# Patient Record
Sex: Female | Born: 1940 | Race: White | Hispanic: No | Smoking: Former smoker
Health system: Southern US, Community
[De-identification: ages and names within clinical notes are randomized; demographics above are authoritative.]

## PROBLEM LIST (undated history)

## (undated) DIAGNOSIS — K52831 Collagenous colitis: Secondary | ICD-10-CM

## (undated) DIAGNOSIS — I10 Essential (primary) hypertension: Secondary | ICD-10-CM

## (undated) DIAGNOSIS — B029 Zoster without complications: Secondary | ICD-10-CM

## (undated) DIAGNOSIS — K5790 Diverticulosis of intestine, part unspecified, without perforation or abscess without bleeding: Secondary | ICD-10-CM

## (undated) DIAGNOSIS — E785 Hyperlipidemia, unspecified: Secondary | ICD-10-CM

## (undated) DIAGNOSIS — I6529 Occlusion and stenosis of unspecified carotid artery: Secondary | ICD-10-CM

## (undated) DIAGNOSIS — M549 Dorsalgia, unspecified: Secondary | ICD-10-CM

## (undated) DIAGNOSIS — S8992XA Unspecified injury of left lower leg, initial encounter: Secondary | ICD-10-CM

## (undated) DIAGNOSIS — R0989 Other specified symptoms and signs involving the circulatory and respiratory systems: Secondary | ICD-10-CM

## (undated) DIAGNOSIS — E079 Disorder of thyroid, unspecified: Secondary | ICD-10-CM

## (undated) DIAGNOSIS — I739 Peripheral vascular disease, unspecified: Secondary | ICD-10-CM

## (undated) DIAGNOSIS — M1990 Unspecified osteoarthritis, unspecified site: Secondary | ICD-10-CM

## (undated) DIAGNOSIS — K219 Gastro-esophageal reflux disease without esophagitis: Secondary | ICD-10-CM

## (undated) HISTORY — DX: Unspecified osteoarthritis, unspecified site: M19.90

## (undated) HISTORY — DX: Collagenous colitis: K52.831

## (undated) HISTORY — DX: Diverticulosis of intestine, part unspecified, without perforation or abscess without bleeding: K57.90

## (undated) HISTORY — PX: CATARACT EXTRACTION, BILATERAL: SHX1313

## (undated) HISTORY — DX: Disorder of thyroid, unspecified: E07.9

## (undated) HISTORY — DX: Gastro-esophageal reflux disease without esophagitis: K21.9

## (undated) HISTORY — DX: Dorsalgia, unspecified: M54.9

## (undated) HISTORY — DX: Essential (primary) hypertension: I10

## (undated) HISTORY — DX: Zoster without complications: B02.9

## (undated) HISTORY — DX: Occlusion and stenosis of unspecified carotid artery: I65.29

## (undated) HISTORY — DX: Peripheral vascular disease, unspecified: I73.9

## (undated) HISTORY — DX: Unspecified injury of left lower leg, initial encounter: S89.92XA

## (undated) HISTORY — DX: Other specified symptoms and signs involving the circulatory and respiratory systems: R09.89

## (undated) HISTORY — PX: COLONOSCOPY: SHX174

## (undated) HISTORY — DX: Hyperlipidemia, unspecified: E78.5

---

## 1980-01-22 HISTORY — PX: ABDOMINAL HYSTERECTOMY: SHX81

## 1996-09-23 HISTORY — PX: CAROTID ENDARTERECTOMY: SUR193

## 1998-02-06 ENCOUNTER — Other Ambulatory Visit: Admission: RE | Admit: 1998-02-06 | Discharge: 1998-02-06 | Payer: Self-pay | Admitting: Dermatology

## 1998-09-05 ENCOUNTER — Other Ambulatory Visit: Admission: RE | Admit: 1998-09-05 | Discharge: 1998-09-05 | Payer: Self-pay | Admitting: *Deleted

## 1999-08-27 ENCOUNTER — Encounter: Admission: RE | Admit: 1999-08-27 | Discharge: 1999-08-27 | Payer: Self-pay | Admitting: *Deleted

## 1999-08-27 ENCOUNTER — Encounter: Payer: Self-pay | Admitting: *Deleted

## 1999-09-03 ENCOUNTER — Other Ambulatory Visit: Admission: RE | Admit: 1999-09-03 | Discharge: 1999-09-03 | Payer: Self-pay | Admitting: *Deleted

## 2000-08-27 ENCOUNTER — Encounter: Admission: RE | Admit: 2000-08-27 | Discharge: 2000-08-27 | Payer: Self-pay | Admitting: *Deleted

## 2000-08-27 ENCOUNTER — Encounter: Payer: Self-pay | Admitting: *Deleted

## 2000-09-01 ENCOUNTER — Other Ambulatory Visit: Admission: RE | Admit: 2000-09-01 | Discharge: 2000-09-01 | Payer: Self-pay | Admitting: *Deleted

## 2001-05-12 ENCOUNTER — Ambulatory Visit (HOSPITAL_COMMUNITY): Admission: RE | Admit: 2001-05-12 | Discharge: 2001-05-12 | Payer: Self-pay | Admitting: Gastroenterology

## 2001-06-02 ENCOUNTER — Encounter: Payer: Self-pay | Admitting: *Deleted

## 2001-06-02 ENCOUNTER — Encounter: Admission: RE | Admit: 2001-06-02 | Discharge: 2001-06-02 | Payer: Self-pay | Admitting: *Deleted

## 2001-10-09 ENCOUNTER — Encounter: Admission: RE | Admit: 2001-10-09 | Discharge: 2001-10-09 | Payer: Self-pay | Admitting: *Deleted

## 2001-10-09 ENCOUNTER — Encounter: Payer: Self-pay | Admitting: *Deleted

## 2002-06-09 ENCOUNTER — Encounter: Admission: RE | Admit: 2002-06-09 | Discharge: 2002-06-09 | Payer: Self-pay | Admitting: *Deleted

## 2002-06-09 ENCOUNTER — Encounter: Payer: Self-pay | Admitting: *Deleted

## 2002-10-21 ENCOUNTER — Ambulatory Visit (HOSPITAL_COMMUNITY): Admission: RE | Admit: 2002-10-21 | Discharge: 2002-10-21 | Payer: Self-pay | Admitting: Cardiology

## 2002-10-21 ENCOUNTER — Encounter: Payer: Self-pay | Admitting: Cardiology

## 2003-03-08 ENCOUNTER — Encounter: Payer: Self-pay | Admitting: Obstetrics and Gynecology

## 2003-03-08 ENCOUNTER — Encounter: Admission: RE | Admit: 2003-03-08 | Discharge: 2003-03-08 | Payer: Self-pay | Admitting: Obstetrics and Gynecology

## 2003-06-27 ENCOUNTER — Encounter: Payer: Self-pay | Admitting: *Deleted

## 2003-06-27 ENCOUNTER — Encounter: Admission: RE | Admit: 2003-06-27 | Discharge: 2003-06-27 | Payer: Self-pay | Admitting: *Deleted

## 2003-11-07 ENCOUNTER — Encounter: Admission: RE | Admit: 2003-11-07 | Discharge: 2003-11-07 | Payer: Self-pay | Admitting: Obstetrics and Gynecology

## 2004-11-21 ENCOUNTER — Encounter: Admission: RE | Admit: 2004-11-21 | Discharge: 2004-11-21 | Payer: Self-pay | Admitting: Obstetrics and Gynecology

## 2005-06-14 ENCOUNTER — Encounter: Admission: RE | Admit: 2005-06-14 | Discharge: 2005-06-14 | Payer: Self-pay | Admitting: Obstetrics and Gynecology

## 2005-12-03 ENCOUNTER — Encounter: Admission: RE | Admit: 2005-12-03 | Discharge: 2005-12-03 | Payer: Self-pay | Admitting: Cardiology

## 2006-12-09 ENCOUNTER — Encounter: Admission: RE | Admit: 2006-12-09 | Discharge: 2006-12-09 | Payer: Self-pay | Admitting: Cardiology

## 2007-02-19 ENCOUNTER — Ambulatory Visit: Payer: Self-pay | Admitting: *Deleted

## 2007-07-10 ENCOUNTER — Other Ambulatory Visit: Admission: RE | Admit: 2007-07-10 | Discharge: 2007-07-10 | Payer: Self-pay | Admitting: Obstetrics & Gynecology

## 2007-08-13 ENCOUNTER — Ambulatory Visit: Payer: Self-pay | Admitting: *Deleted

## 2007-08-17 ENCOUNTER — Encounter: Admission: RE | Admit: 2007-08-17 | Discharge: 2007-08-17 | Payer: Self-pay | Admitting: Otolaryngology

## 2007-08-27 ENCOUNTER — Other Ambulatory Visit: Admission: RE | Admit: 2007-08-27 | Discharge: 2007-08-27 | Payer: Self-pay | Admitting: Interventional Radiology

## 2007-08-27 ENCOUNTER — Encounter (INDEPENDENT_AMBULATORY_CARE_PROVIDER_SITE_OTHER): Payer: Self-pay | Admitting: Interventional Radiology

## 2007-08-27 ENCOUNTER — Encounter: Admission: RE | Admit: 2007-08-27 | Discharge: 2007-08-27 | Payer: Self-pay | Admitting: Otolaryngology

## 2007-09-04 ENCOUNTER — Encounter: Admission: RE | Admit: 2007-09-04 | Discharge: 2007-09-04 | Payer: Self-pay | Admitting: Gastroenterology

## 2007-12-14 ENCOUNTER — Encounter: Admission: RE | Admit: 2007-12-14 | Discharge: 2007-12-14 | Payer: Self-pay | Admitting: Obstetrics & Gynecology

## 2008-02-11 ENCOUNTER — Ambulatory Visit: Payer: Self-pay | Admitting: *Deleted

## 2008-07-28 ENCOUNTER — Ambulatory Visit: Payer: Self-pay | Admitting: *Deleted

## 2009-01-02 ENCOUNTER — Encounter: Admission: RE | Admit: 2009-01-02 | Discharge: 2009-01-02 | Payer: Self-pay | Admitting: Obstetrics & Gynecology

## 2009-01-26 ENCOUNTER — Ambulatory Visit: Payer: Self-pay | Admitting: *Deleted

## 2009-02-21 ENCOUNTER — Encounter: Admission: RE | Admit: 2009-02-21 | Discharge: 2009-02-21 | Payer: Self-pay | Admitting: Cardiology

## 2009-09-28 ENCOUNTER — Ambulatory Visit: Payer: Self-pay | Admitting: Vascular Surgery

## 2010-01-05 ENCOUNTER — Encounter: Admission: RE | Admit: 2010-01-05 | Discharge: 2010-01-05 | Payer: Self-pay | Admitting: Obstetrics & Gynecology

## 2010-06-13 ENCOUNTER — Ambulatory Visit: Payer: Self-pay | Admitting: Vascular Surgery

## 2010-06-25 ENCOUNTER — Ambulatory Visit: Payer: Self-pay | Admitting: Cardiology

## 2010-07-02 ENCOUNTER — Ambulatory Visit: Payer: Self-pay | Admitting: Cardiology

## 2010-10-25 ENCOUNTER — Other Ambulatory Visit (INDEPENDENT_AMBULATORY_CARE_PROVIDER_SITE_OTHER): Payer: MEDICARE

## 2010-10-25 DIAGNOSIS — E7800 Pure hypercholesterolemia, unspecified: Secondary | ICD-10-CM

## 2010-10-25 DIAGNOSIS — Z79899 Other long term (current) drug therapy: Secondary | ICD-10-CM

## 2010-10-30 ENCOUNTER — Ambulatory Visit (INDEPENDENT_AMBULATORY_CARE_PROVIDER_SITE_OTHER): Payer: MEDICARE | Admitting: Cardiology

## 2010-10-30 DIAGNOSIS — E7800 Pure hypercholesterolemia, unspecified: Secondary | ICD-10-CM

## 2010-10-30 DIAGNOSIS — Z79899 Other long term (current) drug therapy: Secondary | ICD-10-CM

## 2010-10-30 DIAGNOSIS — I10 Essential (primary) hypertension: Secondary | ICD-10-CM

## 2010-12-14 ENCOUNTER — Other Ambulatory Visit: Payer: Self-pay | Admitting: Obstetrics & Gynecology

## 2010-12-14 DIAGNOSIS — Z1231 Encounter for screening mammogram for malignant neoplasm of breast: Secondary | ICD-10-CM

## 2011-01-08 ENCOUNTER — Ambulatory Visit
Admission: RE | Admit: 2011-01-08 | Discharge: 2011-01-08 | Disposition: A | Discharge status: Home / Self Care | Payer: MEDICARE | Source: Ambulatory Visit | Attending: Obstetrics & Gynecology | Admitting: Obstetrics & Gynecology

## 2011-01-08 DIAGNOSIS — Z1231 Encounter for screening mammogram for malignant neoplasm of breast: Secondary | ICD-10-CM

## 2011-02-05 NOTE — Assessment & Plan Note (Signed)
OFFICE VISIT   [name], [name]  DOB:  1941/09/03                                       08/13/2007  ZHYQM#:[id number]   [name] was seen today at her request regarding changes in her carotid  Doppler evaluation.  She has remained asymptomatic.  No sensory, motor  or visual deficit.  She has a history of hyperlipidemia and  hypertension.  Had a right carotid endarterectomy carried out in 1998.  Doppler carried out in 01/2006 revealed velocities in left internal  carotid artery 178/45 consistent with a 60-79% stenosis.  There was a  jump up in these velocities noted in May of this year to 223/44, still  in the 60-79% range but higher in the range.  A repeat Doppler at this  time does reveal consistent findings of 214/48 in the left internal  carotid artery again in the mid range of the 60-79% stenosis.  Once  again she has been asymptomatic.   Currently on Lipitor for cholesterol control.  Taking 81 mg of aspirin  daily.   Blood pressure is 176/82, pulse 81 per minute, regular, respirations 18  per minute.   I have reassured [name] that there has been no significant change in her  carotid.  We will continue to follow this on a 6 monthly basis.   Also noted is a nodule measuring 1 x 1.1 cm in the left thyroid lobe.  She is going to follow-up with Dr. Patty Sermons regarding this.   Balinda Quails, M.D.  Electronically Signed   PGH/MEDQ  D:  08/13/2007  T:  08/13/2007  Job:  [id number]   cc:   [name], M.D.

## 2011-02-05 NOTE — Procedures (Signed)
CAROTID DUPLEX EXAM   INDICATION:  Followup carotid artery disease.   HISTORY:  Diabetes:  No.  Cardiac:  No.  Hypertension:  Yes.  Smoking:  Quit.  Previous Surgery:  Right CEA 09/10/1997 by Dr. Madilyn Fireman.  CV History:  No.  Amaurosis Fugax No, Paresthesias No, Hemiparesis No                                       RIGHT             LEFT  Brachial systolic pressure:         170               160  Brachial Doppler waveforms:         Biphasic          Biphasic  Vertebral direction of flow:        Antegrade         Antegrade  DUPLEX VELOCITIES (cm/sec)  CCA peak systolic                   M=70, D=155       M=93, D=182  ECA peak systolic                   181               157  ICA peak systolic                   185               235  ICA end diastolic                   45                49  PLAQUE MORPHOLOGY:                  Heterogenous      Heterogenous  PLAQUE AMOUNT:                      Moderate          Moderate  PLAQUE LOCATION:                    ICA/ECA/CCA       ICA/ECA/CCA   IMPRESSION:  1. Right ICA shows evidence of 40-59% stenosis status post CEA.  2. Left ICA shows evidence of 60-79% stenosis.  3. Elevated velocities in bilateral distal CCAs.  4. No significant changes from previous study.  5. Previously documented left thyroid nodule measuring today 1.13 cm x      1.20 cm which the patient has had biopsied and was found to be      benign.   ___________________________________________  P. Liliane Bade, M.D.   AS/MEDQ  D:  02/11/2008  T:  02/11/2008  Job:  [id number]

## 2011-02-05 NOTE — Procedures (Signed)
CAROTID DUPLEX EXAM   INDICATION:  Follow-up evaluation of known carotid artery disease.   HISTORY:  Diabetes:  No.  Cardiac:  No.  Hypertension:  Yes.  Smoking:  Former smoker.  Previous Surgery:  Right carotid endarterectomy on 09/10/1997 by Dr.  Madilyn Fireman.  CV History:  Previous duplex revealed a 40-59% right ICA stenosis and a  60-79% left ICA stenosis.  Patient complains of heartbeat in right ear.  Amaurosis Fugax No, Paresthesias No, Hemiparesis No                                       RIGHT             LEFT  Brachial systolic pressure:         196               190  Brachial Doppler waveforms:         Triphasic         Triphasic  Vertebral direction of flow:        Antegrade         Antegrade  DUPLEX VELOCITIES (cm/sec)  CCA peak systolic                   73                110  ECA peak systolic                   125               123  ICA peak systolic                   237               214  ICA end diastolic                   41                30  PLAQUE MORPHOLOGY:                  Soft              Mixed, irregular  PLAQUE AMOUNT:                      Moderate          Moderate  PLAQUE LOCATION:                    Proximal ICA      Proximal ICA   IMPRESSION:  1. A 40-59% right ICA stenosis status post endarterectomy (high end of      range).  2. A 40-59% left ICA stenosis (high end of range).   ___________________________________________  P. Liliane Bade, M.D.   MC/MEDQ  D:  07/28/2008  T:  07/28/2008  Job:  [id number]

## 2011-02-05 NOTE — Procedures (Signed)
CAROTID DUPLEX EXAM   INDICATION:  Followup evaluation of known carotid artery disease.   HISTORY:  Diabetes:  No.  Cardiac:  No.  Hypertension:  Yes.  Smoking:  Former smoker.  Previous Surgery:  Right carotid endarterectomy on 09/10/1997 by Dr.  Madilyn Fireman.  CV History:  Previous duplex on 07/28/2008 revealed 40-59% right ICA  stenosis status post endarterectomy and 40-59% left ICA stenosis.  Amaurosis Fugax No, Paresthesias No, Hemiparesis No                                       RIGHT             LEFT  Brachial systolic pressure:         156               154  Brachial Doppler waveforms:         Triphasic         Triphasic  Vertebral direction of flow:        Antegrade         Antegrade  DUPLEX VELOCITIES (cm/sec)  CCA peak systolic                   72                99  ECA peak systolic                   175               169  ICA peak systolic                   212               219  ICA end diastolic                   41                31  PLAQUE MORPHOLOGY:                  Soft irregular    Heterogeneous  PLAQUE AMOUNT:                      Moderate          Moderate  PLAQUE LOCATION:                    Diffuse, proximal ICA               Proximal ICA, ECA   IMPRESSION:  1. 40-59% right ICA stenosis status post endarterectomy.  2. 40-59% left ICA stenosis.  3. No significant change from previous study performed 07/28/2008.   ___________________________________________  P. Liliane Bade, M.D.   MC/MEDQ  D:  01/26/2009  T:  01/26/2009  Job:  [id number]

## 2011-02-05 NOTE — Procedures (Signed)
CAROTID DUPLEX EXAM   INDICATION:  Followup carotid disease   HISTORY:  Diabetes:  No  Cardiac:  No  Hypertension:  Yes  Smoking:  Previous  Previous Surgery:  Right carotid endarterectomy in 1998 by Dr. Madilyn Fireman  CV History:  Currently asymptomatic  Amaurosis Fugax No, Paresthesias No, Hemiparesis No                                       RIGHT             LEFT  Brachial systolic pressure:         154               142  Brachial Doppler waveforms:         Normal            Normal  Vertebral direction of flow:        Antegrade         Antegrade  DUPLEX VELOCITIES (cm/sec)  CCA peak systolic                   P = 59/D = 291    P = 84/D =187  ECA peak systolic                   304               178  ICA peak systolic                   201               178  ICA end diastolic                   43                25  PLAQUE MORPHOLOGY:                  Heterogeneous     Mixed  PLAQUE AMOUNT:                      Moderate          Moderate  PLAQUE LOCATION:                    ICA/ECA/distal CCA                  ICA/ECA/distal CCA   IMPRESSION:  1. Doppler velocities suggest 40% to 59% stenoses of the bilateral      proximal internal carotid arteries; however, these increased      velocities appear to be mostly due to the bilateral distal common      artery stenoses.  2. No significant change in Doppler velocities noted when compared to      the previous exam on 09/28/2009.  3. Incidental finding:  There is a structure measuring 1.69 x 1.6 x      1.4 cm, with a heterogeneous echotexture, noted in the right lobe      of the thyroid.   ___________________________________________  Di Kindle. Edilia Bo, M.D.   CH/MEDQ  D:  06/14/2010  T:  06/14/2010  Job:  [id number]

## 2011-02-05 NOTE — Assessment & Plan Note (Signed)
OFFICE VISIT   SILA, SARSFIELD  DOB:  April 03, 1941                                       09/28/2009  ZOXWR#:[id number]   I saw the patient in the office today for continued followup of her  carotid disease.  This is a patient that Dr. Madilyn Fireman had been following.  She is 70 years old and had undergone a right carotid endarterectomy  back in December of 1998.  He had been following a moderate recurrent  right carotid stenosis.  Since she was seen here last in May she has had  no history of stroke, TIAs, expressive or receptive aphasia or amaurosis  fugax.   PAST MEDICAL HISTORY:  Significant for hypertension which is stable on  her current medications.  In addition, she has elevated cholesterol  which is also stable on her current medications.  These are both  followed by Dr. Patty Sermons.  She denies any history of diabetes, history  of previous myocardial infarction or history of congestive heart  failure.   SOCIAL HISTORY:  She is married.  She has two children.  She is a  retired Engineer, civil (consulting).  She quit tobacco in 1977.   REVIEW OF SYSTEMS:  CARDIOVASCULAR:  She has had no chest pain, chest  pressure, palpitations or arrhythmias.  She has had no claudication,  rest pain or nonhealing ulcers.  She has had no history of stroke, TIAs  or amaurosis fugax.  She has had no history of DVT or phlebitis.  NEUROLOGIC:  She has had no dizziness, blackouts, headaches or seizures.  MUSCULOSKELETAL:  She does have a history of arthritis.   PHYSICAL EXAMINATION:  General:  This is a pleasant 70 year old woman  who appears her stated age.  Vital signs:  Her blood pressure is 144/58,  heart rate is 67, temperature is 97.8.  Lungs:  Clear bilaterally to  auscultation without rales, rhonchi or wheezing.  Cardiovascular:  She  has a right carotid bruit.  She has a regular rate and rhythm without  murmur appreciated.  She has no significant peripheral edema.  She has  palpable radial  pulses and warm and well-perfused feet.  Abdomen:  Soft  and nontender with normal pitched bowel sounds.  No masses appreciated.  Musculoskeletal:  There are no major deformities or cyanosis.  Neurologic:  She has no focal weakness or paresthesias.   I have independently interpreted her carotid duplex scan which shows a  moderate recurrent carotid stenosis on the right in the 60%-79% range.  These velocities have not changed significantly compared to the study 6  months ago.  On the left side she has a 40%-59% stenosis.  She  understands we would not consider redo right carotid endarterectomy  unless the stenosis progressed to greater than 80% or she developed new  neurologic symptoms.  I have ordered a followup duplex scan in 6 months  and I will see her back at that time to review these results.  She knows  to call sooner if she has problems.  In the meantime she knows to remain  on her aspirin.     Di Kindle. Edilia Bo, M.D.  Electronically Signed   CSD/MEDQ  D:  09/28/2009  T:  09/29/2009  Job:  [id number]   cc:   Cassell Clement, M.D.

## 2011-02-05 NOTE — Procedures (Signed)
CAROTID DUPLEX EXAM   INDICATION:  Carotid disease.   HISTORY:  Diabetes:  No.  Cardiac:  No.  Hypertension:  Yes.  Smoking:  Previous.  Previous Surgery:  Right carotid endarterectomy in 1998 by Dr. Madilyn Fireman.  CV History:  Currently asymptomatic.  Amaurosis Fugax No, Paresthesias No, Hemiparesis No                                       RIGHT             LEFT  Brachial systolic pressure:         170               154  Brachial Doppler waveforms:         Normal            Normal  Vertebral direction of flow:        Antegrade         Antegrade  DUPLEX VELOCITIES (cm/sec)  CCA peak systolic                   P=83/D=246        P=105/D=225  ECA peak systolic                   231               170  ICA peak systolic                   214               170  ICA end diastolic                   37                25  PLAQUE MORPHOLOGY:                  Heterogeneous     Mixed  PLAQUE AMOUNT:                      Moderate          Moderate  PLAQUE LOCATION:                    ICA / ECA / distal CCA              ICA / ECA / CCA   IMPRESSION:  1. Doppler velocities suggest a low end 60%-79% stenosis of the right      internal carotid artery and a 40%-59% stenosis of the left internal      carotid artery.  This increase in velocities are most likely due to      the stenosis of the bilateral distal common carotid artery /      bifurcation regions.  2. Stable bilateral carotid velocities noted when compared to the      previous exam on 01/26/2009.  3. Incidental note:  There is a 1.6 x 1.3 x 1.6 cm area of      heterogeneous echo texture noted in the left lobe of the thyroid.   ___________________________________________  Di Kindle. Edilia Bo, M.D.   CH/MEDQ  D:  09/29/2009  T:  09/30/2009  Job:  [id number]

## 2011-02-08 NOTE — Procedures (Signed)
[place]  Patient:    Maria, Pham Visit Number: [id number] MRN: [id number]          Service Type: END Location: ENDO Attending Physician:  Rich Brave Proc. Date: 05/12/01 Adm. Date:  05/12/2001   CC:         Maisie Fus A. Patty Sermons, M.D.   Procedure Report  PROCEDURE:  Colonoscopy.  SURGEON:  Florencia Reasons, M.D.  INDICATIONS:  Screening for colon cancer in a 70 year old female.  FINDINGS:  Moderate sigmoid diverticulosis, otherwise normal exam to the cecum.  DESCRIPTION OF PROCEDURE:  The nature, purpose, and risks of the procedure had been reviewed briefly with the patient who provided written consent.  Sedation was fentanyl 55 mcg and Versed 5.5 mg IV, resulting in just mild sedation without arrhythmias or desaturation.  There was some transient hypertension during the course of the exam, but this came down spontaneously as the exam progressed.  The procedure was initiated with the Olympus adult video colonoscope, but this had trouble negotiating the sigmoid region, so we switched to the adjustable tension pediatric video colonoscope, and with the patient in the supine position, right lateral decubitus position, and back in the left lateral decubitus position with external abdominal compression, we were ultimately able to reach the cecum as identified by visualization of the appendiceal orifice, and the absence of further lumen.  Pull back was then performed.  The quality of the prep was adequate and it is felt that all areas are well seen.  This was basically a normal examination except for rather substantial sigmoid diverticulosis and associated muscular thickening and spasm.  No polyps, cancer, colitis, or vascular malformations were seen.  No biopsies were obtained.  Retroflexion in the rectum was normal.  The patient tolerated the procedure well and there were no apparent complications.  IMPRESSION:  Sigmoid  diverticulosis, otherwise normal screening examination.  PLAN:  Followup colonoscopy in about five years for screening purposes. Attending Physician:  Rich Brave DD:  05/12/01 TD:  05/13/01 Job: [id number]

## 2011-02-19 ENCOUNTER — Other Ambulatory Visit: Payer: Self-pay | Admitting: Cardiology

## 2011-02-19 MED ORDER — TELMISARTAN-HCTZ 80-25 MG PO TABS: 1.0000 | ORAL_TABLET | Freq: Every day | ORAL | Status: DC

## 2011-02-19 NOTE — Telephone Encounter (Signed)
Med refill

## 2011-03-25 ENCOUNTER — Encounter: Payer: Self-pay | Admitting: Cardiology

## 2011-03-28 ENCOUNTER — Other Ambulatory Visit: Payer: Self-pay | Admitting: *Deleted

## 2011-03-28 DIAGNOSIS — E785 Hyperlipidemia, unspecified: Secondary | ICD-10-CM

## 2011-03-29 ENCOUNTER — Other Ambulatory Visit (INDEPENDENT_AMBULATORY_CARE_PROVIDER_SITE_OTHER): Payer: Medicare Other | Admitting: *Deleted

## 2011-03-29 ENCOUNTER — Other Ambulatory Visit: Payer: Self-pay | Admitting: Cardiology

## 2011-03-29 DIAGNOSIS — E785 Hyperlipidemia, unspecified: Secondary | ICD-10-CM

## 2011-03-29 LAB — HEPATIC FUNCTION PANEL
ALT: 20 U/L (ref 0–35)
AST: 21 U/L (ref 0–37)
Alkaline Phosphatase: 60 U/L (ref 39–117)
Bilirubin, Direct: 0 mg/dL (ref 0.0–0.3)
Total Bilirubin: 0.5 mg/dL (ref 0.3–1.2)
Total Protein: 7.2 g/dL (ref 6.0–8.3)

## 2011-03-29 LAB — BASIC METABOLIC PANEL
BUN: 19 mg/dL (ref 6–23)
CO2: 27 mEq/L (ref 19–32)
Calcium: 9 mg/dL (ref 8.4–10.5)
Sodium: 138 mEq/L (ref 135–145)

## 2011-03-29 LAB — LIPID PANEL
HDL: 46.8 mg/dL (ref >39.00)
Total CHOL/HDL Ratio: 4
VLDL: 50.8 mg/dL — ABNORMAL HIGH (ref 0.0–40.0)

## 2011-03-29 LAB — LDL CHOLESTEROL, DIRECT: Direct LDL: 97.4 mg/dL

## 2011-04-02 ENCOUNTER — Encounter: Payer: Self-pay | Admitting: Cardiology

## 2011-04-04 ENCOUNTER — Encounter: Payer: Self-pay | Admitting: Cardiology

## 2011-04-04 ENCOUNTER — Ambulatory Visit (INDEPENDENT_AMBULATORY_CARE_PROVIDER_SITE_OTHER): Payer: Medicare Other | Admitting: Cardiology

## 2011-04-04 DIAGNOSIS — I119 Hypertensive heart disease without heart failure: Secondary | ICD-10-CM | POA: Insufficient documentation

## 2011-04-04 DIAGNOSIS — Z8679 Personal history of other diseases of the circulatory system: Secondary | ICD-10-CM

## 2011-04-04 DIAGNOSIS — E785 Hyperlipidemia, unspecified: Secondary | ICD-10-CM

## 2011-04-04 NOTE — Progress Notes (Signed)
[name] Date of Birth:  April 27, 1941 [facility] [address]   [address] [place], [place]  [address] [phone]           Fax   [phone]  History of Present Illness: This pleasant 70 year old woman is seen for a scheduled followup office visit.  She has a history of essential hypertension and a history of hypercholesterolemia.  She also has a known right carotid bruit.  She had surgery on her right carotid in 1998.  She gets yearly Dopplers from [facility] and they have been stable.  She has not been expressing any chest pain.  She had a normal nuclear stress test on 11/14/03.  She does not have any history of congestive heart failure.  Her energy level is good and she does not get any regular exercise other than housework and going up and down stairs and in helping to care for her husband who has health problems  Current Outpatient Prescriptions  Medication Sig Dispense Refill  . amLODipine (NORVASC) 2.5 MG tablet Take 2.5 mg by mouth daily.        Marland Kitchen aspirin 81 MG EC tablet Take 81 mg by mouth daily. Taking 2 daily      . B Complex Vitamins (VITAMIN-B COMPLEX PO) Take by mouth.        Marland Kitchen CALCIUM PO Take by mouth daily.        Marland Kitchen estrogens, conjugated, (PREMARIN) 0.3 MG tablet Take 0.3 mg by mouth daily. Take daily for 21 days then do not take for 7 days.       . fish oil-omega-3 fatty acids 1000 MG capsule Take 1 g by mouth daily.        . furosemide (LASIX) 40 MG tablet Take 40 mg by mouth as needed.        Marland Kitchen levothyroxine (SYNTHROID, LEVOTHROID) 50 MCG tablet Take 75 mcg by mouth daily.       . metoprolol (TOPROL-XL) 100 MG 24 hr tablet Take 100 mg by mouth daily.        . Multiple Vitamin (MULTIVITAMIN) tablet Take 1 tablet by mouth daily.        . rosuvastatin (CRESTOR) 10 MG tablet Take 10 mg by mouth daily.        Marland Kitchen telmisartan-hydrochlorothiazide (MICARDIS HCT) 80-25 MG per tablet Take 1 tablet by mouth daily.  30 tablet  11  . verapamil (VERELAN PM)  240 MG 24 hr capsule Take 240 mg by mouth at bedtime.          Allergies  Allergen Reactions  . Ace Inhibitors   . Zebeta     Patient Active Problem List  Diagnoses  . Dyslipidemia  . Benign hypertensive heart disease without heart failure  . Personal history of carotid stenosis    History  Smoking status  . Former Smoker  . Quit date: 09/24/1975  Smokeless tobacco  . Not on file    History  Alcohol Use No    Family History  Problem Relation Age of Onset  . Heart disease Mother   . Hypertension Mother   . Arthritis Mother     Review of Systems: Constitutional: no fever chills diaphoresis or fatigue or change in weight.  Head and neck: no hearing loss, no epistaxis, no photophobia or visual disturbance. Respiratory: No cough, shortness of breath or wheezing. Cardiovascular: No chest pain peripheral edema, palpitations. Gastrointestinal: No abdominal distention, no abdominal pain, no change in bowel habits hematochezia  or melena. Genitourinary: No dysuria, no frequency, no urgency, no nocturia. Musculoskeletal:No arthralgias, no back pain, no gait disturbance or myalgias. Neurological: No dizziness, no headaches, no numbness, no seizures, no syncope, no weakness, no tremors. Hematologic: No lymphadenopathy, no easy bruising. Psychiatric: No confusion, no hallucinations, no sleep disturbance.    Physical Exam: Filed Vitals:   04/04/11 1016  BP: 136/60  Pulse: 80   The general appearance reveals a well-developed well-nourished woman in no distress.  There is a soft right carotid bruit.Pupils equal and reactive.   Extraocular Movements are full.  There is no scleral icterus.  The mouth and pharynx are normal.  The neck is supple.    The jugular venous pressure is normal.  The thyroid is not enlarged.  There is no lymphadenopathy.The chest is clear to percussion and auscultation. There are no rales or rhonchi. Expansion of the chest is symmetrical.The precordium is  quiet.  The first heart sound is normal.  The second heart sound is physiologically split.  There is no murmur gallop rub or click.  There is no abnormal lift or heave.The abdomen is soft and nontender. Bowel sounds are normal. The liver and spleen are not enlarged. There Are no abdominal masses. There are no bruits.The pedal pulses are good.  There is no phlebitis or edema.  There is no cyanosis or clubbing.Strength is normal and symmetrical in all extremities.  There is no lateralizing weakness.  There are no sensory deficits.The skin is warm and dry.  There is no rash.  Assessment / Plan: We reviewed her labs.  Continue same medication and recheck in 4 months for followup office visit and fasting lab work

## 2011-04-04 NOTE — Assessment & Plan Note (Signed)
The patient has a history of dyslipidemia.She has been on Crestor 10 mg daily .  She's not having any myalgias or side effects with the Crestor

## 2011-04-04 NOTE — Assessment & Plan Note (Signed)
Patient has a past history of essential hypertension.  She is on a multidrug regimen.  She's not having any headaches or dizzy spells.  She's not having any symptoms of congestive heart failure or angina pectoris.

## 2011-04-04 NOTE — Assessment & Plan Note (Signed)
The patient had a past history of a right carotid endarterectomy in 1998.  She is followed annually with carotid duplex scans by Dr. Cari Caraway.  Her scans have been stable.  She does have an audible bruit.  She has not had any recurrent symptoms of TIA or stroke.

## 2011-05-30 ENCOUNTER — Ambulatory Visit: Payer: Self-pay

## 2011-05-30 ENCOUNTER — Encounter: Payer: Self-pay | Admitting: Vascular Surgery

## 2011-05-30 ENCOUNTER — Other Ambulatory Visit (INDEPENDENT_AMBULATORY_CARE_PROVIDER_SITE_OTHER): Payer: Medicare Other | Admitting: *Deleted

## 2011-05-30 DIAGNOSIS — I6529 Occlusion and stenosis of unspecified carotid artery: Secondary | ICD-10-CM

## 2011-06-05 ENCOUNTER — Encounter: Payer: Self-pay | Admitting: Vascular Surgery

## 2011-06-05 NOTE — Procedures (Unsigned)
CAROTID DUPLEX EXAM  INDICATION:  Follow up carotid disease.  History of known thyroid disease.  HISTORY: Diabetes:  No. Cardiac:  No. Hypertension:  Yes. Smoking:  Previous. Previous Surgery:  Right CEA in 1998. CV History: Amaurosis Fugax No, Paresthesias No, Hemiparesis No.                                      RIGHT             LEFT Brachial systolic pressure:         112               118 Brachial Doppler waveforms:         Normal            Normal Vertebral direction of flow:        Antegrade         Antegrade DUPLEX VELOCITIES (cm/sec) CCA peak systolic                   112               99 ECA peak systolic                   274               88 ICA peak systolic                   403               209 ICA end diastolic                   71                45 PLAQUE MORPHOLOGY:                  Heterogenous      Heterogenous PLAQUE AMOUNT:                      Moderate-to-severe                  Moderate PLAQUE LOCATION:                    CCA/ECA/ICA       CCA/ECA/ICA  IMPRESSION: 1. Elevated velocities suggesting significant stenosis of the     bilateral carotid bifurcation with minimal extension of disease     into the internal carotid artery. 2. Velocities suggest a 60% to 79% stenosis.  In the right internal     carotid artery, however, this is difficult to evaluate due to     common carotid artery disease. 3. Bilateral vertebral arteries are antegrade.  ___________________________________________ Di Kindle. Edilia Bo, M.D.  LT/MEDQ  D:  05/30/2011  T:  05/30/2011  Job:  [id number]

## 2011-06-13 ENCOUNTER — Other Ambulatory Visit: Payer: Self-pay | Admitting: Vascular Surgery

## 2011-06-13 DIAGNOSIS — I6529 Occlusion and stenosis of unspecified carotid artery: Secondary | ICD-10-CM

## 2011-06-13 DIAGNOSIS — Z48812 Encounter for surgical aftercare following surgery on the circulatory system: Secondary | ICD-10-CM

## 2011-07-30 ENCOUNTER — Ambulatory Visit (INDEPENDENT_AMBULATORY_CARE_PROVIDER_SITE_OTHER): Payer: Medicare Other | Admitting: *Deleted

## 2011-07-30 DIAGNOSIS — I119 Hypertensive heart disease without heart failure: Secondary | ICD-10-CM

## 2011-07-30 DIAGNOSIS — E785 Hyperlipidemia, unspecified: Secondary | ICD-10-CM

## 2011-07-30 LAB — HEPATIC FUNCTION PANEL
ALT: 24 U/L (ref 0–35)
AST: 24 U/L (ref 0–37)
Albumin: 3.9 g/dL (ref 3.5–5.2)
Total Protein: 7.3 g/dL (ref 6.0–8.3)

## 2011-07-30 LAB — BASIC METABOLIC PANEL
BUN: 21 mg/dL (ref 6–23)
CO2: 27 mEq/L (ref 19–32)
Chloride: 104 mEq/L (ref 96–112)
Creatinine, Ser: 0.9 mg/dL (ref 0.4–1.2)
Glucose, Bld: 87 mg/dL (ref 70–99)
Potassium: 4.1 mEq/L (ref 3.5–5.1)

## 2011-07-30 LAB — LIPID PANEL
HDL: 43.4 mg/dL (ref >39.00)
LDL Cholesterol: 70 mg/dL (ref 0–99)
Total CHOL/HDL Ratio: 4
Triglycerides: 199 mg/dL — ABNORMAL HIGH (ref 0.0–149.0)
VLDL: 39.8 mg/dL (ref 0.0–40.0)

## 2011-08-05 ENCOUNTER — Ambulatory Visit: Payer: Medicare Other | Admitting: Cardiology

## 2011-08-12 ENCOUNTER — Ambulatory Visit: Payer: Medicare Other | Admitting: Cardiology

## 2011-08-20 ENCOUNTER — Encounter: Payer: Self-pay | Admitting: Cardiology

## 2011-08-20 ENCOUNTER — Ambulatory Visit (INDEPENDENT_AMBULATORY_CARE_PROVIDER_SITE_OTHER): Payer: Medicare Other | Admitting: Cardiology

## 2011-08-20 VITALS — BP 152/76 | HR 72 | Ht 63.0 in | Wt 188.8 lb

## 2011-08-20 DIAGNOSIS — E7800 Pure hypercholesterolemia, unspecified: Secondary | ICD-10-CM

## 2011-08-20 DIAGNOSIS — E785 Hyperlipidemia, unspecified: Secondary | ICD-10-CM

## 2011-08-20 DIAGNOSIS — Z8679 Personal history of other diseases of the circulatory system: Secondary | ICD-10-CM

## 2011-08-20 DIAGNOSIS — E039 Hypothyroidism, unspecified: Secondary | ICD-10-CM

## 2011-08-20 DIAGNOSIS — I119 Hypertensive heart disease without heart failure: Secondary | ICD-10-CM

## 2011-08-20 NOTE — Assessment & Plan Note (Signed)
No TIA symptoms. 

## 2011-08-20 NOTE — Progress Notes (Signed)
[name] Date of Birth:  04-13-1941 [facility] / [facility] [address]   [address] [place], [place]  [address] [phone]           Fax   [phone]  HPI: This pleasant 70 year old woman is seen for a scheduled formal followup office visit.  She has a history of essential hypertension and history of hypercholesterolemia.  She has had a previous rod endarterectomy on the right carotid.  She still has a bruit and is followed by vascular surgery.  Is not expressing any chest pain or angina.  He had a normal nuclear stress test in 2005.  Current Outpatient Prescriptions  Medication Sig Dispense Refill  . amLODipine (NORVASC) 2.5 MG tablet Take 2.5 mg by mouth daily.        Marland Kitchen aspirin 81 MG EC tablet Take 81 mg by mouth daily. Taking 2 daily      . B Complex Vitamins (VITAMIN-B COMPLEX PO) Take by mouth.        Marland Kitchen CALCIUM PO Take by mouth daily.        Marland Kitchen estrogens, conjugated, (PREMARIN) 0.3 MG tablet Take 0.3 mg by mouth daily. Take daily for 21 days then do not take for 7 days.       . fish oil-omega-3 fatty acids 1000 MG capsule Take 1 g by mouth daily.        . furosemide (LASIX) 40 MG tablet Take 40 mg by mouth as needed.        Marland Kitchen levothyroxine (SYNTHROID, LEVOTHROID) 50 MCG tablet Take 75 mcg by mouth daily.       . metoprolol (TOPROL-XL) 100 MG 24 hr tablet Take 100 mg by mouth daily.        . Multiple Vitamin (MULTIVITAMIN) tablet Take 1 tablet by mouth daily.        . rosuvastatin (CRESTOR) 10 MG tablet Take 10 mg by mouth daily.        Marland Kitchen telmisartan-hydrochlorothiazide (MICARDIS HCT) 80-25 MG per tablet Take 1 tablet by mouth daily.  30 tablet  11  . verapamil (VERELAN PM) 240 MG 24 hr capsule Take 240 mg by mouth at bedtime.          Allergies  Allergen Reactions  . Ace Inhibitors   . Zebeta     Patient Active Problem List  Diagnoses  . Dyslipidemia  . Benign hypertensive heart disease without heart failure  . Personal history of carotid stenosis      History  Smoking status  . Former Smoker  . Quit date: 09/24/1975  Smokeless tobacco  . Not on file    History  Alcohol Use No    Family History  Problem Relation Age of Onset  . Heart disease Mother   . Hypertension Mother   . Arthritis Mother     Review of Systems: The patient denies any heat or cold intolerance.  No weight gain or weight loss.  The patient denies headaches or blurry vision.  There is no cough or sputum production.  The patient denies dizziness.  There is no hematuria or hematochezia.  The patient denies any muscle aches or arthritis.  The patient denies any rash.  The patient denies frequent falling or instability.  There is no history of depression or anxiety.  All other systems were reviewed and are negative.   Physical Exam: Filed Vitals:   08/20/11 1637  BP: 152/76  Pulse: 72   general appearance reveals a well-developed well-nourished  woman in no distress.The head and neck exam reveals pupils equal and reactive.  Extraocular movements are full.  There is no scleral icterus.  The mouth and pharynx are normal.  The neck is supple.  The carotids reveal an old right carotid endarterectomy scar and she has a moderate systolic bruit over the incision.  The jugular venous pressure is normal.  The  thyroid is not enlarged.  There is no lymphadenopathy.  The chest is clear to percussion and auscultation.  There are no rales or rhonchi.  Expansion of the chest is symmetrical.  The precordium is quiet.  The first heart sound is normal.  The second heart sound is physiologically split.  There is no murmur gallop rub or click.  There is no abnormal lift or heave.  The abdomen is soft and nontender.  The bowel sounds are normal.  The liver and spleen are not enlarged.  There are no abdominal masses.  There are no abdominal bruits.  Extremities reveal good pedal pulses.  There is no phlebitis or edema.  There is no cyanosis or clubbing.  Strength is normal and symmetrical  in all extremities.  There is no lateralizing weakness.  There are no sensory deficits.  The skin is warm and dry.  There is no rash.      Assessment / Plan: Continue present medication.  Recheck in 4 months for followup office visit and fasting lab work.  Work harder on weight loss and diet.

## 2011-08-20 NOTE — Assessment & Plan Note (Signed)
The patient continues to take Mega Red daily.  I reviewed her lipids which are improving.  Triglycerides remain high but are improved

## 2011-08-20 NOTE — Assessment & Plan Note (Signed)
The patient is not having any symptoms from her blood pressure.  Denies exertional chest pain shortness of breath dizziness or syncope .

## 2011-08-20 NOTE — Patient Instructions (Signed)
Your physician recommends that you continue on your current medications as directed. Please refer to the Current Medication list given to you today.  Your physician recommends that you schedule a follow-up appointment in: 4 months with fasting labs (lp/bmet/hfp)  

## 2011-09-05 ENCOUNTER — Other Ambulatory Visit: Payer: Self-pay | Admitting: *Deleted

## 2011-09-05 MED ORDER — VERAPAMIL HCL ER 240 MG PO CP24: 240.0000 mg | ORAL_CAPSULE | Freq: Every day | ORAL | Status: DC

## 2011-09-05 NOTE — Telephone Encounter (Signed)
Refilled verapamil.

## 2011-11-11 ENCOUNTER — Other Ambulatory Visit: Payer: Self-pay | Admitting: *Deleted

## 2011-11-11 MED ORDER — ROSUVASTATIN CALCIUM 10 MG PO TABS: 10.0000 mg | ORAL_TABLET | Freq: Every day | ORAL | Status: DC

## 2011-11-11 NOTE — Telephone Encounter (Signed)
Refilled crestor 

## 2011-12-02 ENCOUNTER — Other Ambulatory Visit: Payer: Self-pay

## 2011-12-02 MED ORDER — AMLODIPINE BESYLATE 2.5 MG PO TABS: 2.5000 mg | ORAL_TABLET | Freq: Every day | ORAL | Status: DC

## 2011-12-03 ENCOUNTER — Other Ambulatory Visit: Payer: Self-pay | Admitting: Obstetrics & Gynecology

## 2011-12-03 DIAGNOSIS — Z1231 Encounter for screening mammogram for malignant neoplasm of breast: Secondary | ICD-10-CM

## 2011-12-09 ENCOUNTER — Other Ambulatory Visit: Payer: Medicare Other

## 2011-12-11 ENCOUNTER — Other Ambulatory Visit (INDEPENDENT_AMBULATORY_CARE_PROVIDER_SITE_OTHER): Payer: Medicare Other | Admitting: *Deleted

## 2011-12-11 DIAGNOSIS — I6529 Occlusion and stenosis of unspecified carotid artery: Secondary | ICD-10-CM

## 2011-12-12 ENCOUNTER — Other Ambulatory Visit (INDEPENDENT_AMBULATORY_CARE_PROVIDER_SITE_OTHER): Payer: Medicare Other

## 2011-12-12 DIAGNOSIS — E039 Hypothyroidism, unspecified: Secondary | ICD-10-CM

## 2011-12-12 DIAGNOSIS — E7800 Pure hypercholesterolemia, unspecified: Secondary | ICD-10-CM

## 2011-12-12 DIAGNOSIS — I119 Hypertensive heart disease without heart failure: Secondary | ICD-10-CM

## 2011-12-12 LAB — HEPATIC FUNCTION PANEL
ALT: 23 U/L (ref 0–35)
AST: 22 U/L (ref 0–37)
Albumin: 3.9 g/dL (ref 3.5–5.2)
Alkaline Phosphatase: 57 U/L (ref 39–117)
Total Protein: 7.2 g/dL (ref 6.0–8.3)

## 2011-12-12 LAB — BASIC METABOLIC PANEL
BUN: 17 mg/dL (ref 6–23)
Calcium: 9.2 mg/dL (ref 8.4–10.5)
Creatinine, Ser: 1 mg/dL (ref 0.4–1.2)
GFR: 57.18 mL/min — ABNORMAL LOW (ref >60.00)
Potassium: 4.4 mEq/L (ref 3.5–5.1)

## 2011-12-12 LAB — LIPID PANEL
Cholesterol: 151 mg/dL (ref 0–200)
Total CHOL/HDL Ratio: 3

## 2011-12-12 LAB — LDL CHOLESTEROL, DIRECT: Direct LDL: 86.3 mg/dL

## 2011-12-12 NOTE — Progress Notes (Signed)
Quick Note:  Please make copy of labs for patient visit. ______ 

## 2011-12-16 ENCOUNTER — Ambulatory Visit: Payer: Medicare Other | Admitting: Cardiology

## 2011-12-18 ENCOUNTER — Other Ambulatory Visit: Payer: Self-pay | Admitting: *Deleted

## 2011-12-18 DIAGNOSIS — I6529 Occlusion and stenosis of unspecified carotid artery: Secondary | ICD-10-CM

## 2011-12-18 DIAGNOSIS — Z48812 Encounter for surgical aftercare following surgery on the circulatory system: Secondary | ICD-10-CM

## 2011-12-19 ENCOUNTER — Encounter: Payer: Self-pay | Admitting: Vascular Surgery

## 2011-12-19 NOTE — Procedures (Unsigned)
CAROTID DUPLEX EXAM  INDICATION:  Followup carotid disease  HISTORY: Diabetes:  No Cardiac:  No Hypertension:  Yes Smoking:  Previous Previous Surgery:  Right CEA 1999 CV History: Amaurosis Fugax No, Paresthesias No, Hemiparesis No                                      RIGHT             LEFT Brachial systolic pressure:         122               122 Brachial Doppler waveforms:         WNL               WNL Vertebral direction of flow:        Antegrade         Antegrade DUPLEX VELOCITIES (cm/sec) CCA peak systolic                   303               133 ECA peak systolic                   580               276 ICA peak systolic                   388               370 ICA end diastolic                   71                64 PLAQUE MORPHOLOGY:                  Heterogeneous     Heterogeneous PLAQUE AMOUNT:                      Moderate to severe                  Moderate to severe PLAQUE LOCATION:                    CCA/ECA/ICA       CCA/ECA/ICA  IMPRESSION: 1. Elevated velocities of the right distal common carotid artery     suggestive of significant stenosis, however, disease appears to     have minimal extension into the internal carotid artery.  True     stenosis is difficult to evaluate due to distal common carotid     artery disease, however, velocities suggest 60%-79% stenosis. 2. 60%-79% left internal carotid artery stenosis. 3. Bilateral external carotid artery is abnormal. 4. Bilateral vertebral arteries are within normal limits.  ___________________________________________ Di Kindle. Edilia Bo, M.D.  LT/MEDQ  D:  12/13/2011  T:  12/13/2011  Job:  [id number]

## 2011-12-26 ENCOUNTER — Ambulatory Visit (INDEPENDENT_AMBULATORY_CARE_PROVIDER_SITE_OTHER): Payer: Medicare Other | Admitting: Cardiology

## 2011-12-26 ENCOUNTER — Encounter: Payer: Self-pay | Admitting: Cardiology

## 2011-12-26 VITALS — BP 140/88 | HR 78 | Ht 63.0 in | Wt 189.0 lb

## 2011-12-26 DIAGNOSIS — I119 Hypertensive heart disease without heart failure: Secondary | ICD-10-CM

## 2011-12-26 DIAGNOSIS — E7800 Pure hypercholesterolemia, unspecified: Secondary | ICD-10-CM

## 2011-12-26 DIAGNOSIS — E785 Hyperlipidemia, unspecified: Secondary | ICD-10-CM

## 2011-12-26 NOTE — Patient Instructions (Signed)
Your physician wants you to follow-up in: 4 months with Dr. Patty Sermons.  You will receive a reminder letter in the mail two months in advance. If you don't receive a letter, please call our office to schedule the follow-up appointment.  Your physician recommends that you return for lab work in: 4 months.  Liver/lipid and BMET.

## 2011-12-26 NOTE — Progress Notes (Signed)
[name] Date of Birth:  1940/12/07 [facility] [address] [place], [place]  [address] [phone]  Fax   [phone]  HPI: This pleasant 71 year old woman is seen for a four-month followup office visit.  As a history of essential hypertension and history of dyslipidemia.  She has also had exogenous obesity.  She also has a known right carotid bruit and has had previous carotid endarterectomy.  She had a recent carotid Doppler which did not show any recurrence.  She does not have any history of ischemic heart disease and she had a normal nuclear stress test in 2005.  Current Outpatient Prescriptions  Medication Sig Dispense Refill  . amLODipine (NORVASC) 2.5 MG tablet Take 1 tablet (2.5 mg total) by mouth daily.  30 tablet  6  . aspirin 81 MG EC tablet Take 81 mg by mouth daily. Taking 2 daily      . B Complex Vitamins (VITAMIN-B COMPLEX PO) Take by mouth.        Marland Kitchen CALCIUM PO Take by mouth daily.        Marland Kitchen estrogens, conjugated, (PREMARIN) 0.3 MG tablet Take 0.3 mg by mouth daily. Take daily for 21 days then do not take for 7 days.       . fish oil-omega-3 fatty acids 1000 MG capsule Take 1 g by mouth daily.        . furosemide (LASIX) 40 MG tablet Take 40 mg by mouth as needed.        Marland Kitchen levothyroxine (SYNTHROID, LEVOTHROID) 50 MCG tablet Take 75 mcg by mouth daily.       . metoprolol (TOPROL-XL) 100 MG 24 hr tablet Take 100 mg by mouth daily.        . Multiple Vitamin (MULTIVITAMIN) tablet Take 1 tablet by mouth daily.        . rosuvastatin (CRESTOR) 10 MG tablet Take 1 tablet (10 mg total) by mouth daily.  90 tablet  3  . telmisartan-hydrochlorothiazide (MICARDIS HCT) 80-25 MG per tablet Take 1 tablet by mouth daily.  30 tablet  11  . verapamil (VERELAN PM) 240 MG 24 hr capsule Take 1 capsule (240 mg total) by mouth at bedtime.  90 capsule  3    Allergies  Allergen Reactions  . Ace Inhibitors   . Zebeta     Patient Active Problem List  Diagnoses    . Dyslipidemia  . Benign hypertensive heart disease without heart failure  . Personal history of carotid stenosis    History  Smoking status  . Former Smoker  . Quit date: 09/24/1975  Smokeless tobacco  . Not on file    History  Alcohol Use No    Family History  Problem Relation Age of Onset  . Heart disease Mother   . Hypertension Mother   . Arthritis Mother     Review of Systems: The patient denies any heat or cold intolerance.  No weight gain or weight loss.  The patient denies headaches or blurry vision.  There is no cough or sputum production.  The patient denies dizziness.  There is no hematuria or hematochezia.  The patient denies any muscle aches or arthritis.  The patient denies any rash.  The patient denies frequent falling or instability.  There is no history of depression or anxiety.  All other systems were reviewed and are negative.   Physical Exam: Filed Vitals:   12/26/11 1602  BP: 140/88  Pulse: 78   the general appearance  reveals a well-developed well-nourished woman in no distress.  She has a very soft systolic bruit over the right carotid endarterectomy site.Pupils equal and reactive.   Extraocular Movements are full.  There is no scleral icterus.  The mouth and pharynx are normal.  The neck is supple.  The carotids reveal no bruits.  The jugular venous pressure is normal.  The thyroid is not enlarged.  There is no lymphadenopathy.  The chest is clear to percussion and auscultation. There are no rales or rhonchi. Expansion of the chest is symmetrical.  The precordium is quiet.  The first heart sound is normal.  The second heart sound is physiologically split.  There is no murmur gallop rub or click.  There is no abnormal lift or heave.  The abdomen is soft and nontender. Bowel sounds are normal. The liver and spleen are not enlarged. There Are no abdominal masses. There are no bruits.  Extremities no phlebitis or edema.Strength is normal and symmetrical  in all extremities.  There is no lateralizing weakness.  There are no sensory deficits.      Assessment / Plan:  Continue same medication.  Continue to work harder on exercise.  Weight loss very important.  Recheck in 4 months for followup office visit and fasting lab work

## 2011-12-26 NOTE — Assessment & Plan Note (Signed)
She has had a difficult time losing weight.  There will be a new fitness center being built at [facility] where she lives and she anticipates getting into a regular exercise program so close to home.  This will help her lose weight and will help her with her lipids as well

## 2011-12-26 NOTE — Assessment & Plan Note (Signed)
The patient has been feeling well.  She has not been experiencing any chest pain or shortness of breath.  She has not been experiencing any palpitations.  No dizziness or syncope.

## 2011-12-31 ENCOUNTER — Other Ambulatory Visit: Payer: Self-pay | Admitting: Obstetrics & Gynecology

## 2011-12-31 DIAGNOSIS — E039 Hypothyroidism, unspecified: Secondary | ICD-10-CM

## 2012-01-03 ENCOUNTER — Ambulatory Visit
Admission: RE | Admit: 2012-01-03 | Discharge: 2012-01-03 | Disposition: A | Discharge status: Home / Self Care | Payer: Medicare Other | Source: Ambulatory Visit | Attending: Obstetrics & Gynecology | Admitting: Obstetrics & Gynecology

## 2012-01-03 DIAGNOSIS — E039 Hypothyroidism, unspecified: Secondary | ICD-10-CM

## 2012-01-07 ENCOUNTER — Other Ambulatory Visit: Payer: Self-pay | Admitting: Obstetrics & Gynecology

## 2012-01-07 DIAGNOSIS — E041 Nontoxic single thyroid nodule: Secondary | ICD-10-CM

## 2012-01-14 ENCOUNTER — Ambulatory Visit
Admission: RE | Admit: 2012-01-14 | Discharge: 2012-01-14 | Disposition: A | Discharge status: Home / Self Care | Payer: Medicare Other | Source: Ambulatory Visit | Attending: Obstetrics & Gynecology | Admitting: Obstetrics & Gynecology

## 2012-01-14 DIAGNOSIS — Z1231 Encounter for screening mammogram for malignant neoplasm of breast: Secondary | ICD-10-CM

## 2012-01-15 ENCOUNTER — Ambulatory Visit
Admission: RE | Admit: 2012-01-15 | Discharge: 2012-01-15 | Disposition: A | Discharge status: Home / Self Care | Payer: Medicare Other | Source: Ambulatory Visit | Attending: Obstetrics & Gynecology | Admitting: Obstetrics & Gynecology

## 2012-01-15 ENCOUNTER — Other Ambulatory Visit (HOSPITAL_COMMUNITY)
Admission: RE | Admit: 2012-01-15 | Discharge: 2012-01-15 | Disposition: A | Discharge status: Home / Self Care | Payer: Medicare Other | Source: Ambulatory Visit | Attending: Interventional Radiology | Admitting: Interventional Radiology

## 2012-01-15 DIAGNOSIS — E041 Nontoxic single thyroid nodule: Secondary | ICD-10-CM

## 2012-01-15 DIAGNOSIS — E049 Nontoxic goiter, unspecified: Secondary | ICD-10-CM | POA: Insufficient documentation

## 2012-02-13 ENCOUNTER — Other Ambulatory Visit: Payer: Self-pay | Admitting: *Deleted

## 2012-02-13 MED ORDER — METOPROLOL SUCCINATE ER 100 MG PO TB24: 100.0000 mg | ORAL_TABLET | Freq: Every day | ORAL | Status: DC

## 2012-02-13 MED ORDER — TELMISARTAN-HCTZ 80-25 MG PO TABS: 1.0000 | ORAL_TABLET | Freq: Every day | ORAL | Status: DC

## 2012-02-20 ENCOUNTER — Ambulatory Visit (INDEPENDENT_AMBULATORY_CARE_PROVIDER_SITE_OTHER): Payer: Medicare Other | Admitting: Surgery

## 2012-02-20 ENCOUNTER — Encounter (INDEPENDENT_AMBULATORY_CARE_PROVIDER_SITE_OTHER): Payer: Self-pay | Admitting: Surgery

## 2012-02-20 VITALS — BP 124/64 | HR 64 | Temp 97.8°F | Resp 14 | Ht 63.5 in | Wt 191.4 lb

## 2012-02-20 DIAGNOSIS — E041 Nontoxic single thyroid nodule: Secondary | ICD-10-CM

## 2012-02-20 NOTE — Progress Notes (Signed)
Chief Complaint  Patient presents with  . 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 Leda Quail    HISTORY: Patient is a 71 year old white female with a known history of left thyroid nodule. This was originally diagnosed in 2008 at the time of a carotid duplex examination. Patient subsequently underwent fine needle aspiration biopsy which was benign by report. She was started on thyroid hormone medication. She currently takes Synthroid 75 mcg daily.  Followup ultrasound performed April 2013 demonstrates interval enlargement of the dominant left lobe nodule. Nodule increased from 1.2 cm to 2.1 cm in size. Repeat biopsy was performed on January 16, 2012. 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 Neoplasm cannot be excluded.  Patient has no history of thyroid disease prior to 2008. She has undergone a previous right carotid endarterectomy in 1998. There is no family history of thyroid disease and specifically no history of thyroid cancer. There is no family history of other endocrinopathy. There is no history of head or neck irradiation.  Past Medical History  Diagnosis Date  . Hyperlipidemia   . Hypertension   . Carotid bruit   . Diverticulosis   . Arthritis   . GERD (gastroesophageal reflux disease)   . Thyroid disease      Current Outpatient Prescriptions  Medication Sig Dispense Refill  . amLODipine (NORVASC) 2.5 MG tablet Take 1 tablet (2.5 mg total) by mouth daily.  30 tablet  6  . aspirin 81 MG EC tablet Take 81 mg by mouth daily. Taking 2 daily      . B Complex Vitamins (VITAMIN-B COMPLEX PO) Take by mouth.        Marland Kitchen CALCIUM PO Take by mouth daily.        Marland Kitchen estrogens, conjugated, (PREMARIN) 0.3 MG tablet Take 0.3 mg by mouth daily. Take daily for 21 days then do not take for 7 days.       . fish oil-omega-3 fatty acids 1000 MG capsule Take 1 g by mouth daily.        . furosemide (LASIX) 40 MG tablet Take 40 mg by  mouth as needed.        Marland Kitchen levothyroxine (SYNTHROID, LEVOTHROID) 50 MCG tablet Take 75 mcg by mouth daily.       . metoprolol succinate (TOPROL-XL) 100 MG 24 hr tablet Take 1 tablet (100 mg total) by mouth daily.  30 tablet  5  . Multiple Vitamin (MULTIVITAMIN) tablet Take 1 tablet by mouth daily.        . rosuvastatin (CRESTOR) 10 MG tablet Take 1 tablet (10 mg total) by mouth daily.  90 tablet  3  . telmisartan-hydrochlorothiazide (MICARDIS HCT) 80-25 MG per tablet Take 1 tablet by mouth daily.  30 tablet  5  . verapamil (VERELAN PM) 240 MG 24 hr capsule Take 1 capsule (240 mg total) by mouth at bedtime.  90 capsule  3     Allergies  Allergen Reactions  . Ace Inhibitors   . Zebeta      Family History  Problem Relation Age of Onset  . Heart disease Mother   . Hypertension Mother   . Arthritis Mother      History   Social History  . Marital Status: Married    Spouse Name: N/A    Number of Children: N/A  . Years of Education: N/A   Social History Main Topics  . Smoking status: Former Smoker  Quit date: 09/24/1975  . Smokeless tobacco: None  . Alcohol Use: 0.0 oz/week    1-2 Glasses of wine per week     1-2 drinks a week  . Drug Use: No  . Sexually Active:    Other Topics Concern  . None   Social History Narrative  . None     REVIEW OF SYSTEMS - PERTINENT POSITIVES ONLY: No dysphagia. No pain. No focal changes. No tremor. No palpitations.  EXAM: Filed Vitals:   02/20/12 1417  BP: 124/64  Pulse: 64  Temp: 97.8 F (36.6 C)  Resp: 14    HEENT: normocephalic; pupils equal and reactive; sclerae clear; dentition good; mucous membranes moist NECK:  Palpation reveals a dominant nodule in the left thyroid lobe which is somewhat firm, nontender, and mobile with swallowing; right lobe is without palpable abnormality; symmetric on extension; no palpable anterior or posterior cervical lymphadenopathy; no supraclavicular masses; no tenderness CHEST: clear to  auscultation bilaterally without rales, rhonchi, or wheezes CARDIAC: regular rate and rhythm without significant murmur; peripheral pulses are full EXT:  non-tender without edema; no deformity NEURO: no gross focal deficits; no sign of tremor   LABORATORY RESULTS: See [facility] ([facility]pic) for most recent results   RADIOLOGY RESULTS: See [facility] ([facility]pic) for most recent results   IMPRESSION: Dominant left thyroid nodule, 2.1 cm, interval increase in size and 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 Given the interval increase in size and the cytologic finding of Hurthle cells, I have recommended left thyroid lobectomy for definitive diagnosis. Patient and I discussed possible outcomes. Certainly in the event of malignancy she would require completion thyroidectomy with limited lymph node dissection. She already takes thyroid hormone replacement and her dosage may need to be increased slightly.  We discussed the procedure at length including potential complications including recurrent nerve injury. We discussed the hospital stay, the surgical incision, and the postoperative course to be anticipated. She understands and wishes to proceed.  The risks and benefits of the procedure have been discussed at length with the patient.  The patient understands the proposed procedure, potential alternative treatments, and the course of recovery to be expected.  All of the patient's questions have been answered at this time.  The patient wishes to proceed with surgery.  Velora Heckler, MD, FACS General & Endocrine Surgery [facility]   Visit Diagnoses: 1. Thyroid nodule, uninodular, left lobe     Primary Care Physician: Cassell Clement, MD, MD  GYN:  Dr. Leda Quail

## 2012-02-20 NOTE — Patient Instructions (Signed)
[facility] [phone]  THYROID & PARATHYROID SURGERY -- POST OP INSTRUCTIONS  Always review your discharge instruction sheet from the facility where your surgery was performed.  1. A prescription for pain medication may be given to you upon discharge.  Take your pain medication as prescribed, if needed.  If narcotic pain medicine is not needed, then you may take acetaminophen (Tylenol) or ibuprofen (Advil) as needed. 2. Take your usually prescribed medications unless otherwise directed. 3. If you need a refill on your pain medication, please contact your pharmacy. They will contact our office to request authorization.  Prescriptions will not be processed after 5 pm or on weekends. 4. Start with a light diet upon arrival home, such as soup and crackers, etc.  Be sure to drink pleny of fluids daily.  Resume your normal diet the day after surgery. 5. Most patients will experience some swelling and bruising on the chest and neck area.  Ice packs will help.  Swelling and bruising can take several days to resolve.  6. It is common to experience some constipation if taking pain medication after surgery.  Increasing fluid intake and taking a stool softener will usually help or prevent this problem.  A mild laxative (Milk of Magnesia or Miralax) should be taken according to package directions if there are no bowel movements after 48 hours. 7. You may remove your bandages 24-48 hours after surgery, and you may shower at that time.  You have steri-strips (small skin tapes) in place directly over the incision.  These strips should be left on the skin for 7-10 days and then removed. 8. You may resume regular (light) daily activities beginning the next day-such as daily self-care, walking, climbing stairs-gradually increasing activities as tolerated.  You may have sexual intercourse when it is comfortable.  Refrain from any heavy lifting or straining until approved by your doctor.  You may drive  when you no longer are taking prescription pain medication, you can comfortably wear a seatbelt, and you can safely maneuver your car and apply brakes. 9. You should see your doctor in the office for a follow-up appointment approximately two weeks after your surgery.  Make sure that you call for this appointment within a day or two after you arrive home to insure a convenient appointment time.  WHEN TO CALL YOUR DOCTOR: 1. Fever over 101.5 2. Inability to urinate 3. Nausea and/or vomiting - persistent 4. Extreme swelling or bruising 5. Continued bleeding from incision 6. Increased pain, redness, or drainage from the incision 7. Difficulty swallowing or breathing 8. Muscle cramping or spasms 9. Numbness or tingling in hands or feet or around lips  The clinic staff is available to answer your questions during regular business hours.  Please don't hesitate to call and ask to speak to one of the nurses if you have concerns.  [email or web address]   

## 2012-02-27 ENCOUNTER — Ambulatory Visit (HOSPITAL_COMMUNITY)
Admission: RE | Admit: 2012-02-27 | Discharge: 2012-02-27 | Disposition: A | Discharge status: Home / Self Care | Payer: Medicare Other | Source: Ambulatory Visit | Attending: Surgery | Admitting: Surgery

## 2012-02-27 ENCOUNTER — Encounter (HOSPITAL_COMMUNITY): Payer: Self-pay | Admitting: Pharmacy Technician

## 2012-02-27 ENCOUNTER — Encounter (HOSPITAL_COMMUNITY): Payer: Self-pay

## 2012-02-27 ENCOUNTER — Encounter (HOSPITAL_COMMUNITY)
Admission: RE | Admit: 2012-02-27 | Discharge: 2012-02-27 | Disposition: A | Discharge status: Home / Self Care | Payer: Medicare Other | Source: Ambulatory Visit | Attending: Surgery | Admitting: Surgery

## 2012-02-27 DIAGNOSIS — E079 Disorder of thyroid, unspecified: Secondary | ICD-10-CM

## 2012-02-27 DIAGNOSIS — E041 Nontoxic single thyroid nodule: Secondary | ICD-10-CM | POA: Insufficient documentation

## 2012-02-27 DIAGNOSIS — Z01812 Encounter for preprocedural laboratory examination: Secondary | ICD-10-CM | POA: Insufficient documentation

## 2012-02-27 DIAGNOSIS — Z01810 Encounter for preprocedural cardiovascular examination: Secondary | ICD-10-CM | POA: Insufficient documentation

## 2012-02-27 DIAGNOSIS — E039 Hypothyroidism, unspecified: Secondary | ICD-10-CM | POA: Insufficient documentation

## 2012-02-27 DIAGNOSIS — I10 Essential (primary) hypertension: Secondary | ICD-10-CM | POA: Insufficient documentation

## 2012-02-27 HISTORY — DX: Disorder of thyroid, unspecified: E07.9

## 2012-02-27 HISTORY — PX: EYE SURGERY: SHX253

## 2012-02-27 LAB — CBC
HCT: 38.8 % (ref 36.0–46.0)
MCV: 90.7 fL (ref 78.0–100.0)
RBC: 4.28 MIL/uL (ref 3.87–5.11)
WBC: 7.8 10*3/uL (ref 4.0–10.5)

## 2012-02-27 LAB — BASIC METABOLIC PANEL
BUN: 19 mg/dL (ref 6–23)
CO2: 24 mEq/L (ref 19–32)
Chloride: 100 mEq/L (ref 96–112)
Creatinine, Ser: 1.01 mg/dL (ref 0.50–1.10)
Glucose, Bld: 91 mg/dL (ref 70–99)

## 2012-02-27 NOTE — Patient Instructions (Signed)
20 [name]  02/27/2012   Your procedure is scheduled on: 6-10  -2013  Report to [facility] at   0530     AM   Call this number if you have problems the morning of surgery: [phone]   Remember:   Do not eat food:After Midnight.    Take these medicines the morning of surgery with A SIP OF WATER: Amlodipine, Estrogen, Metoprolol, Verapamil, Check with MD on Synthroid med.   Do not wear jewelry, make-up or nail polish.  Do not wear lotions, powders, or perfumes. You may wear deodorant.  Do not shave 48 hours prior to surgery.(face and neck okay, no shaving of legs)  Do not bring valuables to the hospital.  Contacts, dentures or bridgework may not be worn into surgery.  Leave suitcase in the car. After surgery it may be brought to your room.  For patients admitted to the hospital, checkout time is 11:00 AM the day of discharge.   Patients discharged the day of surgery will not be allowed to drive home.  Name and phone number of your driver: daughter  Special Instructions: CHG Shower Use Special Wash: 1/2 bottle night before surgery and 1/2 bottle morning of surgery.(avoid face and genitals)   Please read over the following fact sheets that you were given: MRSA Information, Incentive Spirometry Instruction.

## 2012-02-27 NOTE — Pre-Procedure Instructions (Signed)
02-27-12 EKG/CXR done today

## 2012-03-02 ENCOUNTER — Encounter (HOSPITAL_COMMUNITY)
Admission: RE | Disposition: A | Discharge status: Home / Self Care | Payer: Self-pay | Source: Ambulatory Visit | Attending: Surgery

## 2012-03-02 ENCOUNTER — Ambulatory Visit (HOSPITAL_COMMUNITY)
Admission: RE | Admit: 2012-03-02 | Discharge: 2012-03-03 | Disposition: A | Discharge status: Home / Self Care | Payer: Medicare Other | Source: Ambulatory Visit | Attending: Surgery | Admitting: Surgery

## 2012-03-02 ENCOUNTER — Ambulatory Visit (HOSPITAL_COMMUNITY): Payer: Medicare Other | Admitting: Registered Nurse

## 2012-03-02 ENCOUNTER — Encounter (HOSPITAL_COMMUNITY): Payer: Self-pay | Admitting: Registered Nurse

## 2012-03-02 ENCOUNTER — Encounter (HOSPITAL_COMMUNITY): Payer: Self-pay | Admitting: *Deleted

## 2012-03-02 DIAGNOSIS — D34 Benign neoplasm of thyroid gland: Secondary | ICD-10-CM | POA: Insufficient documentation

## 2012-03-02 DIAGNOSIS — E065 Other chronic thyroiditis: Secondary | ICD-10-CM | POA: Insufficient documentation

## 2012-03-02 DIAGNOSIS — Z79899 Other long term (current) drug therapy: Secondary | ICD-10-CM | POA: Insufficient documentation

## 2012-03-02 DIAGNOSIS — K219 Gastro-esophageal reflux disease without esophagitis: Secondary | ICD-10-CM | POA: Insufficient documentation

## 2012-03-02 DIAGNOSIS — E785 Hyperlipidemia, unspecified: Secondary | ICD-10-CM | POA: Insufficient documentation

## 2012-03-02 DIAGNOSIS — I10 Essential (primary) hypertension: Secondary | ICD-10-CM | POA: Insufficient documentation

## 2012-03-02 DIAGNOSIS — E041 Nontoxic single thyroid nodule: Secondary | ICD-10-CM

## 2012-03-02 DIAGNOSIS — Z7982 Long term (current) use of aspirin: Secondary | ICD-10-CM | POA: Insufficient documentation

## 2012-03-02 HISTORY — PX: THYROID LOBECTOMY: SHX420

## 2012-03-02 SURGERY — LOBECTOMY, THYROID
Anesthesia: General | Site: Neck | Laterality: Left | Wound class: Clean

## 2012-03-02 MED ORDER — ROCURONIUM BROMIDE 100 MG/10ML IV SOLN
INTRAVENOUS | Status: DC | PRN
  Administered 2012-03-02: 40 mg via INTRAVENOUS

## 2012-03-02 MED ORDER — NEOSTIGMINE METHYLSULFATE 1 MG/ML IJ SOLN
INTRAMUSCULAR | Status: DC | PRN
  Administered 2012-03-02: 4 mg via INTRAVENOUS

## 2012-03-02 MED ORDER — EPHEDRINE SULFATE 50 MG/ML IJ SOLN
INTRAMUSCULAR | Status: DC | PRN
  Administered 2012-03-02: 10 mg via INTRAVENOUS

## 2012-03-02 MED ORDER — DEXAMETHASONE SODIUM PHOSPHATE 10 MG/ML IJ SOLN
INTRAMUSCULAR | Status: DC | PRN
  Administered 2012-03-02: 10 mg via INTRAVENOUS

## 2012-03-02 MED ORDER — TELMISARTAN-HCTZ 80-25 MG PO TABS: 1.0000 | ORAL_TABLET | Freq: Every day | ORAL | Status: DC

## 2012-03-02 MED ORDER — GLYCOPYRROLATE 0.2 MG/ML IJ SOLN
INTRAMUSCULAR | Status: DC | PRN
  Administered 2012-03-02: .6 mg via INTRAVENOUS

## 2012-03-02 MED ORDER — HYDROMORPHONE HCL PF 1 MG/ML IJ SOLN
1.0000 mg | INTRAMUSCULAR | Status: DC | PRN
  Administered 2012-03-02: 1 mg via INTRAVENOUS

## 2012-03-02 MED ORDER — HYDROCODONE-ACETAMINOPHEN 5-325 MG PO TABS
1.0000 | ORAL_TABLET | ORAL | Status: DC | PRN
  Administered 2012-03-02: 1 via ORAL
  Filled 2012-03-02: qty 2

## 2012-03-02 MED ORDER — METOPROLOL SUCCINATE ER 100 MG PO TB24
100.0000 mg | ORAL_TABLET | Freq: Every day | ORAL | Status: DC
  Administered 2012-03-03: 100 mg via ORAL
  Filled 2012-03-02: qty 1

## 2012-03-02 MED ORDER — IRBESARTAN 300 MG PO TABS
300.0000 mg | ORAL_TABLET | Freq: Every day | ORAL | Status: DC
  Administered 2012-03-02 – 2012-03-03 (×2): 300 mg via ORAL
  Filled 2012-03-02 (×2): qty 1

## 2012-03-02 MED ORDER — KCL IN DEXTROSE-NACL 20-5-0.45 MEQ/L-%-% IV SOLN
INTRAVENOUS | Status: DC
  Administered 2012-03-02: 1000 mL via INTRAVENOUS
  Administered 2012-03-03: 07:00:00 via INTRAVENOUS
  Filled 2012-03-02 (×2): qty 1000

## 2012-03-02 MED ORDER — AMLODIPINE BESYLATE 2.5 MG PO TABS
2.5000 mg | ORAL_TABLET | Freq: Every day | ORAL | Status: DC
  Administered 2012-03-03: 2.5 mg via ORAL
  Filled 2012-03-02: qty 1

## 2012-03-02 MED ORDER — CEFAZOLIN SODIUM-DEXTROSE 2-3 GM-% IV SOLR
INTRAVENOUS | Status: AC
  Filled 2012-03-02: qty 50

## 2012-03-02 MED ORDER — ONDANSETRON HCL 4 MG PO TABS: 4.0000 mg | ORAL_TABLET | Freq: Four times a day (QID) | ORAL | Status: DC | PRN

## 2012-03-02 MED ORDER — LACTATED RINGERS IV SOLN
INTRAVENOUS | Status: DC | PRN
  Administered 2012-03-02: 07:00:00 via INTRAVENOUS

## 2012-03-02 MED ORDER — CEFAZOLIN SODIUM-DEXTROSE 2-3 GM-% IV SOLR
2.0000 g | INTRAVENOUS | Status: AC
  Administered 2012-03-02: 2 g via INTRAVENOUS

## 2012-03-02 MED ORDER — PROPOFOL 10 MG/ML IV EMUL
INTRAVENOUS | Status: DC | PRN
  Administered 2012-03-02: 150 mg via INTRAVENOUS

## 2012-03-02 MED ORDER — HYDROCHLOROTHIAZIDE 25 MG PO TABS
25.0000 mg | ORAL_TABLET | Freq: Every day | ORAL | Status: DC
  Administered 2012-03-02 – 2012-03-03 (×2): 25 mg via ORAL
  Filled 2012-03-02 (×2): qty 1

## 2012-03-02 MED ORDER — VERAPAMIL HCL ER 240 MG PO CP24: 240.0000 mg | ORAL_CAPSULE | Freq: Every day | ORAL | Status: DC

## 2012-03-02 MED ORDER — MIDAZOLAM HCL 5 MG/5ML IJ SOLN
INTRAMUSCULAR | Status: DC | PRN
  Administered 2012-03-02: 1 mg via INTRAVENOUS

## 2012-03-02 MED ORDER — 0.9 % SODIUM CHLORIDE (POUR BTL) OPTIME
TOPICAL | Status: DC | PRN
  Administered 2012-03-02: 1000 mL

## 2012-03-02 MED ORDER — ONDANSETRON HCL 4 MG/2ML IJ SOLN
INTRAMUSCULAR | Status: DC | PRN
  Administered 2012-03-02: 4 mg via INTRAVENOUS

## 2012-03-02 MED ORDER — FENTANYL CITRATE 0.05 MG/ML IJ SOLN
INTRAMUSCULAR | Status: DC | PRN
  Administered 2012-03-02 (×3): 50 ug via INTRAVENOUS

## 2012-03-02 MED ORDER — LEVOTHYROXINE SODIUM 75 MCG PO TABS
75.0000 ug | ORAL_TABLET | Freq: Every day | ORAL | Status: DC
  Administered 2012-03-03: 75 ug via ORAL
  Filled 2012-03-02 (×3): qty 1

## 2012-03-02 MED ORDER — PROMETHAZINE HCL 25 MG/ML IJ SOLN: 6.2500 mg | INTRAMUSCULAR | Status: DC | PRN

## 2012-03-02 MED ORDER — ONDANSETRON HCL 4 MG/2ML IJ SOLN: 4.0000 mg | Freq: Four times a day (QID) | INTRAMUSCULAR | Status: DC | PRN

## 2012-03-02 MED ORDER — LIDOCAINE HCL (CARDIAC) 20 MG/ML IV SOLN
INTRAVENOUS | Status: DC | PRN
  Administered 2012-03-02: 80 mg via INTRAVENOUS

## 2012-03-02 MED ORDER — VERAPAMIL HCL ER 240 MG PO TBCR
240.0000 mg | EXTENDED_RELEASE_TABLET | Freq: Every day | ORAL | Status: DC
  Administered 2012-03-03: 240 mg via ORAL
  Filled 2012-03-02: qty 1

## 2012-03-02 MED ORDER — HYDROMORPHONE HCL PF 1 MG/ML IJ SOLN
INTRAMUSCULAR | Status: AC
  Administered 2012-03-02: 1 mg via INTRAVENOUS
  Filled 2012-03-02: qty 1

## 2012-03-02 MED ORDER — ACETAMINOPHEN 325 MG PO TABS: 650.0000 mg | ORAL_TABLET | ORAL | Status: DC | PRN

## 2012-03-02 MED ORDER — HYDROMORPHONE HCL PF 1 MG/ML IJ SOLN: 0.2500 mg | INTRAMUSCULAR | Status: DC | PRN

## 2012-03-02 MED ORDER — KCL IN DEXTROSE-NACL 20-5-0.45 MEQ/L-%-% IV SOLN
INTRAVENOUS | Status: AC
  Administered 2012-03-02: 1000 mL via INTRAVENOUS
  Filled 2012-03-02: qty 1000

## 2012-03-02 MED ORDER — ESTROGENS CONJUGATED 0.3 MG PO TABS
0.3000 mg | ORAL_TABLET | Freq: Every day | ORAL | Status: DC
  Administered 2012-03-03: 0.3 mg via ORAL
  Filled 2012-03-02: qty 1

## 2012-03-02 SURGICAL SUPPLY — 40 items
APL SKNCLS STERI-STRIP NONHPOA (GAUZE/BANDAGES/DRESSINGS) ×1
ATTRACTOMAT 16X20 MAGNETIC DRP (DRAPES) ×2 IMPLANT
BENZOIN TINCTURE PRP APPL 2/3 (GAUZE/BANDAGES/DRESSINGS) ×2 IMPLANT
BLADE HEX COATED 2.75 (ELECTRODE) ×2 IMPLANT
BLADE SURG 15 STRL LF DISP TIS (BLADE) ×1 IMPLANT
BLADE SURG 15 STRL SS (BLADE) ×2
CANISTER SUCTION 2500CC (MISCELLANEOUS) ×2 IMPLANT
CHLORAPREP W/TINT 10.5 ML (MISCELLANEOUS) ×2 IMPLANT
CLIP TI MEDIUM 6 (CLIP) ×4 IMPLANT
CLIP TI WIDE RED SMALL 6 (CLIP) ×5 IMPLANT
CLOTH BEACON ORANGE TIMEOUT ST (SAFETY) ×2 IMPLANT
CLSR STERI-STRIP ANTIMIC 1/2X4 (GAUZE/BANDAGES/DRESSINGS) ×1 IMPLANT
DISSECTOR ROUND CHERRY 3/8 STR (MISCELLANEOUS) IMPLANT
DRAPE PED LAPAROTOMY (DRAPES) ×2 IMPLANT
DRESSING SURGICEL FIBRLLR 1X2 (HEMOSTASIS) ×1 IMPLANT
DRSG SURGICEL FIBRILLAR 1X2 (HEMOSTASIS) ×2
ELECT REM PT RETURN 9FT ADLT (ELECTROSURGICAL) ×2
ELECTRODE REM PT RTRN 9FT ADLT (ELECTROSURGICAL) ×1 IMPLANT
GAUZE SPONGE 4X4 16PLY XRAY LF (GAUZE/BANDAGES/DRESSINGS) ×2 IMPLANT
GLOVE SURG ORTHO 8.0 STRL STRW (GLOVE) ×2 IMPLANT
GOWN STRL NON-REIN LRG LVL3 (GOWN DISPOSABLE) ×2 IMPLANT
GOWN STRL REIN XL XLG (GOWN DISPOSABLE) ×4 IMPLANT
KIT BASIN OR (CUSTOM PROCEDURE TRAY) ×2 IMPLANT
NS IRRIG 1000ML POUR BTL (IV SOLUTION) ×2 IMPLANT
PACK BASIC VI WITH GOWN DISP (CUSTOM PROCEDURE TRAY) ×2 IMPLANT
PENCIL BUTTON HOLSTER BLD 10FT (ELECTRODE) ×2 IMPLANT
SHEARS HARMONIC 9CM CVD (BLADE) ×2 IMPLANT
SPONGE GAUZE 4X4 12PLY (GAUZE/BANDAGES/DRESSINGS) ×1 IMPLANT
STAPLER VISISTAT 35W (STAPLE) ×2 IMPLANT
STRIP CLOSURE SKIN 1/2X4 (GAUZE/BANDAGES/DRESSINGS) ×2 IMPLANT
SUT MNCRL AB 4-0 PS2 18 (SUTURE) ×2 IMPLANT
SUT SILK 2 0 (SUTURE) ×2
SUT SILK 2-0 18XBRD TIE 12 (SUTURE) ×1 IMPLANT
SUT SILK 3 0 (SUTURE)
SUT SILK 3-0 18XBRD TIE 12 (SUTURE) IMPLANT
SUT VIC AB 3-0 SH 18 (SUTURE) ×3 IMPLANT
SYR BULB IRRIGATION 50ML (SYRINGE) ×2 IMPLANT
TAPE CLOTH SURG 4X10 WHT LF (GAUZE/BANDAGES/DRESSINGS) ×1 IMPLANT
TOWEL OR 17X26 10 PK STRL BLUE (TOWEL DISPOSABLE) ×2 IMPLANT
YANKAUER SUCT BULB TIP 10FT TU (MISCELLANEOUS) ×2 IMPLANT

## 2012-03-02 NOTE — Anesthesia Preprocedure Evaluation (Addendum)
Anesthesia Evaluation  Patient identified by MRN, date of birth, ID band Patient awake    Reviewed: Allergy & Precautions, H&P , NPO status , Patient's Chart, lab work & pertinent test results  Airway Mallampati: II TM Distance: >3 FB Neck ROM: Full    Dental No notable dental hx.    Pulmonary neg pulmonary ROS,  CXR: NAD breath sounds clear to auscultation  Pulmonary exam normal       Cardiovascular hypertension, Pt. on medications and Pt. on home beta blockers Rhythm:Regular Rate:Normal  Dr. Yevonne Pax note of 12/26/11 reviewed. ECG: NSR, low voltage.   Neuro/Psych negative neurological ROS  negative psych ROS   GI/Hepatic Neg liver ROS,   Endo/Other  Hypothyroidism   Renal/GU negative Renal ROS  negative genitourinary   Musculoskeletal negative musculoskeletal ROS (+)   Abdominal   Peds negative pediatric ROS (+)  Hematology negative hematology ROS (+)   Anesthesia Other Findings   Reproductive/Obstetrics negative OB ROS                           Anesthesia Physical Anesthesia Plan  ASA: II  Anesthesia Plan: General   Post-op Pain Management:    Induction: Intravenous  Airway Management Planned: Oral ETT  Additional Equipment:   Intra-op Plan:   Post-operative Plan: Extubation in OR  Informed Consent: I have reviewed the patients History and Physical, chart, labs and discussed the procedure including the risks, benefits and alternatives for the proposed anesthesia with the patient or authorized representative who has indicated his/her understanding and acceptance.   Dental advisory given  Plan Discussed with: CRNA  Anesthesia Plan Comments:         Anesthesia Quick Evaluation

## 2012-03-02 NOTE — Preoperative (Signed)
Beta Blockers   Reason not to administer Beta Blockers:Pt took Metoprolol this am 03-02-12

## 2012-03-02 NOTE — Anesthesia Postprocedure Evaluation (Signed)
  Anesthesia Post-op Note  Patient: [name]  Procedure(s) Performed: Procedure(s) (LRB): THYROID LOBECTOMY (Left)  Patient Location: PACU  Anesthesia Type: General  Level of Consciousness: awake and alert   Airway and Oxygen Therapy: Patient Spontanous Breathing  Post-op Pain: mild  Post-op Assessment: Post-op Vital signs reviewed, Patient's Cardiovascular Status Stable, Respiratory Function Stable, Patent Airway and No signs of Nausea or vomiting  Post-op Vital Signs: stable  Complications: No apparent anesthesia complications

## 2012-03-02 NOTE — Progress Notes (Signed)
Pt states she had a diarrhea stool this am

## 2012-03-02 NOTE — H&P (View-Only) (Signed)
Chief Complaint  Patient presents with  . 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 Suzanne Miller    HISTORY: Patient is a 71-year-old white female with a known history of left thyroid nodule. This was originally diagnosed in 2008 at the time of a carotid duplex examination. Patient subsequently underwent fine needle aspiration biopsy which was benign by report. She was started on thyroid hormone medication. She currently takes Synthroid 75 mcg daily.  Followup ultrasound performed April 2013 demonstrates interval enlargement of the dominant left lobe nodule. Nodule increased from 1.2 cm to 2.1 cm in size. Repeat biopsy was performed on January 16, 2012. 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 Neoplasm cannot be excluded.  Patient has no history of thyroid disease prior to 2008. She has undergone a previous right carotid endarterectomy in 1998. There is no family history of thyroid disease and specifically no history of thyroid cancer. There is no family history of other endocrinopathy. There is no history of head or neck irradiation.  Past Medical History  Diagnosis Date  . Hyperlipidemia   . Hypertension   . Carotid bruit   . Diverticulosis   . Arthritis   . GERD (gastroesophageal reflux disease)   . Thyroid disease      Current Outpatient Prescriptions  Medication Sig Dispense Refill  . amLODipine (NORVASC) 2.5 MG tablet Take 1 tablet (2.5 mg total) by mouth daily.  30 tablet  6  . aspirin 81 MG EC tablet Take 81 mg by mouth daily. Taking 2 daily      . B Complex Vitamins (VITAMIN-B COMPLEX PO) Take by mouth.        . CALCIUM PO Take by mouth daily.        . estrogens, conjugated, (PREMARIN) 0.3 MG tablet Take 0.3 mg by mouth daily. Take daily for 21 days then do not take for 7 days.       . fish oil-omega-3 fatty acids 1000 MG capsule Take 1 g by mouth daily.        . furosemide (LASIX) 40 MG tablet Take 40 mg by  mouth as needed.        . levothyroxine (SYNTHROID, LEVOTHROID) 50 MCG tablet Take 75 mcg by mouth daily.       . metoprolol succinate (TOPROL-XL) 100 MG 24 hr tablet Take 1 tablet (100 mg total) by mouth daily.  30 tablet  5  . Multiple Vitamin (MULTIVITAMIN) tablet Take 1 tablet by mouth daily.        . rosuvastatin (CRESTOR) 10 MG tablet Take 1 tablet (10 mg total) by mouth daily.  90 tablet  3  . telmisartan-hydrochlorothiazide (MICARDIS HCT) 80-25 MG per tablet Take 1 tablet by mouth daily.  30 tablet  5  . verapamil (VERELAN PM) 240 MG 24 hr capsule Take 1 capsule (240 mg total) by mouth at bedtime.  90 capsule  3     Allergies  Allergen Reactions  . Ace Inhibitors   . Zebeta      Family History  Problem Relation Age of Onset  . Heart disease Mother   . Hypertension Mother   . Arthritis Mother      History   Social History  . Marital Status: Married    Spouse Name: N/A    Number of Children: N/A  . Years of Education: N/A   Social History Main Topics  . Smoking status: Former Smoker      Quit date: 09/24/1975  . Smokeless tobacco: None  . Alcohol Use: 0.0 oz/week    1-2 Glasses of wine per week     1-2 drinks a week  . Drug Use: No  . Sexually Active:    Other Topics Concern  . None   Social History Narrative  . None     REVIEW OF SYSTEMS - PERTINENT POSITIVES ONLY: No dysphagia. No pain. No focal changes. No tremor. No palpitations.  EXAM: Filed Vitals:   02/20/12 1417  BP: 124/64  Pulse: 64  Temp: 97.8 F (36.6 C)  Resp: 14    HEENT: normocephalic; pupils equal and reactive; sclerae clear; dentition good; mucous membranes moist NECK:  Palpation reveals a dominant nodule in the left thyroid lobe which is somewhat firm, nontender, and mobile with swallowing; right lobe is without palpable abnormality; symmetric on extension; no palpable anterior or posterior cervical lymphadenopathy; no supraclavicular masses; no tenderness CHEST: clear to  auscultation bilaterally without rales, rhonchi, or wheezes CARDIAC: regular rate and rhythm without significant murmur; peripheral pulses are full EXT:  non-tender without edema; no deformity NEURO: no gross focal deficits; no sign of tremor   LABORATORY RESULTS: See [facility] (CHL-Epic) for most recent results   RADIOLOGY RESULTS: See [facility] (CHL-Epic) for most recent results   IMPRESSION: Dominant left thyroid nodule, 2.1 cm, interval increase in size and 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 Given the interval increase in size and the cytologic finding of Hurthle cells, I have recommended left thyroid lobectomy for definitive diagnosis. Patient and I discussed possible outcomes. Certainly in the event of malignancy she would require completion thyroidectomy with limited lymph node dissection. She already takes thyroid hormone replacement and her dosage may need to be increased slightly.  We discussed the procedure at length including potential complications including recurrent nerve injury. We discussed the hospital stay, the surgical incision, and the postoperative course to be anticipated. She understands and wishes to proceed.  The risks and benefits of the procedure have been discussed at length with the patient.  The patient understands the proposed procedure, potential alternative treatments, and the course of recovery to be expected.  All of the patient's questions have been answered at this time.  The patient wishes to proceed with surgery.  Cheryal Salas M. Alyla Pietila, MD, FACS General & Endocrine Surgery [facility]   Visit Diagnoses: 1. Thyroid nodule, uninodular, left lobe     Primary Care Physician: Thomas Brackbill, MD, MD  GYN:  Dr. Suzanne Miller  

## 2012-03-02 NOTE — Brief Op Note (Signed)
03/02/2012  8:42 AM  PATIENT:  [name]  71 y.o. 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   Delay start of Pharmacological VTE agent (>24hrs) due to surgical blood loss or risk of bleeding: yes  Velora Heckler, MD, FACS General & Endocrine Surgery [facility] Office: [phone]

## 2012-03-02 NOTE — Op Note (Signed)
[name], [name] NO.:  [id number]  MEDICAL RECORD NO.:  [id number]  LOCATION:  [facility]                         FACILITY:  [facility]  PHYSICIAN:  Velora Heckler, MD      DATE OF BIRTH:  01-31-1941  DATE OF PROCEDURE:  03/02/2012                               OPERATIVE REPORT   PREOPERATIVE DIAGNOSIS:  Left thyroid nodule with Hurthle cell change.  POSTOPERATIVE DIAGNOSIS:  Left thyroid nodule with Hurthle cell change.  PROCEDURE:  Left thyroid lobectomy.  SURGEON:  Velora Heckler, M.D., FACS  ANESTHESIA:  General per Quentin Cornwall. Council Mechanic, M.D.  ESTIMATED BLOOD LOSS:  Minimal.  PREPARATION:  ChloraPrep.  COMPLICATIONS:  None.  INDICATIONS:  The patient is a 71 year old white female, originally diagnosed with thyroid nodule in 2008 at the time of carotid duplex examination.  The patient was started on thyroid hormone medication and currently takes 75 mcg of Synthroid daily.  Follow up ultrasound in April 2013 showed interval enlargement of the dominant left lobe nodule. It did increase from 1.2 cm to 2.1 cm in size.  Fine-needle aspiration biopsy on January 16, 2012 showed a follicular lesion with Hurthle cell changes.  The patient now comes to surgery for resection for definitive diagnosis.  BODY OF REPORT:  The procedure was done in OR #6 at the [facility].  The patient was brought to the operating room, placed in a supine position on the operating room table.  Following administration of general anesthesia, the patient was positioned and then prepped and draped in usual strict aseptic fashion.  After ascertaining that an adequate level of anesthesia been achieved, a Kocher incision was made with a #15 blade.  Dissection was carried through subcutaneous tissues and platysma.  Hemostasis was obtained with electrocautery.  Skin flaps were elevated cephalad and caudad from the thyroid notch to the sternal notch.  A Mahorner self-retaining  retractor was placed for exposure.  Strap muscles were incised in the midline. Dissection was begun on the left side.  Strap muscles were reflected laterally.  They were somewhat adherent to the underlying thyroid lobe. Lobe was normal in size, but there was a dominant nodule in the mid and upper portion of the left thyroid lobe.  Lobe was gently mobilized. Venous tributaries were divided between small and medium Ligaclips. Superior pole vessels were dissected out individually and divided between Ligaclips with a Harmonic Scalpel.  Inferior venous tributaries were divided between medium Ligaclips with the Harmonic Scalpel.  Gland was rolled anteriorly.  Recurrent laryngeal nerve and parathyroid tissue was identified and preserved.  Ligament of Allyson Sabal was released with the electrocautery and the gland was mobilized onto the trachea.  Isthmus was mobilized across the midline.  There was no significant pyramidal lobe.  Isthmus was transected at its junction with the right thyroid lobe with the Harmonic Scalpel, used for hemostasis.  Sutures used to mark the left superior pole.  The entire left thyroid lobe was submitted to pathology for review.  Neck was irrigated with warm saline and hemostasis was achieved with the electrocautery.  Surgicel was placed throughout the operative field. Palpation of the right thyroid lobe shows it  to be small, soft, normal in size without palpable nodules.  There was no lymphadenopathy.  Strap muscles were reapproximated in the midline with interrupted 3-0 Vicryl sutures.  Platysma was closed with interrupted 3-0 Vicryl sutures.  Skin was closed with a running 4-0 Monocryl subcuticular suture.  Wound was washed and dried and benzoin and Steri-Strips were applied.  Sterile dressings were applied.  The patient was awakened from anesthesia and brought to the recovery room.  The patient tolerated the procedure well.  Velora Heckler, MD, FACS General & Endocrine  Surgery [facility] Office: [phone]   TMG/MEDQ  D:  03/02/2012  T:  03/02/2012  Job:  [id number]  cc:   Cassell Clement, M.D. Fax: [phone]  Dr. Leda Quail

## 2012-03-02 NOTE — Interval H&P Note (Signed)
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  The various methods of treatment have been discussed with the patient and family. After consideration of risks, benefits and other options for treatment, the patient has consented to    Procedure(s) (LRB): THYROID LOBECTOMY (Left) as a surgical intervention .    The patients' history has been reviewed, patient examined, no change in status, stable for surgery.  I have reviewed the patients' chart and labs.  Questions were answered to the patient's satisfaction.    Velora Heckler, MD, FACS General & Endocrine Surgery [facility] Office: [phone]  [name]

## 2012-03-02 NOTE — Transfer of Care (Signed)
Immediate Anesthesia Transfer of Care Note  Patient: [name]  Procedure(s) Performed: Procedure(s) (LRB): THYROID LOBECTOMY (Left)  Patient Location: PACU  Anesthesia Type: General  Level of Consciousness: awake, alert , oriented and patient cooperative  Airway & Oxygen Therapy: Patient Spontanous Breathing and Patient connected to face mask oxygen  Post-op Assessment: Report given to PACU RN, Post -op Vital signs reviewed and stable and Patient moving all extremities  Post vital signs: Reviewed and stable  Complications: No apparent anesthesia complications

## 2012-03-03 MED ORDER — HYDROCODONE-ACETAMINOPHEN 5-325 MG PO TABS: 1.0000 | ORAL_TABLET | ORAL | Status: AC | PRN

## 2012-03-03 NOTE — Discharge Summary (Signed)
Physician Discharge Summary [facility]  Patient ID: [name] MRN: [id number] DOB/AGE: 1941/04/10 71 y.o.  Admit date: 03/02/2012 Discharge date: 03/03/2012  Admission Diagnoses:  Thyroid nodule with atypia (Hurthle cell changes)  Discharge Diagnoses:  Active Problems:  * No active hospital problems. *    Discharged Condition: good  Hospital Course: patient with left thyroid nodule with Hurthle cell change on FNA.  Interval enlargement in size.  To operating room for left thyroid lobectomy.  Path pending.  Patient did well overnight with good pain control.  Prepared for discharge home POD#1.  Consults: None  Significant Diagnostic Studies: none  Treatments: surgery: left thyroid lobectomy  Discharge Exam: Blood pressure 123/62, pulse 83, temperature 97.5 F (36.4 C), temperature source Oral, resp. rate 16, height 5\' 3"  (1.6 m), weight 187 lb (84.823 kg), SpO2 92.00%. HEENT - clear Neck - mild edema; wound clear and dry; voice normal  Disposition: Home with family  Discharge Orders    Future Appointments: Provider: Department: Dept Phone: Center:   03/16/2012 2:45 PM Velora Heckler, MD [facility] [phone] None   04/28/2012 8:30 AM [facility] [phone] [facility]   04/30/2012 9:00 AM Cassell Clement, MD [facility] [phone] None   06/10/2012 3:00 PM [facility] [phone] [facility]   06/10/2012 4:00 PM Chuck Hint, MD [facility] [phone] [facility]     Future Orders Please Complete By Expires   Diet - low sodium heart healthy      Increase activity slowly      Discharge instructions      Comments:   [facility] [place] [phone]  THYROID & PARATHYROID SURGERY -- POST OP INSTRUCTIONS  Always review your discharge instruction sheet from the facility where your surgery was performed.  A prescription for pain medication may be given to you upon discharge.  Take your  pain medication as prescribed, if needed.  If narcotic pain medicine is not needed, then you may take acetaminophen (Tylenol) or ibuprofen (Advil) as needed. Take your usually prescribed medications unless otherwise directed. If you need a refill on your pain medication, please contact your pharmacy. They will contact our office to request authorization.  Prescriptions will not be processed after 5 pm or on weekends. Start with a light diet upon arrival home, such as soup and crackers, etc.  Be sure to drink pleny of fluids daily.  Resume your normal diet the day after surgery. Most patients will experience some swelling and bruising on the chest and neck area.  Ice packs will help.  Swelling and bruising can take several days to resolve.  It is common to experience some constipation if taking pain medication after surgery.  Increasing fluid intake and taking a stool softener will usually help or prevent this problem.  A mild laxative (Milk of Magnesia or Miralax) should be taken according to package directions if there are no bowel movements after 48 hours. You may remove your bandages 24-48 hours after surgery, and you may shower at that time.  You have steri-strips (small skin tapes) in place directly over the incision.  These strips should be left on the skin for 7-10 days and then removed. You may resume regular (light) daily activities beginning the next day--such as daily self-care, walking, climbing stairs--gradually increasing activities as tolerated.  You may have sexual intercourse when it is comfortable.  Refrain from any heavy lifting or straining until approved by your doctor.  You may drive when  you no longer are taking prescription pain medication, you can comfortably wear a seatbelt, and you can safely maneuver your car and apply brakes. You should see your doctor in the office for a follow-up appointment approximately two weeks after your surgery.  Make sure that you call for this  appointment within a day or two after you arrive home to insure a convenient appointment time.  WHEN TO CALL YOUR DOCTOR: Fever over 101.5 Inability to urinate Nausea and/or vomiting - persistent Extreme swelling or bruising Continued bleeding from incision Increased pain, redness, or drainage from the incision Difficulty swallowing or breathing Muscle cramping or spasms Numbness or tingling in hands or feet or around lips  The clinic staff is available to answer your questions during regular business hours.  Please don't hesitate to call and ask to speak to one of the nurses if you have concerns.  [email or web address]    Remove dressing in 24 hours      Comments:   May shower on Wednesday.     Medication List  As of 03/03/2012 11:11 AM   TAKE these medications         amLODipine 2.5 MG tablet   Commonly known as: NORVASC   Take 2.5 mg by mouth daily with breakfast.      aspirin 81 MG EC tablet   Take 81 mg by mouth daily with breakfast. Taking 2 daily      CALCIUM PO   Take by mouth daily with breakfast. 600mg       furosemide 40 MG tablet   Commonly known as: LASIX   Take 40 mg by mouth as needed. For edema      HYDROcodone-acetaminophen 5-325 MG per tablet   Commonly known as: NORCO   Take 1-2 tablets by mouth every 4 (four) hours as needed.      levothyroxine 75 MCG tablet   Commonly known as: SYNTHROID, LEVOTHROID   Take 75 mcg by mouth daily with breakfast.      metoprolol succinate 100 MG 24 hr tablet   Commonly known as: TOPROL-XL   Take 100 mg by mouth daily with breakfast.      multivitamin tablet   Take 1 tablet by mouth daily.      OVER THE COUNTER MEDICATION   Take 1 tablet by mouth daily with breakfast. Mega Red.      PREMARIN 0.3 MG tablet   Generic drug: estrogens (conjugated)   Take 0.3 mg by mouth daily with breakfast. Take daily for 21 days then do not take for 7 days.      rosuvastatin 10 MG tablet   Commonly known as: CRESTOR    Take 10 mg by mouth at bedtime.      telmisartan-hydrochlorothiazide 80-25 MG per tablet   Commonly known as: MICARDIS HCT   Take 1 tablet by mouth daily with breakfast.      verapamil 240 MG 24 hr capsule   Commonly known as: VERELAN PM   Take 240 mg by mouth daily with breakfast.      VITAMIN-B COMPLEX PO   Take by mouth daily with breakfast.           Velora Heckler, MD, FACS General & Endocrine Surgery [facility] Office: [phone]   Signed: Velora Heckler 03/03/2012, 11:11 AM

## 2012-03-04 ENCOUNTER — Encounter (HOSPITAL_COMMUNITY): Payer: Self-pay | Admitting: Surgery

## 2012-03-04 NOTE — Progress Notes (Signed)
Quick Note:  Please contact patient and notify of benign pathology results.  [name] M. Maria Bonet, MD, FACS [facility] Office: [phone]   ______ 

## 2012-03-05 ENCOUNTER — Telehealth (INDEPENDENT_AMBULATORY_CARE_PROVIDER_SITE_OTHER): Payer: Self-pay

## 2012-03-05 NOTE — Telephone Encounter (Signed)
Pt notified of path result. 

## 2012-03-11 ENCOUNTER — Other Ambulatory Visit (HOSPITAL_COMMUNITY): Payer: Self-pay | Admitting: *Deleted

## 2012-03-11 MED ORDER — METOPROLOL SUCCINATE ER 100 MG PO TB24: 100.0000 mg | ORAL_TABLET | Freq: Every day | ORAL | Status: DC

## 2012-03-11 NOTE — Telephone Encounter (Signed)
Refilled metoprolol 

## 2012-03-16 ENCOUNTER — Encounter (INDEPENDENT_AMBULATORY_CARE_PROVIDER_SITE_OTHER): Payer: Self-pay | Admitting: Surgery

## 2012-03-16 ENCOUNTER — Ambulatory Visit (INDEPENDENT_AMBULATORY_CARE_PROVIDER_SITE_OTHER): Payer: Medicare Other | Admitting: Surgery

## 2012-03-16 VITALS — BP 128/66 | HR 68 | Temp 97.4°F | Resp 16 | Ht 63.5 in | Wt 187.4 lb

## 2012-03-16 DIAGNOSIS — E041 Nontoxic single thyroid nodule: Secondary | ICD-10-CM

## 2012-03-16 NOTE — Patient Instructions (Signed)
  COCOA BUTTER & VITAMIN E CREAM  ([employer]mer's or other brand)  Apply cocoa butter/vitamin E cream to your incision 2 - 3 times daily.  Massage cream into incision for one minute with each application.  Use sunscreen (50 SPF or higher) for first 6 months after surgery if area is exposed to sun.  You may substitute Mederma or other scar reducing creams as desired.   

## 2012-03-16 NOTE — Progress Notes (Signed)
General Surgery [facility]  Visit Diagnoses: 1. Thyroid nodule, uninodular, left lobe     HISTORY: The patient is a 71 year old white female who underwent thyroid lobectomy for thyroid nodule with Hrthle cell change. Final pathology shows a 2.2 cm adenoma. There was no evidence of malignancy.  EXAM: Wound is well-healed. Mild soft tissue swelling. Voice quality is normal. No sign of infection.  IMPRESSION: Hurthle cell adenoma, 2.2 cm  PLAN: Patient will begin applying topical creams to her incision.  We will check a TSH level in 3 weeks.  She will return in 6 weeks for final wound check.  Velora Heckler, MD, FACS General & Endocrine Surgery [facility]

## 2012-04-16 ENCOUNTER — Other Ambulatory Visit (INDEPENDENT_AMBULATORY_CARE_PROVIDER_SITE_OTHER): Payer: Self-pay | Admitting: Surgery

## 2012-04-16 LAB — TSH: TSH: 9.526 u[IU]/mL — ABNORMAL HIGH (ref 0.350–4.500)

## 2012-04-17 ENCOUNTER — Telehealth (INDEPENDENT_AMBULATORY_CARE_PROVIDER_SITE_OTHER): Payer: Self-pay

## 2012-04-17 NOTE — Telephone Encounter (Signed)
Labs here and tsh abn. Sent to Dr Gerrit Friends to review. He will be back on 04-27-12.

## 2012-04-24 ENCOUNTER — Ambulatory Visit (INDEPENDENT_AMBULATORY_CARE_PROVIDER_SITE_OTHER): Payer: Medicare Other | Admitting: *Deleted

## 2012-04-24 DIAGNOSIS — I119 Hypertensive heart disease without heart failure: Secondary | ICD-10-CM

## 2012-04-24 DIAGNOSIS — E785 Hyperlipidemia, unspecified: Secondary | ICD-10-CM

## 2012-04-24 DIAGNOSIS — Z8679 Personal history of other diseases of the circulatory system: Secondary | ICD-10-CM

## 2012-04-24 LAB — BASIC METABOLIC PANEL
BUN: 15 mg/dL (ref 6–23)
CO2: 27 mEq/L (ref 19–32)
Chloride: 102 mEq/L (ref 96–112)
Creatinine, Ser: 1 mg/dL (ref 0.4–1.2)

## 2012-04-24 LAB — HEPATIC FUNCTION PANEL
ALT: 22 U/L (ref 0–35)
AST: 22 U/L (ref 0–37)
Albumin: 4 g/dL (ref 3.5–5.2)

## 2012-04-24 LAB — LIPID PANEL
Cholesterol: 168 mg/dL (ref 0–200)
Triglycerides: 219 mg/dL — ABNORMAL HIGH (ref 0.0–149.0)
VLDL: 43.8 mg/dL — ABNORMAL HIGH (ref 0.0–40.0)

## 2012-04-28 ENCOUNTER — Other Ambulatory Visit: Payer: Medicare Other

## 2012-04-30 ENCOUNTER — Encounter: Payer: Self-pay | Admitting: Cardiology

## 2012-04-30 ENCOUNTER — Ambulatory Visit (INDEPENDENT_AMBULATORY_CARE_PROVIDER_SITE_OTHER): Payer: Medicare Other | Admitting: Cardiology

## 2012-04-30 VITALS — BP 110/68 | HR 80 | Ht 63.0 in | Wt 185.0 lb

## 2012-04-30 DIAGNOSIS — Z8679 Personal history of other diseases of the circulatory system: Secondary | ICD-10-CM

## 2012-04-30 DIAGNOSIS — E7800 Pure hypercholesterolemia, unspecified: Secondary | ICD-10-CM

## 2012-04-30 DIAGNOSIS — I119 Hypertensive heart disease without heart failure: Secondary | ICD-10-CM

## 2012-04-30 DIAGNOSIS — E785 Hyperlipidemia, unspecified: Secondary | ICD-10-CM

## 2012-04-30 DIAGNOSIS — E041 Nontoxic single thyroid nodule: Secondary | ICD-10-CM

## 2012-04-30 MED ORDER — ROSUVASTATIN CALCIUM 20 MG PO TABS: 20.0000 mg | ORAL_TABLET | Freq: Every day | ORAL | Status: DC

## 2012-04-30 NOTE — Assessment & Plan Note (Signed)
The patient has not been having any chest pain or shortness of breath 

## 2012-04-30 NOTE — Progress Notes (Signed)
[name] Date of Birth:  August 20, 1941 [facility] [address] [place], [place]  [address] [phone]  Fax   [phone]  HPI: This pleasant 71 year old woman is seen for a scheduled four-month followup office visit.  She has a past history of dyslipidemia.  She has a history of known residual right carotid bruit after remote right carotid endarterectomy.  She's had a recent carotid Doppler which showed no recurrent stenosis.  She does not have any history of ischemic heart disease.  She had a normal nuclear stress test in 2005.  She has had essential hypertension and exogenous obesity.  She is hypothyroid on levothyroxine.  She had a recent partial thyroidectomy by Dr. Gerrit Friends.  He will be following her TSH is for the short-term and anticipates that her requirements for Synthroid will increase.  Current Outpatient Prescriptions  Medication Sig Dispense Refill  . amLODipine (NORVASC) 2.5 MG tablet Take 2.5 mg by mouth daily with breakfast.      . aspirin 81 MG EC tablet Take 81 mg by mouth daily with breakfast. Taking 2 daily      . B Complex Vitamins (VITAMIN-B COMPLEX PO) Take by mouth daily with breakfast.       . CALCIUM PO Take by mouth daily with breakfast. 600mg       . estrogens, conjugated, (PREMARIN) 0.3 MG tablet Take 0.3 mg by mouth daily with breakfast. Take daily for 21 days then do not take for 7 days.      . furosemide (LASIX) 40 MG tablet Take 40 mg by mouth as needed. For edema      . levothyroxine (SYNTHROID, LEVOTHROID) 75 MCG tablet Take 75 mcg by mouth daily with breakfast.      . metoprolol succinate (TOPROL-XL) 100 MG 24 hr tablet Take 1 tablet (100 mg total) by mouth daily with breakfast.  30 tablet  11  . Multiple Vitamin (MULTIVITAMIN) tablet Take 1 tablet by mouth daily.        Marland Kitchen OVER THE COUNTER MEDICATION Take 1 tablet by mouth daily with breakfast. Mega Red.      . rosuvastatin (CRESTOR) 20 MG tablet Take 1 tablet (20 mg total) by  mouth at bedtime.  90 tablet  3  . telmisartan-hydrochlorothiazide (MICARDIS HCT) 80-25 MG per tablet Take 1 tablet by mouth daily with breakfast.      . verapamil (VERELAN PM) 240 MG 24 hr capsule Take 240 mg by mouth daily with breakfast.      . DISCONTD: rosuvastatin (CRESTOR) 10 MG tablet Take 10 mg by mouth at bedtime.         Allergies  Allergen Reactions  . Ace Inhibitors     Reaction=cough  . Zebeta     Reaction does not work per patient    Patient Active Problem List  Diagnosis  . Dyslipidemia  . Benign hypertensive heart disease without heart failure  . Personal history of carotid stenosis  . Thyroid nodule, uninodular, left lobe    History  Smoking status  . Former Smoker  . Quit date: 09/24/1975  Smokeless tobacco  . Not on file    History  Alcohol Use  . 0.0 oz/week  . 1-2 Glasses of wine per week    1-2 drinks a week    Family History  Problem Relation Age of Onset  . Heart disease Mother   . Hypertension Mother   . Arthritis Mother     Review of Systems: The patient denies any  heat or cold intolerance.  No weight gain or weight loss.  The patient denies headaches or blurry vision.  There is no cough or sputum production.  The patient denies dizziness.  There is no hematuria or hematochezia.  The patient denies any muscle aches or arthritis.  The patient denies any rash.  The patient denies frequent falling or instability.  There is no history of depression or anxiety.  All other systems were reviewed and are negative.   Physical Exam: Filed Vitals:   04/30/12 0900  BP: 110/68  Pulse: 80   the general appearance reveals a well-developed well-nourished Caucasian female in no distress.The head and neck exam reveals pupils equal and reactive.  Extraocular movements are full.  There is no scleral icterus.  The mouth and pharynx are normal.  The neck is supple.  The carotids reveal a soft right carotid bruits.  The jugular venous pressure is normal.  The   thyroid is not enlarged.  The thyroidectomy scar is healing nicely There is no lymphadenopathy.  The chest is clear to percussion and auscultation.  There are no rales or rhonchi.  Expansion of the chest is symmetrical.  The precordium is quiet.  The first heart sound is normal.  The second heart sound is physiologically split.  There is no murmur gallop rub or click.  There is no abnormal lift or heave.  The abdomen is soft and nontender.  The bowel sounds are normal.  The liver and spleen are not enlarged.  There are no abdominal masses.  There are no abdominal bruits.  Extremities reveal good pedal pulses.  There is no phlebitis or edema.  There is no cyanosis or clubbing.  Strength is normal and symmetrical in all extremities.  There is no lateralizing weakness.  There are no sensory deficits.  The skin is warm and dry.  There is no rash.      Assessment / Plan: Continue on same medication except increase Crestor to 20 mg daily.  Recheck 4 months for followup office visit and fasting lab work.

## 2012-04-30 NOTE — Assessment & Plan Note (Signed)
The patient has had no TIA symptoms. 

## 2012-04-30 NOTE — Assessment & Plan Note (Signed)
We reviewed her recent fasting lab work.  Her LDL is still above goal for someone with vascular disease and we will increase her Crestor up to 20 mg daily and observe response.  She is not having any myalgias.  She has occasional nocturnal leg cramps.

## 2012-04-30 NOTE — Patient Instructions (Addendum)
Increase your Crestor to 20 mg daily  Your physician recommends that you schedule a follow-up appointment in: 4 months with fasting labs (lp/bmet/hfp)

## 2012-04-30 NOTE — Assessment & Plan Note (Signed)
The patient had recent surgery for removal of thyroid nodule.  The incision appears to be healing well.  She does complain of fatigue but is otherwise clinically euthyroid.

## 2012-05-05 ENCOUNTER — Encounter (INDEPENDENT_AMBULATORY_CARE_PROVIDER_SITE_OTHER): Payer: Self-pay | Admitting: Surgery

## 2012-05-05 ENCOUNTER — Ambulatory Visit (INDEPENDENT_AMBULATORY_CARE_PROVIDER_SITE_OTHER): Payer: Medicare Other | Admitting: Surgery

## 2012-05-05 VITALS — BP 118/58 | HR 72 | Temp 97.8°F | Ht 63.5 in | Wt 188.8 lb

## 2012-05-05 DIAGNOSIS — E890 Postprocedural hypothyroidism: Secondary | ICD-10-CM

## 2012-05-05 NOTE — Patient Instructions (Signed)
  COCOA BUTTER & VITAMIN E CREAM  ([employer]mer's or other brand)  Apply cocoa butter/vitamin E cream to your incision 2 - 3 times daily.  Massage cream into incision for one minute with each application.  Use sunscreen (50 SPF or higher) for first 6 months after surgery if area is exposed to sun.  You may substitute Mederma or other scar reducing creams as desired.   

## 2012-05-05 NOTE — Progress Notes (Signed)
General Surgery [facility]  Visit Diagnoses: 1. Hypothyroidism, postsurgical     HISTORY: Patient returns for her second postoperative visit. She is currently taking Synthroid 75 mcg daily. Postoperative TSH level is elevated at 9.526. She does note that she is having mild fatigue.  EXAM: Surgical incision is well healed with a good cosmetic result. No palpable abnormality in the thyroid bed. No sign of seroma. No sign of infection. Voice quality is normal.  IMPRESSION: Status post thyroid lobectomy for Hurthle cell adenoma, post surgical hypothyroidism  PLAN: Plan going to increase her dose of Synthroid to 100 mcg daily. I have given her a prescription for 25 mcg tablets to use with her current supply of 75 mcg daily. Once those are exhausted I have given her a second prescription for 100 mcg tablets to use daily.  We will check a TSH level in 6 weeks and contact her with the results. My goal is to keep her TSH level between 1.0 and 2.0.  Patient will return to this office for followup as needed.  Velora Heckler, MD, FACS General & Endocrine Surgery [facility]

## 2012-06-05 DIAGNOSIS — S8992XA Unspecified injury of left lower leg, initial encounter: Secondary | ICD-10-CM

## 2012-06-05 HISTORY — DX: Unspecified injury of left lower leg, initial encounter: S89.92XA

## 2012-06-09 ENCOUNTER — Encounter: Payer: Self-pay | Admitting: Neurosurgery

## 2012-06-10 ENCOUNTER — Ambulatory Visit (INDEPENDENT_AMBULATORY_CARE_PROVIDER_SITE_OTHER): Payer: Medicare Other | Admitting: Neurosurgery

## 2012-06-10 ENCOUNTER — Other Ambulatory Visit (INDEPENDENT_AMBULATORY_CARE_PROVIDER_SITE_OTHER): Payer: Medicare Other | Admitting: *Deleted

## 2012-06-10 ENCOUNTER — Encounter: Payer: Self-pay | Admitting: Neurosurgery

## 2012-06-10 VITALS — BP 136/76 | HR 71 | Resp 16 | Ht 63.0 in | Wt 188.5 lb

## 2012-06-10 DIAGNOSIS — I6529 Occlusion and stenosis of unspecified carotid artery: Secondary | ICD-10-CM

## 2012-06-10 DIAGNOSIS — Z48812 Encounter for surgical aftercare following surgery on the circulatory system: Secondary | ICD-10-CM

## 2012-06-10 NOTE — Progress Notes (Signed)
[facility] [place] Carotid Office Note  CC: Six-month carotid surveillance Referring Physician: Edilia Bo  History of Present Illness: 71 year old female patient of Dr. Edilia Bo who is status post right CEA in 1999. The patient denies any signs or symptoms of CVA, TIA, amaurosis fugax or any neural deficit. The patient denies any new medical diagnoses recent surgery.  Past Medical History  Diagnosis Date  . Hyperlipidemia   . Hypertension   . Carotid bruit   . Diverticulosis   . Arthritis   . GERD (gastroesophageal reflux disease)   . Thyroid disease 02-27-12    lt. thyroid nodule, bx. done 5 yrs ago-now some enlargement is seen.    ROS: [x]  Positive   [ ]  Denies    General: [ ]  Weight loss, [ ]  Fever, [ ]  chills Neurologic: [ ]  Dizziness, [ ]  Blackouts, [ ]  Seizure [ ]  Stroke, [ ]  "Mini stroke", [ ]  Slurred speech, [ ]  Temporary blindness; [ ]  weakness in arms or legs, [ ]  Hoarseness Cardiac: [ ]  Chest pain/pressure, [ ]  Shortness of breath at rest [ ]  Shortness of breath with exertion, [ ]  Atrial fibrillation or irregular heartbeat Vascular: [ ]  Pain in legs with walking, [ ]  Pain in legs at rest, [ ]  Pain in legs at night,  [ ]  Non-healing ulcer, [ ]  Blood clot in vein/DVT,   Pulmonary: [ ]  Home oxygen, [ ]  Productive cough, [ ]  Coughing up blood, [ ]  Asthma,  [ ]  Wheezing Musculoskeletal:  [ ]  Arthritis, [ ]  Low back pain, [ ]  Joint pain, history of swelling in left leg from an injury in September of 2013 Hematologic: [ ]  Easy Bruising, [ ]  Anemia; [ ]  Hepatitis Gastrointestinal: [ ]  Blood in stool, [ ]  Gastroesophageal Reflux/heartburn, [ ]  Trouble swallowing Urinary: [ ]  chronic Kidney disease, [ ]  on HD - [ ]  MWF or [ ]  TTHS, [ ]  Burning with urination, [ ]  Difficulty urinating Skin: [ ]  Rashes, [ ]  Wounds Psychological: [ ]  Anxiety, [ ]  Depression   Social History History  Substance Use Topics  . Smoking status: Former Smoker    Quit date:  09/24/1975  . Smokeless tobacco: Not on file  . Alcohol Use: 0.0 oz/week    1-2 Glasses of wine per week     1-2 drinks a week    Family History Family History  Problem Relation Age of Onset  . Heart disease Mother   . Hypertension Mother   . Arthritis Mother     Allergies  Allergen Reactions  . Ace Inhibitors     Reaction=cough  . Zebeta     Reaction does not work per patient    Current Outpatient Prescriptions  Medication Sig Dispense Refill  . amLODipine (NORVASC) 2.5 MG tablet Take 2.5 mg by mouth daily with breakfast.      . aspirin 81 MG EC tablet Take 81 mg by mouth daily with breakfast. Taking 2 daily      . B Complex Vitamins (VITAMIN-B COMPLEX PO) Take by mouth daily with breakfast.       . CALCIUM PO Take by mouth daily with breakfast. 600mg       . estrogens, conjugated, (PREMARIN) 0.3 MG tablet Take 0.3 mg by mouth daily with breakfast. Take daily for 21 days then do not take for 7 days.      . furosemide (LASIX) 40 MG tablet Take 40 mg by mouth as needed. For edema      .  levothyroxine (SYNTHROID, LEVOTHROID) 75 MCG tablet Take 100 mcg by mouth daily with breakfast.       . metoprolol succinate (TOPROL-XL) 100 MG 24 hr tablet Take 1 tablet (100 mg total) by mouth daily with breakfast.  30 tablet  11  . Multiple Vitamin (MULTIVITAMIN) tablet Take 1 tablet by mouth daily.        Marland Kitchen OVER THE COUNTER MEDICATION Take 1 tablet by mouth daily with breakfast. Mega Red.      . rosuvastatin (CRESTOR) 20 MG tablet Take 1 tablet (20 mg total) by mouth at bedtime.  90 tablet  3  . telmisartan-hydrochlorothiazide (MICARDIS HCT) 80-25 MG per tablet Take 1 tablet by mouth daily with breakfast.      . verapamil (VERELAN PM) 240 MG 24 hr capsule Take 240 mg by mouth daily with breakfast.        Physical Examination  Filed Vitals:   06/10/12 1604  BP: 136/76  Pulse: 71  Resp:     Body mass index is 33.39 kg/(m^2).  General:  WDWN in NAD Gait: Normal HEENT: WNL Eyes:  Pupils equal Pulmonary: normal non-labored breathing , without Rales, rhonchi,  wheezing Cardiac: RRR, without  Murmurs, rubs or gallops; Abdomen: soft, NT, no masses Skin: no rashes, ulcers noted  Vascular Exam Pulses: 3+ radial pulses bilaterally Carotid bruits: Carotid pulses heard on the left with a mild bruit on the right Extremities without ischemic changes, no Gangrene , no cellulitis; no open wounds;  Musculoskeletal: no muscle wasting or atrophy   Neurologic: A&O X 3; Appropriate Affect ; SENSATION: normal; MOTOR FUNCTION:  moving all extremities equally. Speech is fluent/normal  Non-Invasive Vascular Imaging CAROTID DUPLEX 06/10/2012  Right ICA 20 - 39 % stenosis Left ICA 40 - 59 % stenosis Bilateral ICA velocities appear less than previously recorded in March 2013 therefore we will keep the patient on six-month surveillance  ASSESSMENT/PLAN: Asymptomatic patient that will followup in 6 months for repeat carotid duplex. The patient's in agreement with this plan, her questions were encouraged and answered.  Lauree Chandler ANP   Clinic MD: Hart [facility] on call

## 2012-06-11 NOTE — Addendum Note (Signed)
Addended by: Sharee Pimple on: 06/11/2012 11:02 AM   Modules accepted: Orders

## 2012-06-15 ENCOUNTER — Other Ambulatory Visit (INDEPENDENT_AMBULATORY_CARE_PROVIDER_SITE_OTHER): Payer: Self-pay | Admitting: Surgery

## 2012-06-15 LAB — TSH: TSH: 5.699 u[IU]/mL — ABNORMAL HIGH (ref 0.350–4.500)

## 2012-06-16 ENCOUNTER — Telehealth (INDEPENDENT_AMBULATORY_CARE_PROVIDER_SITE_OTHER): Payer: Self-pay

## 2012-06-16 NOTE — Telephone Encounter (Signed)
Per Dr Ardine Eng request pt advised of need to change synthroid dose to per day. Change of med called to [facility] [phone]. Pt advised she should have tsh repeated in 6 wks by Dr Patty Sermons or Dr Rondel Baton and f/u. Pt states she has appt in Nov with Dr Patty Sermons and will have tsh repeated at that time.

## 2012-07-07 ENCOUNTER — Other Ambulatory Visit: Payer: Self-pay | Admitting: *Deleted

## 2012-07-07 MED ORDER — AMLODIPINE BESYLATE 2.5 MG PO TABS: 2.5000 mg | ORAL_TABLET | Freq: Every day | ORAL | Status: DC

## 2012-07-31 ENCOUNTER — Telehealth (INDEPENDENT_AMBULATORY_CARE_PROVIDER_SITE_OTHER): Payer: Self-pay

## 2012-07-31 NOTE — Telephone Encounter (Signed)
Labs here from Dr Rondel Baton to [facility] for review.

## 2012-08-27 ENCOUNTER — Telehealth: Payer: Self-pay | Admitting: *Deleted

## 2012-08-27 ENCOUNTER — Ambulatory Visit (INDEPENDENT_AMBULATORY_CARE_PROVIDER_SITE_OTHER): Payer: Medicare Other | Admitting: *Deleted

## 2012-08-27 DIAGNOSIS — E785 Hyperlipidemia, unspecified: Secondary | ICD-10-CM

## 2012-08-27 DIAGNOSIS — I6529 Occlusion and stenosis of unspecified carotid artery: Secondary | ICD-10-CM

## 2012-08-27 DIAGNOSIS — I119 Hypertensive heart disease without heart failure: Secondary | ICD-10-CM

## 2012-08-27 LAB — BASIC METABOLIC PANEL
Chloride: 105 mEq/L (ref 96–112)
GFR: 62.77 mL/min (ref >60.00)
Glucose, Bld: 103 mg/dL — ABNORMAL HIGH (ref 70–99)
Potassium: 4.9 mEq/L (ref 3.5–5.1)
Sodium: 140 mEq/L (ref 135–145)

## 2012-08-27 LAB — HEPATIC FUNCTION PANEL
AST: 23 U/L (ref 0–37)
Albumin: 4.1 g/dL (ref 3.5–5.2)
Alkaline Phosphatase: 61 U/L (ref 39–117)
Total Bilirubin: 0.6 mg/dL (ref 0.3–1.2)

## 2012-08-27 LAB — LIPID PANEL
Cholesterol: 146 mg/dL (ref 0–200)
HDL: 40.7 mg/dL (ref >39.00)
Total CHOL/HDL Ratio: 4
VLDL: 41.4 mg/dL — ABNORMAL HIGH (ref 0.0–40.0)

## 2012-08-27 MED ORDER — VERAPAMIL HCL ER 240 MG PO CP24: 240.0000 mg | ORAL_CAPSULE | Freq: Every day | ORAL | Status: DC

## 2012-08-27 NOTE — Progress Notes (Signed)
Quick Note:  Please make copy of labs for patient visit. ______ 

## 2012-08-27 NOTE — Telephone Encounter (Signed)
Saw patient when she was here for labs. Patient was c/o increased joint pain in several places since increasing her statin.  Advised to hold statin and discuss with  Dr. Patty Sermons at [facility] next week

## 2012-08-27 NOTE — Telephone Encounter (Signed)
Agree with advice given

## 2012-08-28 ENCOUNTER — Other Ambulatory Visit: Payer: Medicare Other

## 2012-09-04 ENCOUNTER — Encounter: Payer: Self-pay | Admitting: Cardiology

## 2012-09-04 ENCOUNTER — Ambulatory Visit (INDEPENDENT_AMBULATORY_CARE_PROVIDER_SITE_OTHER): Payer: Medicare Other | Admitting: Cardiology

## 2012-09-04 VITALS — BP 136/68 | HR 68 | Ht 60.0 in | Wt 185.1 lb

## 2012-09-04 DIAGNOSIS — E785 Hyperlipidemia, unspecified: Secondary | ICD-10-CM

## 2012-09-04 DIAGNOSIS — I119 Hypertensive heart disease without heart failure: Secondary | ICD-10-CM

## 2012-09-04 DIAGNOSIS — I6529 Occlusion and stenosis of unspecified carotid artery: Secondary | ICD-10-CM

## 2012-09-04 NOTE — Assessment & Plan Note (Signed)
The patient has not been experiencing any chest pain or dizzy spells or shortness of breath.  No palpitations.

## 2012-09-04 NOTE — Progress Notes (Signed)
[name] Date of Birth:  1941-01-18 [facility] [address] [place], [place]  [address] [phone]  Fax   [phone]  HPI: This pleasant 71 year old woman is seen for a scheduled four-month followup office visit. She has a past history of dyslipidemia. She has a history of known residual right carotid bruit after remote right carotid endarterectomy. She's had a recent carotid Doppler which showed no recurrent stenosis.  Her most recent carotid Doppler actually showed an apparent improvement for the first time. She does not have any history of ischemic heart disease. She had a normal nuclear stress test in 2005. She has had essential hypertension and exogenous obesity. She is hypothyroid on levothyroxine. She had a recent partial thyroidectomy by Dr. Gerrit Friends. He will be following her TSH and her dose was recently increased to 113 mcg daily   Current Outpatient Prescriptions  Medication Sig Dispense Refill  . amLODipine (NORVASC) 2.5 MG tablet Take 1 tablet (2.5 mg total) by mouth daily with breakfast.  90 tablet  3  . aspirin 81 MG EC tablet Take 162 mg by mouth daily with breakfast. Taking 2 daily      . B Complex Vitamins (VITAMIN-B COMPLEX PO) Take by mouth daily with breakfast.       . CALCIUM PO Take by mouth daily with breakfast. 600mg       . estrogens, conjugated, (PREMARIN) 0.3 MG tablet Take 0.3 mg by mouth daily.       . furosemide (LASIX) 40 MG tablet Take 40 mg by mouth as needed. For edema      . levothyroxine (SYNTHROID, LEVOTHROID) 75 MCG tablet Take 113 mcg by mouth daily with breakfast.       . metoprolol succinate (TOPROL-XL) 100 MG 24 hr tablet Take 1 tablet (100 mg total) by mouth daily with breakfast.  30 tablet  11  . Multiple Vitamin (MULTIVITAMIN) tablet Take 1 tablet by mouth daily.        Marland Kitchen OVER THE COUNTER MEDICATION Take 1 tablet by mouth daily with breakfast. Mega Red.      . rosuvastatin (CRESTOR) 20 MG tablet Take 1 tablet (20 mg  total) by mouth at bedtime.  90 tablet  3  . telmisartan-hydrochlorothiazide (MICARDIS HCT) 80-25 MG per tablet Take 1 tablet by mouth daily with breakfast.      . verapamil (VERELAN PM) 240 MG 24 hr capsule Take 1 capsule (240 mg total) by mouth daily with breakfast.  30 capsule  6    Allergies  Allergen Reactions  . Ace Inhibitors     Reaction=cough  . Zebeta     Reaction does not work per patient    Patient Active Problem List  Diagnosis  . Dyslipidemia  . Benign hypertensive heart disease without heart failure  . Personal history of carotid stenosis  . Hypothyroidism, postsurgical  . Occlusion and stenosis of carotid artery without mention of cerebral infarction    History  Smoking status  . Former Smoker  . Quit date: 09/24/1975  Smokeless tobacco  . Not on file    History  Alcohol Use  . 0.0 oz/week  . 1-2 Glasses of wine per week    Comment: 1-2 drinks a week    Family History  Problem Relation Age of Onset  . Heart disease Mother   . Hypertension Mother   . Arthritis Mother     Review of Systems: The patient denies any heat or cold intolerance.  No weight gain or  weight loss.  The patient denies headaches or blurry vision.  There is no cough or sputum production.  The patient denies dizziness.  There is no hematuria or hematochezia.  The patient denies any muscle aches or arthritis.  The patient denies any rash.  The patient denies frequent falling or instability.  There is no history of depression or anxiety.  All other systems were reviewed and are negative.   Physical Exam: Filed Vitals:   09/04/12 0940  BP: 136/68  Pulse: 68   the general appearance reveals a well-developed well-nourished woman in no distress.The head and neck exam reveals pupils equal and reactive.  Extraocular movements are full.  There is no scleral icterus.  The mouth and pharynx are normal.  The neck is supple.  The carotids reveal mild right carotid endarterectomy scar.  The  jugular venous pressure is normal.  The  thyroid is not enlarged.  There is no lymphadenopathy.  The chest is clear to percussion and auscultation.  There are no rales or rhonchi.  Expansion of the chest is symmetrical.  The precordium is quiet.  The first heart sound is normal.  The second heart sound is physiologically split.  There is no murmur gallop rub or click.  There is no abnormal lift or heave.  The abdomen is soft and nontender.  The bowel sounds are normal.  The liver and spleen are not enlarged.  There are no abdominal masses.  There are no abdominal bruits.  Extremities reveal good pedal pulses.  There is no phlebitis or edema.  There is no cyanosis or clubbing.  Strength is normal and symmetrical in all extremities.  There is no lateralizing weakness.  There are no sensory deficits.  The skin is warm and dry.  There is no rash.     Assessment / Plan: Continue on same medication.  Recheck in 4 months for office visit lipid panel hepatic function panel and basal metabolic panel

## 2012-09-04 NOTE — Patient Instructions (Addendum)
Your physician recommends that you continue on your current medications as directed. Please refer to the Current Medication list given to you today.  Your physician wants you to follow-up in: 4 months with fasting labs (lp/bmet/hfp) You will receive a reminder letter in the mail two months in advance. If you don't receive a letter, please call our office to schedule the follow-up appointment.  

## 2012-09-04 NOTE — Assessment & Plan Note (Signed)
The patient has been experiencing some discomfort in the base of her thumb and in her hip.  She has left off her Crestor for the past several days.  In retrospect she does not think her symptoms are from the Crestor.  She has been physically active on packing Christmas boxes.  She will leave off the Crestor for another week to be sure and then restart it in her same dosage.

## 2012-09-04 NOTE — Assessment & Plan Note (Signed)
She has not had any TIA symptoms.  Her most recent carotid duplex showed apparent lessening of her stenosis and this study will be rechecked again in another 6 months.

## 2012-09-10 ENCOUNTER — Other Ambulatory Visit: Payer: Self-pay | Admitting: Cardiology

## 2012-09-10 MED ORDER — TELMISARTAN-HCTZ 80-25 MG PO TABS: 1.0000 | ORAL_TABLET | Freq: Every day | ORAL | Status: DC

## 2012-09-14 ENCOUNTER — Encounter (INDEPENDENT_AMBULATORY_CARE_PROVIDER_SITE_OTHER): Payer: Self-pay

## 2012-12-09 ENCOUNTER — Ambulatory Visit: Payer: Medicare Other | Admitting: Neurosurgery

## 2012-12-09 ENCOUNTER — Other Ambulatory Visit: Payer: Medicare Other

## 2012-12-24 ENCOUNTER — Other Ambulatory Visit: Payer: Self-pay

## 2012-12-24 DIAGNOSIS — Z1231 Encounter for screening mammogram for malignant neoplasm of breast: Secondary | ICD-10-CM

## 2012-12-31 ENCOUNTER — Telehealth (INDEPENDENT_AMBULATORY_CARE_PROVIDER_SITE_OTHER): Payer: Self-pay

## 2012-12-31 NOTE — Telephone Encounter (Signed)
Received refill request for levothyoxine from [facility]. Request sent to Dr Gerrit Friends to review.

## 2013-01-04 ENCOUNTER — Telehealth: Payer: Self-pay | Admitting: Obstetrics & Gynecology

## 2013-01-04 MED ORDER — LEVOTHYROXINE SODIUM 75 MCG PO TABS: 113.0000 ug | ORAL_TABLET | Freq: Every day | ORAL | Status: DC

## 2013-01-04 NOTE — Telephone Encounter (Signed)
Refill sent to pharm and due call to pharm to confirm.

## 2013-01-04 NOTE — Telephone Encounter (Signed)
Patient requests Dr. Hyacinth Meeker write rx for Synthroid 112 mcgs daily/3 mth increments/[id number] [facility]/patient states she has been released from Dr. Chestine Spore Zatarain's office (FYI)/[place]

## 2013-01-04 NOTE — Telephone Encounter (Signed)
Patient scheduled for [facility]EX 01-21-13.  Last TSH 07-28-12 4.341.  She requests refill on Synthroid 112 mcg.  States Dr Gerrit Friends released her back to our care after left partial thyroidectomy.  Ok to refill one month month supply until can repeat TSH at [facility]?

## 2013-01-04 NOTE — Telephone Encounter (Signed)
Fine to RF until AEX.

## 2013-01-06 ENCOUNTER — Telehealth (INDEPENDENT_AMBULATORY_CARE_PROVIDER_SITE_OTHER): Payer: Self-pay

## 2013-01-06 NOTE — Telephone Encounter (Signed)
Per Dr Colette Ribas request faxed back to [facility] with instruction to contact Dr Patty Sermons for thyroid med refill. Dr Gerrit Friends no longer follows pt for thyroid labs or meds.

## 2013-01-07 ENCOUNTER — Other Ambulatory Visit: Payer: Self-pay | Admitting: *Deleted

## 2013-01-07 ENCOUNTER — Other Ambulatory Visit (INDEPENDENT_AMBULATORY_CARE_PROVIDER_SITE_OTHER): Payer: Medicare Other

## 2013-01-07 DIAGNOSIS — I119 Hypertensive heart disease without heart failure: Secondary | ICD-10-CM

## 2013-01-07 DIAGNOSIS — E039 Hypothyroidism, unspecified: Secondary | ICD-10-CM

## 2013-01-07 LAB — HEPATIC FUNCTION PANEL
ALT: 26 U/L (ref 0–35)
AST: 21 U/L (ref 0–37)
Alkaline Phosphatase: 60 U/L (ref 39–117)
Bilirubin, Direct: 0 mg/dL (ref 0.0–0.3)
Total Bilirubin: 0.6 mg/dL (ref 0.3–1.2)

## 2013-01-07 LAB — BASIC METABOLIC PANEL
BUN: 21 mg/dL (ref 6–23)
Calcium: 9.2 mg/dL (ref 8.4–10.5)
GFR: 61.18 mL/min (ref >60.00)
Glucose, Bld: 91 mg/dL (ref 70–99)

## 2013-01-07 LAB — LIPID PANEL: Triglycerides: 206 mg/dL — ABNORMAL HIGH (ref 0.0–149.0)

## 2013-01-07 MED ORDER — LEVOTHYROXINE SODIUM 112 MCG PO TABS: 112.0000 ug | ORAL_TABLET | Freq: Every day | ORAL | Status: DC

## 2013-01-07 NOTE — Progress Notes (Signed)
Quick Note:  Please make copy of labs for patient visit. ______ 

## 2013-01-11 ENCOUNTER — Ambulatory Visit (INDEPENDENT_AMBULATORY_CARE_PROVIDER_SITE_OTHER): Payer: Medicare Other | Admitting: Cardiology

## 2013-01-11 ENCOUNTER — Encounter: Payer: Self-pay | Admitting: Cardiology

## 2013-01-11 VITALS — HR 66 | Ht 63.0 in | Wt 185.8 lb

## 2013-01-11 DIAGNOSIS — I119 Hypertensive heart disease without heart failure: Secondary | ICD-10-CM

## 2013-01-11 DIAGNOSIS — I6529 Occlusion and stenosis of unspecified carotid artery: Secondary | ICD-10-CM

## 2013-01-11 DIAGNOSIS — E785 Hyperlipidemia, unspecified: Secondary | ICD-10-CM

## 2013-01-11 NOTE — Assessment & Plan Note (Signed)
No headaches or dizzy spells.  Blood pressure is remaining stable on current therapy.

## 2013-01-11 NOTE — Assessment & Plan Note (Signed)
Overall her lipids are better although her HDL has fallen somewhat.  Her weight is unchanged.  She is attempting to go over to the exercise room at her club and exercise regularly.

## 2013-01-11 NOTE — Assessment & Plan Note (Signed)
The patient has not been having any TIA symptoms. 

## 2013-01-11 NOTE — Progress Notes (Signed)
[name] Date of Birth:  26-Apr-1941 [facility] [address] [place], [place]  [address] [phone]  Fax   [phone]  HPI: This pleasant 72 year old woman is seen for a scheduled four-month followup office visit. She has a past history of dyslipidemia. She has a history of known residual right carotid bruit after remote right carotid endarterectomy. She's had a recent carotid Doppler which showed no recurrent stenosis. Her most recent carotid Doppler actually showed an apparent improvement for the first time. She does not have any history of ischemic heart disease. She had a normal nuclear stress test in 2005. She has had essential hypertension and exogenous obesity. She is hypothyroid on levothyroxine. She had a recent partial thyroidectomy by Dr. Gerrit Friends. He will be following her TSH and her dose was recently increased to 113 mcg daily.  Visit she has noted some aching in her hips consistent with arthritis.  The patient has also been experiencing some hoarseness in the evening consistent with seasonal allergies she will try some over-the-counter plain Claritin.   Current Outpatient Prescriptions  Medication Sig Dispense Refill  . amLODipine (NORVASC) 2.5 MG tablet Take 1 tablet (2.5 mg total) by mouth daily with breakfast.  90 tablet  3  . aspirin 81 MG EC tablet Take 162 mg by mouth daily with breakfast. Taking 2 daily      . B Complex Vitamins (VITAMIN-B COMPLEX PO) Take by mouth daily with breakfast.       . CALCIUM PO Take by mouth daily with breakfast. 600mg       . estrogens, conjugated, (PREMARIN) 0.3 MG tablet Take 0.3 mg by mouth daily.       . furosemide (LASIX) 40 MG tablet Take 40 mg by mouth as needed. For edema      . levothyroxine (SYNTHROID, LEVOTHROID) 112 MCG tablet Take 1 tablet (112 mcg total) by mouth daily.  30 tablet  11  . metoprolol succinate (TOPROL-XL) 100 MG 24 hr tablet Take 1 tablet (100 mg total) by mouth daily with breakfast.  30  tablet  11  . Multiple Vitamin (MULTIVITAMIN) tablet Take 1 tablet by mouth daily.        Marland Kitchen OVER THE COUNTER MEDICATION Take 1 tablet by mouth daily with breakfast. Mega Red.      . rosuvastatin (CRESTOR) 20 MG tablet Take 1 tablet (20 mg total) by mouth at bedtime.  90 tablet  3  . telmisartan-hydrochlorothiazide (MICARDIS HCT) 80-25 MG per tablet Take 1 tablet by mouth daily with breakfast.  30 tablet  11  . verapamil (VERELAN PM) 240 MG 24 hr capsule Take 1 capsule (240 mg total) by mouth daily with breakfast.  30 capsule  6   No current facility-administered medications for this visit.    Allergies  Allergen Reactions  . Ace Inhibitors     Reaction=cough  . Zebeta     Reaction does not work per patient    Patient Active Problem List  Diagnosis  . Dyslipidemia  . Benign hypertensive heart disease without heart failure  . Personal history of carotid stenosis  . Hypothyroidism, postsurgical  . Occlusion and stenosis of carotid artery without mention of cerebral infarction    History  Smoking status  . Former Smoker  . Quit date: 09/24/1975  Smokeless tobacco  . Not on file    History  Alcohol Use  . 0.0 oz/week  . 1-2 Glasses of wine per week    Comment: 1-2 drinks a week  Family History  Problem Relation Age of Onset  . Heart disease Mother   . Hypertension Mother   . Arthritis Mother     Review of Systems: The patient denies any heat or cold intolerance.  No weight gain or weight loss.  The patient denies headaches or blurry vision.  There is no cough or sputum production.  The patient denies dizziness.  There is no hematuria or hematochezia.  The patient denies any muscle aches or arthritis.  The patient denies any rash.  The patient denies frequent falling or instability.  There is no history of depression or anxiety.  All other systems were reviewed and are negative.   Physical Exam: Filed Vitals:   01/11/13 0947  Pulse: 66   the general appearance  reveals a well-developed well-nourished woman in no distress.The head and neck exam reveals pupils equal and reactive.  Extraocular movements are full.  There is no scleral icterus.  The mouth and pharynx are normal.  The neck is supple.  The carotids reveals old right carotid endarterectomy scar.  The jugular venous pressure is normal.  The  thyroid is not enlarged.  There is no lymphadenopathy.  The chest is clear to percussion and auscultation.  There are no rales or rhonchi.  Expansion of the chest is symmetrical.  The precordium is quiet.  The first heart sound is normal.  The second heart sound is physiologically split.  There is no murmur gallop rub or click.  There is no abnormal lift or heave.  The abdomen is soft and nontender.  The bowel sounds are normal.  The liver and spleen are not enlarged.  There are no abdominal masses.  There are no abdominal bruits.  Extremities reveal good pedal pulses.  There is no phlebitis or edema.  There is no cyanosis or clubbing.  Strength is normal and symmetrical in all extremities.  There is no lateralizing weakness.  There are no sensory deficits.  The skin is warm and dry.  There is no rash.     Assessment / Plan: Continue same medication.  Trial of Claritin for allergies. Recheck in 4 months for followup office visit lipid panel hepatic function panel and basal metabolic panel

## 2013-01-11 NOTE — Patient Instructions (Addendum)
Your physician recommends that you continue on your current medications as directed. Please refer to the Current Medication list given to you today.  Your physician recommends that you schedule a follow-up appointment in: 4 months with fasting labs (lp/bmet/hfp)  

## 2013-01-19 ENCOUNTER — Encounter: Payer: Self-pay | Admitting: Vascular Surgery

## 2013-01-20 ENCOUNTER — Other Ambulatory Visit (INDEPENDENT_AMBULATORY_CARE_PROVIDER_SITE_OTHER): Payer: Medicare Other | Admitting: *Deleted

## 2013-01-20 ENCOUNTER — Encounter: Payer: Self-pay | Admitting: Vascular Surgery

## 2013-01-20 ENCOUNTER — Ambulatory Visit (INDEPENDENT_AMBULATORY_CARE_PROVIDER_SITE_OTHER): Payer: Medicare Other | Admitting: Vascular Surgery

## 2013-01-20 DIAGNOSIS — I6529 Occlusion and stenosis of unspecified carotid artery: Secondary | ICD-10-CM

## 2013-01-20 DIAGNOSIS — Z48812 Encounter for surgical aftercare following surgery on the circulatory system: Secondary | ICD-10-CM

## 2013-01-20 NOTE — Assessment & Plan Note (Signed)
The patient is status post previous right carotid endarterectomy in 1999. She has no evidence of recurrent stenosis on the right. She has a 40-59% left carotid stenosis. He understands we would not consider left carotid endarterectomy unless the stenosis progressed to greater than 80% or he developed left hemispheric symptoms. I've ordered a follow carotid duplex scan in 1 year and I'll see her back at that time. In the meantime she knows to continue taking her aspirin.

## 2013-01-20 NOTE — Progress Notes (Signed)
[facility]  Patient name: [name] MRN: [id number] DOB: 1941-09-06 Sex: female  REASON FOR VISIT: follow up of carotid disease.  HPI: [name] is a 72 y.o. female who underwent a right carotid endarterectomy in 1999. She comes in for a routine follow up visit. Since I saw her last, she denies any history of stroke, TIAs, expressive or receptive aphasia, or amaurosis fugax. She is on Crestor for cholesterol and also takes aspirin daily. Blood pressure and cholesterol have been well controlled and her followed closely by Dr. Ronny Flurry.  Past Medical History  Diagnosis Date  . Hyperlipidemia   . Hypertension   . Carotid bruit   . Diverticulosis   . Arthritis   . GERD (gastroesophageal reflux disease)   . Thyroid disease 02-27-12    lt. thyroid nodule, bx. done 5 yrs ago-now some enlargement is seen.  . Injury of left leg 06/05/12    Pt. fell  . Peripheral vascular disease    Family History  Problem Relation Age of Onset  . Heart disease Mother   . Hypertension Mother   . Arthritis Mother    SOCIAL HISTORY: History  Substance Use Topics  . Smoking status: Former Smoker    Quit date: 09/24/1975  . Smokeless tobacco: Not on file  . Alcohol Use: 0.0 oz/week    1-2 Glasses of wine per week     Comment: 1-2 drinks a week   Allergies  Allergen Reactions  . Ace Inhibitors     Reaction=cough  . Zebeta     Reaction does not work per patient   REVIEW OF SYSTEMS: Arly.Keller ] denotes positive finding; [  ] denotes negative finding  CARDIOVASCULAR:  [ ]  chest pain   [ ]  chest pressure   [ ]  palpitations   [ ]  orthopnea   [ ]  dyspnea on exertion   [ ]  claudication   [ ]  rest pain   [ ]  DVT   [ ]  phlebitis PULMONARY:   [ ]  productive cough   [ ]  asthma   [ ]  wheezing NEUROLOGIC:   [ ]  weakness  [ ]  paresthesias  [ ]  aphasia  [ ]  amaurosis  [ ]  dizziness HEMATOLOGIC:   [ ]  bleeding problems   [ ]  clotting disorders MUSCULOSKELETAL:  [ ]  joint pain    [ ]  joint swelling Arly.Keller ] leg swelling intermittently GASTROINTESTINAL: [ ]   blood in stool  [ ]   hematemesis GENITOURINARY:  [ ]   dysuria  [ ]   hematuria PSYCHIATRIC:  [ ]  history of major depression INTEGUMENTARY:  [ ]  rashes  [ ]  ulcers CONSTITUTIONAL:  [ ]  fever   [ ]  chills  PHYSICAL EXAM: Filed Vitals:   01/20/13 1510 01/20/13 1516  BP: 113/4 107/51  Pulse: 65   Height: 5\' 3"  (1.6 m)   Weight: 189 lb (85.73 kg)   SpO2: 96%    Body mass index is 33.49 kg/(m^2). GENERAL: The patient is a well-nourished female, in no acute distress. The vital signs are documented above. CARDIOVASCULAR: There is a regular rate and rhythm. She has a right carotid bruit. PULMONARY: There is good air exchange bilaterally without wheezing or rales. ABDOMEN: Soft and non-tender with normal pitched bowel sounds.  MUSCULOSKELETAL: There are no major deformities or cyanosis. NEUROLOGIC: No focal weakness or paresthesias are detected. SKIN: There are no ulcers or rashes noted. PSYCHIATRIC: The patient has a normal affect.  DATA:  I have independently  interpreted her carotid duplex scan which shows a less than 40% right carotid stenosis and a 40-59% left carotid stenosis. I compared this to her study in September of 2013 in there been no significant changes.  MEDICAL ISSUES:  Occlusion and stenosis of carotid artery without mention of cerebral infarction The patient is status post previous right carotid endarterectomy in 1999. She has no evidence of recurrent stenosis on the right. She has a 40-59% left carotid stenosis. He understands we would not consider left carotid endarterectomy unless the stenosis progressed to greater than 80% or he developed left hemispheric symptoms. I've ordered a follow carotid duplex scan in 1 year and I'll see her back at that time. In the meantime she knows to continue taking her aspirin.   Monette Omara S [facility] [place] Beeper:  [phone]

## 2013-01-21 ENCOUNTER — Ambulatory Visit (INDEPENDENT_AMBULATORY_CARE_PROVIDER_SITE_OTHER): Payer: Medicare Other | Admitting: Obstetrics & Gynecology

## 2013-01-21 ENCOUNTER — Encounter: Payer: Self-pay | Admitting: Obstetrics & Gynecology

## 2013-01-21 VITALS — BP 126/62 | Ht 63.0 in | Wt 188.0 lb

## 2013-01-21 DIAGNOSIS — Z01419 Encounter for gynecological examination (general) (routine) without abnormal findings: Secondary | ICD-10-CM

## 2013-01-21 DIAGNOSIS — Z0000 Encounter for general adult medical examination without abnormal findings: Secondary | ICD-10-CM

## 2013-01-21 MED ORDER — ESTROGENS CONJUGATED 0.3 MG PO TABS: 0.3000 mg | ORAL_TABLET | Freq: Every day | ORAL | Status: DC

## 2013-01-21 NOTE — Progress Notes (Signed)
72 y.o. G2P2 MarriedCaucasianF here for annual exam.  No vaginal bleeding.  Doing well.    No LMP recorded. Patient has had a hysterectomy.          Sexually active: no  The current method of family planning is status post hysterectomy.    Exercising: yes  weights and treadmill Smoker:  no  Health Maintenance: Pap:  12/13/10 WNL MMG:  01/14/12 normal, pt is scheduled Colonoscopy:  09/29/07 repeat 5 years with Dr. Matthias Hughs BMD:   1/09   TDaP:  10/12 Labs: Dr Patty Sermons   reports that she quit smoking about 37 years ago. She has never used smokeless tobacco. She reports that  drinks alcohol. She reports that she does not use illicit drugs.  Past Medical History  Diagnosis Date  . Hyperlipidemia   . Hypertension   . Carotid bruit   . Diverticulosis   . Arthritis   . GERD (gastroesophageal reflux disease)   . Thyroid disease 02-27-12    lt. thyroid nodule, bx. done 5 yrs ago-now some enlargement is seen.  . Injury of left leg 06/05/12    Pt. fell  . Peripheral vascular disease     Past Surgical History  Procedure Laterality Date  . Colonoscopy    . Carotid endarterectomy  1998    Stable since surgery -slight build up returning-follows with yrly Dopplers  . Abdominal hysterectomy  01/1980  . Eye surgery  02-27-12    Bil. Cataract surgery  . Thyroid lobectomy  03/02/2012    Procedure: THYROID LOBECTOMY;  Surgeon: Velora Heckler, MD;  Location: [facility] ORS;  Service: General;  Laterality: Left;  . Cataract extraction, bilateral  2004 or 2005 per pt    Current Outpatient Prescriptions  Medication Sig Dispense Refill  . acetaminophen (TYLENOL) 500 MG tablet Take 500 mg by mouth 2 (two) times daily as needed for pain.      Marland Kitchen amLODipine (NORVASC) 2.5 MG tablet Take 1 tablet (2.5 mg total) by mouth daily with breakfast.  90 tablet  3  . aspirin 81 MG EC tablet Take 162 mg by mouth daily with breakfast. Taking 2 daily      . B Complex Vitamins (VITAMIN-B COMPLEX PO) Take by mouth daily with  breakfast.       . CALCIUM PO Take by mouth daily with breakfast. 600mg       . ESTRADIOL PO Take 2.5 mg by mouth daily.      . furosemide (LASIX) 40 MG tablet Take 40 mg by mouth as needed. For edema      . levothyroxine (SYNTHROID, LEVOTHROID) 112 MCG tablet Take 1 tablet (112 mcg total) by mouth daily.  30 tablet  11  . Loratadine (CLARITIN) 10 MG CAPS Take by mouth. As needed      . metoprolol succinate (TOPROL-XL) 100 MG 24 hr tablet Take 1 tablet (100 mg total) by mouth daily with breakfast.  30 tablet  11  . Multiple Vitamin (MULTIVITAMIN) tablet Take 1 tablet by mouth daily.        . ranitidine (ZANTAC) 150 MG tablet Take 150 mg by mouth as needed for heartburn.      . rosuvastatin (CRESTOR) 20 MG tablet Take 1 tablet (20 mg total) by mouth at bedtime.  90 tablet  3  . telmisartan-hydrochlorothiazide (MICARDIS HCT) 80-25 MG per tablet Take 1 tablet by mouth daily with breakfast.  30 tablet  11  . verapamil (VERELAN PM) 240 MG 24 hr capsule Take 1 capsule (  240 mg total) by mouth daily with breakfast.  30 capsule  6   No current facility-administered medications for this visit.    Family History  Problem Relation Age of Onset  . Heart disease Mother   . Hypertension Mother   . Arthritis Mother     ROS:  Pertinent items are noted in HPI.  Otherwise, a comprehensive ROS was negative.  Exam:   BP 126/62  Ht 5\' 3"  (1.6 m)  Wt 188 lb (85.276 kg)  BMI 33.31 kg/m2 Height: 5\' 3"  (160 cm)  Ht Readings from Last 3 Encounters:  01/21/13 5\' 3"  (1.6 m)  01/20/13 5\' 3"  (1.6 m)  01/11/13 5\' 3"  (1.6 m)    General appearance: alert, cooperative and appears stated age Head: Normocephalic, without obvious abnormality, atraumatic Neck: no adenopathy, supple, symmetrical, trachea midline and thyroid normal to inspection and palpation Lungs: clear to auscultation bilaterally Breasts: normal appearance, no masses or tenderness Heart: regular rate and rhythm Abdomen: soft, non-tender; bowel  sounds normal; no masses,  no organomegaly Extremities: extremities normal, atraumatic, no cyanosis or edema Skin: Skin color, texture, turgor normal. No rashes or lesions Lymph nodes: Cervical, supraclavicular, and axillary nodes normal. No abnormal inguinal nodes palpated Neurologic: Grossly normal   Pelvic: External genitalia:  no lesions              Urethra:  normal appearing urethra with no masses, tenderness or lesions              Bartholins and Skenes: normal                 Vagina: normal appearing vagina with normal color and discharge, no lesions              Cervix: no lesions              Pap taken: no Bimanual Exam:  Uterus:  uterus absent              Adnexa: no mass, fullness, tenderness               Rectovaginal: Confirms               Anus:  normal sphincter tone, no lesions  A:  Well Woman with normal exam PMP, on low dose HRT H/O TVH, ovaries remain Diverticulosis, chronic LLQ pain.  (H/O soft tissue mass noted on U/S 2008.  Dr. Matthias Hughs feels this is diverticulosis related.  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  Last pap smear 3/12.  No Pap today. TSH today.  If normal, will need to continue qday Rx for Premarin 0.3mg  qd given for year. BMD this year. return annually or prn  An After Visit Summary was printed and given to the patient.

## 2013-01-22 ENCOUNTER — Telehealth: Payer: Self-pay | Admitting: *Deleted

## 2013-01-22 NOTE — Telephone Encounter (Signed)
Prior authorization started for medication Premarin tablet 0.3mg  pending approval. sue

## 2013-01-25 ENCOUNTER — Telehealth: Payer: Self-pay | Admitting: Obstetrics & Gynecology

## 2013-01-25 ENCOUNTER — Other Ambulatory Visit: Payer: Self-pay | Admitting: Obstetrics & Gynecology

## 2013-01-25 DIAGNOSIS — Z780 Asymptomatic menopausal state: Secondary | ICD-10-CM

## 2013-01-25 LAB — HEMOGLOBIN, FINGERSTICK: Hemoglobin, fingerstick: 12.9 g/dL (ref 12.0–16.0)

## 2013-01-25 MED ORDER — ESTRADIOL 0.5 MG PO TABS: 0.5000 mg | ORAL_TABLET | Freq: Every day | ORAL | Status: DC

## 2013-01-25 NOTE — Telephone Encounter (Signed)
Spoke with pt about restarting estradiol 0.5 mg tablets, take half a tab a day. Instructed we sent a refill to her pharmacy. Pt agreeable.   aa

## 2013-01-25 NOTE — Telephone Encounter (Signed)
[facility] needs 90 day rx for Premarin if approved. Patient calling re: Premarin denial from her insurance company. Patient needs to know what to do now. Should she stay on Estradial? Patient has rx for Estradial.

## 2013-01-25 NOTE — Telephone Encounter (Signed)
Prior authorization denied for Premarin tablets. Chart and form on your desk to appeal or resubmit. sue

## 2013-01-25 NOTE — Telephone Encounter (Signed)
We should just go back on the estradiol.  She is on 0.5mg  and taking 1/2 tab daily.  Ok to call in or do in EPIC.  0.5mg  Estradiol 1/2 tab q day.  #45/4 RF.

## 2013-01-26 ENCOUNTER — Ambulatory Visit
Admission: RE | Admit: 2013-01-26 | Discharge: 2013-01-26 | Disposition: A | Discharge status: Home / Self Care | Payer: Medicare Other | Source: Ambulatory Visit

## 2013-01-26 ENCOUNTER — Other Ambulatory Visit: Payer: Medicare Other

## 2013-01-26 ENCOUNTER — Other Ambulatory Visit: Payer: Self-pay | Admitting: Obstetrics & Gynecology

## 2013-01-26 ENCOUNTER — Telehealth: Payer: Self-pay | Admitting: Obstetrics & Gynecology

## 2013-01-26 DIAGNOSIS — M8580 Other specified disorders of bone density and structure, unspecified site: Secondary | ICD-10-CM

## 2013-01-26 DIAGNOSIS — E039 Hypothyroidism, unspecified: Secondary | ICD-10-CM

## 2013-01-26 DIAGNOSIS — Z1231 Encounter for screening mammogram for malignant neoplasm of breast: Secondary | ICD-10-CM

## 2013-01-26 NOTE — Telephone Encounter (Signed)
Patient returning Kelly's call. °

## 2013-01-26 NOTE — Telephone Encounter (Signed)
Please apologize to pt.  Order present.  She can call and schedule at her convenience.

## 2013-01-26 NOTE — Telephone Encounter (Signed)
Pt is calling Dr. Randa Spike. She is currently at the breast center and they are saying she cannot have her bone density scan scheduled until they receive a faxed form from Dr. Hyacinth Meeker. The pt said she had a voicemail on her cell from [place] saying she had an appointment for her bone density scan at 9 am today, the pt did not see the message until after 11am. Please call the breast center to schedule new appt.

## 2013-01-26 NOTE — Telephone Encounter (Signed)
i have left patient a message re: her BMD. When she calls back I will let her know I spoke with Dr Buccini's office. Her colonoscopy was due in 1/14.

## 2013-01-27 ENCOUNTER — Other Ambulatory Visit: Payer: Self-pay | Admitting: *Deleted

## 2013-02-04 NOTE — Telephone Encounter (Signed)
Patient notified. Will call to schedule colonoscopy. Is having her BMD 02/05/13. Also is asking about her lab work she had-TSH, states is taking synthroid 112 mcg daily. Just making sure that is still correct.

## 2013-02-05 ENCOUNTER — Ambulatory Visit
Admission: RE | Admit: 2013-02-05 | Discharge: 2013-02-05 | Disposition: A | Discharge status: Home / Self Care | Payer: Medicare Other | Source: Ambulatory Visit | Attending: Obstetrics & Gynecology | Admitting: Obstetrics & Gynecology

## 2013-02-05 DIAGNOSIS — M8580 Other specified disorders of bone density and structure, unspecified site: Secondary | ICD-10-CM

## 2013-02-05 NOTE — Telephone Encounter (Signed)
I did not put in the order for this.  My fault.  Needs TSH.  Order now entered.  Make sure no charge for lab visit.

## 2013-02-08 ENCOUNTER — Telehealth: Payer: Self-pay

## 2013-02-08 NOTE — Telephone Encounter (Signed)
Message copied by Elisha Headland on Mon Feb 08, 2013  5:22 PM ------      Message from: Jerene Bears      Created: Fri Feb 05, 2013  3:34 PM       Please inform she has mild osteopenia in her hip.  Spine measurements good.  Looks good.  Repeat five years.  Try to get 1200mg  calcium in diet/supplement. ------

## 2013-02-08 NOTE — Telephone Encounter (Signed)
5/19 lmtcb//kn

## 2013-02-11 NOTE — Telephone Encounter (Signed)
5/22 lmtcb//kn 

## 2013-02-22 NOTE — Telephone Encounter (Signed)
Patient notified of all results. Also scheduled for lab work to check her TSH on 02/25/13.

## 2013-02-22 NOTE — Telephone Encounter (Signed)
Patient scheduled for TSH on 02/25/13.

## 2013-02-25 ENCOUNTER — Ambulatory Visit (INDEPENDENT_AMBULATORY_CARE_PROVIDER_SITE_OTHER): Payer: Medicare Other | Admitting: Obstetrics & Gynecology

## 2013-02-25 DIAGNOSIS — E039 Hypothyroidism, unspecified: Secondary | ICD-10-CM

## 2013-02-26 LAB — TSH: TSH: 3.525 u[IU]/mL (ref 0.350–4.500)

## 2013-03-01 ENCOUNTER — Encounter: Payer: Self-pay | Admitting: Obstetrics & Gynecology

## 2013-03-30 ENCOUNTER — Other Ambulatory Visit: Payer: Self-pay | Admitting: *Deleted

## 2013-03-30 MED ORDER — VERAPAMIL HCL ER 240 MG PO CP24: 240.0000 mg | ORAL_CAPSULE | Freq: Every day | ORAL | Status: DC

## 2013-04-06 ENCOUNTER — Other Ambulatory Visit: Payer: Self-pay | Admitting: *Deleted

## 2013-04-06 MED ORDER — METOPROLOL SUCCINATE ER 100 MG PO TB24: 100.0000 mg | ORAL_TABLET | Freq: Every day | ORAL | Status: DC

## 2013-05-13 ENCOUNTER — Other Ambulatory Visit: Payer: Self-pay

## 2013-05-13 DIAGNOSIS — E7800 Pure hypercholesterolemia, unspecified: Secondary | ICD-10-CM

## 2013-05-13 MED ORDER — ROSUVASTATIN CALCIUM 20 MG PO TABS: 20.0000 mg | ORAL_TABLET | Freq: Every day | ORAL | Status: DC

## 2013-05-21 ENCOUNTER — Other Ambulatory Visit (INDEPENDENT_AMBULATORY_CARE_PROVIDER_SITE_OTHER): Payer: Medicare Other

## 2013-05-21 DIAGNOSIS — I119 Hypertensive heart disease without heart failure: Secondary | ICD-10-CM

## 2013-05-21 LAB — BASIC METABOLIC PANEL
BUN: 17 mg/dL (ref 6–23)
Calcium: 9.4 mg/dL (ref 8.4–10.5)
GFR: 58.97 mL/min — ABNORMAL LOW (ref >60.00)
Glucose, Bld: 96 mg/dL (ref 70–99)
Potassium: 4.5 mEq/L (ref 3.5–5.1)
Sodium: 139 mEq/L (ref 135–145)

## 2013-05-21 LAB — HEPATIC FUNCTION PANEL
ALT: 24 U/L (ref 0–35)
AST: 24 U/L (ref 0–37)
Bilirubin, Direct: 0 mg/dL (ref 0.0–0.3)
Total Protein: 7.2 g/dL (ref 6.0–8.3)

## 2013-05-21 LAB — LIPID PANEL
Cholesterol: 132 mg/dL (ref 0–200)
HDL: 37.2 mg/dL — ABNORMAL LOW (ref >39.00)
VLDL: 31.8 mg/dL (ref 0.0–40.0)

## 2013-05-24 NOTE — Progress Notes (Signed)
Quick Note:  Please make copy of labs for patient visit. ______ 

## 2013-05-27 ENCOUNTER — Other Ambulatory Visit: Payer: Medicare Other

## 2013-05-28 ENCOUNTER — Ambulatory Visit (INDEPENDENT_AMBULATORY_CARE_PROVIDER_SITE_OTHER): Payer: Medicare Other | Admitting: Cardiology

## 2013-05-28 ENCOUNTER — Encounter: Payer: Self-pay | Admitting: Cardiology

## 2013-05-28 VITALS — BP 122/60 | HR 67 | Ht 63.0 in | Wt 187.0 lb

## 2013-05-28 DIAGNOSIS — E785 Hyperlipidemia, unspecified: Secondary | ICD-10-CM

## 2013-05-28 DIAGNOSIS — I6529 Occlusion and stenosis of unspecified carotid artery: Secondary | ICD-10-CM

## 2013-05-28 DIAGNOSIS — I119 Hypertensive heart disease without heart failure: Secondary | ICD-10-CM

## 2013-05-28 NOTE — Assessment & Plan Note (Signed)
She remains on low-dose Crestor.  Mild myalgias.  She is very pleased with the clinical improvement in her carotid plaque and we will not change the Crestor dose.  Her triglycerides are significantly improved this time.

## 2013-05-28 NOTE — Progress Notes (Signed)
[name] Date of Birth:  1940/10/24 [facility] [address] [place], [place]  [address] [phone]  Fax   [phone]  HPI: This pleasant 72 year old woman is seen for a scheduled four-month followup office visit. She has a past history of dyslipidemia. She has a history of known residual right carotid bruit after remote right carotid endarterectomy. She's had a recent carotid Doppler which showed evidence of plaque reduction and excellent flow. . She does not have any history of ischemic heart disease. She had a normal nuclear stress test in 2005. She has had essential hypertension and exogenous obesity. She is euthyroid on levothyroxine. She had a recent partial thyroidectomy by Dr. Gerrit Friends. He will be following her TSH and her dose was recently increased to 113 mcg daily.  She has some very mild myalgias but does not want to stop Crestor because of its beneficial effect on her carotid plaque.  Current Outpatient Prescriptions  Medication Sig Dispense Refill  . acetaminophen (TYLENOL) 500 MG tablet Take 500 mg by mouth 2 (two) times daily as needed for pain.      Marland Kitchen amLODipine (NORVASC) 2.5 MG tablet Take 1 tablet (2.5 mg total) by mouth daily with breakfast.  90 tablet  3  . aspirin 81 MG EC tablet Take 162 mg by mouth daily with breakfast. Taking 2 daily      . B Complex Vitamins (VITAMIN-B COMPLEX PO) Take by mouth daily with breakfast.       . CALCIUM PO Take by mouth daily with breakfast. 600mg       . estradiol (ESTRACE) 0.5 MG tablet Take 1 tablet (0.5 mg total) by mouth daily. Take half a tablet PO daily  45 tablet  4  . estrogens, conjugated, (PREMARIN) 0.3 MG tablet Take 1 tablet (0.3 mg total) by mouth daily. Take daily for 21 days then do not take for 7 days.  30 tablet  13  . furosemide (LASIX) 40 MG tablet Take 40 mg by mouth as needed. For edema      . levothyroxine (SYNTHROID, LEVOTHROID) 112 MCG tablet Take 1 tablet (112 mcg total) by mouth daily.   30 tablet  11  . Loratadine (CLARITIN) 10 MG CAPS Take by mouth. As needed      . metoprolol succinate (TOPROL-XL) 100 MG 24 hr tablet Take 1 tablet (100 mg total) by mouth daily with breakfast.  30 tablet  3  . Multiple Vitamin (MULTIVITAMIN) tablet Take 1 tablet by mouth daily.        . ranitidine (ZANTAC) 150 MG tablet Take 150 mg by mouth as needed for heartburn.      . rosuvastatin (CRESTOR) 20 MG tablet Take 1 tablet (20 mg total) by mouth at bedtime.  90 tablet  0  . telmisartan-hydrochlorothiazide (MICARDIS HCT) 80-25 MG per tablet Take 1 tablet by mouth daily with breakfast.  30 tablet  11  . verapamil (VERELAN PM) 240 MG 24 hr capsule Take 1 capsule (240 mg total) by mouth daily with breakfast.  30 capsule  2   No current facility-administered medications for this visit.    Allergies  Allergen Reactions  . Ace Inhibitors     Reaction=cough  . Zebeta     Reaction does not work per patient    Patient Active Problem List   Diagnosis Date Noted  . Occlusion and stenosis of carotid artery without mention of cerebral infarction 06/10/2012  . 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 Dyslipidemia 04/04/2011  .  Benign hypertensive heart disease without heart failure 04/04/2011  . Personal history of carotid stenosis 04/04/2011    History  Smoking status  . Former Smoker  . Quit date: 09/24/1975  Smokeless tobacco  . Never Used    History  Alcohol Use  . 0.0 oz/week  . 1-2 Glasses of wine per week    Comment: 1-2 drinks a week    Family History  Problem Relation Age of Onset  . Heart disease Mother   . Hypertension Mother   . Arthritis Mother     Review of Systems: The patient denies any heat or cold intolerance.  No weight gain or weight loss.  The patient denies headaches or blurry vision.  There is no cough or sputum production.  The patient denies dizziness.  There is no hematuria or hematochezia.  The patient denies any muscle aches or arthritis.  The  patient denies any rash.  The patient denies frequent falling or instability.  There is no history of depression or anxiety.  All other systems were reviewed and are negative.   Physical Exam: Filed Vitals:   05/28/13 0906  BP: 122/60  Pulse: 67   the general appearance reveals a well-developed well-nourished woman in no distress.  Weight is up only 2 pounds since last visit.The head and neck exam reveals pupils equal and reactive.  Extraocular movements are full.  There is no scleral icterus.  The mouth and pharynx are normal.  The neck is supple.  The carotids reveal no bruits.  The jugular venous pressure is normal.  The  thyroid is not enlarged.  There is no lymphadenopathy.  The chest is clear to percussion and auscultation.  There are no rales or rhonchi.  Expansion of the chest is symmetrical.  The precordium is quiet.  The first heart sound is normal.  The second heart sound is physiologically split.  There is no murmur gallop rub or click.  There is no abnormal lift or heave.  The abdomen is soft and nontender.  The bowel sounds are normal.  The liver and spleen are not enlarged.  There are no abdominal masses.  There are no abdominal bruits.  Extremities reveal good pedal pulses.  There is no phlebitis or edema.  There is no cyanosis or clubbing.  Strength is normal and symmetrical in all extremities.  There is no lateralizing weakness.  There are no sensory deficits.  The skin is warm and dry.  There is no rash.     Assessment / Plan: Continue on same medication.  Recheck in 4 months for office visit and fasting lab work

## 2013-05-28 NOTE — Assessment & Plan Note (Signed)
No TIA symptoms.  Recent carotid Dopplers show continued improvement in blood flow

## 2013-05-28 NOTE — Assessment & Plan Note (Signed)
Blood pressure is excellent on current therapy.  No chest pain or shortness of breath.

## 2013-05-28 NOTE — Patient Instructions (Addendum)
Your physician recommends that you continue on your current medications as directed. Please refer to the Current Medication list given to you today.  Your physician wants you to follow-up in: 4 months with fasting labs (lp/bmet/hfp) You will receive a reminder letter in the mail two months in advance. If you don't receive a letter, please call our office to schedule the follow-up appointment.  

## 2013-05-31 ENCOUNTER — Ambulatory Visit: Payer: Medicare Other | Admitting: Cardiology

## 2013-06-29 ENCOUNTER — Other Ambulatory Visit: Payer: Self-pay | Admitting: *Deleted

## 2013-06-29 MED ORDER — AMLODIPINE BESYLATE 2.5 MG PO TABS: 2.5000 mg | ORAL_TABLET | Freq: Every day | ORAL | Status: DC

## 2013-06-29 MED ORDER — VERAPAMIL HCL ER 240 MG PO CP24: 240.0000 mg | ORAL_CAPSULE | Freq: Every day | ORAL | Status: DC

## 2013-08-10 ENCOUNTER — Other Ambulatory Visit: Payer: Self-pay

## 2013-08-10 DIAGNOSIS — E7800 Pure hypercholesterolemia, unspecified: Secondary | ICD-10-CM

## 2013-08-10 MED ORDER — METOPROLOL SUCCINATE ER 100 MG PO TB24: 100.0000 mg | ORAL_TABLET | Freq: Every day | ORAL | Status: DC

## 2013-08-10 MED ORDER — ROSUVASTATIN CALCIUM 20 MG PO TABS: 20.0000 mg | ORAL_TABLET | Freq: Every day | ORAL | Status: DC

## 2013-09-06 ENCOUNTER — Other Ambulatory Visit: Payer: Self-pay

## 2013-09-06 ENCOUNTER — Other Ambulatory Visit: Payer: Self-pay | Admitting: Dermatology

## 2013-09-06 MED ORDER — TELMISARTAN-HCTZ 80-25 MG PO TABS: 1.0000 | ORAL_TABLET | Freq: Every day | ORAL | Status: DC

## 2013-10-22 ENCOUNTER — Other Ambulatory Visit (INDEPENDENT_AMBULATORY_CARE_PROVIDER_SITE_OTHER): Payer: Medicare Other

## 2013-10-22 DIAGNOSIS — E785 Hyperlipidemia, unspecified: Secondary | ICD-10-CM

## 2013-10-22 LAB — BASIC METABOLIC PANEL
BUN: 17 mg/dL (ref 6–23)
CHLORIDE: 105 meq/L (ref 96–112)
CO2: 24 mEq/L (ref 19–32)
Calcium: 9.1 mg/dL (ref 8.4–10.5)
Creatinine, Ser: 0.9 mg/dL (ref 0.4–1.2)
GFR: 66.69 mL/min (ref >60.00)
GLUCOSE: 91 mg/dL (ref 70–99)
POTASSIUM: 4 meq/L (ref 3.5–5.1)
SODIUM: 137 meq/L (ref 135–145)

## 2013-10-22 LAB — HEPATIC FUNCTION PANEL
ALT: 24 U/L (ref 0–35)
AST: 19 U/L (ref 0–37)
Albumin: 4 g/dL (ref 3.5–5.2)
Alkaline Phosphatase: 66 U/L (ref 39–117)
BILIRUBIN DIRECT: 0 mg/dL (ref 0.0–0.3)
BILIRUBIN TOTAL: 0.7 mg/dL (ref 0.3–1.2)
Total Protein: 7.3 g/dL (ref 6.0–8.3)

## 2013-10-22 LAB — LIPID PANEL
CHOL/HDL RATIO: 3
Cholesterol: 138 mg/dL (ref 0–200)
HDL: 40 mg/dL (ref >39.00)
LDL CALC: 60 mg/dL (ref 0–99)
TRIGLYCERIDES: 191 mg/dL — AB (ref 0.0–149.0)
VLDL: 38.2 mg/dL (ref 0.0–40.0)

## 2013-10-22 NOTE — Progress Notes (Signed)
Quick Note:  Please make copy of labs for patient visit. ______ 

## 2013-10-26 ENCOUNTER — Ambulatory Visit (INDEPENDENT_AMBULATORY_CARE_PROVIDER_SITE_OTHER): Payer: Medicare Other | Admitting: Cardiology

## 2013-10-26 ENCOUNTER — Encounter: Payer: Self-pay | Admitting: Cardiology

## 2013-10-26 VITALS — BP 114/64 | HR 70 | Ht 63.0 in | Wt 187.0 lb

## 2013-10-26 DIAGNOSIS — Z8679 Personal history of other diseases of the circulatory system: Secondary | ICD-10-CM

## 2013-10-26 DIAGNOSIS — I119 Hypertensive heart disease without heart failure: Secondary | ICD-10-CM

## 2013-10-26 DIAGNOSIS — E785 Hyperlipidemia, unspecified: Secondary | ICD-10-CM

## 2013-10-26 NOTE — Assessment & Plan Note (Signed)
She has been taking Crestor 20 mg each evening.  She has been experiencing some stiffness in her hips and thighs.  She thinks that if she switches from the evening to morning dosing that the myalgias may improve.  She will try that.  If not, we can consider reduction of Crestor dose.  Her cholesterol and LDL are satisfactory but her triglycerides remain high.  Her weight has remained elevated and unchanged since last visit at 187 pounds.

## 2013-10-26 NOTE — Assessment & Plan Note (Signed)
Blood pressure was remaining stable on current therapy.  No headaches dizziness or syncope. 

## 2013-10-26 NOTE — Assessment & Plan Note (Signed)
The patient has had no symptoms of recurrent TIA or stroke.

## 2013-10-26 NOTE — Patient Instructions (Signed)
Your physician recommends that you continue on your current medications as directed. Please refer to the Current Medication list given to you today.  Your physician wants you to follow-up in: 4 months with fasting labs (lp/bmet/hfp) You will receive a reminder letter in the mail two months in advance. If you don't receive a letter, please call our office to schedule the follow-up appointment.  

## 2013-10-26 NOTE — Progress Notes (Signed)
[name] Date of Birth:  [birth year] [address] [place], [place]  [address] [phone]  Fax   [phone]  HPI: This pleasant 73 year old woman is seen for a scheduled four-month followup office visit. She has a past history of dyslipidemia. She has a history of known residual right carotid bruit after remote right carotid endarterectomy. She's had a recent carotid Doppler which showed evidence of plaque reduction and excellent flow. . She does not have any history of ischemic heart disease. She had a normal nuclear stress test in 2005. She has had essential hypertension and exogenous obesity. She is euthyroid on levothyroxine. She had a recent partial thyroidectomy by Dr. Harlow Asa. He will be following her TSH and her dose was recently increased to 113 mcg daily.  She has some very mild myalgias but does not want to stop Crestor because of its beneficial effect on her carotid plaque.  Current Outpatient Prescriptions  Medication Sig Dispense Refill  . acetaminophen (TYLENOL) 500 MG tablet Take 500 mg by mouth 2 (two) times daily as needed for pain.      Marland Kitchen amLODipine (NORVASC) 2.5 MG tablet Take 1 tablet (2.5 mg total) by mouth daily with breakfast.  90 tablet  3  . aspirin 81 MG EC tablet Take 162 mg by mouth daily with breakfast. Taking 2 daily      . B Complex Vitamins (VITAMIN-B COMPLEX PO) Take by mouth daily with breakfast.       . CALCIUM PO Take by mouth daily with breakfast. 600mg       . estradiol (ESTRACE) 0.5 MG tablet Take 1 tablet (0.5 mg total) by mouth daily. Take half a tablet PO daily  45 tablet  4  . estrogens, conjugated, (PREMARIN) 0.3 MG tablet Take 1 tablet (0.3 mg total) by mouth daily. Take daily for 21 days then do not take for 7 days.  30 tablet  13  . furosemide (LASIX) 40 MG tablet Take 40 mg by mouth as needed. For edema      . levothyroxine (SYNTHROID, LEVOTHROID) 112 MCG tablet Take 1 tablet (112 mcg total) by mouth daily.  30 tablet  11  .  Loratadine (CLARITIN) 10 MG CAPS Take by mouth. As needed      . metoprolol succinate (TOPROL-XL) 100 MG 24 hr tablet Take 1 tablet (100 mg total) by mouth daily with breakfast.  30 tablet  6  . Multiple Vitamin (MULTIVITAMIN) tablet Take 1 tablet by mouth daily.        . ranitidine (ZANTAC) 150 MG tablet Take 150 mg by mouth as needed for heartburn.      . rosuvastatin (CRESTOR) 20 MG tablet Take 1 tablet (20 mg total) by mouth at bedtime.  30 tablet  6  . telmisartan-hydrochlorothiazide (MICARDIS HCT) 80-25 MG per tablet Take 1 tablet by mouth daily with breakfast.  30 tablet  6  . verapamil (VERELAN PM) 240 MG 24 hr capsule Take 1 capsule (240 mg total) by mouth daily with breakfast.  30 capsule  11   No current facility-administered medications for this visit.    Allergies  Allergen Reactions  . Ace Inhibitors     Reaction=cough  . Zebeta     Reaction does not work per patient    Patient Active Problem List   Diagnosis Date Noted  . Occlusion and stenosis of carotid artery without mention of cerebral infarction 06/10/2012  . 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 Dyslipidemia 04/04/2011  . Benign hypertensive  heart disease without heart failure 04/04/2011  . Personal history of carotid stenosis 04/04/2011    History  Smoking status  . Former Smoker  . Quit date: 09/24/1975  Smokeless tobacco  . Never Used    History  Alcohol Use  . 0.0 oz/week  . 1-2 Glasses of wine per week    Comment: 1-2 drinks a week    Family History  Problem Relation Age of Onset  . Heart disease Mother   . Hypertension Mother   . Arthritis Mother     Review of Systems: The patient denies any heat or cold intolerance.  No weight gain or weight loss.  The patient denies headaches or blurry vision.  There is no cough or sputum production.  The patient denies dizziness.  There is no hematuria or hematochezia.  The patient denies any muscle aches or arthritis.  The patient denies any rash.   The patient denies frequent falling or instability.  There is no history of depression or anxiety.  All other systems were reviewed and are negative.   Physical Exam: Filed Vitals:   10/26/13 0922  BP: 114/64  Pulse: 70   the general appearance reveals a well-developed well-nourished woman in no distress.  Weight is up only 2 pounds since last visit.The head and neck exam reveals pupils equal and reactive.  Extraocular movements are full.  There is no scleral icterus.  The mouth and pharynx are normal.  The neck is supple.  The carotids reveal no bruits.  The jugular venous pressure is normal.  The  thyroid is not enlarged.  There is no lymphadenopathy.  The chest is clear to percussion and auscultation.  There are no rales or rhonchi.  Expansion of the chest is symmetrical.  The precordium is quiet.  The first heart sound is normal.  The second heart sound is physiologically split.  There is no murmur gallop rub or click.  There is no abnormal lift or heave.  The abdomen is soft and nontender.  The bowel sounds are normal.  The liver and spleen are not enlarged.  There are no abdominal masses.  There are no abdominal bruits.  Extremities reveal good pedal pulses.  There is no phlebitis or edema.  There is no cyanosis or clubbing.  Strength is normal and symmetrical in all extremities.  There is no lateralizing weakness.  There are no sensory deficits.  The skin is warm and dry.  There is no rash.     Assessment / Plan: Continue on same medication.  Start taking Crestor in the morning instead of the evening and see if it makes any difference with her myalgias.  If not she will contact us and we can consider reduction of Crestor dose.  Recheck in 4 months for office visit and fasting lab work

## 2013-10-28 ENCOUNTER — Other Ambulatory Visit: Payer: Self-pay | Admitting: Vascular Surgery

## 2013-10-28 DIAGNOSIS — I6529 Occlusion and stenosis of unspecified carotid artery: Secondary | ICD-10-CM

## 2013-12-28 ENCOUNTER — Other Ambulatory Visit: Payer: Self-pay

## 2013-12-28 DIAGNOSIS — Z1231 Encounter for screening mammogram for malignant neoplasm of breast: Secondary | ICD-10-CM

## 2014-01-25 ENCOUNTER — Encounter: Payer: Self-pay | Admitting: Vascular Surgery

## 2014-01-26 ENCOUNTER — Encounter: Payer: Self-pay | Admitting: Vascular Surgery

## 2014-01-26 ENCOUNTER — Ambulatory Visit (INDEPENDENT_AMBULATORY_CARE_PROVIDER_SITE_OTHER): Payer: Medicare Other | Admitting: Vascular Surgery

## 2014-01-26 ENCOUNTER — Ambulatory Visit (HOSPITAL_COMMUNITY)
Admission: RE | Admit: 2014-01-26 | Discharge: 2014-01-26 | Disposition: A | Discharge status: Home / Self Care | Payer: Medicare Other | Source: Ambulatory Visit | Attending: Vascular Surgery | Admitting: Vascular Surgery

## 2014-01-26 ENCOUNTER — Other Ambulatory Visit: Payer: Self-pay | Admitting: Vascular Surgery

## 2014-01-26 VITALS — BP 126/47 | HR 63 | Ht 63.0 in | Wt 191.0 lb

## 2014-01-26 DIAGNOSIS — I6529 Occlusion and stenosis of unspecified carotid artery: Secondary | ICD-10-CM

## 2014-01-26 DIAGNOSIS — Z48812 Encounter for surgical aftercare following surgery on the circulatory system: Secondary | ICD-10-CM

## 2014-01-26 NOTE — Assessment & Plan Note (Signed)
The patient has a 40-59% recurrent right carotid stenosis and a 40-59% left carotid stenosis. He understands we would not consider carotid endarterectomy was the stenosis progressed to greater than 80% were she developed new neurologic symptoms. Currently she is asymptomatic. She is on aspirin. She is on Crestor. She is not a smoker. I were to follow carotid duplex scan in 1 year now see her back at that time. She knows to call sooner she has problems.

## 2014-01-26 NOTE — Progress Notes (Signed)
[facility]  Patient name: [name] MRN: [id number] DOB: 1941/07/15 Sex: female  REASON FOR VISIT: F/U of carotid disease   HPI: [name] is a 73 y.o. female who I last saw on 01/20/2013. She underwent a right carotid endarterectomy in 1999. Her carotid duplex scan at that time showed a less than 40% right carotid stenosis, with a 40-59% left carotid stenosis. She was set up for a one year follow up visit.  Since I saw her last, she denies any history of stroke, TIAs, expressive or receptive aphasia, or amaurosis fugax. She is on aspirin and Crestor. She is not a smoker. There have been no significant changes in her medical history since I saw her last period  Past Medical History  Diagnosis Date  . Hyperlipidemia   . Hypertension   . Carotid bruit   . Diverticulosis   . Arthritis   . GERD (gastroesophageal reflux disease)   . Thyroid disease 02-27-12    lt. thyroid nodule, bx. done 5 yrs ago-now some enlargement is seen.  . Injury of left leg 06/05/12    Pt. fell  . Peripheral vascular disease    Family History  Problem Relation Age of Onset  . Heart disease Mother   . Hypertension Mother   . Arthritis Mother    SOCIAL HISTORY: History  Substance Use Topics  . Smoking status: Former Smoker    Quit date: 09/24/1975  . Smokeless tobacco: Never Used  . Alcohol Use: 0.0 oz/week    1-2 Glasses of wine per week     Comment: 1-2 drinks a week   Allergies  Allergen Reactions  . Ace Inhibitors     Reaction=cough  . Zebeta     Reaction does not work per patient   REVIEW OF SYSTEMS: Valu.Nieves ] denotes positive finding; [  ] denotes negative finding  CARDIOVASCULAR:  [ ]  chest pain   [ ]  chest pressure   [ ]  palpitations   [ ]  orthopnea   [ ]  dyspnea on exertion   [ ]  claudication   [ ]  rest pain   [ ]  DVT   [ ]  phlebitis PULMONARY:   [ ]  productive cough   [ ]  asthma   [ ]  wheezing NEUROLOGIC:   [ ]  weakness  [ ]  paresthesias  [ ]  aphasia   [ ]  amaurosis  [ ]  dizziness HEMATOLOGIC:   [ ]  bleeding problems   [ ]  clotting disorders MUSCULOSKELETAL:  [ ]  joint pain   [ ]  joint swelling [ ]  leg swelling GASTROINTESTINAL: [ ]   blood in stool  [ ]   hematemesis GENITOURINARY:  [ ]   dysuria  [ ]   hematuria PSYCHIATRIC:  [ ]  history of major depression INTEGUMENTARY:  [ ]  rashes  [ ]  ulcers CONSTITUTIONAL:  [ ]  fever   [ ]  chills  PHYSICAL EXAM: Filed Vitals:   01/26/14 1554 01/26/14 1557  BP: 124/48 126/47  Pulse: 63   Height: 5\' 3"  (1.6 m)   Weight: 191 lb (86.637 kg)   SpO2: 96%    Body mass index is 33.84 kg/(m^2). GENERAL: The patient is a well-nourished female, in no acute distress. The vital signs are documented above. CARDIOVASCULAR: There is a regular rate and rhythm. She has bilateral carotid bruits. PULMONARY: There is good air exchange bilaterally without wheezing or rales. ABDOMEN: Soft and non-tender with normal pitched bowel sounds.  MUSCULOSKELETAL: There are no major deformities or cyanosis. NEUROLOGIC:  No focal weakness or paresthesias are detected. SKIN: There are no ulcers or rashes noted. PSYCHIATRIC: The patient has a normal affect.  DATA:  CAROTID DUPLEX: I have independently interpret her carotid duplex scan today which shows that she has a 40-59% recurrent right carotid stenosis and a 40-59% left internal carotid artery stenosis.  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 He understands we would not consider carotid endarterectomy was the stenosis progressed to greater than 80% were she developed new neurologic symptoms. Currently she is asymptomatic. She is on aspirin. She is on Crestor. She is not a smoker. I were to follow carotid duplex scan in 1 year now see her back at that time. She knows to call sooner she has problems.    Return in about 1 year  (around 01/27/2015).  Angelia Mould [facility]: [phone]

## 2014-01-27 ENCOUNTER — Ambulatory Visit
Admission: RE | Admit: 2014-01-27 | Discharge: 2014-01-27 | Disposition: A | Discharge status: Home / Self Care | Payer: Medicare Other | Source: Ambulatory Visit

## 2014-01-27 DIAGNOSIS — Z1231 Encounter for screening mammogram for malignant neoplasm of breast: Secondary | ICD-10-CM

## 2014-02-07 ENCOUNTER — Telehealth: Payer: Self-pay | Admitting: Obstetrics & Gynecology

## 2014-02-07 DIAGNOSIS — E039 Hypothyroidism, unspecified: Secondary | ICD-10-CM

## 2014-02-07 NOTE — Telephone Encounter (Signed)
Patient needs refill for levothyroxine patient only has two pills. She says that dr Sabra Heck gave he the rx in august of last year and she was sure she took it to pharmacy but they said they dont have that rx. Needs refills for a few months to get her until august of this year when she come back to see dr Sabra Heck.  [facility]

## 2014-02-08 MED ORDER — LEVOTHYROXINE SODIUM 112 MCG PO TABS: 112.0000 ug | ORAL_TABLET | Freq: Every day | ORAL | Status: DC

## 2014-02-08 NOTE — Telephone Encounter (Signed)
Patient calling to check on status of request for refill. She is taking her last dose today and needs a new RX called in today if possible. The patient is going out town Thursday and doesn't want to miss any doses.

## 2014-02-08 NOTE — Telephone Encounter (Signed)
Last AEX 01/21/13 Last refill 01/07/13 #30/ 11 refills Next appt 04/28/14  Rx sent to pharmacy until appt 04/2014 - LM for pt re: Rx sent to pharmacy.

## 2014-02-17 ENCOUNTER — Other Ambulatory Visit: Payer: Self-pay

## 2014-02-17 MED ORDER — ESTRADIOL 0.5 MG PO TABS: ORAL_TABLET | ORAL | Status: DC

## 2014-02-17 MED ORDER — ESTRADIOL 0.5 MG PO TABS: 0.5000 mg | ORAL_TABLET | Freq: Every day | ORAL | Status: DC

## 2014-02-17 NOTE — Telephone Encounter (Signed)
aex is 04-28-14. rx sent for #45 with 1 refill to pharmacy

## 2014-02-21 ENCOUNTER — Other Ambulatory Visit (INDEPENDENT_AMBULATORY_CARE_PROVIDER_SITE_OTHER): Payer: Medicare Other

## 2014-02-21 DIAGNOSIS — E785 Hyperlipidemia, unspecified: Secondary | ICD-10-CM

## 2014-02-21 DIAGNOSIS — I119 Hypertensive heart disease without heart failure: Secondary | ICD-10-CM

## 2014-02-21 LAB — BASIC METABOLIC PANEL
BUN: 19 mg/dL (ref 6–23)
CHLORIDE: 103 meq/L (ref 96–112)
CO2: 27 mEq/L (ref 19–32)
Calcium: 9.3 mg/dL (ref 8.4–10.5)
Creatinine, Ser: 1 mg/dL (ref 0.4–1.2)
GFR: 56.19 mL/min — ABNORMAL LOW (ref >60.00)
GLUCOSE: 91 mg/dL (ref 70–99)
Potassium: 4 mEq/L (ref 3.5–5.1)
SODIUM: 138 meq/L (ref 135–145)

## 2014-02-21 LAB — HEPATIC FUNCTION PANEL
ALT: 24 U/L (ref 0–35)
AST: 20 U/L (ref 0–37)
Albumin: 3.9 g/dL (ref 3.5–5.2)
Alkaline Phosphatase: 60 U/L (ref 39–117)
BILIRUBIN DIRECT: 0 mg/dL (ref 0.0–0.3)
Total Bilirubin: 0.6 mg/dL (ref 0.2–1.2)
Total Protein: 7 g/dL (ref 6.0–8.3)

## 2014-02-21 LAB — LIPID PANEL
Cholesterol: 215 mg/dL — ABNORMAL HIGH (ref 0–200)
HDL: 35.7 mg/dL — AB (ref >39.00)
LDL CALC: 120 mg/dL — AB (ref 0–99)
Total CHOL/HDL Ratio: 6
Triglycerides: 296 mg/dL — ABNORMAL HIGH (ref 0.0–149.0)
VLDL: 59.2 mg/dL — ABNORMAL HIGH (ref 0.0–40.0)

## 2014-02-21 NOTE — Progress Notes (Signed)
Quick Note:  Please make copy of labs for patient visit. ______ 

## 2014-02-28 ENCOUNTER — Ambulatory Visit (INDEPENDENT_AMBULATORY_CARE_PROVIDER_SITE_OTHER): Payer: Medicare Other | Admitting: Cardiology

## 2014-02-28 ENCOUNTER — Encounter: Payer: Self-pay | Admitting: Cardiology

## 2014-02-28 VITALS — BP 109/52 | HR 72 | Ht 63.0 in | Wt 188.0 lb

## 2014-02-28 DIAGNOSIS — Z8679 Personal history of other diseases of the circulatory system: Secondary | ICD-10-CM

## 2014-02-28 DIAGNOSIS — E785 Hyperlipidemia, unspecified: Secondary | ICD-10-CM

## 2014-02-28 DIAGNOSIS — I6529 Occlusion and stenosis of unspecified carotid artery: Secondary | ICD-10-CM

## 2014-02-28 DIAGNOSIS — I119 Hypertensive heart disease without heart failure: Secondary | ICD-10-CM

## 2014-02-28 DIAGNOSIS — E7800 Pure hypercholesterolemia, unspecified: Secondary | ICD-10-CM

## 2014-02-28 MED ORDER — ROSUVASTATIN CALCIUM 10 MG PO TABS: 10.0000 mg | ORAL_TABLET | Freq: Every day | ORAL | Status: DC

## 2014-02-28 NOTE — Assessment & Plan Note (Signed)
She has not been having any TIA symptoms.  She gets routine followup carotid Dopplers through her vascular surgeon Dr. Gae Gallop.

## 2014-02-28 NOTE — Assessment & Plan Note (Signed)
The patient has not been having any exertional chest pain or palpitations or increased shortness of breath.  Her weight is up 1 pound since last visit.  He has been exercising less because of her left leg discomfort.

## 2014-02-28 NOTE — Progress Notes (Signed)
[name] Date of Birth:  Aug 30, 1941 [facility] [address] [place], [place]  [address] [phone]  Fax   [phone]  HPI: This pleasant 73 year old woman is seen for a scheduled four-month followup office visit. She has a past history of dyslipidemia. She has a history of known residual right carotid bruit after remote right carotid endarterectomy. She's had a recent carotid Doppler which showed evidence of plaque reduction and excellent flow. . She does not have any history of ischemic heart disease. She had a normal nuclear stress test in 2005. She has had essential hypertension and exogenous obesity. She is euthyroid on levothyroxine.  Since last visit she had developed some pain in her legs particularly her left leg and she thought it might be a side effect of her statins so she stopped her statins for several weeks.  The blood drawn at the end of that period of time showed a significant doubling of her cholesterol levels.  Subsequently she has seen her orthopedist Dr. Durward Fortes who has scheduled her for an MRI of her back.  She may be having sciatica pain.    Current Outpatient Prescriptions  Medication Sig Dispense Refill  . acetaminophen (TYLENOL) 500 MG tablet Take 500 mg by mouth 2 (two) times daily as needed for pain.      Marland Kitchen amLODipine (NORVASC) 2.5 MG tablet Take 1 tablet (2.5 mg total) by mouth daily with breakfast.  90 tablet  3  . aspirin 81 MG EC tablet Take 162 mg by mouth daily with breakfast. Taking 2 daily      . B Complex Vitamins (VITAMIN-B COMPLEX PO) Take by mouth daily with breakfast.       . CALCIUM PO Take by mouth daily with breakfast. 600mg       . estradiol (ESTRACE) 0.5 MG tablet Take 1 tablet (0.5 mg total) by mouth daily. Take half a tablet PO daily  45 tablet  4  . estradiol (ESTRACE) 0.5 MG tablet Take 1/2 tab daily  45 tablet  1  . estrogens, conjugated, (PREMARIN) 0.3 MG tablet Take 1 tablet (0.3 mg total) by mouth daily.  Take daily for 21 days then do not take for 7 days.  30 tablet  13  . furosemide (LASIX) 40 MG tablet Take 40 mg by mouth as needed. For edema      . levothyroxine (SYNTHROID, LEVOTHROID) 112 MCG tablet Take 1 tablet (112 mcg total) by mouth daily.  30 tablet  2  . Loratadine (CLARITIN) 10 MG CAPS Take by mouth. As needed      . metoprolol succinate (TOPROL-XL) 100 MG 24 hr tablet Take 1 tablet (100 mg total) by mouth daily with breakfast.  30 tablet  6  . Multiple Vitamin (MULTIVITAMIN) tablet Take 1 tablet by mouth daily.        . ranitidine (ZANTAC) 150 MG tablet Take 150 mg by mouth as needed for heartburn.      . rosuvastatin (CRESTOR) 10 MG tablet Take 1 tablet (10 mg total) by mouth at bedtime.  90 tablet  3  . telmisartan-hydrochlorothiazide (MICARDIS HCT) 80-25 MG per tablet Take 1 tablet by mouth daily with breakfast.  30 tablet  6  . verapamil (VERELAN PM) 240 MG 24 hr capsule Take 1 capsule (240 mg total) by mouth daily with breakfast.  30 capsule  11   No current facility-administered medications for this visit.    Allergies  Allergen Reactions  . Ace Inhibitors  Reaction=cough  . Zebeta     Reaction does not work per patient    Patient Active Problem List   Diagnosis Date Noted  . Occlusion and stenosis of carotid artery without mention of cerebral infarction 06/10/2012  . 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 Dyslipidemia 04/04/2011  . Benign hypertensive heart disease without heart failure 04/04/2011    History  Smoking status  . Former Smoker  . Quit date: 09/24/1975  Smokeless tobacco  . Never Used    History  Alcohol Use  . 0.0 oz/week  . 1-2 Glasses of wine per week    Comment: 1-2 drinks a week    Family History  Problem Relation Age of Onset  . Heart disease Mother   . Hypertension Mother   . Arthritis Mother     Review of Systems: The patient denies any heat or cold intolerance.  No weight gain or weight loss.  The patient denies  headaches or blurry vision.  There is no cough or sputum production.  The patient denies dizziness.  There is no hematuria or hematochezia.  The patient denies any muscle aches or arthritis.  The patient denies any rash.  The patient denies frequent falling or instability.  There is no history of depression or anxiety.  All other systems were reviewed and are negative.   Physical Exam: Filed Vitals:   02/28/14 1027  BP: 109/52  Pulse: 72   the general appearance reveals a well-developed well-nourished mildly overweight woman in no acute distress.The head and neck exam reveals pupils equal and reactive.  Extraocular movements are full.  There is no scleral icterus.  The mouth and pharynx are normal.  The neck is supple.  The carotids reveal soft high-pitched right carotid bruits.  The jugular venous pressure is normal.  The  thyroid is not enlarged.  There is no lymphadenopathy.  The chest is clear to percussion and auscultation.  There are no rales or rhonchi.  Expansion of the chest is symmetrical.  The precordium is quiet.  The first heart sound is normal.  The second heart sound is physiologically split.  There is no murmur gallop rub or click.  There is no abnormal lift or heave.  The abdomen is soft and nontender.  The bowel sounds are normal.  The liver and spleen are not enlarged.  There are no abdominal masses.  There are no abdominal bruits.  Extremities reveal good pedal pulses.  There is no phlebitis or edema.  There is no cyanosis or clubbing.  Strength is normal and symmetrical in all extremities.  There is no lateralizing weakness.  There are no sensory deficits.  The skin is warm and dry.  There is no rash.      Assessment / Plan: 1.  Essential hypertension without heart failure 2. Hypercholesterolemia 3. asymptomatic right carotid bruit followed by vascular surgeons 4.  Status post partial thyroidectomy.  TSH levels are followed by her surgeon  Plan: Continue same medication.   Resume Crestor in the lower dose of just 10 mg daily.  Recheck in 4 months for followup office visit lipid panel hepatic function panel and basal metabolic panel.

## 2014-02-28 NOTE — Patient Instructions (Signed)
DECREASE CRESTOR TO 10 MG DAILY, RX SENT TO [facility]  Your physician wants you to follow-up in: 4 months with fasting labs (lp/bmet/hfp)  You will receive a reminder letter in the mail two months in advance. If you don't receive a letter, please call our office to schedule the follow-up appointment.

## 2014-02-28 NOTE — Assessment & Plan Note (Signed)
She was astounded to see how quickly her lipids became elevated after she stopped taking the Crestor 20 mg daily.  She had been experiencing some myalgias.  She then developed superimposed sciatica.  We are going to have her resume her Crestor but in the lower dose of just 10 mg daily.

## 2014-03-07 ENCOUNTER — Other Ambulatory Visit: Payer: Self-pay | Admitting: Cardiology

## 2014-03-07 ENCOUNTER — Other Ambulatory Visit: Payer: Self-pay | Admitting: Orthopaedic Surgery

## 2014-03-07 DIAGNOSIS — IMO0002 Reserved for concepts with insufficient information to code with codable children: Secondary | ICD-10-CM

## 2014-03-15 ENCOUNTER — Ambulatory Visit
Admission: RE | Admit: 2014-03-15 | Discharge: 2014-03-15 | Disposition: A | Discharge status: Home / Self Care | Payer: Medicare Other | Source: Ambulatory Visit | Attending: Orthopaedic Surgery | Admitting: Orthopaedic Surgery

## 2014-03-15 DIAGNOSIS — IMO0002 Reserved for concepts with insufficient information to code with codable children: Secondary | ICD-10-CM

## 2014-03-15 MED ORDER — IOHEXOL 180 MG/ML  SOLN
1.0000 mL | Freq: Once | INTRAMUSCULAR | Status: AC | PRN
  Administered 2014-03-15: 1 mL via EPIDURAL

## 2014-03-15 MED ORDER — TRIAMCINOLONE ACETONIDE 40 MG/ML IJ SUSP (RADIOLOGY)
40.0000 mg | Freq: Once | INTRAMUSCULAR | Status: AC
  Administered 2014-03-15: 40 mg via EPIDURAL

## 2014-03-15 NOTE — Discharge Instructions (Signed)

## 2014-04-05 ENCOUNTER — Other Ambulatory Visit: Payer: Self-pay

## 2014-04-05 MED ORDER — TELMISARTAN-HCTZ 80-25 MG PO TABS: 1.0000 | ORAL_TABLET | Freq: Every day | ORAL | Status: DC

## 2014-04-28 ENCOUNTER — Encounter: Payer: Self-pay | Admitting: Obstetrics & Gynecology

## 2014-04-28 ENCOUNTER — Ambulatory Visit (INDEPENDENT_AMBULATORY_CARE_PROVIDER_SITE_OTHER): Payer: Medicare Other | Admitting: Obstetrics & Gynecology

## 2014-04-28 VITALS — BP 116/60 | HR 60 | Resp 16 | Ht 62.5 in | Wt 189.8 lb

## 2014-04-28 DIAGNOSIS — E038 Other specified hypothyroidism: Secondary | ICD-10-CM

## 2014-04-28 DIAGNOSIS — Z0000 Encounter for general adult medical examination without abnormal findings: Secondary | ICD-10-CM

## 2014-04-28 DIAGNOSIS — E039 Hypothyroidism, unspecified: Secondary | ICD-10-CM

## 2014-04-28 DIAGNOSIS — Z01419 Encounter for gynecological examination (general) (routine) without abnormal findings: Secondary | ICD-10-CM

## 2014-04-28 LAB — POCT URINALYSIS DIPSTICK
Bilirubin, UA: NEGATIVE
Glucose, UA: NEGATIVE
KETONES UA: NEGATIVE
LEUKOCYTES UA: NEGATIVE
NITRITE UA: NEGATIVE
PH UA: 5
Protein, UA: NEGATIVE
RBC UA: NEGATIVE
Urobilinogen, UA: NEGATIVE

## 2014-04-28 MED ORDER — ESTRADIOL 0.5 MG PO TABS: 0.5000 mg | ORAL_TABLET | Freq: Every day | ORAL | Status: DC

## 2014-04-28 MED ORDER — LEVOTHYROXINE SODIUM 112 MCG PO TABS: 112.0000 ug | ORAL_TABLET | Freq: Every day | ORAL | Status: DC

## 2014-04-28 NOTE — Patient Instructions (Signed)

## 2014-04-28 NOTE — Progress Notes (Signed)
73 y.o. G2P2 MarriedCaucasianF here for annual exam.  Had three great grandchildren now.  Husband has advanced prostate cancer and pt really having to lift him.  Pt having some low back pain and has seen Dr. Durward Fortes.  Had a cortisone injection and this has really helped.    No vaginal bleeding.    Saw Dr. Mare Ferrari in June.  Cholesterol was elevated at 215 as she was off the back pain so she stopped the Crestor as a trial.    Patient's last menstrual period was 09/24/1979.          Sexually active: No.  The current method of family planning is status post hysterectomy.    Exercising: No.  not regularly Smoker:  Former smoker-years ago  Health Maintenance: Pap:  12/13/10 WNL History of abnormal Pap:  no MMG:  01/27/14-normal Colonoscopy:  8/14-no repeat, unless any problems BMD:   02/05/13, -1.2 spine, normal hip TDaP:  10/12 Screening Labs: PCP, Hb today: PCP, Urine today: negative   reports that she quit smoking about 38 years ago. She has never used smokeless tobacco. She reports that she drinks alcohol. She reports that she does not use illicit drugs.  Past Medical History  Diagnosis Date  . Hyperlipidemia   . Hypertension   . Carotid bruit   . Diverticulosis   . Arthritis   . GERD (gastroesophageal reflux disease)   . Thyroid disease 02-27-12    lt. thyroid nodule, bx. done 5 yrs ago-now some enlargement is seen.  . Injury of left leg 06/05/12    Pt. fell  . Peripheral vascular disease   . Back pain     had cortisone injection 6/15, Dr Durward Fortes    Past Surgical History  Procedure Laterality Date  . Colonoscopy    . Carotid endarterectomy  1998    Stable since surgery -slight build up returning-follows with yrly Dopplers  . Abdominal hysterectomy  01/1980  . Eye surgery  02-27-12    Bil. Cataract surgery  . Thyroid lobectomy  03/02/2012    Procedure: THYROID LOBECTOMY;  Surgeon: Earnstine Regal, MD;  Location: [facility] ORS;  Service: General;  Laterality: Left;  . Cataract  extraction, bilateral  2004 or 2005 per pt    Current Outpatient Prescriptions  Medication Sig Dispense Refill  . acetaminophen (TYLENOL) 500 MG tablet Take 500 mg by mouth 2 (two) times daily as needed for pain.      Marland Kitchen amLODipine (NORVASC) 2.5 MG tablet Take 1 tablet (2.5 mg total) by mouth daily with breakfast.  90 tablet  3  . aspirin 81 MG EC tablet Take 162 mg by mouth daily with breakfast. Taking 2 daily      . B Complex Vitamins (VITAMIN-B COMPLEX PO) Take by mouth daily with breakfast.       . CALCIUM PO Take by mouth daily with breakfast. 600mg       . estradiol (ESTRACE) 0.5 MG tablet Take 1 tablet (0.5 mg total) by mouth daily. Take half a tablet PO daily  45 tablet  4  . hydrochlorothiazide (HYDRODIURIL) 25 MG tablet       . levothyroxine (SYNTHROID, LEVOTHROID) 112 MCG tablet Take 1 tablet (112 mcg total) by mouth daily.  30 tablet  2  . metoprolol succinate (TOPROL-XL) 100 MG 24 hr tablet TAKE 1 TABLET BY MOUTH DAILY WITH BREAKFAST  30 tablet  3  . Multiple Vitamin (MULTIVITAMIN) tablet Take 1 tablet by mouth daily.        Marland Kitchen  ranitidine (ZANTAC) 150 MG tablet Take 150 mg by mouth as needed for heartburn.      . rosuvastatin (CRESTOR) 10 MG tablet Take 1 tablet (10 mg total) by mouth at bedtime.  90 tablet  3  . telmisartan (MICARDIS) 80 MG tablet       . verapamil (VERELAN PM) 240 MG 24 hr capsule Take 1 capsule (240 mg total) by mouth daily with breakfast.  30 capsule  11  . furosemide (LASIX) 40 MG tablet Take 40 mg by mouth as needed. For edema      . Loratadine (CLARITIN) 10 MG CAPS Take by mouth. As needed       No current facility-administered medications for this visit.    Family History  Problem Relation Age of Onset  . Heart disease Mother   . Hypertension Mother   . Arthritis Mother     ROS:  Pertinent items are noted in HPI.  Otherwise, a comprehensive ROS was negative.  Exam:   BP 116/60  Pulse 60  Resp 16  Ht 5' 2.5" (1.588 m)  Wt 189 lb 12.8 oz (86.093  kg)  BMI 34.14 kg/m2  LMP 09/24/1979  Height: 5' 2.5" (158.8 cm)  Ht Readings from Last 3 Encounters:  04/28/14 5' 2.5" (1.588 m)  02/28/14 5\' 3"  (1.6 m)  01/26/14 5\' 3"  (1.6 m)    General appearance: alert, cooperative and appears stated age Head: Normocephalic, without obvious abnormality, atraumatic Neck: no adenopathy, absent thyroid Lungs: clear to auscultation bilaterally Breasts: normal appearance, no masses or tenderness Heart: regular rate and rhythm Abdomen: soft, non-tender; bowel sounds normal; no masses,  no organomegaly Extremities: extremities normal, atraumatic, no cyanosis or edema Skin: Skin color, texture, turgor normal. No rashes or lesions Lymph nodes: Cervical, supraclavicular, and axillary nodes normal. No abnormal inguinal nodes palpated Neurologic: Grossly normal   Pelvic: External genitalia:  no lesions              Urethra:  normal appearing urethra with no masses, tenderness or lesions              Bartholins and Skenes: normal                 Vagina: normal appearing vagina with normal color and discharge, no lesions              Cervix: no lesions              Pap taken: No. Bimanual Exam:  Uterus:  normal size, contour, position, consistency, mobility, non-tender              Adnexa: normal adnexa and no mass, fullness, tenderness               Rectovaginal: Confirms               Anus:  normal sphincter tone, no lesions  A:  Well Woman with normal exam  PMP, on low dose HRT  H/O TVH, ovaries remain  Diverticulosis, chronic LLQ pain. (H/O soft tissue mass noted on U/S 2008. Dr. Cristina Gong feels this is diverticulosis related. Neg Ca-125).  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  Last pap smear 3/12. No Pap today.  Will plan one next year.  Pt and I discussed guidelines.  She and I agree to not "stop" but do a pap with much less  frequency.  TSH today. If normal, will need to continue 155mcg qday.  #90/4RF  Rx for Estradiol 0.5mg  1/2 tablet daily.  #45/4RF.  return annually or prn  An After Visit Summary was printed and given to the patient.

## 2014-04-29 LAB — TSH: TSH: 3.462 u[IU]/mL (ref 0.350–4.500)

## 2014-06-28 ENCOUNTER — Other Ambulatory Visit: Payer: Self-pay | Admitting: *Deleted

## 2014-06-28 MED ORDER — VERAPAMIL HCL ER 240 MG PO CP24: 240.0000 mg | ORAL_CAPSULE | Freq: Every day | ORAL | Status: DC

## 2014-06-28 MED ORDER — AMLODIPINE BESYLATE 2.5 MG PO TABS: 2.5000 mg | ORAL_TABLET | Freq: Every day | ORAL | Status: DC

## 2014-07-05 ENCOUNTER — Other Ambulatory Visit: Payer: Self-pay | Admitting: Cardiology

## 2014-07-07 ENCOUNTER — Other Ambulatory Visit (INDEPENDENT_AMBULATORY_CARE_PROVIDER_SITE_OTHER): Payer: Medicare Other | Admitting: *Deleted

## 2014-07-07 DIAGNOSIS — I119 Hypertensive heart disease without heart failure: Secondary | ICD-10-CM

## 2014-07-07 DIAGNOSIS — E785 Hyperlipidemia, unspecified: Secondary | ICD-10-CM

## 2014-07-07 LAB — HEPATIC FUNCTION PANEL
ALBUMIN: 3.7 g/dL (ref 3.5–5.2)
ALK PHOS: 59 U/L (ref 39–117)
ALT: 28 U/L (ref 0–35)
AST: 22 U/L (ref 0–37)
BILIRUBIN DIRECT: 0.1 mg/dL (ref 0.0–0.3)
TOTAL PROTEIN: 7.6 g/dL (ref 6.0–8.3)
Total Bilirubin: 0.9 mg/dL (ref 0.2–1.2)

## 2014-07-07 LAB — LIPID PANEL
CHOLESTEROL: 169 mg/dL (ref 0–200)
HDL: 32 mg/dL — AB (ref >39.00)
LDL Cholesterol: 104 mg/dL — ABNORMAL HIGH (ref 0–99)
NonHDL: 137
TRIGLYCERIDES: 164 mg/dL — AB (ref 0.0–149.0)
Total CHOL/HDL Ratio: 5
VLDL: 32.8 mg/dL (ref 0.0–40.0)

## 2014-07-07 LAB — BASIC METABOLIC PANEL
BUN: 19 mg/dL (ref 6–23)
CALCIUM: 9.2 mg/dL (ref 8.4–10.5)
CHLORIDE: 103 meq/L (ref 96–112)
CO2: 25 mEq/L (ref 19–32)
CREATININE: 1 mg/dL (ref 0.4–1.2)
GFR: 58.1 mL/min — AB (ref >60.00)
Glucose, Bld: 88 mg/dL (ref 70–99)
Potassium: 4 mEq/L (ref 3.5–5.1)
Sodium: 137 mEq/L (ref 135–145)

## 2014-07-10 NOTE — Progress Notes (Signed)
Quick Note:  Please make copy of labs for patient visit. ______ 

## 2014-07-11 ENCOUNTER — Encounter: Payer: Self-pay | Admitting: Cardiology

## 2014-07-11 ENCOUNTER — Ambulatory Visit (INDEPENDENT_AMBULATORY_CARE_PROVIDER_SITE_OTHER): Payer: Medicare Other | Admitting: Cardiology

## 2014-07-11 VITALS — BP 128/64 | HR 68 | Ht 63.0 in | Wt 189.0 lb

## 2014-07-11 DIAGNOSIS — I119 Hypertensive heart disease without heart failure: Secondary | ICD-10-CM

## 2014-07-11 DIAGNOSIS — Z8679 Personal history of other diseases of the circulatory system: Secondary | ICD-10-CM

## 2014-07-11 DIAGNOSIS — E785 Hyperlipidemia, unspecified: Secondary | ICD-10-CM

## 2014-07-11 MED ORDER — ROSUVASTATIN CALCIUM 20 MG PO TABS: 20.0000 mg | ORAL_TABLET | Freq: Every day | ORAL | Status: DC

## 2014-07-11 NOTE — Assessment & Plan Note (Signed)
No headaches.  No shortness of breath.

## 2014-07-11 NOTE — Assessment & Plan Note (Signed)
Her lipids are not as good as last time.  She thinks that in retrospect her leg symptoms were not coming from her statin.  They were coming from her sciatica.  We will increase her Crestor back to previous dose of 20 mg daily

## 2014-07-11 NOTE — Assessment & Plan Note (Signed)
The patient has not been having a TIA symptoms

## 2014-07-11 NOTE — Progress Notes (Signed)
[name] Date of Birth:  06/25/1941 [facility] [address] [place], [place]  [address] [phone]  Fax   [phone]  HPI: This pleasant 73 year old woman is seen for a scheduled four-month followup office visit. She has a past history of dyslipidemia. She has a history of known residual right carotid bruit after remote right carotid endarterectomy. She's had a recent carotid Doppler which showed evidence of plaque reduction and excellent flow. . She does not have any history of ischemic heart disease. She had a normal nuclear stress test in 2005. She has had essential hypertension and exogenous obesity. She is euthyroid on levothyroxine.  Since last visit she had a ruptured Baker's cyst of her right knee.  She saw Dr. Joni Fears and received an injection which has helped.  She has also used Voltaren gel with benefit.  The patient is no longer having sciatica after 1 injection earlier this summer by Dr. Jola Baptist   Current Outpatient Prescriptions  Medication Sig Dispense Refill  . acetaminophen (TYLENOL) 500 MG tablet Take 500 mg by mouth 2 (two) times daily as needed for pain.      Marland Kitchen amLODipine (NORVASC) 2.5 MG tablet Take 1 tablet (2.5 mg total) by mouth daily with breakfast.  90 tablet  0  . aspirin 81 MG EC tablet Take 162 mg by mouth daily with breakfast. Taking 2 daily      . B Complex Vitamins (VITAMIN-B COMPLEX PO) Take by mouth daily with breakfast.       . CALCIUM PO Take by mouth daily with breakfast. 600mg       . estradiol (ESTRACE) 0.5 MG tablet Take 1 tablet (0.5 mg total) by mouth daily. Take half a tablet PO daily  45 tablet  4  . furosemide (LASIX) 40 MG tablet Take 40 mg by mouth as needed. For edema      . hydrochlorothiazide (HYDRODIURIL) 25 MG tablet Take 25 mg by mouth daily.       Marland Kitchen levothyroxine (SYNTHROID, LEVOTHROID) 112 MCG tablet Take 1 tablet (112 mcg total) by mouth daily.  90 tablet  4  . Loratadine (CLARITIN) 10 MG CAPS  Take by mouth. As needed      . metoprolol succinate (TOPROL-XL) 100 MG 24 hr tablet TAKE 1 TABLET BY MOUTH DAILY WITH BREAKFAST  30 tablet  11  . Multiple Vitamin (MULTIVITAMIN) tablet Take 1 tablet by mouth daily.        . ranitidine (ZANTAC) 150 MG tablet Take 150 mg by mouth as needed for heartburn.      . rosuvastatin (CRESTOR) 20 MG tablet Take 1 tablet (20 mg total) by mouth daily with breakfast.  90 tablet  3  . telmisartan (MICARDIS) 80 MG tablet Take 80 mg by mouth daily.       . verapamil (VERELAN PM) 240 MG 24 hr capsule Take 1 capsule (240 mg total) by mouth daily with breakfast.  30 capsule  0   No current facility-administered medications for this visit.    Allergies  Allergen Reactions  . Zebeta Other (See Comments)    Does not work, per patient.  . Ace Inhibitors Other (See Comments)    cough    Patient Active Problem List   Diagnosis Date Noted  . Occlusion and stenosis of carotid artery without mention of cerebral infarction 06/10/2012  . 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 Dyslipidemia 04/04/2011  . Benign hypertensive heart disease without heart failure 04/04/2011  History  Smoking status  . Former Smoker  . Quit date: 09/24/1975  Smokeless tobacco  . Never Used    History  Alcohol Use  . 0.0 oz/week  . 1-2 Glasses of wine per week    Comment: 1-2 drinks a week    Family History  Problem Relation Age of Onset  . Heart disease Mother   . Hypertension Mother   . Arthritis Mother     Review of Systems: The patient denies any heat or cold intolerance.  No weight gain or weight loss.  The patient denies headaches or blurry vision.  There is no cough or sputum production.  The patient denies dizziness.  There is no hematuria or hematochezia.  The patient denies any muscle aches or arthritis.  The patient denies any rash.  The patient denies frequent falling or instability.  There is no history of depression or anxiety.  All other systems were  reviewed and are negative.   Physical Exam: Filed Vitals:   07/11/14 0854  BP: 128/64  Pulse: 68   the general appearance reveals a well-developed well-nourished mildly overweight woman in no acute distress.The head and neck exam reveals pupils equal and reactive.  Extraocular movements are full.  There is no scleral icterus.  The mouth and pharynx are normal.  The neck is supple.  The carotids reveal soft high-pitched right carotid bruits.  The jugular venous pressure is normal.  The  thyroid is not enlarged.  There is no lymphadenopathy.  The chest is clear to percussion and auscultation.  There are no rales or rhonchi.  Expansion of the chest is symmetrical.  The precordium is quiet.  The first heart sound is normal.  The second heart sound is physiologically split.  There is no murmur gallop rub or click.  There is no abnormal lift or heave.  The abdomen is soft and nontender.  The bowel sounds are normal.  The liver and spleen are not enlarged.  There are no abdominal masses.  There are no abdominal bruits.  Extremities reveal good pedal pulses.  There is no phlebitis or edema.  There is no cyanosis or clubbing.  Strength is normal and symmetrical in all extremities.  There is no lateralizing weakness.  There are no sensory deficits.  The skin is warm and dry.  There is no rash.      Assessment / Plan: 1.  Essential hypertension without heart failure 2. Hypercholesterolemia 3. asymptomatic right carotid bruit followed by vascular surgeons 4.  Status post partial thyroidectomy.  TSH levels are followed by her surgeon  Plan: Continue same medication.  Increase Crestor back to 20 mg daily.  Recheck in 4 months for followup office visit lipid panel hepatic function panel and basal metabolic panel.

## 2014-07-11 NOTE — Patient Instructions (Signed)
INCREASE YOUR CRESTOR TO 20 MG DAILY, RX SENT TO PHARMACY  Your physician wants you to follow-up in: 4 months with fasting labs (lp/bmet/hfp) You will receive a reminder letter in the mail two months in advance. If you don't receive a letter, please call our office to schedule the follow-up appointment.

## 2014-07-25 ENCOUNTER — Other Ambulatory Visit: Payer: Self-pay | Admitting: Cardiology

## 2014-07-25 ENCOUNTER — Encounter: Payer: Self-pay | Admitting: Cardiology

## 2014-08-24 ENCOUNTER — Telehealth: Payer: Self-pay | Admitting: *Deleted

## 2014-08-24 NOTE — Telephone Encounter (Signed)
New Message  Pt received vacc at [facility] Arelia Sneddon MD) in early October.

## 2014-09-26 ENCOUNTER — Other Ambulatory Visit: Payer: Self-pay | Admitting: Cardiology

## 2014-10-27 ENCOUNTER — Other Ambulatory Visit: Payer: Self-pay | Admitting: Cardiology

## 2014-11-11 ENCOUNTER — Encounter: Payer: Self-pay | Admitting: Cardiology

## 2014-11-11 ENCOUNTER — Ambulatory Visit (INDEPENDENT_AMBULATORY_CARE_PROVIDER_SITE_OTHER): Payer: Medicare Other | Admitting: Cardiology

## 2014-11-11 ENCOUNTER — Other Ambulatory Visit (INDEPENDENT_AMBULATORY_CARE_PROVIDER_SITE_OTHER): Payer: Medicare Other | Admitting: *Deleted

## 2014-11-11 VITALS — BP 122/58 | HR 53 | Ht 63.0 in | Wt 182.8 lb

## 2014-11-11 DIAGNOSIS — E785 Hyperlipidemia, unspecified: Secondary | ICD-10-CM

## 2014-11-11 DIAGNOSIS — I119 Hypertensive heart disease without heart failure: Secondary | ICD-10-CM

## 2014-11-11 LAB — HEPATIC FUNCTION PANEL
ALT: 24 U/L (ref 0–35)
AST: 22 U/L (ref 0–37)
Albumin: 4.1 g/dL (ref 3.5–5.2)
Alkaline Phosphatase: 61 U/L (ref 39–117)
BILIRUBIN TOTAL: 0.6 mg/dL (ref 0.2–1.2)
Bilirubin, Direct: 0.2 mg/dL (ref 0.0–0.3)
Total Protein: 7.1 g/dL (ref 6.0–8.3)

## 2014-11-11 LAB — BASIC METABOLIC PANEL
BUN: 23 mg/dL (ref 6–23)
CO2: 28 meq/L (ref 19–32)
Calcium: 9.4 mg/dL (ref 8.4–10.5)
Chloride: 103 mEq/L (ref 96–112)
Creatinine, Ser: 0.95 mg/dL (ref 0.40–1.20)
GFR: 60.88 mL/min (ref >60.00)
GLUCOSE: 99 mg/dL (ref 70–99)
Potassium: 3.9 mEq/L (ref 3.5–5.1)
Sodium: 137 mEq/L (ref 135–145)

## 2014-11-11 LAB — LIPID PANEL
Cholesterol: 136 mg/dL (ref 0–200)
HDL: 36 mg/dL — AB (ref >39.00)
LDL Cholesterol: 69 mg/dL (ref 0–99)
NONHDL: 100
TRIGLYCERIDES: 156 mg/dL — AB (ref 0.0–149.0)
Total CHOL/HDL Ratio: 4
VLDL: 31.2 mg/dL (ref 0.0–40.0)

## 2014-11-11 NOTE — Patient Instructions (Signed)
Your physician recommends that you continue on your current medications as directed. Please refer to the Current Medication list given to you today.  Your physician recommends that you schedule a follow-up appointment in: 4 months with fasting labs (lp/bmet/hfp)  

## 2014-11-11 NOTE — Progress Notes (Signed)
Cardiology Office Note   Date:  11/11/2014   ID:  CONSUELO SUTHERS, DOB [birth year], MRN [id number]  PCP:  Darlin Coco, MD  Cardiologist:   Darlin Coco, MD   No chief complaint on file.     History of Present Illness: [name] is a 74 y.o. female who presents for a four-month follow-up office visit.  This pleasant 74 year old woman is seen for a scheduled four-month followup office visit. She has a past history of dyslipidemia. She has a history of known residual right carotid bruit after remote right carotid endarterectomy. She's had a recent carotid Doppler which showed evidence of plaque reduction and excellent flow. . She does not have any history of ischemic heart disease. She had a normal nuclear stress test in 2005. She has had essential hypertension and exogenous obesity. She is euthyroid on levothyroxine. Since last visit she had a ruptured Baker's cyst of her right knee. She saw Dr. Joni Fears and received an injection which has helped. She has also used Voltaren gel with benefit. The patient is no longer having sciatica after 1 injection earlier this summer by Dr. Jola Baptist. 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  She is under more stress.  Home health has been coming in.  Past Medical History  Diagnosis Date  . Hyperlipidemia   . Hypertension   . Carotid bruit   . Diverticulosis   . Arthritis   . GERD (gastroesophageal reflux disease)   . Thyroid disease 02-27-12    lt. thyroid nodule, bx. done 5 yrs ago-now some enlargement is seen.  . Injury of left leg 06/05/12    Pt. fell  . Peripheral vascular disease   . Back pain     had cortisone injection 6/15, Dr Durward Fortes    Past Surgical History  Procedure Laterality Date  . Colonoscopy    . Carotid endarterectomy  1998    Stable since surgery -slight build up returning-follows with yrly Dopplers  . Abdominal hysterectomy  01/1980   . Eye surgery  02-27-12    Bil. Cataract surgery  . Thyroid lobectomy  03/02/2012    Procedure: THYROID LOBECTOMY;  Surgeon: Earnstine Regal, MD;  Location: [facility];  Service: General;  Laterality: Left;  . Cataract extraction, bilateral  2004 or 2005 per pt     Current Outpatient Prescriptions  Medication Sig Dispense Refill  . acetaminophen (TYLENOL) 500 MG tablet Take 500 mg by mouth 2 (two) times daily as needed for pain.    Marland Kitchen amLODipine (NORVASC) 2.5 MG tablet TAKE 1 TABLET BY MOUTH DAILY WITH BREAKFAST 90 tablet 1  . aspirin 81 MG EC tablet Take 162 mg by mouth daily with breakfast. Taking 2 daily    . B Complex Vitamins (VITAMIN-B COMPLEX PO) Take by mouth daily with breakfast.     . CALCIUM PO Take by mouth daily with breakfast. 600mg     . estradiol (ESTRACE) 0.5 MG tablet Take 1 tablet (0.5 mg total) by mouth daily. Take half a tablet PO daily 45 tablet 4  . furosemide (LASIX) 40 MG tablet Take 40 mg by mouth as needed. For edema    . hydrochlorothiazide (HYDRODIURIL) 25 MG tablet TAKE 1 TABLET BY MOUTH DAILY WITH BREAKFAST 30 tablet 2  . KRILL OIL PO Take by mouth daily.    Marland Kitchen levothyroxine (SYNTHROID, LEVOTHROID) 112 MCG tablet Take 1 tablet (112 mcg total) by mouth daily.  90 tablet 4  . Loratadine (CLARITIN) 10 MG CAPS Take by mouth. As needed    . metoprolol succinate (TOPROL-XL) 100 MG 24 hr tablet TAKE 1 TABLET BY MOUTH DAILY WITH BREAKFAST 30 tablet 11  . Multiple Vitamin (MULTIVITAMIN) tablet Take 1 tablet by mouth daily.      . ranitidine (ZANTAC) 150 MG tablet Take 150 mg by mouth as needed for heartburn.    . rosuvastatin (CRESTOR) 20 MG tablet Take 1 tablet (20 mg total) by mouth daily with breakfast. 90 tablet 3  . telmisartan (MICARDIS) 80 MG tablet TAKE 1 TABLET BY MOUTH DAILY WITH BREAKFAST 30 tablet 2  . verapamil (CALAN-SR) 240 MG CR tablet TAKE 1 TABLET BY MOUTH DAILY WITH BREAKFAST 30 tablet 11  . verapamil (VERELAN PM) 240 MG 24 hr capsule Take 1 capsule (240 mg  total) by mouth daily with breakfast. 30 capsule 0   No current facility-administered medications for this visit.    Allergies:   Zebeta and Ace inhibitors    Social History:  The patient  reports that she quit smoking about 39 years ago. She has never used smokeless tobacco. She reports that she drinks alcohol. She reports that she does not use illicit drugs.   Family History:  The patient's family history includes Arthritis in her mother; Heart disease in her mother; Hypertension in her mother.    ROS:  Please see the history of present illness.   Otherwise, review of systems are positive for none.   All other systems are reviewed and negative.    PHYSICAL EXAM: VS:  BP 122/58 mmHg  Pulse 53  Ht 5\' 3"  (1.6 m)  Wt 182 lb 12.8 oz (82.918 kg)  BMI 32.39 kg/m2  LMP 09/24/1979 , BMI Body mass index is 32.39 kg/(m^2). GEN: Well nourished, well developed, in no acute distress HEENT: normal Neck: no JVD, carotid bruits, or masses.  Right carotid endarterectomy scar. Cardiac: RRR; no murmurs, rubs, or gallops,no edema  Respiratory:  clear to auscultation bilaterally, normal work of breathing GI: soft, nontender, nondistended, + BS MS: no deformity or atrophy Skin: warm and dry, no rash Neuro:  Strength and sensation are intact Psych: euthymic mood, full affect   EKG:  EKG is ordered today. The ekg ordered today demonstrates sinus bradycardia with sinus arrhythmia and no ischemic changes.   Recent Labs: 04/28/2014: TSH 3.462 07/07/2014: ALT 28; BUN 19; Creatinine 1.0; Potassium 4.0; Sodium 137    Lipid Panel    Component Value Date/Time   CHOL 169 07/07/2014 1044   TRIG 164.0* 07/07/2014 1044   HDL 32.00* 07/07/2014 1044   CHOLHDL 5 07/07/2014 1044   VLDL 32.8 07/07/2014 1044   LDLCALC 104* 07/07/2014 1044   LDLDIRECT 73.4 01/07/2013 0939      Wt Readings from Last 3 Encounters:  11/11/14 182 lb 12.8 oz (82.918 kg)  07/11/14 189 lb (85.73 kg)  04/28/14 189 lb 12.8 oz  (86.093 kg)         ASSESSMENT AND PLAN:  1. Essential hypertension without heart failure 2. Hypercholesterolemia 3. asymptomatic right carotid bruit followed by vascular surgeons 4. Status post partial thyroidectomy. TSH levels are followed by her surgeon   Current medicines are reviewed at length with the patient today.  The patient does not have concerns regarding medicines.  The following changes have been made:  no change    Orders Placed This Encounter  Procedures  . Lipid panel  . Hepatic function panel  . Basic  metabolic panel  . EKG 12-Lead     Disposition:   FU with Dr. Mare Ferrari in 4 months for office visit lipid panel hepatic function panel and basal metabolic panel   Signed, Darlin Coco, MD  11/11/2014 1:40 PM    [facility] [place], [place], [place]  [address] Phone: [phone]; Fax: [phone]

## 2014-11-11 NOTE — Progress Notes (Signed)
Quick Note:  Please report to patient. The recent labs are stable. Continue same medication and careful diet. ______ 

## 2014-11-15 ENCOUNTER — Ambulatory Visit: Payer: Medicare Other | Admitting: Cardiology

## 2014-12-26 ENCOUNTER — Other Ambulatory Visit: Payer: Self-pay

## 2014-12-26 DIAGNOSIS — Z1231 Encounter for screening mammogram for malignant neoplasm of breast: Secondary | ICD-10-CM

## 2015-01-30 ENCOUNTER — Ambulatory Visit: Payer: Medicare Other

## 2015-01-30 ENCOUNTER — Other Ambulatory Visit: Payer: Self-pay | Admitting: Cardiology

## 2015-02-03 ENCOUNTER — Ambulatory Visit
Admission: RE | Admit: 2015-02-03 | Discharge: 2015-02-03 | Disposition: A | Discharge status: Home / Self Care | Payer: Medicare Other | Source: Ambulatory Visit

## 2015-02-03 DIAGNOSIS — Z1231 Encounter for screening mammogram for malignant neoplasm of breast: Secondary | ICD-10-CM

## 2015-02-06 ENCOUNTER — Encounter: Payer: Self-pay | Admitting: Vascular Surgery

## 2015-02-08 ENCOUNTER — Encounter: Payer: Self-pay | Admitting: Vascular Surgery

## 2015-02-08 ENCOUNTER — Ambulatory Visit (INDEPENDENT_AMBULATORY_CARE_PROVIDER_SITE_OTHER): Payer: Medicare Other | Admitting: Vascular Surgery

## 2015-02-08 ENCOUNTER — Ambulatory Visit (HOSPITAL_COMMUNITY)
Admission: RE | Admit: 2015-02-08 | Discharge: 2015-02-08 | Disposition: A | Discharge status: Home / Self Care | Payer: Medicare Other | Source: Ambulatory Visit | Attending: Vascular Surgery | Admitting: Vascular Surgery

## 2015-02-08 VITALS — BP 137/56 | HR 77 | Resp 16 | Ht 63.5 in | Wt 181.0 lb

## 2015-02-08 DIAGNOSIS — I6523 Occlusion and stenosis of bilateral carotid arteries: Secondary | ICD-10-CM | POA: Diagnosis not present

## 2015-02-08 NOTE — Progress Notes (Signed)
HISTORY AND PHYSICAL     CC:  F/u carotid duplex Referring Provider:  Darlin Coco, MD  HPI: This is a 74 y.o. female who has hx of right carotid endarterectomy.  She denies any amaurosis fugax, hemiparesis or difficulty with speech.  She does take a statin for her cholesterol.  She started taking Krill oil [facility]) and according to her lab values, these did improve.  She is on a beta blocker for her hypertension.    Past Medical History  Diagnosis Date  . Hyperlipidemia   . Hypertension   . Carotid bruit   . Diverticulosis   . Arthritis   . GERD (gastroesophageal reflux disease)   . Thyroid disease 02-27-12    lt. thyroid nodule, bx. done 5 yrs ago-now some enlargement is seen.  . Injury of left leg 06/05/12    Pt. fell  . Peripheral vascular disease   . Back pain     had cortisone injection 6/15, Dr Durward Fortes  . Carotid artery occlusion     Past Surgical History  Procedure Laterality Date  . Colonoscopy    . Carotid endarterectomy  1998    Stable since surgery -slight build up returning-follows with yrly Dopplers  . Abdominal hysterectomy  01/1980  . Eye surgery  02-27-12    Bil. Cataract surgery  . Thyroid lobectomy  03/02/2012    Procedure: THYROID LOBECTOMY;  Surgeon: Earnstine Regal, MD;  Location: [facility];  Service: General;  Laterality: Left;  . Cataract extraction, bilateral  2004 or 2005 per pt    Allergies  Allergen Reactions  . Zebeta Other (See Comments)    Does not work, per patient.  . Ace Inhibitors Other (See Comments)    cough    Current Outpatient Prescriptions  Medication Sig Dispense Refill  . acetaminophen (TYLENOL) 500 MG tablet Take 500 mg by mouth 2 (two) times daily as needed for pain.    Marland Kitchen amLODipine (NORVASC) 2.5 MG tablet TAKE 1 TABLET BY MOUTH DAILY WITH BREAKFAST 90 tablet 1  . aspirin 81 MG EC tablet Take 162 mg by mouth daily with breakfast. Taking 2 daily    . B Complex Vitamins (VITAMIN-B COMPLEX PO) Take by mouth daily with  breakfast.     . CALCIUM PO Take by mouth daily with breakfast. 600mg     . estradiol (ESTRACE) 0.5 MG tablet Take 1 tablet (0.5 mg total) by mouth daily. Take half a tablet PO daily 45 tablet 4  . hydrochlorothiazide (HYDRODIURIL) 25 MG tablet TAKE 1 TABLET BY MOUTH DAILY WITH BREAKFAST 30 tablet 1  . KRILL OIL PO Take by mouth daily.    Marland Kitchen levothyroxine (SYNTHROID, LEVOTHROID) 112 MCG tablet Take 1 tablet (112 mcg total) by mouth daily. 90 tablet 4  . Loratadine (CLARITIN) 10 MG CAPS Take by mouth. As needed    . metoprolol succinate (TOPROL-XL) 100 MG 24 hr tablet TAKE 1 TABLET BY MOUTH DAILY WITH BREAKFAST 30 tablet 11  . Multiple Vitamin (MULTIVITAMIN) tablet Take 1 tablet by mouth daily.      . ranitidine (ZANTAC) 150 MG tablet Take 150 mg by mouth as needed for heartburn.    . rosuvastatin (CRESTOR) 20 MG tablet Take 1 tablet (20 mg total) by mouth daily with breakfast. 90 tablet 3  . telmisartan (MICARDIS) 80 MG tablet TAKE 1 TABLET BY MOUTH DAILY WITH BREAKFAST 30 tablet 1  . verapamil (VERELAN PM) 240 MG 24 hr capsule Take 1 capsule (240 mg total) by mouth daily  with breakfast. 30 capsule 0  . furosemide (LASIX) 40 MG tablet Take 40 mg by mouth as needed. For edema    . verapamil (CALAN-SR) 240 MG CR tablet TAKE 1 TABLET BY MOUTH DAILY WITH BREAKFAST (Patient not taking: Reported on 02/08/2015) 30 tablet 11   No current facility-administered medications for this visit.    Family History  Problem Relation Age of Onset  . Heart disease Mother   . Hypertension Mother   . Arthritis Mother     History   Social History  . Marital Status: Married    Spouse Name: N/A  . Number of Children: N/A  . Years of Education: N/A   Occupational History  . Not on file.   Social History Main Topics  . Smoking status: Former Smoker    Quit date: 09/24/1975  . Smokeless tobacco: Never Used  . Alcohol Use: 0.0 oz/week    1-2 Glasses of wine per week     Comment: 1-2 drinks a week  . Drug  Use: No  . Sexual Activity: No     Comment: hysterectomy   Other Topics Concern  . Not on file   Social History Narrative     ROS: [x]  Positive   [ ]  Negative   [x ] All sytems reviewed and are negative  Cardiovascular: []  chest pain/pressure []  palpitations []  SOB lying flat []  DOE []  pain in legs while walking []  pain in feet when lying flat []  hx of DVT []  hx of phlebitis []  swelling in legs []  varicose veins  Pulmonary: []  productive cough []  asthma []  wheezing  Neurologic: []  weakness in []  arms []  legs []  numbness in []  arms []  legs [] difficulty speaking or slurred speech []  temporary loss of vision in one eye []  dizziness  Hematologic: []  bleeding problems []  problems with blood clotting easily  GI []  vomiting blood []  blood in stool  GU: []  burning with urination []  blood in urine  Psychiatric: []  hx of major depression  Integumentary: []  rashes []  ulcers  Constitutional: []  fever []  chills   PHYSICAL EXAMINATION:  Filed Vitals:   02/08/15 1623  BP: 137/56  Pulse: 77  Resp:    Body mass index is 31.56 kg/(m^2).  General:  WDWN in NAD Gait: Normal HENT: WNL, normocephalic Pulmonary: normal non-labored breathing , without Rales, rhonchi,  wheezing Cardiac: RRR, without  Murmurs, rubs or gallops; with right carotid bruit Abdomen: soft, NT, no masses Skin: without rashes, without ulcers  Vascular Exam/Pulses:  Right Left  Radial 2+ (normal) 2+ (normal)  DP Unable to palpate  2+ (normal)  PT Unable to palpate  Unable to palpate    Extremities: without ischemic changes, without Gangrene , without cellulitis; without open wounds;  Musculoskeletal: no muscle wasting or atrophy  Neurologic: A&O X 3; Appropriate Affect ; SENSATION: normal; MOTOR FUNCTION:  moving all extremities equally. Speech is fluent/normal   Non-Invasive Vascular Imaging:   Carotid duplex 02/08/15: 1.  Patent right carotid endarterectomy site with  velocities suggestive of 50-69% 2.  Doppler velocities suggests a 1-49% stenosis of the left proximal ICA however, this increase in velocity appears to be due to a greater than 50% stenosis of the left distal CCA/bifurcation region  **No significant change in the maximum bilateral carotid artery velocities noted when compared to the previous exam on 01/26/14  Pt meds includes: Statin:  Yes.   Beta Blocker:  Yes.   Aspirin:  Yes.   ACEI:  No. ARB:  No. Other Antiplatelet/Anticoagulant:  No.    ASSESSMENT/PLAN:: 74 y.o. female with hx of right carotid endarterectomy and bilateral carotid artery stenosis   -pt is doing well and is asymptomatic and her carotid duplex is essentially unchanged -she will f/u in one year with a carotid duplex  -she will f/u sooner if she has problems   Leontine Locket, PA-C [facility] [phone]  Clinic MD:  Pt seen and examined in conjunction with Dr. Scot Dock

## 2015-02-09 NOTE — Addendum Note (Signed)
Addended by: Mena Goes on: 02/09/2015 09:59 AM   Modules accepted: Orders

## 2015-03-10 ENCOUNTER — Other Ambulatory Visit: Payer: Medicare Other

## 2015-03-13 ENCOUNTER — Ambulatory Visit: Payer: Medicare Other | Admitting: Cardiology

## 2015-03-30 ENCOUNTER — Other Ambulatory Visit: Payer: Self-pay | Admitting: Cardiology

## 2015-03-31 ENCOUNTER — Other Ambulatory Visit (INDEPENDENT_AMBULATORY_CARE_PROVIDER_SITE_OTHER): Payer: Medicare Other | Admitting: *Deleted

## 2015-03-31 DIAGNOSIS — I119 Hypertensive heart disease without heart failure: Secondary | ICD-10-CM

## 2015-03-31 DIAGNOSIS — E785 Hyperlipidemia, unspecified: Secondary | ICD-10-CM | POA: Diagnosis not present

## 2015-03-31 LAB — LIPID PANEL
CHOLESTEROL: 146 mg/dL (ref 0–200)
HDL: 31.2 mg/dL — ABNORMAL LOW (ref >39.00)
LDL CALC: 80 mg/dL (ref 0–99)
NonHDL: 114.8
TRIGLYCERIDES: 176 mg/dL — AB (ref 0.0–149.0)
Total CHOL/HDL Ratio: 5
VLDL: 35.2 mg/dL (ref 0.0–40.0)

## 2015-03-31 LAB — BASIC METABOLIC PANEL
BUN: 21 mg/dL (ref 6–23)
CHLORIDE: 104 meq/L (ref 96–112)
CO2: 29 mEq/L (ref 19–32)
Calcium: 9.4 mg/dL (ref 8.4–10.5)
Creatinine, Ser: 1.01 mg/dL (ref 0.40–1.20)
GFR: 56.66 mL/min — AB (ref >60.00)
Glucose, Bld: 84 mg/dL (ref 70–99)
POTASSIUM: 4.4 meq/L (ref 3.5–5.1)
Sodium: 139 mEq/L (ref 135–145)

## 2015-03-31 LAB — HEPATIC FUNCTION PANEL
ALBUMIN: 4.1 g/dL (ref 3.5–5.2)
ALK PHOS: 64 U/L (ref 39–117)
ALT: 24 U/L (ref 0–35)
AST: 21 U/L (ref 0–37)
Bilirubin, Direct: 0.1 mg/dL (ref 0.0–0.3)
TOTAL PROTEIN: 7 g/dL (ref 6.0–8.3)
Total Bilirubin: 0.6 mg/dL (ref 0.2–1.2)

## 2015-03-31 NOTE — Addendum Note (Signed)
Addended by: Eulis Foster on: 03/31/2015 10:32 AM   Modules accepted: Orders

## 2015-04-02 NOTE — Progress Notes (Signed)
Quick Note:  Please make copy of labs for patient visit. ______ 

## 2015-04-03 ENCOUNTER — Encounter: Payer: Self-pay | Admitting: Cardiology

## 2015-04-03 ENCOUNTER — Ambulatory Visit (INDEPENDENT_AMBULATORY_CARE_PROVIDER_SITE_OTHER): Payer: Medicare Other | Admitting: Cardiology

## 2015-04-03 VITALS — BP 120/60 | HR 58 | Ht 63.5 in | Wt 180.0 lb

## 2015-04-03 DIAGNOSIS — I6523 Occlusion and stenosis of bilateral carotid arteries: Secondary | ICD-10-CM | POA: Diagnosis not present

## 2015-04-03 DIAGNOSIS — E7800 Pure hypercholesterolemia, unspecified: Secondary | ICD-10-CM

## 2015-04-03 DIAGNOSIS — I119 Hypertensive heart disease without heart failure: Secondary | ICD-10-CM

## 2015-04-03 NOTE — Progress Notes (Signed)
Cardiology Office Note   Date:  04/03/2015   ID:  SABRYNA LAHM, DOB 1940-11-26, MRN [id number]  PCP:  Warren Danes, MD  Cardiologist: Darlin Coco MD  Chief Complaint  Patient presents with  . Bleeding/Bruising      History of Present Illness: [name] is a 74 y.o. female who presents for a four-month follow-up office visit.  This pleasant 74 year old woman is seen for a scheduled four-month followup office visit. She has a past history of dyslipidemia. She has a history of known residual right carotid bruit after remote right carotid endarterectomy. She's had a recent carotid Doppler which showed evidence of plaque reduction and excellent flow. . She does not have any history of ischemic heart disease. She had a normal nuclear stress test in 2005. She has had essential hypertension and exogenous obesity. She is euthyroid on levothyroxine. Since last visit she had a ruptured Baker's cyst of her right knee. She saw Dr. Joni Fears and received an injection which has helped. She has also used Voltaren gel with benefit. The patient is no longer having sciatica after 1 injection earlier this summer by Dr. Jola Baptist. 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 She is under more stress. Home health has been coming in.  She also has home instead coming in. She has been encouraged that her husband has been able to make it to church for the last 3 Sundays. [name] has been feeling well with no new cardiac symptoms.  No TIA symptoms.  No chest pain or shortness of breath.  She does not get much intentional exercise but stays busy as the caregiver for her husband.  She does have a treadmill out in the basement that she intends to start using.  Past Medical History  Diagnosis Date  . Hyperlipidemia   . Hypertension   . Carotid bruit   . Diverticulosis   . Arthritis   . GERD (gastroesophageal reflux  disease)   . Thyroid disease 02-27-12    lt. thyroid nodule, bx. done 5 yrs ago-now some enlargement is seen.  . Injury of left leg 06/05/12    Pt. fell  . Peripheral vascular disease   . Back pain     had cortisone injection 6/15, Dr Durward Fortes  . Carotid artery occlusion     Past Surgical History  Procedure Laterality Date  . Colonoscopy    . Carotid endarterectomy  1998    Stable since surgery -slight build up returning-follows with yrly Dopplers  . Abdominal hysterectomy  01/1980  . Eye surgery  02-27-12    Bil. Cataract surgery  . Thyroid lobectomy  03/02/2012    Procedure: THYROID LOBECTOMY;  Surgeon: Earnstine Regal, MD;  Location: [facility];  Service: General;  Laterality: Left;  . Cataract extraction, bilateral  2004 or 2005 per pt     Current Outpatient Prescriptions  Medication Sig Dispense Refill  . acetaminophen (TYLENOL) 500 MG tablet Take 1,000 mg by mouth 2 (two) times daily. For arthritis pain    . amLODipine (NORVASC) 2.5 MG tablet TAKE 1 TABLET BY MOUTH DAILY WITH BREAKFAST 90 tablet 1  . aspirin 81 MG EC tablet Take 162 mg by mouth daily with breakfast. Taking 2 daily    . B Complex Vitamins (VITAMIN-B COMPLEX PO) Take by mouth daily with breakfast.     . CALCIUM PO Take by mouth daily with breakfast. 600mg     .  estradiol (ESTRACE) 0.5 MG tablet Take 1 tablet (0.5 mg total) by mouth daily. Take half a tablet PO daily (Patient taking differently: Take 0.25 mg by mouth daily. Take half a tablet PO daily) 45 tablet 4  . furosemide (LASIX) 40 MG tablet Take 40 mg by mouth as needed. For edema    . hydrochlorothiazide (HYDRODIURIL) 25 MG tablet TAKE 1 TABLET BY MOUTH DAILY WITH BREAKFAST 30 tablet 3  . KRILL OIL PO Take 500 mg by mouth daily. MEGA RED    . levothyroxine (SYNTHROID, LEVOTHROID) 112 MCG tablet Take 1 tablet (112 mcg total) by mouth daily. 90 tablet 4  . Loratadine (CLARITIN) 10 MG CAPS Take 10 mg by mouth as needed. As needed    . metoprolol succinate  (TOPROL-XL) 100 MG 24 hr tablet TAKE 1 TABLET BY MOUTH DAILY WITH BREAKFAST 30 tablet 11  . Multiple Vitamin (MULTIVITAMIN) tablet Take 1 tablet by mouth daily.      . ranitidine (ZANTAC) 150 MG tablet Take 150 mg by mouth as needed for heartburn.    . rosuvastatin (CRESTOR) 20 MG tablet Take 1 tablet (20 mg total) by mouth daily with breakfast. 90 tablet 3  . telmisartan (MICARDIS) 80 MG tablet TAKE 1 TABLET BY MOUTH DAILY WITH BREAKFAST 30 tablet 1  . verapamil (CALAN-SR) 240 MG CR tablet TAKE 1 TABLET BY MOUTH DAILY WITH BREAKFAST 30 tablet 11   No current facility-administered medications for this visit.    Allergies:   Zebeta and Ace inhibitors    Social History:  The patient  reports that she quit smoking about 39 years ago. She has never used smokeless tobacco. She reports that she drinks alcohol. She reports that she does not use illicit drugs.   Family History:  The patient's family history includes Arthritis in her mother; Heart disease in her mother; Hypertension in her mother; Stroke in her mother. There is no history of Heart attack.    ROS:  Please see the history of present illness.   Otherwise, review of systems are positive for none.   All other systems are reviewed and negative.    PHYSICAL EXAM: VS:  BP 120/60 mmHg  Pulse 58  Ht 5' 3.5" (1.613 m)  Wt 180 lb (81.647 kg)  BMI 31.38 kg/m2  SpO2 98%  LMP 09/24/1979 , BMI Body mass index is 31.38 kg/(m^2). GEN: Well nourished, well developed, in no acute distress HEENT: normal Neck: no JVD, there is a faint musical right carotid bruit.   Cardiac: RRR; no murmurs, rubs, or gallops,no edema  Respiratory:  clear to auscultation bilaterally, normal work of breathing GI: soft, nontender, nondistended, + BS MS: no deformity or atrophy Skin: warm and dry, no rash Neuro:  Strength and sensation are intact Psych: euthymic mood, full affect   EKG:  EKG is not ordered today.   Recent Labs: 04/28/2014: TSH  3.462 03/31/2015: ALT 24; BUN 21; Creatinine, Ser 1.01; Potassium 4.4; Sodium 139    Lipid Panel    Component Value Date/Time   CHOL 146 03/31/2015 1032   TRIG 176.0* 03/31/2015 1032   HDL 31.20* 03/31/2015 1032   CHOLHDL 5 03/31/2015 1032   VLDL 35.2 03/31/2015 1032   LDLCALC 80 03/31/2015 1032   LDLDIRECT 73.4 01/07/2013 0939      Wt Readings from Last 3 Encounters:  04/03/15 180 lb (81.647 kg)  02/08/15 181 lb (82.101 kg)  11/11/14 182 lb 12.8 oz (82.918 kg)        ASSESSMENT  AND PLAN:  1. Essential hypertension without heart failure 2. Hypercholesterolemia 3. asymptomatic right carotid bruit followed by vascular surgeons 4. Status post partial thyroidectomy. TSH levels are followed by her surgeon   Current medicines are reviewed at length with the patient today.  The patient does not have concerns regarding medicines.  The following changes have been made:  no change  Labs/ tests ordered today include:  No orders of the defined types were placed in this encounter.     Continue current medication.  Recheck in 4 months for office visit lipid panel hepatic function panel and basal metabolic panel.  Try to get more exercise.  Berna Spare MD 04/03/2015 5:05 PM    [facility] [place], [place], [place]  [address] Phone: [phone]; Fax: [phone]

## 2015-04-03 NOTE — Patient Instructions (Signed)
Medication Instructions:  Your physician recommends that you continue on your current medications as directed. Please refer to the Current Medication list given to you today.  Labwork: NONE  Testing/Procedures: NONE  Follow-Up: Your physician wants you to follow-up in: 4 months with fasting labs (lp/bmet/hfp)  You will receive a reminder letter in the mail two months in advance. If you don't receive a letter, please call our office to schedule the follow-up appointment.     

## 2015-04-05 ENCOUNTER — Other Ambulatory Visit: Payer: Self-pay | Admitting: Cardiology

## 2015-05-19 ENCOUNTER — Encounter: Payer: Self-pay | Admitting: Obstetrics & Gynecology

## 2015-05-19 ENCOUNTER — Ambulatory Visit (INDEPENDENT_AMBULATORY_CARE_PROVIDER_SITE_OTHER): Payer: Medicare Other | Admitting: Obstetrics & Gynecology

## 2015-05-19 VITALS — BP 110/60 | HR 64 | Resp 16 | Ht 62.25 in | Wt 179.0 lb

## 2015-05-19 DIAGNOSIS — Z01419 Encounter for gynecological examination (general) (routine) without abnormal findings: Secondary | ICD-10-CM | POA: Diagnosis not present

## 2015-05-19 DIAGNOSIS — Z79899 Other long term (current) drug therapy: Secondary | ICD-10-CM

## 2015-05-19 DIAGNOSIS — Z0000 Encounter for general adult medical examination without abnormal findings: Secondary | ICD-10-CM | POA: Diagnosis not present

## 2015-05-19 DIAGNOSIS — E038 Other specified hypothyroidism: Secondary | ICD-10-CM | POA: Diagnosis not present

## 2015-05-19 LAB — POCT URINALYSIS DIPSTICK
Bilirubin, UA: NEGATIVE
Blood, UA: NEGATIVE
Glucose, UA: NEGATIVE
Ketones, UA: NEGATIVE
NITRITE UA: NEGATIVE
PROTEIN UA: NEGATIVE
UROBILINOGEN UA: NEGATIVE
pH, UA: 5

## 2015-05-19 MED ORDER — LEVOTHYROXINE SODIUM 112 MCG PO TABS: 112.0000 ug | ORAL_TABLET | Freq: Every day | ORAL | Status: DC

## 2015-05-19 NOTE — Addendum Note (Signed)
Addended by: Megan Salon on: 05/19/2015 02:00 PM   Modules accepted: SmartSet

## 2015-05-19 NOTE — Progress Notes (Signed)
74 y.o. G2P2 MarriedCaucasianF here for annual exam.  Doing well.  No bowel issues.  No vaginal bleeding.  D/W pt recent information suggesting increased risked of ovarian cancer related to unopposed estrogen.  She is on such a low dosage, I think she should stop this now.   Pt has friend who recently died of ovarian cancer.  Would like to have ca-125 drawn.  Pt and I discussed PUS.     Caregiving husband due to metastatic bone cancer from prostate cancer.  Has been dealing with this for 5 1/2 years.    Patient's last menstrual period was 09/24/1979.          Sexually active: No.  The current method of family planning is status post hysterectomy.    Exercising: No.  The patient does not participate in regular exercise at present. Smoker:  no  Health Maintenance: Pap:  11/2010 Normal  History of abnormal Pap:  no MMG:  02/06/15 BIRADS1:neg Colonoscopy:  05/2013 Diverticulosis. No f/u needed.  Dr. Cristina Gong. BMD:   02/05/13.  -1.5 TDaP:  2012 Screening Labs: PCP, Hb today: PCP, Urine today:  WBC=trace   reports that she quit smoking about 39 years ago. She has never used smokeless tobacco. She reports that she drinks alcohol. She reports that she does not use illicit drugs.  Past Medical History  Diagnosis Date  . Hyperlipidemia   . Hypertension   . Carotid bruit   . Diverticulosis   . Arthritis   . GERD (gastroesophageal reflux disease)   . Thyroid disease 02-27-12    lt. thyroid nodule, bx. done 5 yrs ago-now some enlargement is seen.  . Injury of left leg 06/05/12    Pt. fell  . Peripheral vascular disease   . Back pain     had cortisone injection 6/15, Dr Durward Fortes  . Carotid artery occlusion     Past Surgical History  Procedure Laterality Date  . Colonoscopy    . Carotid endarterectomy  1998    Stable since surgery -slight build up returning-follows with yrly Dopplers  . Abdominal hysterectomy  01/1980  . Eye surgery  02-27-12    Bil. Cataract surgery  . Thyroid lobectomy   03/02/2012    Procedure: THYROID LOBECTOMY;  Surgeon: Earnstine Regal, MD;  Location: [facility];  Service: General;  Laterality: Left;  . Cataract extraction, bilateral  2004 or 2005 per pt    Current Outpatient Prescriptions  Medication Sig Dispense Refill  . acetaminophen (TYLENOL) 500 MG tablet Take 1,000 mg by mouth 2 (two) times daily. For arthritis pain    . amLODipine (NORVASC) 2.5 MG tablet TAKE 1 TABLET BY MOUTH DAILY WITH BREAKFAST 90 tablet 1  . aspirin 81 MG EC tablet Take 162 mg by mouth daily with breakfast. Taking 2 daily    . B Complex Vitamins (VITAMIN-B COMPLEX PO) Take by mouth daily with breakfast.     . CALCIUM PO Take by mouth daily with breakfast. 600mg     . estradiol (ESTRACE) 0.5 MG tablet Take 1 tablet (0.5 mg total) by mouth daily. Take half a tablet PO daily (Patient taking differently: Take 0.25 mg by mouth daily. Take half a tablet PO daily) 45 tablet 4  . hydrochlorothiazide (HYDRODIURIL) 25 MG tablet TAKE 1 TABLET BY MOUTH DAILY WITH BREAKFAST 30 tablet 3  . KRILL OIL PO Take 500 mg by mouth daily. MEGA RED    . levothyroxine (SYNTHROID, LEVOTHROID) 112 MCG tablet Take 1 tablet (112 mcg total) by  mouth daily. 90 tablet 4  . metoprolol succinate (TOPROL-XL) 100 MG 24 hr tablet TAKE 1 TABLET BY MOUTH DAILY WITH BREAKFAST 30 tablet 11  . Multiple Vitamin (MULTIVITAMIN) tablet Take 1 tablet by mouth daily.      . ranitidine (ZANTAC) 150 MG tablet Take 150 mg by mouth as needed for heartburn.    . rosuvastatin (CRESTOR) 20 MG tablet Take 1 tablet (20 mg total) by mouth daily with breakfast. 90 tablet 3  . telmisartan (MICARDIS) 80 MG tablet TAKE 1 TABLET BY MOUTH DAILY WITH BREAKFAST 30 tablet 3  . verapamil (CALAN-SR) 240 MG CR tablet TAKE 1 TABLET BY MOUTH DAILY WITH BREAKFAST 30 tablet 11  . furosemide (LASIX) 40 MG tablet Take 40 mg by mouth as needed. For edema    . Loratadine (CLARITIN) 10 MG CAPS Take 10 mg by mouth as needed. As needed     No current  facility-administered medications for this visit.    Family History  Problem Relation Age of Onset  . Heart disease Mother   . Hypertension Mother   . Arthritis Mother   . Heart attack Neg Hx   . Stroke Mother     ROS:  Pertinent items are noted in HPI.  Otherwise, a comprehensive ROS was negative.  Exam:   BP 110/60 mmHg  Pulse 64  Resp 16  Ht 5' 2.25" (1.581 m)  Wt 179 lb (81.194 kg)  BMI 32.48 kg/m2  LMP 09/24/1979  Weight change: -#10  Height: 5' 2.25" (158.1 cm)  Ht Readings from Last 3 Encounters:  05/19/15 5' 2.25" (1.581 m)  04/03/15 5' 3.5" (1.613 m)  02/08/15 5' 3.5" (1.613 m)    General appearance: alert, cooperative and appears stated age Head: Normocephalic, without obvious abnormality, atraumatic Neck: no adenopathy, supple, symmetrical, trachea midline and thyroid normal to inspection and palpation Lungs: clear to auscultation bilaterally Breasts: normal appearance, no masses or tenderness Heart: regular rate and rhythm Abdomen: soft, non-tender; bowel sounds normal; no masses,  no organomegaly Extremities: extremities normal, atraumatic, no cyanosis or edema Skin: Skin color, texture, turgor normal. No rashes or lesions Lymph nodes: Cervical, supraclavicular, and axillary nodes normal. No abnormal inguinal nodes palpated Neurologic: Grossly normal   Pelvic: External genitalia:  no lesions              Urethra:  normal appearing urethra with no masses, tenderness or lesions              Bartholins and Skenes: normal                 Vagina: normal appearing vagina with normal color and discharge, no lesions              Cervix: absent              Pap taken: No. Bimanual Exam:  Uterus:  uterus absent              Adnexa: normal adnexa and no mass, fullness, tenderness               Rectovaginal: Confirms               Anus:  normal sphincter tone, no lesions  Chaperone was present for exam.  A:  Well Woman with normal exam  PMP, on low dose HRT   H/O TVH, ovaries remain  Diverticulosis, chronic LLQ pain. (H/O soft tissue mass noted on U/S 2008. Dr. Cristina Gong feels this is diverticulosis related. Neg  Ca-125). 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  Last pap smear 3/12. D/w pt guidelines.  We decided together not to do a Pap this year. TSH today. If normal, will need to continue 140mcg qday. #90/4RF  Pt will wean off the estradiol this year.  Pt not given another rx.  She will stop. Pt will return for PUS. Ca 125 today. return annually or prn

## 2015-05-20 LAB — TSH: TSH: 1.724 u[IU]/mL (ref 0.350–4.500)

## 2015-05-20 LAB — CA 125: CA 125: 10 U/mL (ref <35)

## 2015-05-22 ENCOUNTER — Other Ambulatory Visit: Payer: Self-pay | Admitting: Obstetrics & Gynecology

## 2015-05-30 ENCOUNTER — Other Ambulatory Visit: Payer: Self-pay | Admitting: Cardiology

## 2015-05-31 ENCOUNTER — Telehealth: Payer: Self-pay | Admitting: Obstetrics & Gynecology

## 2015-05-31 NOTE — Telephone Encounter (Signed)
Patient returned call. Becky on phone with insurance so Seth Bake took call and reviewed benefits with patient. Patient understood and agreeable. Ok to close.

## 2015-05-31 NOTE — Telephone Encounter (Signed)
Called patient to review benefits for procedure. Left message with Zuleica Seith to call back and review.

## 2015-06-01 ENCOUNTER — Ambulatory Visit (INDEPENDENT_AMBULATORY_CARE_PROVIDER_SITE_OTHER): Payer: Medicare Other | Admitting: Obstetrics & Gynecology

## 2015-06-01 ENCOUNTER — Ambulatory Visit (INDEPENDENT_AMBULATORY_CARE_PROVIDER_SITE_OTHER): Payer: Medicare Other

## 2015-06-01 ENCOUNTER — Encounter: Payer: Self-pay | Admitting: Obstetrics & Gynecology

## 2015-06-01 VITALS — BP 136/60 | HR 64 | Resp 20 | Wt 179.0 lb

## 2015-06-01 DIAGNOSIS — G8929 Other chronic pain: Secondary | ICD-10-CM

## 2015-06-01 DIAGNOSIS — Z79899 Other long term (current) drug therapy: Secondary | ICD-10-CM | POA: Diagnosis not present

## 2015-06-01 DIAGNOSIS — R1031 Right lower quadrant pain: Secondary | ICD-10-CM

## 2015-06-01 DIAGNOSIS — R1032 Left lower quadrant pain: Principal | ICD-10-CM

## 2015-06-01 NOTE — Progress Notes (Signed)
74 y.o. G2P2Marriedfemale here for a pelvic ultrasound due to long term estrogen only usage.  H/o chronic LLQ pain, as well, but to diverticulosis/diverticulitis.  H/o normal colonoscopy 2014.  Sees Dr. Cristina Gong.  Patient's last menstrual period was 09/24/1979.  Sexually active:  no  Contraception: hysterectomy  FINDINGS: UTERUS: surgically absent EMS:  n/a ADNEXA:   Left ovary 1.3 x 0.8 x 0.7cm   Right ovary 2.3 x 1.0 x 1.2cm CUL DE SAC: no free fluid Examination is difficulty due to excess bowel.  No evidence of cystic lesions or masses.  Findings discussed with pt.  She is reassured.  She is stopping her HRT as recommended at [facility] and call with any problems/concerns.  Assessment:  Chronic LLQ pain Normal ultrasound today  Plan: Return for [facility] 1 year.

## 2015-07-03 ENCOUNTER — Other Ambulatory Visit: Payer: Self-pay | Admitting: Cardiology

## 2015-07-29 ENCOUNTER — Other Ambulatory Visit: Payer: Self-pay | Admitting: Cardiology

## 2015-08-04 ENCOUNTER — Other Ambulatory Visit: Payer: Medicare Other

## 2015-08-07 ENCOUNTER — Ambulatory Visit: Payer: Medicare Other | Admitting: Cardiology

## 2015-09-04 ENCOUNTER — Other Ambulatory Visit: Payer: Self-pay | Admitting: Cardiology

## 2015-09-05 ENCOUNTER — Other Ambulatory Visit: Payer: Self-pay | Admitting: Cardiology

## 2015-09-05 ENCOUNTER — Other Ambulatory Visit (INDEPENDENT_AMBULATORY_CARE_PROVIDER_SITE_OTHER): Payer: Medicare Other | Admitting: *Deleted

## 2015-09-05 DIAGNOSIS — I119 Hypertensive heart disease without heart failure: Secondary | ICD-10-CM

## 2015-09-05 DIAGNOSIS — E7800 Pure hypercholesterolemia, unspecified: Secondary | ICD-10-CM

## 2015-09-05 LAB — HEPATIC FUNCTION PANEL
ALK PHOS: 58 U/L (ref 33–130)
ALT: 24 U/L (ref 6–29)
AST: 24 U/L (ref 10–35)
Albumin: 4.3 g/dL (ref 3.6–5.1)
BILIRUBIN INDIRECT: 0.6 mg/dL (ref 0.2–1.2)
BILIRUBIN TOTAL: 0.7 mg/dL (ref 0.2–1.2)
Bilirubin, Direct: 0.1 mg/dL (ref <=0.2)
Total Protein: 7.2 g/dL (ref 6.1–8.1)

## 2015-09-05 LAB — BASIC METABOLIC PANEL
BUN: 19 mg/dL (ref 7–25)
CHLORIDE: 101 mmol/L (ref 98–110)
CO2: 25 mmol/L (ref 20–31)
CREATININE: 0.97 mg/dL — AB (ref 0.60–0.93)
Calcium: 9.3 mg/dL (ref 8.6–10.4)
Glucose, Bld: 84 mg/dL (ref 65–99)
Potassium: 4.3 mmol/L (ref 3.5–5.3)
Sodium: 136 mmol/L (ref 135–146)

## 2015-09-06 LAB — LIPID PANEL
CHOLESTEROL: 133 mg/dL (ref 125–200)
HDL: 37 mg/dL — AB (ref >=46)
LDL Cholesterol: 57 mg/dL (ref <130)
TRIGLYCERIDES: 197 mg/dL — AB (ref <150)
Total CHOL/HDL Ratio: 3.6 Ratio (ref <=5.0)
VLDL: 39 mg/dL — AB (ref <30)

## 2015-09-06 NOTE — Progress Notes (Signed)
Quick Note:  Please make copy of labs for patient visit. ______ 

## 2015-09-07 ENCOUNTER — Encounter: Payer: Self-pay | Admitting: Cardiology

## 2015-09-07 ENCOUNTER — Ambulatory Visit (INDEPENDENT_AMBULATORY_CARE_PROVIDER_SITE_OTHER): Payer: Medicare Other | Admitting: Cardiology

## 2015-09-07 VITALS — BP 130/56 | HR 68 | Ht 64.0 in | Wt 177.4 lb

## 2015-09-07 DIAGNOSIS — I119 Hypertensive heart disease without heart failure: Secondary | ICD-10-CM | POA: Diagnosis not present

## 2015-09-07 DIAGNOSIS — I6523 Occlusion and stenosis of bilateral carotid arteries: Secondary | ICD-10-CM | POA: Diagnosis not present

## 2015-09-07 DIAGNOSIS — E7800 Pure hypercholesterolemia, unspecified: Secondary | ICD-10-CM

## 2015-09-07 NOTE — Progress Notes (Signed)
Quick Note:  Please make copy of labs for patient visit. ______ 

## 2015-09-07 NOTE — Progress Notes (Signed)
Cardiology Office Note   Date:  09/07/2015   ID:  [name], DOB 04/07/1941, MRN [id number]  PCP:  Warren Danes, MD  Cardiologist: Darlin Coco MD  Chief Complaint  Patient presents with  . benign hypertensive disease without heart failure      History of Present Illness: [name] is a 74 y.o. female who presents for scheduled four-month follow-up office visit.  This pleasant 74 year old woman is seen for a scheduled four-month followup office visit. She has a past history of dyslipidemia. She has a history of known residual right carotid bruit after remote right carotid endarterectomy. She's had a recent carotid Doppler which showed evidence of plaque reduction and excellent flow. . She does not have any history of ischemic heart disease. She had a normal nuclear stress test in 2005. She has had essential hypertension and exogenous obesity. She is euthyroid on levothyroxine. 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 She is under more stress. Home health has been coming in. She also has home instead coming in.  [name] has been feeling well with no new cardiac symptoms. No TIA symptoms. No chest pain or shortness of breath. She does not get much intentional exercise but stays busy as the caregiver for her husband. She does have a treadmill out in the basement that she intends to start using.  Her weight is down 2 pounds since last visit.  Blood pressure is remaining stable on current therapy.  Past Medical History  Diagnosis Date  . Hyperlipidemia   . Hypertension   . Carotid bruit   . Diverticulosis   . Arthritis   . GERD (gastroesophageal reflux disease)   . Thyroid disease 02-27-12    lt. thyroid nodule, bx. done 5 yrs ago-now some enlargement is seen.  . Injury of left leg 06/05/12    Pt. fell  . Peripheral vascular disease ([place])   . Back pain     had cortisone injection 6/15,  Dr Durward Fortes  . Carotid artery occlusion     Past Surgical History  Procedure Laterality Date  . Colonoscopy    . Carotid endarterectomy  1998    Stable since surgery -slight build up returning-follows with yrly Dopplers  . Abdominal hysterectomy  01/1980  . Eye surgery  02-27-12    Bil. Cataract surgery  . Thyroid lobectomy  03/02/2012    Procedure: THYROID LOBECTOMY;  Surgeon: Earnstine Regal, MD;  Location: [facility];  Service: General;  Laterality: Left;  . Cataract extraction, bilateral  2004 or 2005 per pt     Current Outpatient Prescriptions  Medication Sig Dispense Refill  . acetaminophen (TYLENOL) 500 MG tablet Take 1,000 mg by mouth 2 (two) times daily. For arthritis pain    . amLODipine (NORVASC) 2.5 MG tablet TAKE 1 TABLET BY MOUTH DAILY WITH BREAKFAST 90 tablet 1  . aspirin 81 MG EC tablet Take 162 mg by mouth daily with breakfast. Taking 2 daily    . B Complex Vitamins (VITAMIN-B COMPLEX PO) Take 1 tablet by mouth daily with breakfast.     . CALCIUM PO Take 600 mg by mouth 3 (three) times a week. 600mg     . furosemide (LASIX) 40 MG tablet Take 40 mg by mouth as needed. For edema    . hydrochlorothiazide (HYDRODIURIL) 25 MG tablet TAKE 1 TABLET BY MOUTH ONCE DAILY WITH BREAKFAST 30 tablet 3  . KRILL OIL PO  Take 500 mg by mouth daily. MEGA RED    . levothyroxine (SYNTHROID, LEVOTHROID) 112 MCG tablet Take 1 tablet (112 mcg total) by mouth daily. 90 tablet 4  . Loratadine (CLARITIN) 10 MG CAPS Take 10 mg by mouth as needed (allergies). As needed    . metoprolol succinate (TOPROL-XL) 100 MG 24 hr tablet TAKE 1 TABLET BY MOUTH DAILY WITH BREAKFAST 30 tablet 2  . Multiple Vitamin (MULTIVITAMIN) tablet Take 1 tablet by mouth daily.      . ranitidine (ZANTAC) 150 MG tablet Take 150 mg by mouth as needed for heartburn.    . rosuvastatin (CRESTOR) 20 MG tablet TAKE 1 TABLET BY MOUTH DAILY WITH BREAKFAST 90 tablet 3  . telmisartan (MICARDIS) 80 MG tablet TAKE 1 TABLET BY MOUTH DAILY WITH  BREAKFAST 30 tablet 3  . verapamil (CALAN-SR) 240 MG CR tablet TAKE 1 TABLET BY MOUTH DAILY WITH BREAKFAST 30 tablet 3   No current facility-administered medications for this visit.    Allergies:   Zebeta and Ace inhibitors    Social History:  The patient  reports that she quit smoking about 39 years ago. She has never used smokeless tobacco. She reports that she drinks alcohol. She reports that she does not use illicit drugs.   Family History:  The patient's family history includes Arthritis in her mother; Heart disease in her mother; Hypertension in her mother; Stroke in her mother. There is no history of Heart attack.    ROS:  Please see the history of present illness.   Otherwise, review of systems are positive for .   All other systems are reviewed and negative.    PHYSICAL EXAM: VS:  BP 130/56 mmHg  Pulse 68  Ht 5\' 4"  (1.626 m)  Wt 177 lb 6.4 oz (80.468 kg)  BMI 30.44 kg/m2  LMP 09/24/1979 , BMI Body mass index is 30.44 kg/(m^2). GEN: Well nourished, well developed, in no acute distress HEENT: normal Neck: There is a right carotid bruit followed annually by vascular surgery Cardiac: RRR; no murmurs, rubs, or gallops,no edema  Respiratory:  clear to auscultation bilaterally, normal work of breathing GI: soft, nontender, nondistended, + BS MS: no deformity or atrophy Skin: warm and dry, no rash Neuro:  Strength and sensation are intact Psych: euthymic mood, full affect   EKG:  EKG is not ordered today.    Recent Labs: 05/19/2015: TSH 1.724 09/05/2015: ALT 24; BUN 19; Creat 0.97*; Potassium 4.3; Sodium 136    Lipid Panel    Component Value Date/Time   CHOL 133 09/05/2015 1054   TRIG 197* 09/05/2015 1054   HDL 37* 09/05/2015 1054   CHOLHDL 3.6 09/05/2015 1054   VLDL 39* 09/05/2015 1054   LDLCALC 57 09/05/2015 1054   LDLDIRECT 73.4 01/07/2013 0939      Wt Readings from Last 3 Encounters:  09/07/15 177 lb 6.4 oz (80.468 kg)  06/01/15 179 lb (81.194 kg)    05/19/15 179 lb (81.194 kg)         ASSESSMENT AND PLAN:  1. Essential hypertension without heart failure 2. Hypercholesterolemia 3. asymptomatic right carotid bruit followed by vascular surgeons 4. Status post partial thyroidectomy. TSH levels are followed by her surgeon   Current medicines are reviewed at length with the patient today.  The patient does not have concerns regarding medicines.  The following changes have been made:  no change  Labs/ tests ordered today include:  No orders of the defined types were placed in this encounter.  We reviewed her recent labs which are satisfactory.  She will continue current medication.  Following my retirement she would like to be followed by Dr. Irish Lack who sees one of her friends.  Return in 4 months for office visit and fasting lab work  Signed, Darlin Coco MD 09/07/2015 11:[address], [place], [place]  [address] Phone: [phone]; Fax: [phone]

## 2015-09-07 NOTE — Patient Instructions (Signed)
Medication Instructions:  Your physician recommends that you continue on your current medications as directed. Please refer to the Current Medication list given to you today.  Labwork: none  Testing/Procedures: none  Follow-Up: Your physician wants you to follow-up in: 4 months with fasting labs (lp/bmet/hfp) with Dr Glennon Hamilton will receive a reminder letter in the mail two months in advance. If you don't receive a letter, please call our office to schedule the follow-up appointment.   If you need a refill on your cardiac medications before your next appointment, please call your pharmacy.

## 2015-09-24 DIAGNOSIS — B029 Zoster without complications: Secondary | ICD-10-CM

## 2015-09-24 HISTORY — DX: Zoster without complications: B02.9

## 2015-10-05 ENCOUNTER — Other Ambulatory Visit: Payer: Self-pay | Admitting: Cardiology

## 2015-12-01 ENCOUNTER — Other Ambulatory Visit: Payer: Self-pay | Admitting: *Deleted

## 2015-12-01 ENCOUNTER — Other Ambulatory Visit: Payer: Self-pay | Admitting: Cardiology

## 2015-12-01 MED ORDER — VERAPAMIL HCL ER 240 MG PO TBCR: 240.0000 mg | EXTENDED_RELEASE_TABLET | Freq: Every day | ORAL | Status: DC

## 2016-01-01 ENCOUNTER — Other Ambulatory Visit: Payer: Self-pay

## 2016-01-01 DIAGNOSIS — Z1231 Encounter for screening mammogram for malignant neoplasm of breast: Secondary | ICD-10-CM

## 2016-01-05 ENCOUNTER — Other Ambulatory Visit: Payer: Self-pay | Admitting: Interventional Cardiology

## 2016-01-16 ENCOUNTER — Encounter: Payer: Medicare Other | Admitting: Interventional Cardiology

## 2016-02-07 ENCOUNTER — Encounter: Payer: Self-pay | Admitting: Vascular Surgery

## 2016-02-09 ENCOUNTER — Ambulatory Visit
Admission: RE | Admit: 2016-02-09 | Discharge: 2016-02-09 | Disposition: A | Discharge status: Home / Self Care | Payer: Medicare Other | Source: Ambulatory Visit

## 2016-02-09 DIAGNOSIS — Z1231 Encounter for screening mammogram for malignant neoplasm of breast: Secondary | ICD-10-CM

## 2016-02-12 ENCOUNTER — Encounter: Payer: Self-pay | Admitting: Vascular Surgery

## 2016-02-12 NOTE — Progress Notes (Signed)
Patient ID: [name], female   DOB: 04/06/1941, 75 y.o.   MRN: [id number]     Cardiology Office Note   Date:  02/13/2016   ID:  [name], DOB 1941-03-22, MRN [id number]  PCP:  Warren Danes, MD    No chief complaint on file. f/u PVD   Wt Readings from Last 3 Encounters:  02/13/16 173 lb 6.4 oz (78.654 kg)  09/07/15 177 lb 6.4 oz (80.468 kg)  06/01/15 179 lb (81.194 kg)       History of Present Illness: [name] is a 75 y.o. female  has a past history of dyslipidemia. She has a history of known residual right carotid bruit after remote right carotid endarterectomy. She's had a recent carotid Doppler which showed evidence of plaque reduction and excellent flow. . She does not have any history of ischemic heart disease. She had a normal nuclear stress test in 2005. She has had essential hypertension and exogenous obesity. She is euthyroid on levothyroxine. 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 She is under more stress. Home health has been coming in. She also has home instead coming in.  Mrs. [name] has been feeling well with no new cardiac symptoms. No TIA symptoms. No chest pain or shortness of breath. She does not get much intentional exercise but stays busy as the caregiver for her husband. She does have a treadmill out in the basement that she intends to start using.  She knows that she has to exercise to do something for herself. She realizes this will be good for her physically and mentally.  Her thyroid disease is followed by her GYN physician. She follows with vascular surgery regarding her carotid disease.    Past Medical History  Diagnosis Date  . Hyperlipidemia   . Hypertension   . Carotid bruit   . Diverticulosis   . Arthritis   . GERD (gastroesophageal reflux disease)   . Thyroid disease 02-27-12    lt. thyroid nodule, bx. done 5 yrs ago-now some enlargement is seen.    . Injury of left leg 06/05/12    Pt. fell  . Peripheral vascular disease ([place])   . Back pain     had cortisone injection 6/15, Dr Durward Fortes  . Carotid artery occlusion     Past Surgical History  Procedure Laterality Date  . Colonoscopy    . Carotid endarterectomy  1998    Stable since surgery -slight build up returning-follows with yrly Dopplers  . Abdominal hysterectomy  01/1980  . Eye surgery  02-27-12    Bil. Cataract surgery  . Thyroid lobectomy  03/02/2012    Procedure: THYROID LOBECTOMY;  Surgeon: Earnstine Regal, MD;  Location: [facility];  Service: General;  Laterality: Left;  . Cataract extraction, bilateral  2004 or 2005 per pt     Current Outpatient Prescriptions  Medication Sig Dispense Refill  . acetaminophen (TYLENOL) 500 MG tablet Take 1,000 mg by mouth 2 (two) times daily. For arthritis pain    . amLODipine (NORVASC) 2.5 MG tablet TAKE 1 TABLET BY MOUTH DAILY WITH BREAKFAST 90 tablet 1  . aspirin 81 MG EC tablet Take 162 mg by mouth daily with breakfast. Taking 2 daily    . B Complex Vitamins (VITAMIN-B COMPLEX PO) Take 1 tablet by mouth daily with breakfast.     . CALCIUM PO Take 600 mg by mouth 3 (three) times a week. 669m    .  furosemide (LASIX) 40 MG tablet Take 40 mg by mouth as needed. For edema    . hydrochlorothiazide (HYDRODIURIL) 25 MG tablet TAKE 1 TABLET BY MOUTH DAILY WITH BREAKFAST 30 tablet 1  . KRILL OIL PO Take 500 mg by mouth daily. MEGA RED    . levothyroxine (SYNTHROID, LEVOTHROID) 112 MCG tablet Take 1 tablet (112 mcg total) by mouth daily. 90 tablet 4  . Loratadine (CLARITIN) 10 MG CAPS Take 10 mg by mouth as needed (allergies). As needed    . metoprolol succinate (TOPROL-XL) 100 MG 24 hr tablet TAKE 1 TABLET BY MOUTH DAILY WITH BREAKFAST 30 tablet 10  . Multiple Vitamin (MULTIVITAMIN) tablet Take 1 tablet by mouth daily.      . ranitidine (ZANTAC) 150 MG tablet Take 150 mg by mouth as needed for heartburn.    . rosuvastatin (CRESTOR) 20 MG tablet  TAKE 1 TABLET BY MOUTH DAILY WITH BREAKFAST 90 tablet 3  . telmisartan (MICARDIS) 80 MG tablet TAKE 1 TABLET BY MOUTH DAILY WITH BREAKFAST 30 tablet 1  . verapamil (CALAN-SR) 240 MG CR tablet Take 1 tablet (240 mg total) by mouth daily with breakfast. 60 tablet 1   No current facility-administered medications for this visit.    Allergies:   Zebeta and Ace inhibitors    Social History:  The patient  reports that she quit smoking about 40 years ago. She has never used smokeless tobacco. She reports that she drinks alcohol. She reports that she does not use illicit drugs.   Family History:  The patient's family history includes Arthritis in her mother; Heart disease in her mother; Hypertension in her mother; Stroke in her mother. There is no history of Heart attack.    ROS:  Please see the history of present illness.   Otherwise, review of systems are positive for lack of exercise.   All other systems are reviewed and negative.    PHYSICAL EXAM: VS:  BP 120/60 mmHg  Pulse 60  Ht _0  (1.626 m)  Wt 173 lb 6.4 oz (78.654 kg)  BMI 29.75 kg/m2  LMP 09/24/1979 , BMI Body mass index is 29.75 kg/(m^2). GEN: Well nourished, well developed, in no acute distress HEENT: normal Neck: no JVD, 2/6 right carotid bruit; no left carotid bruit; no masses Cardiac: RRR; no murmurs, rubs, or gallops,no edema  Respiratory:  clear to auscultation bilaterally, normal work of breathing GI: soft, nontender, nondistended, + BS MS: no deformity or atrophy Skin: warm and dry, no rash Neuro:  Strength and sensation are intact Psych: euthymic mood, full affect   EKG:   The ekg ordered today demonstrates NSR, no ST segment changes   Recent Labs: 05/19/2015: TSH 1.724 09/05/2015: ALT 24; BUN 19; Creat 0.97*; Potassium 4.3; Sodium 136   Lipid Panel    Component Value Date/Time   CHOL 133 09/05/2015 1054   TRIG 197* 09/05/2015 1054   HDL 37* 09/05/2015 1054   CHOLHDL 3.6 09/05/2015 1054   VLDL 39*  09/05/2015 1054   LDLCALC 57 09/05/2015 1054   LDLDIRECT 73.4 01/07/2013 0939     Other studies Reviewed: Additional studies/ records that were reviewed today with results demonstrating: prior carotid DOppler.   ASSESSMENT AND PLAN:  1. Essential hypertension without heart failure: BP well controlled.  COntinue current meds.  2. Hypercholesterolemia: COntrolled in 12/16.  COntinue current lipid lowering therapy. 3. asymptomatic right carotid bruit followed by vascular surgeons: post CEA.  Moderate bilateral disease. I stressed the importance of lifestyle modifications.  She needs to try to get more exercise with a target as noted below. She has a treadmill at the house. Hypothyroid: followed by GYN.   Current medicines are reviewed at length with the patient today.  The patient concerns regarding her medicines were addressed.  The following changes have been made:  No change  Labs/ tests ordered today include:   Orders Placed This Encounter  Procedures  . Comp Met (CMET)  . Lipid Profile    Recommend 150 minutes/week of aerobic exercise Low fat, low carb, high fiber diet recommended  Disposition:   FU in 6 months   Signed, Larae Grooms, MD  02/13/2016 2:04 PM    [facility] [address], [place], [place]  [address] Phone: [phone]; Fax: [phone]

## 2016-02-13 ENCOUNTER — Encounter: Payer: Self-pay | Admitting: Interventional Cardiology

## 2016-02-13 ENCOUNTER — Ambulatory Visit (INDEPENDENT_AMBULATORY_CARE_PROVIDER_SITE_OTHER): Payer: Medicare Other | Admitting: Interventional Cardiology

## 2016-02-13 VITALS — BP 120/60 | HR 60 | Ht 64.0 in | Wt 173.4 lb

## 2016-02-13 DIAGNOSIS — I779 Disorder of arteries and arterioles, unspecified: Secondary | ICD-10-CM | POA: Diagnosis not present

## 2016-02-13 DIAGNOSIS — E785 Hyperlipidemia, unspecified: Secondary | ICD-10-CM

## 2016-02-13 DIAGNOSIS — I119 Hypertensive heart disease without heart failure: Secondary | ICD-10-CM | POA: Diagnosis not present

## 2016-02-13 DIAGNOSIS — I739 Peripheral vascular disease, unspecified: Secondary | ICD-10-CM

## 2016-02-13 LAB — LIPID PANEL
CHOL/HDL RATIO: 3.8 ratio (ref <=5.0)
Cholesterol: 145 mg/dL (ref 125–200)
HDL: 38 mg/dL — AB (ref >=46)
LDL CALC: 67 mg/dL (ref <130)
Triglycerides: 202 mg/dL — ABNORMAL HIGH (ref <150)
VLDL: 40 mg/dL — ABNORMAL HIGH (ref <30)

## 2016-02-13 LAB — COMPREHENSIVE METABOLIC PANEL
ALBUMIN: 4.4 g/dL (ref 3.6–5.1)
ALT: 22 U/L (ref 6–29)
AST: 21 U/L (ref 10–35)
Alkaline Phosphatase: 70 U/L (ref 33–130)
BILIRUBIN TOTAL: 0.6 mg/dL (ref 0.2–1.2)
BUN: 26 mg/dL — AB (ref 7–25)
CHLORIDE: 103 mmol/L (ref 98–110)
CO2: 24 mmol/L (ref 20–31)
CREATININE: 1.06 mg/dL — AB (ref 0.60–0.93)
Calcium: 9.3 mg/dL (ref 8.6–10.4)
Glucose, Bld: 89 mg/dL (ref 65–99)
Potassium: 4.5 mmol/L (ref 3.5–5.3)
SODIUM: 138 mmol/L (ref 135–146)
TOTAL PROTEIN: 7.1 g/dL (ref 6.1–8.1)

## 2016-02-13 NOTE — Patient Instructions (Signed)
**Note De-Identified [name] Obfuscation** Medication Instructions:  Same-no changes  Labwork: Lipids and a CMET today  Testing/Procedures: None  Follow-Up: Your physician wants you to follow-up in: 6 months. You will receive a reminder letter in the mail two months in advance. If you don't receive a letter, please call our office to schedule the follow-up appointment.     If you need a refill on your cardiac medications before your next appointment, please call your pharmacy.

## 2016-02-14 ENCOUNTER — Ambulatory Visit (HOSPITAL_COMMUNITY)
Admission: RE | Admit: 2016-02-14 | Discharge: 2016-02-14 | Disposition: A | Discharge status: Home / Self Care | Payer: Medicare Other | Source: Ambulatory Visit | Attending: Vascular Surgery | Admitting: Vascular Surgery

## 2016-02-14 ENCOUNTER — Ambulatory Visit (INDEPENDENT_AMBULATORY_CARE_PROVIDER_SITE_OTHER): Payer: Medicare Other | Admitting: Vascular Surgery

## 2016-02-14 ENCOUNTER — Encounter: Payer: Self-pay | Admitting: Vascular Surgery

## 2016-02-14 ENCOUNTER — Other Ambulatory Visit: Payer: Self-pay | Admitting: Vascular Surgery

## 2016-02-14 VITALS — BP 117/64 | HR 59 | Ht 64.0 in | Wt 173.0 lb

## 2016-02-14 DIAGNOSIS — K219 Gastro-esophageal reflux disease without esophagitis: Secondary | ICD-10-CM | POA: Diagnosis not present

## 2016-02-14 DIAGNOSIS — I6529 Occlusion and stenosis of unspecified carotid artery: Secondary | ICD-10-CM

## 2016-02-14 DIAGNOSIS — I10 Essential (primary) hypertension: Secondary | ICD-10-CM | POA: Insufficient documentation

## 2016-02-14 DIAGNOSIS — I6523 Occlusion and stenosis of bilateral carotid arteries: Secondary | ICD-10-CM | POA: Insufficient documentation

## 2016-02-14 DIAGNOSIS — E785 Hyperlipidemia, unspecified: Secondary | ICD-10-CM | POA: Insufficient documentation

## 2016-02-14 NOTE — Progress Notes (Signed)
[facility] [place]  Patient name: [name] MRN: [id number] DOB: May 07, 1941 Sex: female  REASON FOR VISIT: 1 year follow up  HPI: [name] is a 75 y.o. female who I last saw in May 2015. I've been following her with bilateral carotid disease. Her most recent duplex scan was a year ago on 02/08/2015 at which time she had a patent right carotid endarterectomy site with a 50-69% recurrent stenosis. On the left side it was a less than 49% stenosis. However there was stenosis in the distal common carotid artery on the right greater than 50%. She was set up for a 1 year follow up visit.  Since I saw her last, she denies any history of stroke, TIAs, expressive or receptive aphasia, or amaurosis fugax.  She is on aspirin and is on a statin.  Past Medical History  Diagnosis Date  . Hyperlipidemia   . Hypertension   . Carotid bruit   . Diverticulosis   . Arthritis   . GERD (gastroesophageal reflux disease)   . Thyroid disease 02-27-12    lt. thyroid nodule, bx. done 5 yrs ago-now some enlargement is seen.  . Injury of left leg 06/05/12    Pt. fell  . Peripheral vascular disease ([place])   . Back pain     had cortisone injection 6/15, Dr Durward Fortes  . Carotid artery occlusion     Family History  Problem Relation Age of Onset  . Heart disease Mother   . Hypertension Mother   . Arthritis Mother   . Stroke Mother   . Heart attack Neg Hx   . Heart disease Brother     before age 62    SOCIAL HISTORY: Social History  Substance Use Topics  . Smoking status: Former Smoker    Quit date: 09/24/1975  . Smokeless tobacco: Never Used  . Alcohol Use: 0.0 oz/week    1-2 Glasses of wine per week     Comment: 1-2 drinks a week    Allergies  Allergen Reactions  . Zebeta Other (See Comments)    Does not work, per patient.  . Ace Inhibitors Other (See Comments)    cough    Current Outpatient Prescriptions  Medication Sig Dispense Refill  . acetaminophen  (TYLENOL) 500 MG tablet Take 1,000 mg by mouth 2 (two) times daily. For arthritis pain    . amLODipine (NORVASC) 2.5 MG tablet TAKE 1 TABLET BY MOUTH DAILY WITH BREAKFAST 90 tablet 1  . aspirin 81 MG EC tablet Take 162 mg by mouth daily with breakfast. Taking 2 daily    . B Complex Vitamins (VITAMIN-B COMPLEX PO) Take 1 tablet by mouth daily with breakfast.     . CALCIUM PO Take 600 mg by mouth 3 (three) times a week. 600mg     . furosemide (LASIX) 40 MG tablet Take 40 mg by mouth as needed. For edema    . hydrochlorothiazide (HYDRODIURIL) 25 MG tablet TAKE 1 TABLET BY MOUTH DAILY WITH BREAKFAST 30 tablet 1  . KRILL OIL PO Take 500 mg by mouth daily. MEGA RED    . levothyroxine (SYNTHROID, LEVOTHROID) 112 MCG tablet Take 1 tablet (112 mcg total) by mouth daily. 90 tablet 4  . Loratadine (CLARITIN) 10 MG CAPS Take 10 mg by mouth as needed (allergies). As needed    . metoprolol succinate (TOPROL-XL) 100 MG 24 hr tablet TAKE 1 TABLET BY MOUTH DAILY WITH BREAKFAST 30 tablet 10  . Multiple Vitamin (MULTIVITAMIN)  tablet Take 1 tablet by mouth daily.      . ranitidine (ZANTAC) 150 MG tablet Take 150 mg by mouth as needed for heartburn.    . rosuvastatin (CRESTOR) 20 MG tablet TAKE 1 TABLET BY MOUTH DAILY WITH BREAKFAST 90 tablet 3  . telmisartan (MICARDIS) 80 MG tablet TAKE 1 TABLET BY MOUTH DAILY WITH BREAKFAST 30 tablet 1  . verapamil (CALAN-SR) 240 MG CR tablet Take 1 tablet (240 mg total) by mouth daily with breakfast. 60 tablet 1   No current facility-administered medications for this visit.    REVIEW OF SYSTEMS:  [X]  denotes positive finding, [ ]  denotes negative finding Cardiac  Comments:  Chest pain or chest pressure:    Shortness of breath upon exertion:    Short of breath when lying flat:    Irregular heart rhythm:        Vascular    Pain in calf, thigh, or hip brought on by ambulation:    Pain in feet at night that wakes you up from your sleep:     Blood clot in your veins:    Leg  swelling:         Pulmonary    Oxygen at home:    Productive cough:     Wheezing:         Neurologic    Sudden weakness in arms or legs:     Sudden numbness in arms or legs:     Sudden onset of difficulty speaking or slurred speech:    Temporary loss of vision in one eye:     Problems with dizziness:         Gastrointestinal    Blood in stool:     Vomited blood:         Genitourinary    Burning when urinating:     Blood in urine:        Psychiatric    Major depression:         Hematologic    Bleeding problems:    Problems with blood clotting too easily:        Skin    Rashes or ulcers:        Constitutional    Fever or chills:      PHYSICAL EXAM: Filed Vitals:   02/14/16 1347 02/14/16 1349  BP: 126/64 117/64  Pulse: 59   Height: 5\' 4"  (1.626 m)   Weight: 173 lb (78.472 kg)   SpO2: 96%     GENERAL: The patient is a well-nourished female, in no acute distress. The vital signs are documented above. CARDIAC: There is a regular rate and rhythm.  VASCULAR: I do not detect carotid bruits. PULMONARY: There is good air exchange bilaterally without wheezing or rales. ABDOMEN: Soft and non-tender with normal pitched bowel sounds.  MUSCULOSKELETAL: There are no major deformities or cyanosis. NEUROLOGIC: No focal weakness or paresthesias are detected. SKIN: There are no ulcers or rashes noted. PSYCHIATRIC: The patient has a normal affect.  DATA:   CAROTID DUPLEX: I have independently interpreted her carotid duplex scan today.  On the right side, the right carotid endarterectomy site is widely patent with a 40-59% recurrent stenosis. The velocities on the right have improved compared to her previous study one year ago.  On the left side, there is a 40-59% carotid stenosis. The distal common carotid artery stenosis on the left is stable. The velocities and actually improved slightly.  MEDICAL ISSUES:  BILATERAL CAROTID DISEASE:  She has a  recurrent 40-59% right  carotid stenosis and also a 40-59% left carotid stenosis. These are asymptomatic. Given that the velocities on both sides have improved slightly, I think it is safe to continue her follow up in 1 year. Aborted a follow carotid duplex scan in 1 year and I'll see her back at that time. She knows to call sooner if she has problems. In the meantime she is to continue her aspirin and Crestor.    Deitra Mayo [facility] [place] [phone]

## 2016-02-14 NOTE — Progress Notes (Signed)
duplicate

## 2016-03-05 ENCOUNTER — Other Ambulatory Visit: Payer: Self-pay | Admitting: Interventional Cardiology

## 2016-03-27 ENCOUNTER — Other Ambulatory Visit: Payer: Self-pay | Admitting: *Deleted

## 2016-03-27 ENCOUNTER — Other Ambulatory Visit: Payer: Self-pay | Admitting: Interventional Cardiology

## 2016-03-27 MED ORDER — AMLODIPINE BESYLATE 2.5 MG PO TABS: 2.5000 mg | ORAL_TABLET | Freq: Every day | ORAL | Status: DC

## 2016-05-02 ENCOUNTER — Other Ambulatory Visit: Payer: Self-pay | Admitting: *Deleted

## 2016-05-02 DIAGNOSIS — I6523 Occlusion and stenosis of bilateral carotid arteries: Secondary | ICD-10-CM

## 2016-05-10 ENCOUNTER — Encounter (HOSPITAL_COMMUNITY): Payer: Self-pay | Admitting: Emergency Medicine

## 2016-05-10 ENCOUNTER — Ambulatory Visit (HOSPITAL_COMMUNITY)
Admission: EM | Admit: 2016-05-10 | Discharge: 2016-05-10 | Disposition: A | Discharge status: Home / Self Care | Payer: Medicare Other | Attending: Internal Medicine | Admitting: Internal Medicine

## 2016-05-10 DIAGNOSIS — R0982 Postnasal drip: Secondary | ICD-10-CM

## 2016-05-10 DIAGNOSIS — J3489 Other specified disorders of nose and nasal sinuses: Secondary | ICD-10-CM | POA: Diagnosis not present

## 2016-05-10 DIAGNOSIS — T700XXA Otitic barotrauma, initial encounter: Secondary | ICD-10-CM

## 2016-05-10 DIAGNOSIS — H6982 Other specified disorders of Eustachian tube, left ear: Secondary | ICD-10-CM | POA: Diagnosis not present

## 2016-05-10 DIAGNOSIS — H6992 Unspecified Eustachian tube disorder, left ear: Secondary | ICD-10-CM

## 2016-05-10 MED ORDER — PREDNISONE 10 MG PO TABS: ORAL_TABLET | ORAL | 0 refills | Status: DC

## 2016-05-10 NOTE — Discharge Instructions (Signed)
Recommend taking Sudafed PE 10 mg every 4-6 hours as needed for congestion. Also, Claritin or other nondrowsy antihistamine to assist with sinus drainage. Copious amounts of saline nasal spray and the steroid nasal spray once or twice a day.

## 2016-05-10 NOTE — ED Triage Notes (Signed)
Pt has been suffering from left temporal, ear, and facial pain for about three days.  She states it started in her left temple 3 days ago with shooting pains.  It then radiated to below her left ear and then to her left cheekbone and neck.  She was concerned for sinus issues or allergies so she has taken tylenol, Advil, nasal spray and Claritin with little relief.  She denies any injury or fever.

## 2016-05-10 NOTE — ED Provider Notes (Signed)
CSN: [id number]     Arrival date & time 05/10/16  1120 History   First MD Initiated Contact with Patient 05/10/16 1141     Chief Complaint  Patient presents with  . Facial Pain   (Consider location/radiation/quality/duration/timing/severity/associated sxs/prior Treatment) 75 year old female states that approximately 4 days ago she developed a left temporal headache type pain described as very mild to a point that sometimes distraction causes her to forget about it. This lasted for approximately one to 2 days before improving. Then she developed some discomfort behind the left jaw just below the earlobe. She later developed pain that seemed to radiate from the jaw to the temple and sometimes to the midline of the vertex of the head. His pain tends to come and go. She states it feels like a referred pain. She denies actual toothache pain there is no pain with chewing motion. Sometimes it is elicited by flexing her neck side to side. She also has chronic intermittent PND and has to clear her throat frequently and causes some voice change. She has taken one Aleve that helped with her discomfort. Denies fever or chills. Denies problems with vision, speech, hearing, swallowing, focal paresthesias or weakness. Denies problems with unusual sleepiness, confusion or orientation or memory.      Past Medical History:  Diagnosis Date  . Arthritis   . Back pain    had cortisone injection 6/15, Dr Durward Fortes  . Carotid artery occlusion   . Carotid bruit   . Diverticulosis   . GERD (gastroesophageal reflux disease)   . Hyperlipidemia   . Hypertension   . Injury of left leg 06/05/12   Pt. fell  . Peripheral vascular disease ([place])   . Thyroid disease 02-27-12   lt. thyroid nodule, bx. done 5 yrs ago-now some enlargement is seen.   Past Surgical History:  Procedure Laterality Date  . ABDOMINAL HYSTERECTOMY  01/1980  . CAROTID ENDARTERECTOMY  1998   Stable since surgery -slight build up returning-follows  with yrly Dopplers  . CATARACT EXTRACTION, BILATERAL  2004 or 2005 per pt  . COLONOSCOPY    . EYE SURGERY  02-27-12   Bil. Cataract surgery  . THYROID LOBECTOMY  03/02/2012   Procedure: THYROID LOBECTOMY;  Surgeon: Earnstine Regal, MD;  Location: [facility];  Service: General;  Laterality: Left;   Family History  Problem Relation Age of Onset  . Heart disease Mother   . Hypertension Mother   . Arthritis Mother   . Stroke Mother   . Heart attack Neg Hx   . Heart disease Brother     before age 19   Social History  Substance Use Topics  . Smoking status: Former Smoker    Quit date: 09/24/1975  . Smokeless tobacco: Never Used  . Alcohol use 0.0 oz/week    1 - 2 Glasses of wine per week     Comment: 1-2 drinks a week   OB History    Gravida Para Term Preterm AB Living   2 2       2    SAB TAB Ectopic Multiple Live Births                 Review of Systems  Constitutional: Negative for activity change, fatigue and fever.  HENT: Positive for congestion, ear pain, postnasal drip and voice change. Negative for ear discharge, facial swelling, hearing loss, sore throat and trouble swallowing.   Eyes: Negative for pain, discharge and visual disturbance.  Respiratory: Negative.  Cardiovascular: Negative.   Gastrointestinal: Negative.   Genitourinary: Negative.   Musculoskeletal: Negative.   Skin: Negative.   Neurological: Positive for headaches. Negative for dizziness, tremors, seizures, syncope, facial asymmetry, speech difficulty and numbness.  Psychiatric/Behavioral: Negative.   All other systems reviewed and are negative.   Allergies  Zebeta and Ace inhibitors  Home Medications   Prior to Admission medications   Medication Sig Start Date End Date Taking? Authorizing Provider  acetaminophen (TYLENOL) 500 MG tablet Take 1,000 mg by mouth 2 (two) times daily. For arthritis pain   Yes Historical Provider, MD  amLODipine (NORVASC) 2.5 MG tablet Take 1 tablet (2.5 mg total) by mouth  daily with breakfast. 03/27/16  Yes Jettie Booze, MD  B Complex Vitamins (VITAMIN-B COMPLEX PO) Take 1 tablet by mouth daily with breakfast.    Yes Historical Provider, MD  hydrochlorothiazide (HYDRODIURIL) 25 MG tablet Take 1 tablet (25 mg total) by mouth daily. 03/06/16  Yes Jettie Booze, MD  levothyroxine (SYNTHROID, LEVOTHROID) 112 MCG tablet Take 1 tablet (112 mcg total) by mouth daily. 05/19/15  Yes Megan Salon, MD  Loratadine (CLARITIN) 10 MG CAPS Take 10 mg by mouth as needed (allergies). As needed   Yes Historical Provider, MD  Multiple Vitamin (MULTIVITAMIN) tablet Take 1 tablet by mouth daily.     Yes Historical Provider, MD  rosuvastatin (CRESTOR) 20 MG tablet TAKE 1 TABLET BY MOUTH DAILY WITH BREAKFAST 07/31/15  Yes Darlin Coco, MD  telmisartan (MICARDIS) 80 MG tablet Take 1 tablet (80 mg total) by mouth daily. 03/06/16  Yes Jettie Booze, MD  verapamil (CALAN-SR) 240 MG CR tablet TAKE 1 TABLET BY MOUTH DAILY WITH BREAKFAST 03/27/16  Yes Jettie Booze, MD  aspirin 81 MG EC tablet Take 162 mg by mouth daily with breakfast. Taking 2 daily    Historical Provider, MD  CALCIUM PO Take 600 mg by mouth 3 (three) times a week. 600mg     Historical Provider, MD  furosemide (LASIX) 40 MG tablet Take 40 mg by mouth as needed. For edema    Historical Provider, MD  KRILL OIL PO Take 500 mg by mouth daily. MEGA RED    Historical Provider, MD  metoprolol succinate (TOPROL-XL) 100 MG 24 hr tablet TAKE 1 TABLET BY MOUTH DAILY WITH BREAKFAST 10/05/15   Darlin Coco, MD  predniSONE (DELTASONE) 10 MG tablet Take 2 tabs daily x 3 d, 1 tab daily for 3 d, then 1/2 tab daily x 2 d. Take with food 05/10/16   Janne Napoleon, NP  ranitidine (ZANTAC) 150 MG tablet Take 150 mg by mouth as needed for heartburn.    Historical Provider, MD   Meds Ordered and Administered this Visit  Medications - No data to display  BP 124/55 (BP Location: Right Arm)   Pulse 67   Temp 97.8 F (36.6 C)  (Oral)   Resp 18   LMP 09/24/1979   SpO2 96%  No data found.   Physical Exam  Constitutional: She is oriented to person, place, and time. She appears well-developed and well-nourished. No distress.  HENT:  Head: Normocephalic and atraumatic.  Oropharynx with minor erythema, much cobblestoning and clear PND.  Right EAC with cerumen. Unable to see TM. Left TM retracted. Normal color. No erythema or effusion.  Mild tenderness to the soft tissue space inferior to the angle of the jaw and just below the year low.  Manual pressure applied over the left maxilla and left medial forehead with tenderness,  tenderness to the left paranasal sinus.  Cardiovascular: Normal rate.   Pulmonary/Chest: Effort normal.  Musculoskeletal: Normal range of motion. She exhibits no edema.  Neurological: She is alert and oriented to person, place, and time.  Skin: Skin is warm and dry.  Psychiatric: She has a normal mood and affect.  Nursing note and vitals reviewed.   Urgent Care Course   Clinical Course    Procedures (including critical care time)  Labs Review Labs Reviewed - No data to display  Imaging Review No results found.   Visual Acuity Review  Right Eye Distance:   Left Eye Distance:   Bilateral Distance:    Right Eye Near:   Left Eye Near:    Bilateral Near:         MDM   1. Sinus pain   2. ETD (eustachian tube dysfunction), left   3. Barotitis media, initial encounter   4. PND (post-nasal drip)    Meds ordered this encounter  Medications  . predniSONE (DELTASONE) 10 MG tablet    Sig: Take 2 tabs daily x 3 d, 1 tab daily for 3 d, then 1/2 tab daily x 2 d. Take with food    Dispense:  10 tablet    Refill:  0    Order Specific Question:   Supervising Provider    Answer:   Sherlene Shams [id number]   Recommend taking Sudafed PE 10 mg every 4-6 hours as needed for congestion. Also, Claritin or other nondrowsy antihistamine to assist with sinus drainage. Copious  amounts of saline nasal spray and the steroid nasal spray once or twice a day.      Janne Napoleon, NP 05/10/16 [phone]

## 2016-06-04 ENCOUNTER — Encounter: Payer: Self-pay | Admitting: Family Medicine

## 2016-06-04 ENCOUNTER — Ambulatory Visit (INDEPENDENT_AMBULATORY_CARE_PROVIDER_SITE_OTHER): Payer: Medicare Other | Admitting: Family Medicine

## 2016-06-04 VITALS — BP 95/56 | HR 62 | Resp 18 | Wt 180.0 lb

## 2016-06-04 DIAGNOSIS — R0989 Other specified symptoms and signs involving the circulatory and respiratory systems: Secondary | ICD-10-CM

## 2016-06-04 DIAGNOSIS — M1990 Unspecified osteoarthritis, unspecified site: Secondary | ICD-10-CM

## 2016-06-04 DIAGNOSIS — J3089 Other allergic rhinitis: Secondary | ICD-10-CM

## 2016-06-04 DIAGNOSIS — M8580 Other specified disorders of bone density and structure, unspecified site: Secondary | ICD-10-CM

## 2016-06-04 DIAGNOSIS — I739 Peripheral vascular disease, unspecified: Secondary | ICD-10-CM

## 2016-06-04 DIAGNOSIS — K219 Gastro-esophageal reflux disease without esophagitis: Secondary | ICD-10-CM

## 2016-06-04 DIAGNOSIS — E890 Postprocedural hypothyroidism: Secondary | ICD-10-CM

## 2016-06-04 DIAGNOSIS — I119 Hypertensive heart disease without heart failure: Secondary | ICD-10-CM

## 2016-06-04 DIAGNOSIS — E785 Hyperlipidemia, unspecified: Secondary | ICD-10-CM

## 2016-06-04 DIAGNOSIS — K5750 Diverticulosis of both small and large intestine without perforation or abscess without bleeding: Secondary | ICD-10-CM | POA: Diagnosis not present

## 2016-06-04 DIAGNOSIS — M899 Disorder of bone, unspecified: Secondary | ICD-10-CM

## 2016-06-04 DIAGNOSIS — I779 Disorder of arteries and arterioles, unspecified: Secondary | ICD-10-CM

## 2016-06-04 NOTE — Assessment & Plan Note (Addendum)
R  CEA-  1998. Performed by Dr. Mare Ferrari at the time per patient.    Patient's last carotid ultrasound in 5/17- showed history of right CEA ( done in 09/11/07) and a 40-60% reocclusion on the right most prominent proximal internal carotid.  Left internal carotid is 40-60% stenosed and increased velocities left distal common carotid\bifurcation region.  She is followed by Dr. Gae Gallop, last seen in May 2017

## 2016-06-04 NOTE — Progress Notes (Signed)
New patient office visit note:  Impression and Recommendations:    1. Environmental and seasonal allergies   2. Gastroesophageal reflux disease, esophagitis presence not specified   3. Arthritis   4. Bilateral carotid artery disease ([place])   5. Right carotid bruit   6. Diverticulosis of both small and large intestine without bleeding   7. Dyslipidemia   8. Benign hypertensive heart disease without heart failure   9. Hypothyroidism, postsurgical   10. Moderate osteopenia     Environmental and seasonal allergies Claritin daily  Encouraged sinus rinses twice a day and use Flonase if necessary in addition   GERD (gastroesophageal reflux disease) Continue Zantac. Dietary and lifestyle modifications discussed    Arthritis Encouraged regular exercise of a walking regimen. Discussed that if she doesn't use it, she will lose it   Bilateral carotid artery disease ([place]) R  CEA-  1998. Performed by Dr. Mare Ferrari at the time per patient.  Patient's last carotid ultrasound in 5/17- showed history of right CEA ( done in 09/11/07) and a 40-60% reocclusion on the right most prominent proximal internal carotid.  Left internal carotid is 40-60% stenosed and increased velocities left distal common carotid\bifurcation region.  She is followed by Dr. Gae Gallop, last seen in May 2017   Dyslipidemia No labs since May- followed/txed by Cards.  - prudent diet/ exercise d/c pt.  No time   Benign hypertensive heart disease without heart failure A little low but patient asymptomatic. Asked to monitor at home.    - Patient currently treated by Dr. Irish Lack of cardiology with HCTZ, Lasix, amlodipine, metoprolol, telmisartan and verapamil   Hypothyroidism, postsurgical Will need blood work in near future.   Right carotid bruit Patient asymptomatic today   Orders Placed This Encounter  Procedures  . CBC with Differential/Platelet  . COMPLETE METABOLIC PANEL WITH GFR  .  Vitamin B12  . VITAMIN D 25 Hydroxy (Vit-D Deficiency, Fractures)  . TSH  . T4, free  . Lipid panel  . Hemoglobin A1c    Patient's Medications  New Prescriptions   No medications on file  Previous Medications   ACETAMINOPHEN (TYLENOL) 500 MG TABLET    Take 1,000 mg by mouth 2 (two) times daily. For arthritis pain   AMLODIPINE (NORVASC) 2.5 MG TABLET    Take 1 tablet (2.5 mg total) by mouth daily with breakfast.   ASPIRIN 81 MG EC TABLET    Take 162 mg by mouth daily with breakfast. Taking 2 daily   B COMPLEX VITAMINS (VITAMIN-B COMPLEX PO)    Take 1 tablet by mouth daily with breakfast.    CALCIUM PO    Take 600 mg by mouth 3 (three) times a week. 600mg    FUROSEMIDE (LASIX) 40 MG TABLET    Take 40 mg by mouth as needed. For edema   HYDROCHLOROTHIAZIDE (HYDRODIURIL) 25 MG TABLET    Take 1 tablet (25 mg total) by mouth daily.   KRILL OIL PO    Take 500 mg by mouth daily. MEGA RED   LEVOTHYROXINE (SYNTHROID, LEVOTHROID) 112 MCG TABLET    Take 1 tablet (112 mcg total) by mouth daily.   LORATADINE (CLARITIN) 10 MG CAPS    Take 10 mg by mouth as needed (allergies). As needed   METOPROLOL SUCCINATE (TOPROL-XL) 100 MG 24 HR TABLET    TAKE 1 TABLET BY MOUTH DAILY WITH BREAKFAST   MULTIPLE VITAMIN (MULTIVITAMIN) TABLET    Take 1 tablet by mouth daily.  RANITIDINE (ZANTAC) 150 MG TABLET    Take 150 mg by mouth as needed for heartburn.   ROSUVASTATIN (CRESTOR) 20 MG TABLET    TAKE 1 TABLET BY MOUTH DAILY WITH BREAKFAST   TELMISARTAN (MICARDIS) 80 MG TABLET    Take 1 tablet (80 mg total) by mouth daily.   VERAPAMIL (CALAN-SR) 240 MG CR TABLET    TAKE 1 TABLET BY MOUTH DAILY WITH BREAKFAST  Modified Medications   No medications on file  Discontinued Medications   PREDNISONE (DELTASONE) 10 MG TABLET    Take 2 tabs daily x 3 d, 1 tab daily for 3 d, then 1/2 tab daily x 2 d. Take with food    Return for Fasting blood work near future and then follow-up office visit with me couple weeks later to  discuss.  The patient was counseled, risk factors were discussed, anticipatory guidance given.  Gross side effects, risk and benefits, and alternatives of medications discussed with patient.  Patient is aware that all medications have potential side effects and we are unable to predict every side effect or drug-drug interaction that may occur.  Expresses verbal understanding and consents to current therapy plan and treatment regimen.  Please see AVS handed out to patient at the end of our visit for further patient instructions/ counseling done pertaining to today's office visit.    Note: This document was prepared using Dragon voice recognition software and may include unintentional dictation errors.  ----------------------------------------------------------------------------------------------------------------------    Subjective:    Chief Complaint  Patient presents with  . Establish Care    HPI: [name] is a pleasant 75 y.o. obese female with a history of moderate bilateral carotid disease, dyslipidemia, hypertension, chronic back pain, chronic left lower quadrant pain, GERD and postsurgical hypothyroidism who presents to [place] at [facility] today to review their medical history with me and establish care.  Caregiving husband due to metastatic bone cancer from prostate cancer    Patient has never really seen a primary care physician regularly. She states that Dr. Warren Danes ( cardiologist ) was essentially her PCP for many, many years.    Patient was recently seen in the ER for sinusitis and was referred here by them.    Patient has been going to see Edwinna Areola, M.D. of OB/GYN- doing all her female care- as well as been treating her thyroid disease prior.   Last PAP- N 3/12, pt last seen 9/16.   Last saw pt for chronic left lower quadrant pain- pt with h/o Diverticulosis.  Pt gets pelvic ultrasounds yearly- history of HRT. Status post hysterectomy in  1981   Moderate bilateral carotid disease:  Patient's last carotid ultrasound in 5/17- showed history of right CEA ( done in 09/11/07) and a 40-60% reocclusion on the right most prominent proximal internal carotid.  Left internal carotid is 40-60% stenosed and increased velocities left distal common carotid\bifurcation region.  She is followed by Dr. Gae Gallop- of vascular and vein specialists [facility], last seen in May 2017.     Seen by Dr. Charmian Muff- Cards- for dyslipidemia, hypertension. Pt denies dizziness, Cp, SOB, vis change   She has a history of chronic back pain and has gotten cortisone injections into her back last one in  6/15 by Dr. Durward Fortes.    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 Harlow Asa in June 2013. She has postsurgical hypothyroidism that is stable and asymptomatic    H/o  normal colonoscopy 05/2013.  Sees Dr. Cristina Gong.   No need further   Patient is a former smoker and quit in January 1977.   Wt Readings from Last 3 Encounters:  06/04/16 180 lb (81.6 kg)  02/14/16 173 lb (78.5 kg)  02/13/16 173 lb 6.4 oz (78.7 kg)   BP Readings from Last 3 Encounters:  06/04/16 (!) 95/56  05/10/16 124/55  02/14/16 117/64   Pulse Readings from Last 3 Encounters:  06/04/16 62  05/10/16 67  02/14/16 (!) 59     Patient Active Problem List   Diagnosis Date Noted  . Right carotid bruit 06/07/2016  . h/o Diverticulosis 06/07/2016  . GERD (gastroesophageal reflux disease) 06/07/2016  . Environmental and seasonal allergies 06/07/2016  . Arthritis 06/07/2016  . Moderate osteopenia 06/07/2016  . Bilateral carotid artery disease ([place]) 06/10/2012  . 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 Dyslipidemia 04/04/2011  . Benign hypertensive heart disease without heart failure 04/04/2011     Past Medical History:  Diagnosis Date  . Arthritis   . Back pain    had cortisone injection 6/15, Dr Durward Fortes  . Carotid artery occlusion   .  Carotid bruit   . Diverticulosis   . GERD (gastroesophageal reflux disease)   . Hyperlipidemia   . Hypertension   . Injury of left leg 06/05/12   Pt. fell  . Peripheral vascular disease ([place])   . Thyroid disease 02-27-12   lt. thyroid nodule, bx. done 5 yrs ago-now some enlargement is seen.     Past Surgical History:  Procedure Laterality Date  . ABDOMINAL HYSTERECTOMY  01/1980  . CAROTID ENDARTERECTOMY  1998   Stable since surgery -slight build up returning-follows with yrly Dopplers  . CATARACT EXTRACTION, BILATERAL  2004 or 2005 per pt  . COLONOSCOPY    . EYE SURGERY  02-27-12   Bil. Cataract surgery  . THYROID LOBECTOMY  03/02/2012   Procedure: THYROID LOBECTOMY;  Surgeon: Earnstine Regal, MD;  Location: [facility];  Service: General;  Laterality: Left;     Family History  Problem Relation Age of Onset  . Heart disease Mother   . Hypertension Mother   . Arthritis Mother   . Stroke Mother   . Hyperlipidemia Mother   . Atrial fibrillation Mother   . Heart disease Brother     before age 45  . Heart attack Neg Hx      History  Drug Use No    History  Alcohol Use  . 0.0 oz/week  . 1 - 2 Glasses of wine per week    Comment: 1-2 drinks a week    History  Smoking Status  . Former Smoker  . Quit date: 09/24/1975  Smokeless Tobacco  . Never Used    Patient's Medications  New Prescriptions   No medications on file  Previous Medications   ACETAMINOPHEN (TYLENOL) 500 MG TABLET    Take 1,000 mg by mouth 2 (two) times daily. For arthritis pain   AMLODIPINE (NORVASC) 2.5 MG TABLET    Take 1 tablet (2.5 mg total) by mouth daily with breakfast.   ASPIRIN 81 MG EC TABLET    Take 162 mg by mouth daily with breakfast. Taking 2 daily   B COMPLEX VITAMINS (VITAMIN-B COMPLEX PO)    Take 1 tablet by mouth daily with breakfast.    CALCIUM PO    Take 600 mg by mouth 3 (three) times a week. 600mg    FUROSEMIDE (LASIX) 40 MG TABLET    Take  40 mg by mouth as needed. For edema    HYDROCHLOROTHIAZIDE (HYDRODIURIL) 25 MG TABLET    Take 1 tablet (25 mg total) by mouth daily.   KRILL OIL PO    Take 500 mg by mouth daily. MEGA RED   LEVOTHYROXINE (SYNTHROID, LEVOTHROID) 112 MCG TABLET    Take 1 tablet (112 mcg total) by mouth daily.   LORATADINE (CLARITIN) 10 MG CAPS    Take 10 mg by mouth as needed (allergies). As needed   METOPROLOL SUCCINATE (TOPROL-XL) 100 MG 24 HR TABLET    TAKE 1 TABLET BY MOUTH DAILY WITH BREAKFAST   MULTIPLE VITAMIN (MULTIVITAMIN) TABLET    Take 1 tablet by mouth daily.     RANITIDINE (ZANTAC) 150 MG TABLET    Take 150 mg by mouth as needed for heartburn.   ROSUVASTATIN (CRESTOR) 20 MG TABLET    TAKE 1 TABLET BY MOUTH DAILY WITH BREAKFAST   TELMISARTAN (MICARDIS) 80 MG TABLET    Take 1 tablet (80 mg total) by mouth daily.   VERAPAMIL (CALAN-SR) 240 MG CR TABLET    TAKE 1 TABLET BY MOUTH DAILY WITH BREAKFAST  Modified Medications   No medications on file  Discontinued Medications   PREDNISONE (DELTASONE) 10 MG TABLET    Take 2 tabs daily x 3 d, 1 tab daily for 3 d, then 1/2 tab daily x 2 d. Take with food    Allergies: Zebeta and Ace inhibitors  Review of Systems:   ( Completed via Adult Medical History Intake form today ) General:  Denies fever, chills, appetite changes, unexplained weight loss.  Optho/Auditory:   Denies visual changes, blurred vision/LOV, ringing in ears/ diff hearing Respiratory:   Denies SOB, DOE, cough, wheezing.  Cardiovascular:   Denies chest pain, palpitations, new onset peripheral edema  Gastrointestinal:   Denies nausea, vomiting, diarrhea.  Genitourinary:    Denies dysuria, increased frequency, flank pain.  Endocrine:     Denies hot or cold intolerance, polyuria, polydipsia. Musculoskeletal:  Denies unexplained myalgias, joint swelling, arthralgias, gait problems.  Skin:  Denies rash, suspicious lesions or new/ changes in moles Neurological:    Denies dizziness, syncope, unexplained weakness, lightheadedness,  numbness  Psychiatric/Behavioral:   Denies mood changes, suicidal or homicidal ideations, hallucinations    Objective:   Blood pressure (!) 95/56, pulse 62, resp. rate 18, weight 180 lb (81.6 kg), last menstrual period 09/24/1979, SpO2 96 %. Body mass index is 30.9 kg/m.   General: Well Developed, well nourished, and in no acute distress.  Neuro: Alert and oriented x3, extra-ocular muscles intact, sensation grossly intact.  HEENT: Normocephalic, atraumatic, pupils equal round reactive to light, neck supple, no gross masses, + carotid bruit R, no JVD apprec Skin: no gross suspicious lesions or rashes  Cardiac: Regular rate and rhythm, no murmurs rubs or gallops.  Respiratory: Essentially clear to auscultation bilaterally. Not using accessory muscles, speaking in full sentences.  Abdominal: Soft, not grossly distended Musculoskeletal: Ambulates w/o diff, FROM * 4 ext.  Vasc: less 2 sec cap RF, warm and pink  Psych:  No HI/SI, judgement and insight good, Euthymic mood. Full Affect.

## 2016-06-07 DIAGNOSIS — J3089 Other allergic rhinitis: Secondary | ICD-10-CM | POA: Insufficient documentation

## 2016-06-07 DIAGNOSIS — K219 Gastro-esophageal reflux disease without esophagitis: Secondary | ICD-10-CM | POA: Insufficient documentation

## 2016-06-07 DIAGNOSIS — M8580 Other specified disorders of bone density and structure, unspecified site: Secondary | ICD-10-CM | POA: Insufficient documentation

## 2016-06-07 DIAGNOSIS — K5790 Diverticulosis of intestine, part unspecified, without perforation or abscess without bleeding: Secondary | ICD-10-CM | POA: Insufficient documentation

## 2016-06-07 DIAGNOSIS — R0989 Other specified symptoms and signs involving the circulatory and respiratory systems: Secondary | ICD-10-CM | POA: Insufficient documentation

## 2016-06-07 DIAGNOSIS — M1990 Unspecified osteoarthritis, unspecified site: Secondary | ICD-10-CM | POA: Insufficient documentation

## 2016-06-07 NOTE — Assessment & Plan Note (Signed)
Claritin daily  Encouraged sinus rinses twice a day and use Flonase if necessary in addition

## 2016-06-07 NOTE — Assessment & Plan Note (Signed)
Patient asymptomatic today

## 2016-06-07 NOTE — Assessment & Plan Note (Addendum)
A little low but patient asymptomatic. Asked to monitor at home.    - Patient currently treated by Dr. Irish Lack of cardiology with HCTZ, Lasix, amlodipine, metoprolol, telmisartan and verapamil

## 2016-06-07 NOTE — Assessment & Plan Note (Signed)
Continue Zantac. Dietary and lifestyle modifications discussed

## 2016-06-07 NOTE — Assessment & Plan Note (Signed)
Encouraged regular exercise of a walking regimen. Discussed that she doesn't use it, she will lose it

## 2016-06-07 NOTE — Assessment & Plan Note (Signed)
Will need blood work in near future.

## 2016-06-07 NOTE — Assessment & Plan Note (Addendum)
No labs since May- followed/txed by Cards.  - prudent diet/ exercise d/c pt.  No time

## 2016-07-01 ENCOUNTER — Ambulatory Visit (INDEPENDENT_AMBULATORY_CARE_PROVIDER_SITE_OTHER): Payer: Medicare Other

## 2016-07-01 DIAGNOSIS — Z23 Encounter for immunization: Secondary | ICD-10-CM

## 2016-07-05 ENCOUNTER — Ambulatory Visit: Payer: Medicare Other

## 2016-08-09 ENCOUNTER — Other Ambulatory Visit: Payer: Self-pay

## 2016-08-09 ENCOUNTER — Other Ambulatory Visit: Payer: Self-pay | Admitting: *Deleted

## 2016-08-09 ENCOUNTER — Telehealth: Payer: Self-pay

## 2016-08-09 DIAGNOSIS — E038 Other specified hypothyroidism: Secondary | ICD-10-CM

## 2016-08-09 MED ORDER — LEVOTHYROXINE SODIUM 112 MCG PO TABS: 112.0000 ug | ORAL_TABLET | Freq: Every day | ORAL | 0 refills | Status: DC

## 2016-08-09 MED ORDER — ROSUVASTATIN CALCIUM 20 MG PO TABS: 20.0000 mg | ORAL_TABLET | Freq: Every day | ORAL | 1 refills | Status: DC

## 2016-08-09 NOTE — Telephone Encounter (Signed)
Medication refill request: Levothyroxine Sodium 152mcg #90, 1 tab.daily Last AEX:  05-19-15 Next AEX: 09-03-16 Last MMG (if hormonal medication request): 02-09-16 Neg/[id number] Refill authorized: 1

## 2016-08-09 NOTE — Telephone Encounter (Signed)
Opened in error

## 2016-08-11 NOTE — Progress Notes (Signed)
Patient ID: Maria FATICA, female   DOB: 1941/05/18, 75 y.o.   MRN: [id number]     Cardiology Office Note   Date:  08/13/2016   ID:  YUDI PELCZAR, DOB [birth year], MRN [id number]  PCP:  Mellody Dance, DO    No chief complaint on file. f/u PVD   Wt Readings from Last 3 Encounters:  08/13/16 78.1 kg (172 lb 3.2 oz)  06/04/16 81.6 kg (180 lb)  02/14/16 78.5 kg (173 lb)       History of Present Illness: [name] is Pham 75 y.o. female  has Pham past history of dyslipidemia.  She was previously followed by Dr. Mare Ferrari.  She has Pham history of known residual right carotid bruit after remote right carotid endarterectomy. She's had Pham recent carotid Doppler which showed evidence of plaque reduction and excellent flow. . She does not have any history of ischemic heart disease. She had Pham normal nuclear stress test in 2005. She has had essential hypertension and exogenous obesity. She is euthyroid on levothyroxine. 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 She is under more stress. Home health has been coming in. She also has home instead coming in.  [name] has been feeling well with no new cardiac symptoms. No TIA symptoms. No chest pain or shortness of breath. She does not get much intentional exercise but stays busy as the caregiver for her husband. She does have Pham treadmill out in the basement that she intends to start using.  She knows that she has to exercise to do something for herself. She realizes this will be good for her physically and mentally.  Her thyroid disease is followed by her GYN physician. She follows with vascular surgery regarding her carotid disease.  She had shingles on the left scalp.      Past Medical History:  Diagnosis Date  . Arthritis   . Back pain    had cortisone injection 6/15, Dr Durward Fortes  . Carotid artery occlusion   . Carotid bruit   . Diverticulosis   . GERD  (gastroesophageal reflux disease)   . Hyperlipidemia   . Hypertension   . Injury of left leg 06/05/12   Pt. fell  . Peripheral vascular disease ([place])   . Thyroid disease 02-27-12   lt. thyroid nodule, bx. done 5 yrs ago-now some enlargement is seen.    Past Surgical History:  Procedure Laterality Date  . ABDOMINAL HYSTERECTOMY  01/1980  . CAROTID ENDARTERECTOMY  1998   Stable since surgery -slight build up returning-follows with yrly Dopplers  . CATARACT EXTRACTION, BILATERAL  2004 or 2005 per pt  . COLONOSCOPY    . EYE SURGERY  02-27-12   Bil. Cataract surgery  . THYROID LOBECTOMY  03/02/2012   Procedure: THYROID LOBECTOMY;  Surgeon: Earnstine Regal, MD;  Location: [facility];  Service: General;  Laterality: Left;     Current Outpatient Prescriptions  Medication Sig Dispense Refill  . acetaminophen (TYLENOL) 500 MG tablet Take 1,000 mg by mouth 2 (two) times daily. For arthritis pain    . amLODipine (NORVASC) 2.5 MG tablet Take 1 tablet (2.5 mg total) by mouth daily with breakfast. 90 tablet 2  . aspirin 81 MG EC tablet Take 162 mg by mouth daily with breakfast. Taking 2 daily    . B Complex Vitamins (VITAMIN-B COMPLEX PO) Take 1 tablet by mouth daily with breakfast.     .  CALCIUM PO Take 600 mg by mouth 3 (three) times Pham week. 600mg     . furosemide (LASIX) 40 MG tablet Take 40 mg by mouth as needed. For edema    . hydrochlorothiazide (HYDRODIURIL) 25 MG tablet Take 1 tablet (25 mg total) by mouth daily. 30 tablet 10  . KRILL OIL PO Take 500 mg by mouth daily. MEGA RED    . levothyroxine (SYNTHROID, LEVOTHROID) 112 MCG tablet Take 1 tablet (112 mcg total) by mouth daily. 90 tablet 0  . Loratadine (CLARITIN) 10 MG CAPS Take 10 mg by mouth as needed (allergies). As needed    . metoprolol succinate (TOPROL-XL) 100 MG 24 hr tablet TAKE 1 TABLET BY MOUTH DAILY WITH BREAKFAST 30 tablet 10  . Multiple Vitamin (MULTIVITAMIN) tablet Take 1 tablet by mouth daily.      . ranitidine (ZANTAC) 150 MG  tablet Take 150 mg by mouth as needed for heartburn.    . rosuvastatin (CRESTOR) 20 MG tablet Take 1 tablet (20 mg total) by mouth daily with breakfast. 90 tablet 1  . telmisartan (MICARDIS) 80 MG tablet Take 1 tablet (80 mg total) by mouth daily. 30 tablet 10  . verapamil (CALAN-SR) 240 MG CR tablet TAKE 1 TABLET BY MOUTH DAILY WITH BREAKFAST 30 tablet 11   No current facility-administered medications for this visit.     Allergies:   Zebeta and Ace inhibitors    Social History:  The patient  reports that she quit smoking about 40 years ago. She has never used smokeless tobacco. She reports that she drinks alcohol. She reports that she does not use drugs.   Family History:  The patient's family history includes Arthritis in her mother; Atrial fibrillation in her mother; Heart disease in her brother and mother; Hyperlipidemia in her mother; Hypertension in her mother; Stroke in her mother.    ROS:  Please see the history of present illness.   Otherwise, review of systems are positive for lack of exercise.   All other systems are reviewed and negative.    PHYSICAL EXAM: VS:  BP 118/62   Pulse 65   Ht 5\' 3"  (1.6 m)   Wt 78.1 kg (172 lb 3.2 oz)   LMP 09/24/1979   SpO2 95%   BMI 30.50 kg/m  , BMI Body mass index is 30.5 kg/m. GEN: Well nourished, well developed, in no acute distress  HEENT: normal  Neck: no JVD, 2/6 right carotid bruit; no left carotid bruit; no masses Cardiac: RRR; no murmurs, rubs, or gallops,no edema  Respiratory:  clear to auscultation bilaterally, normal work of breathing GI: soft, nontender, nondistended, + BS MS: no deformity or atrophy  Skin: warm and dry, no rash Neuro:  Strength and sensation are intact Psych: euthymic mood, full affect   EKG:   The ekg ordered today demonstrates NSR, no ST segment changes   Recent Labs: 02/13/2016: ALT 22; BUN 26; Creat 1.06; Potassium 4.5; Sodium 138   Lipid Panel    Component Value Date/Time   CHOL 145  02/13/2016 1157   TRIG 202 (H) 02/13/2016 1157   HDL 38 (L) 02/13/2016 1157   CHOLHDL 3.8 02/13/2016 1157   VLDL 40 (H) 02/13/2016 1157   LDLCALC 67 02/13/2016 1157   LDLDIRECT 73.4 01/07/2013 0939     Other studies Reviewed: Additional studies/ records that were reviewed today with results demonstrating: prior carotid DOppler.   ASSESSMENT AND PLAN:  1. Hypertensive heart disease without heart failure: BP well controlled.  COntinue  current meds.  2. Hypercholesterolemia: COntrolled in 5/17.  COntinue current lipid lowering therapy.  Recheck in 5/18. 3. asymptomatic right carotid bruit followed by vascular surgeons: post CEA.  Moderate bilateral disease. I stressed the importance of lifestyle modifications. She needs to try to get more exercise with Pham target as noted below. She has Pham treadmill at the house.  She is losing weight slowly.  Occasionally feels Pham pulsation in the right carotid but this resolved with turning her head.  Continue f/u Dopplers. Hypothyroid: followed by GYN.    Current medicines are reviewed at length with the patient today.  The patient concerns regarding her medicines were addressed.  The following changes have been made:  No change  Labs/ tests ordered today include:   No orders of the defined types were placed in this encounter.   Recommend 150 minutes/week of aerobic exercise Low fat, low carb, high fiber diet recommended  Disposition:   FU in 6 months   Signed, Larae Grooms, MD  08/13/2016 11:13 AM    [facility] [address], [place], [place]  [address] Phone: [phone]; Fax: [phone]

## 2016-08-13 ENCOUNTER — Encounter (INDEPENDENT_AMBULATORY_CARE_PROVIDER_SITE_OTHER): Payer: Self-pay

## 2016-08-13 ENCOUNTER — Ambulatory Visit (INDEPENDENT_AMBULATORY_CARE_PROVIDER_SITE_OTHER): Payer: Medicare Other | Admitting: Interventional Cardiology

## 2016-08-13 ENCOUNTER — Ambulatory Visit: Payer: Medicare Other | Admitting: Interventional Cardiology

## 2016-08-13 ENCOUNTER — Encounter: Payer: Self-pay | Admitting: Interventional Cardiology

## 2016-08-13 VITALS — BP 118/62 | HR 65 | Ht 63.0 in | Wt 172.2 lb

## 2016-08-13 DIAGNOSIS — I739 Peripheral vascular disease, unspecified: Secondary | ICD-10-CM

## 2016-08-13 DIAGNOSIS — I119 Hypertensive heart disease without heart failure: Secondary | ICD-10-CM

## 2016-08-13 DIAGNOSIS — I779 Disorder of arteries and arterioles, unspecified: Secondary | ICD-10-CM | POA: Diagnosis not present

## 2016-08-13 DIAGNOSIS — E785 Hyperlipidemia, unspecified: Secondary | ICD-10-CM | POA: Diagnosis not present

## 2016-08-13 NOTE — Patient Instructions (Signed)
Medication Instructions:  Same-no changes  Labwork: None  Testing/Procedures: None  Follow-Up: Your physician wants you to follow-up in: 6 months. You will receive a reminder letter in the mail two months in advance. If you don't receive a letter, please call our office to schedule the follow-up appointment.      If you need a refill on your cardiac medications before your next appointment, please call your pharmacy.   

## 2016-08-26 ENCOUNTER — Other Ambulatory Visit (INDEPENDENT_AMBULATORY_CARE_PROVIDER_SITE_OTHER): Payer: Medicare Other

## 2016-08-26 ENCOUNTER — Ambulatory Visit: Payer: Medicare Other | Admitting: Family Medicine

## 2016-08-26 DIAGNOSIS — M8580 Other specified disorders of bone density and structure, unspecified site: Secondary | ICD-10-CM | POA: Diagnosis not present

## 2016-08-26 DIAGNOSIS — K5750 Diverticulosis of both small and large intestine without perforation or abscess without bleeding: Secondary | ICD-10-CM

## 2016-08-26 DIAGNOSIS — E785 Hyperlipidemia, unspecified: Secondary | ICD-10-CM

## 2016-08-26 DIAGNOSIS — Z0000 Encounter for general adult medical examination without abnormal findings: Secondary | ICD-10-CM

## 2016-08-26 DIAGNOSIS — E890 Postprocedural hypothyroidism: Secondary | ICD-10-CM

## 2016-08-26 DIAGNOSIS — I119 Hypertensive heart disease without heart failure: Secondary | ICD-10-CM

## 2016-08-26 DIAGNOSIS — M1990 Unspecified osteoarthritis, unspecified site: Secondary | ICD-10-CM | POA: Diagnosis not present

## 2016-08-26 DIAGNOSIS — K219 Gastro-esophageal reflux disease without esophagitis: Secondary | ICD-10-CM

## 2016-08-26 DIAGNOSIS — I779 Disorder of arteries and arterioles, unspecified: Secondary | ICD-10-CM

## 2016-08-26 DIAGNOSIS — R0989 Other specified symptoms and signs involving the circulatory and respiratory systems: Secondary | ICD-10-CM | POA: Diagnosis not present

## 2016-08-26 DIAGNOSIS — I739 Peripheral vascular disease, unspecified: Secondary | ICD-10-CM

## 2016-08-26 LAB — CBC WITH DIFFERENTIAL/PLATELET
BASOS ABS: 0 {cells}/uL (ref 0–200)
BASOS PCT: 0 %
EOS ABS: 415 {cells}/uL (ref 15–500)
Eosinophils Relative: 5 %
HEMATOCRIT: 35.3 % (ref 35.0–45.0)
HEMOGLOBIN: 12.1 g/dL (ref 11.7–15.5)
LYMPHS ABS: 2075 {cells}/uL (ref 850–3900)
Lymphocytes Relative: 25 %
MCH: 31.7 pg (ref 27.0–33.0)
MCHC: 34.3 g/dL (ref 32.0–36.0)
MCV: 92.4 fL (ref 80.0–100.0)
MONO ABS: 996 {cells}/uL — AB (ref 200–950)
MPV: 9.7 fL (ref 7.5–12.5)
Monocytes Relative: 12 %
NEUTROS ABS: 4814 {cells}/uL (ref 1500–7800)
Neutrophils Relative %: 58 %
PLATELETS: 307 10*3/uL (ref 140–400)
RBC: 3.82 MIL/uL (ref 3.80–5.10)
RDW: 13.6 % (ref 11.0–15.0)
WBC: 8.3 10*3/uL (ref 3.8–10.8)

## 2016-08-26 LAB — TSH: TSH: 1.9 m[IU]/L

## 2016-08-26 LAB — T4, FREE: Free T4: 1.3 ng/dL (ref 0.8–1.8)

## 2016-08-27 LAB — COMPLETE METABOLIC PANEL WITH GFR
ALBUMIN: 4.2 g/dL (ref 3.6–5.1)
ALK PHOS: 63 U/L (ref 33–130)
ALT: 19 U/L (ref 6–29)
AST: 18 U/L (ref 10–35)
BILIRUBIN TOTAL: 0.6 mg/dL (ref 0.2–1.2)
BUN: 23 mg/dL (ref 7–25)
CALCIUM: 9.5 mg/dL (ref 8.6–10.4)
CO2: 25 mmol/L (ref 20–31)
CREATININE: 1 mg/dL — AB (ref 0.60–0.93)
Chloride: 104 mmol/L (ref 98–110)
GFR, EST AFRICAN AMERICAN: 63 mL/min (ref >=60)
GFR, EST NON AFRICAN AMERICAN: 54 mL/min — AB (ref >=60)
Glucose, Bld: 90 mg/dL (ref 65–99)
Potassium: 4.7 mmol/L (ref 3.5–5.3)
Sodium: 139 mmol/L (ref 135–146)
TOTAL PROTEIN: 6.8 g/dL (ref 6.1–8.1)

## 2016-08-27 LAB — LIPID PANEL
Cholesterol: 145 mg/dL (ref <200)
HDL: 37 mg/dL — AB (ref >50)
LDL CALC: 76 mg/dL (ref <100)
TRIGLYCERIDES: 158 mg/dL — AB (ref <150)
Total CHOL/HDL Ratio: 3.9 Ratio (ref <5.0)
VLDL: 32 mg/dL — AB (ref <30)

## 2016-08-27 LAB — VITAMIN B12: Vitamin B-12: 394 pg/mL (ref 200–1100)

## 2016-08-27 LAB — VITAMIN D 25 HYDROXY (VIT D DEFICIENCY, FRACTURES): VIT D 25 HYDROXY: 19 ng/mL — AB (ref 30–100)

## 2016-09-03 ENCOUNTER — Encounter: Payer: Self-pay | Admitting: Obstetrics & Gynecology

## 2016-09-03 ENCOUNTER — Ambulatory Visit (INDEPENDENT_AMBULATORY_CARE_PROVIDER_SITE_OTHER): Payer: Medicare Other | Admitting: Obstetrics & Gynecology

## 2016-09-03 VITALS — BP 120/60 | HR 80 | Resp 16 | Ht 62.25 in | Wt 174.0 lb

## 2016-09-03 DIAGNOSIS — Z0000 Encounter for general adult medical examination without abnormal findings: Secondary | ICD-10-CM | POA: Diagnosis not present

## 2016-09-03 DIAGNOSIS — R1031 Right lower quadrant pain: Secondary | ICD-10-CM | POA: Diagnosis not present

## 2016-09-03 DIAGNOSIS — Z01419 Encounter for gynecological examination (general) (routine) without abnormal findings: Secondary | ICD-10-CM

## 2016-09-03 DIAGNOSIS — E038 Other specified hypothyroidism: Secondary | ICD-10-CM

## 2016-09-03 DIAGNOSIS — G8929 Other chronic pain: Secondary | ICD-10-CM | POA: Diagnosis not present

## 2016-09-03 LAB — POCT URINALYSIS DIPSTICK
Bilirubin, UA: NEGATIVE
Blood, UA: NEGATIVE
Glucose, UA: NEGATIVE
KETONES UA: NEGATIVE
NITRITE UA: NEGATIVE
PH UA: 5
PROTEIN UA: NEGATIVE
Urobilinogen, UA: NEGATIVE

## 2016-09-03 MED ORDER — LEVOTHYROXINE SODIUM 112 MCG PO TABS: 112.0000 ug | ORAL_TABLET | Freq: Every day | ORAL | 4 refills | Status: DC

## 2016-09-03 NOTE — Progress Notes (Signed)
75 y.o. G2P2 MarriedCaucasianF here for annual exam.  Doing well.  Giving lots of care to her husband.  He has not really changed during the past year.  Using Home Instead as help.  Denies vaginal bleeding.    She had shingles in August.  Dr. Mare Ferrari retired in March so at that time, pt didn't have a PCP at that time.  She was seen in Urgent Care.  Was treated with Valtrex and Neurontin by Dr. Ubaldo Pham at [facility].  Took about a week for it to calm down.    Denies vaginal bleeding.  PCP:  Dr. Raliegh Pham.  Patient's last menstrual period was 09/24/1979.          Sexually active: No.  The current method of family planning is post menopausal status.    Exercising: No.  The patient does not participate in regular exercise at present. Smoker:  no  Health Maintenance: Pap:  11/2010 Normal  History of abnormal Pap:  no MMG:  02/09/16 BIRADS1:Neg  Colonoscopy:  06/11/13. Normal. Not repeat necessary.  BMD:   02/05/13 Osteopenia  TDaP:  2012 Pneumonia vaccine(s):  Done Zostavax:   Done  Hep C testing:  No Screening Labs: CA125 Urine today: WBC=Trace   reports that she quit smoking about 40 years ago. She has never used smokeless tobacco. She reports that she drinks alcohol. She reports that she does not use drugs.  Past Medical History:  Diagnosis Date  . Arthritis   . Back pain    had cortisone injection 6/15, Dr Durward Fortes  . Carotid artery occlusion   . Carotid bruit   . Diverticulosis   . GERD (gastroesophageal reflux disease)   . Hyperlipidemia   . Hypertension   . Injury of left leg 06/05/12   Pt. fell  . Peripheral vascular disease ([place])   . Thyroid disease 02-27-12   lt. thyroid nodule, bx. done 5 yrs ago-now some enlargement is seen.    Past Surgical History:  Procedure Laterality Date  . ABDOMINAL HYSTERECTOMY  01/1980  . CAROTID ENDARTERECTOMY  1998   Stable since surgery -slight build up returning-follows with yrly Dopplers  . CATARACT EXTRACTION, BILATERAL  2004 or 2005 per  pt  . COLONOSCOPY    . EYE SURGERY  02-27-12   Bil. Cataract surgery  . THYROID LOBECTOMY  03/02/2012   Procedure: THYROID LOBECTOMY;  Surgeon: Maria Regal, MD;  Location: [facility];  Service: General;  Laterality: Left;    Current Outpatient Prescriptions  Medication Sig Dispense Refill  . acetaminophen (TYLENOL) 500 MG tablet Take 1,000 mg by mouth 2 (two) times daily. For arthritis pain    . amLODipine (NORVASC) 2.5 MG tablet Take 1 tablet (2.5 mg total) by mouth daily with breakfast. 90 tablet 2  . aspirin 81 MG EC tablet Take 162 mg by mouth daily with breakfast. Taking 2 daily    . B Complex Vitamins (VITAMIN-B COMPLEX PO) Take 1 tablet by mouth daily with breakfast.     . CALCIUM PO Take 600 mg by mouth 3 (three) times a week. 600mg     . Cholecalciferol (VITAMIN D3) 5000 units CAPS Take by mouth daily.    . furosemide (LASIX) 40 MG tablet Take 40 mg by mouth as needed. For edema    . hydrochlorothiazide (HYDRODIURIL) 25 MG tablet Take 1 tablet (25 mg total) by mouth daily. 30 tablet 10  . KRILL OIL PO Take 500 mg by mouth daily. MEGA RED    . levothyroxine (  SYNTHROID, LEVOTHROID) 112 MCG tablet Take 1 tablet (112 mcg total) by mouth daily. 90 tablet 0  . Loratadine (CLARITIN) 10 MG CAPS Take 10 mg by mouth as needed (allergies). As needed    . metoprolol succinate (TOPROL-XL) 100 MG 24 hr tablet TAKE 1 TABLET BY MOUTH DAILY WITH BREAKFAST 30 tablet 10  . Multiple Vitamin (MULTIVITAMIN) tablet Take 1 tablet by mouth daily.      . ranitidine (ZANTAC) 150 MG tablet Take 150 mg by mouth as needed for heartburn.    . rosuvastatin (CRESTOR) 20 MG tablet Take 1 tablet (20 mg total) by mouth daily with breakfast. 90 tablet 1  . telmisartan (MICARDIS) 80 MG tablet Take 1 tablet (80 mg total) by mouth daily. 30 tablet 10  . verapamil (CALAN-SR) 240 MG CR tablet TAKE 1 TABLET BY MOUTH DAILY WITH BREAKFAST 30 tablet 11   No current facility-administered medications for this visit.     Family  History  Problem Relation Age of Onset  . Heart disease Mother   . Hypertension Mother   . Arthritis Mother   . Stroke Mother   . Hyperlipidemia Mother   . Atrial fibrillation Mother   . Heart disease Brother     before age 66  . Heart attack Neg Hx     ROS:  Pertinent items are noted in HPI.  Otherwise, a comprehensive ROS was negative.  Exam:   BP 120/60 (BP Location: Right Arm, Patient Position: Sitting, Cuff Size: Normal)   Pulse 80   Resp 16   Ht 5' 2.25" (1.581 m)   Wt 174 lb (78.9 kg)   LMP 09/24/1979   BMI 31.57 kg/m   Weight change: -5#  Height: 5' 2.25" (158.1 cm)  Ht Readings from Last 3 Encounters:  09/03/16 5' 2.25" (1.581 m)  08/13/16 5\' 3"  (1.6 m)  02/14/16 5\' 4"  (1.626 m)    General appearance: alert, cooperative and appears stated age Head: Normocephalic, without obvious abnormality, atraumatic Neck: no adenopathy, supple, symmetrical, trachea midline and no neck masses Lungs: clear to auscultation bilaterally Breasts: normal appearance, no masses or tenderness Heart: regular rate and rhythm Abdomen: soft, non-tender; bowel sounds normal; no masses,  no organomegaly Extremities: extremities normal, atraumatic, no cyanosis or edema Skin: Skin color, texture, turgor normal. No rashes or lesions Lymph nodes: Cervical, supraclavicular, and axillary nodes normal. No abnormal inguinal nodes palpated Neurologic: Grossly normal   Pelvic: External genitalia:  no lesions              Urethra:  normal appearing urethra with no masses, tenderness or lesions              Bartholins and Skenes: normal                 Vagina: normal appearing vagina with normal color and discharge, no lesions              Cervix: absent              Pap taken: No. Bimanual Exam:  Uterus:  uterus absent              Adnexa: normal adnexa               Rectovaginal: Confirms               Anus:  normal sphincter tone, no lesions  Chaperone was present for exam.  A:  Well Woman  with normal exam  PMP off HRT  H/O TVH, ovaries remain  Diverticulosis, chronic LLQ pain. (H/O soft tissue mass noted on U/S 2008. Dr. Cristina Pham feels this is diverticulosis related. Neg Ca-125). Last colonoscopy 2014.  Does not need another colonoscopy.  Normal PUS 9/16. 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  Pap smear not indicated RS for Synthroid 131mcg qday. #90/4RF  Ca-125 pending Return annually or prn

## 2016-09-04 LAB — CA 125: CA 125: 9 U/mL (ref <35)

## 2016-09-09 ENCOUNTER — Other Ambulatory Visit: Payer: Self-pay | Admitting: *Deleted

## 2016-09-09 MED ORDER — METOPROLOL SUCCINATE ER 100 MG PO TB24: 100.0000 mg | ORAL_TABLET | Freq: Every day | ORAL | 3 refills | Status: DC

## 2016-09-30 ENCOUNTER — Ambulatory Visit (INDEPENDENT_AMBULATORY_CARE_PROVIDER_SITE_OTHER): Payer: Medicare Other | Admitting: Family Medicine

## 2016-09-30 ENCOUNTER — Encounter: Payer: Self-pay | Admitting: Family Medicine

## 2016-09-30 VITALS — BP 115/67 | HR 83 | Ht 62.25 in | Wt 176.0 lb

## 2016-09-30 DIAGNOSIS — R748 Abnormal levels of other serum enzymes: Secondary | ICD-10-CM | POA: Diagnosis not present

## 2016-09-30 DIAGNOSIS — E890 Postprocedural hypothyroidism: Secondary | ICD-10-CM

## 2016-09-30 DIAGNOSIS — E559 Vitamin D deficiency, unspecified: Secondary | ICD-10-CM

## 2016-09-30 DIAGNOSIS — I779 Disorder of arteries and arterioles, unspecified: Secondary | ICD-10-CM | POA: Diagnosis not present

## 2016-09-30 DIAGNOSIS — I119 Hypertensive heart disease without heart failure: Secondary | ICD-10-CM | POA: Diagnosis not present

## 2016-09-30 DIAGNOSIS — E785 Hyperlipidemia, unspecified: Secondary | ICD-10-CM | POA: Diagnosis not present

## 2016-09-30 DIAGNOSIS — I739 Peripheral vascular disease, unspecified: Secondary | ICD-10-CM

## 2016-09-30 DIAGNOSIS — E669 Obesity, unspecified: Secondary | ICD-10-CM | POA: Diagnosis not present

## 2016-09-30 DIAGNOSIS — E781 Pure hyperglyceridemia: Secondary | ICD-10-CM

## 2016-09-30 NOTE — Progress Notes (Signed)
Impression and Recommendations:    1. Hypertriglyceridemia   2. Vitamin D deficiency   3. Low serum HDL   4. Obesity, Class I, BMI 30-34.9   5. Bilateral carotid artery disease ([place])   6. Benign hypertensive heart disease without heart failure   7. Dyslipidemia- low HDL and inc TG   8. Hypothyroidism, postsurgical     Vitamin D deficiency Patient's vitamin D was 19 this last time when checked. We went from 1000 vitamin D daily to 5000.  After one mo---> pt just hurts less in hips.  Recheck in 4 months. If still not in the normal range, will add a weekly in addition to her daily.   Dyslipidemia- low HDL and inc VLDL & TG Diet and exercise counseling done in addition to meds by Cards  Handout given to pt  Obesity, Class I, BMI 30-34.9 Explained to patient what BMI refers to, and what it means medically.    Told patient to think about it as a "medical risk stratification measurement" and how increasing BMI is associated with increasing risk/ or worsening state of various diseases such as hypertension, hyperlipidemia, diabetes, premature OA, depression etc.  American Heart Association guidelines for healthy diet, basically Mediterranean diet, and exercise guidelines of 30 minutes 5 days per week or more discussed in detail.  Health counseling performed.  All questions answered.  Benign hypertensive heart disease without heart failure Well controlled at 115/67  meds per cards, follows up w Vasc as well  Bilateral carotid artery disease ([place]) F/up with Vasc regularly  Hypothyroidism, postsurgical tsh stable, pt Asx  The patient was counseled, risk factors were discussed, anticipatory guidance given.   Return for 4 mo--> reck with me--also will obtain vit D, A1c.  Please see AVS handed out to patient at the end of our visit for further patient instructions/ counseling done pertaining to today's office visit.    Note: This document was prepared using Dragon voice  recognition software and may include unintentional dictation errors.   --------------------------------------------------------------------------------------------------------------------------------------------------------------------------------------------------------------------------------------------    Subjective:    CC:  Chief Complaint  Patient presents with  . Follow-up    HPI: [name] is a 76 y.o. female who presents to [facility] [facility] today for review and discussion of recent bloodwork that was done on 08/26/16.  1. All recent blood work that we ordered was reviewed with patient today.  Patient was counseled on all abnormalities and we discussed dietary and lifestyle changes that could help those values (also medications when appropriate).  Extensive health counseling performed and all patient's concerns/ questions were addressed.   2. 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 She is under more stress.     Wt Readings from Last 3 Encounters:  02/19/17 177 lb 6.4 oz (80.5 kg)  02/12/17 176 lb (79.8 kg)  09/30/16 176 lb (79.8 kg)   BP Readings from Last 3 Encounters:  02/19/17 120/60  02/12/17 133/64  09/30/16 115/67   Pulse Readings from Last 3 Encounters:  02/19/17 64  02/12/17 73  09/30/16 83   BMI Readings from Last 3 Encounters:  02/19/17 31.42 kg/m  02/12/17 29.06 kg/m  09/30/16 31.93 kg/m     Patient Care Team    Relationship Specialty Notifications Start End  Mellody Dance, DO PCP - General Family Medicine  05/10/16   Jettie Booze, MD Consulting Physician Cardiology  02/14/16   Garald Balding, MD Consulting Physician Orthopedic Surgery  06/04/16   Martinique, Amy, MD Consulting Physician Dermatology  06/04/16   Megan Salon, MD Consulting Physician Gynecology  06/04/16   Angelia Mould, MD Consulting Physician Vascular Surgery   06/07/16    Comment: follows carotids;    Sees Nickel, Vinnie Level, NP    Patient Active Problem List   Diagnosis Date Noted  . Obesity, Class I, BMI 30-34.9 09/30/2016    Priority: High  . Bilateral carotid artery disease ([place]) 06/10/2012    Priority: High  . Dyslipidemia- low HDL and inc VLDL & TG 04/04/2011    Priority: High  . Benign hypertensive heart disease without heart failure 04/04/2011    Priority: High  . Right carotid bruit 06/07/2016    Priority: Medium  . GERD (gastroesophageal reflux disease) 06/07/2016    Priority: Medium  . Moderate osteopenia 06/07/2016    Priority: Medium  . Hypothyroidism, postsurgical 05/05/2012    Priority: Medium  . Vitamin D deficiency 09/30/2016    Priority: Low  . h/o Diverticulosis 06/07/2016    Priority: Low  . Environmental and seasonal allergies 06/07/2016    Priority: Low  . Arthritis 06/07/2016    Priority: Low    Past Medical history, Surgical history, Family history, Social history, Allergies and Medications have been entered into the medical record, reviewed and changed as needed.   Allergies:  Allergies  Allergen Reactions  . Zebeta Other (See Comments)    Does not work, per patient.  . Ace Inhibitors Other (See Comments)    cough    Review of Systems  Constitutional: Negative for chills and fever.  HENT: Negative for congestion and sinus pain.   Eyes: Negative for blurred vision and double vision.  Respiratory: Negative for shortness of breath and wheezing.   Cardiovascular: Negative for chest pain, palpitations and orthopnea.  Gastrointestinal: Positive for constipation. Negative for diarrhea, nausea and vomiting.       Chronic  Genitourinary: Negative for frequency and hematuria.  Musculoskeletal: Positive for joint pain. Negative for falls.       Chronic jt pains  Skin: Negative for rash.  Neurological: Negative for dizziness, sensory change and focal weakness.  Endo/Heme/Allergies: Positive for polydipsia.         Thirsty- thinks she never drinks enough water.  Psychiatric/Behavioral: Negative for depression and suicidal ideas. The patient has insomnia. The patient is not nervous/anxious.        Chronic     Objective:   Blood pressure 115/67, pulse 83, height 5' 2.25" (1.581 m), weight 176 lb (79.8 kg), last menstrual period 09/24/1979. Body mass index is 31.93 kg/m. General: Well Developed, well nourished, appropriate for stated age.  Neuro: Alert and oriented x3, extra-ocular muscles intact, sensation grossly intact.  HEENT: Normocephalic, atraumatic, neck supple, + Bilateral carotid bruits appreciated  Skin: no gross rash. Cardiac: RRR, S1 S2 Respiratory: ECTA B/L, Not using accessory muscles, speaking in full sentences-unlabored. Vascular:  Ext warm, dry, pink; cap RF less 2 sec., no peripheral edema. Psych: No HI/SI, judgement and insight good, Euthymic mood. Full Affect.     Ref Range & Units  (08/26/16) 29yrago (02/13/16) 14yrgo (09/05/15)   Cholesterol <200 mg/dL 145  145R  133R   Comment: ** Please note change in reference range(s). **      Triglycerides <150 mg/dL 158   <150 mg/dL" class="rz_c hlt1024"> 202   <150 mg/dL" class="rz_d hlt1024"> 197    Comment: **  Please note change in reference range(s). **      HDL >50 mg/dL 37   38R   37R    Comment: ** Please note change in reference range(s). **      Total CHOL/HDL Ratio <5.0 Ratio 3.9  <=5.0 Ratio" class="rz_c hlt1024"> 3.8R  <=5.0 Ratio" class="rz_d hlt1024"> 3.6R    VLDL <30 mg/dL 32   <30 mg/dL" class="rz_c hlt1024"> 40   <30 mg/dL" class="rz_d hlt1024"> 39     LDL Cholesterol <100 mg/dL 76  <130 mg/dL" class="rz_c hlt1024"> 67R, CM  <130 mg/dL" class="rz_d hlt1024"> 57R, CM   Comment: ** Please note change in reference range(s). **     Resulting Agency  [facility]  Narrative   Performed at: [address], [address]        [place], [place]  [address]    Specimen Collected: 08/26/16 10:35 Last Resulted: 08/27/16 02:22            CM=Additional commentsR=Reference range differs from displayed range        Other Results from 08/26/2016     CBC with Differential/Platelet  Order: [id number]   Status:  Final result Visible to patient:  No (Not Released) Next appt:  08/11/2018 at 02:00 PM in Cardiology ([facility]) Dx:  Benign hypertensive heart disease wit...     Notes Recorded by Fonnie Mu, CMA on 08/28/2016 at 1:15 PM EST 08/28/16 Pt informed of results. Pt expressed understanding and is agreeable. Pt transferred to front desk to schedule appointment. Charyl Bigger, CMA  ------  Notes Recorded by Mellody Dance, DO on 08/27/2016 at 8:19 AM EST Please inform patient that there are several abnormal lab values that we need discuss in person and devise a game plan together of how to improve them---->  she has not been seen since September which was the only time I had seen her/ was first visit and she was told to follow-up in the very near future and never did. She is 76 years old and needs to be followed more regularly as we discussed in our office visit.  Until next office visit have her take 5000 IUs vitamin D3 over-the-counter daily, drink more water to one half of her weight in ounces of water per day, take over-the-counter slow release niacin, exercise more- and we will discuss the specifics on her follow-up office visit.  Please have him/her make a follow-up appointment with me at their earliest convenience if they don't already have one.  Thanks, Dr Jenetta Downer    Ref Range & Units  38yrago   WBC 3.8 - 10.8 K/uL 8.3  7.8R    RBC 3.80 - 5.10 MIL/uL 3.82  4.28R    Hemoglobin 11.7 - 15.5 g/dL 12.1  13.5R    HCT 35.0 - 45.0 % 35.3  38.8R    MCV 80.0 - 100.0 fL 92.4  90.7R    MCH 27.0 - 33.0 pg 31.7  31.5R    MCHC 32.0 - 36.0 g/dL 34.3  34.8R    RDW 11.0 - 15.0 % 13.6  13.0R    Platelets 140 - 400 K/uL 307  278R     MPV 7.5 - 12.5 fL 9.7     Neutro Abs 1,500 - 7,800 cells/uL 4,814     Lymphs Abs 850 - 3,900 cells/uL 2,075     Monocytes Absolute 200 - 950 cells/uL 996      Eosinophils Absolute  15 - 500 cells/uL 415     Basophils Absolute 0 - 200 cells/uL 0     Neutrophils Relative % % 58     Lymphocytes Relative % 25     Monocytes Relative % 12     Eosinophils Relative % 5     Basophils Relative % 0     Smear Review  Criteria for review not met    Resulting Agency  [facility]  Narrative   Performed at: [address], [address]        [place], [place] [address]    Specimen Collected: 08/26/16 10:35 Last Resulted: 08/26/16 22:56            R=Reference range differs from displayed range             Vitamin B12  Order: [id number]   Ref Range & Units 3moago  Vitamin B-12 200 - 1,100 pg/mL 394   Resulting Agency  [facility]  Narrative   Performed at: [address]       [place], [place] [address]   Specimen Collected: 08/26/16 10:35 Last Resulted: 08/27/16 00:08                     VITAMIN D 25 Hydroxy (Vit-D Deficiency, Fractures)  Order: [id number]   Newer results are available. Click to view them now.   Ref Range & Units 637mogo  Vit D, 25-Hydroxy 30 - 100 ng/mL 19    Comment: Vitamin D Status      25-OH Vitamin D     Deficiency        <20 ng/mL     Insufficiency     20 - 29 ng/mL     Optimal       > or = 30 ng/mL    For 25-OH Vitamin D testing on patients on D2-supplementation and  patients for whom quantitation of D2 and D3 fractions is required, the  [facility] 25-OH VIT D, (D2,D3), LC/MS/MS is recommended: order code  [phone] > 2 yrs).   Resulting Agency  [facility]  Narrative   Performed at: [address]      [place], [place] [id number]  Specimen  Collected: 08/26/16 10:35 Last Resulted: 08/27/16 02:04                    TSH  Order: [id number]    Ref Range & Units 30m25moo 49yr749yr 72yr 24yr  TSH mIU/L 1.90  1.724R  3.462R   Comment:   Reference Range    > or = 20 Years 0.40-4.50    Pregnancy Range  First trimester 0.26-2.66  Second trimester 0.55-2.73  Third trimester 0.43-2.91     [facility]ing Agency  [facility]   Performed at: [address]   [id number] Collected: 08/26/16 10:35 Last Resulted: 08/26/16 23:46            R=Reference range differs from displayed range          T4, free  Order: [id number]:  Final result Visible to patient:  No (  Not Released) Next appt:  08/11/2018 at 02:00 PM in Cardiology ([facility]) Dx:  Benign hypertensive heart disease wit...       Ref Range & Units 51moago  Free T4 0.8 - 1.8 ng/dL 1.3   Resulting Agency  [facility]  Narrative   Performed at: [address]       [place], [place] [address]   Specimen Collected: 08/26/16 10:35 Last Resulted: 08/26/16 23:46

## 2016-09-30 NOTE — Assessment & Plan Note (Addendum)
Patient's vitamin D was 19 this last time when checked. We went from 1000 vitamin D daily to 5000.  After one mo---> pt just hurts less in hips.  Recheck in 4 months. If still not in the normal range, will add a weekly in addition to her daily.

## 2016-09-30 NOTE — Patient Instructions (Signed)
Please realize, EXERCISE IS MEDICINE!  -  American Heart Association [facility]) guidelines for exercise : If you are in good health, without any medical conditions, you should engage in 150 minutes of moderate intensity aerobic activity per week.  This means you should be huffing and puffing throughout your workout.   Engaging in regular exercise will improve brain function and memory, as well as improve mood, boost immune system and help with weight management.  As well as the other, more well-known effects of exercise such as decreasing blood sugar levels, decreasing blood pressure,  and decreasing bad cholesterol levels/ increasing good cholesterol levels.     -  The AHA strongly endorses consumption of a diet that contains a variety of foods from all the food categories with an emphasis on fruits and vegetables; fat-free and low-fat dairy products; cereal and grain products; legumes and nuts; and fish, poultry, and/or extra lean meats.    Excessive food intake, especially of foods high in saturated and trans fats, sugar, and salt, should be avoided.    Adequate water intake of roughly 1/2 of your weight in pounds, should equal the ounces of water per day you should drink.  So for instance, if you're 200 pounds, that would be 100 ounces of water per day.            Guidelines for a Low Cholesterol, Low Saturated Fat Diet   Fats - Limit total intake of fats and oils. - Avoid butter, stick margarine, shortening, lard, palm and coconut oils. - Limit mayonnaise, salad dressings, gravies and sauces, unless they are homemade with low-fat ingredients. - Limit chocolate. - Choose low-fat and nonfat products, such as low-fat mayonnaise, low-fat or non-hydrogenated peanut butter, low-fat or fat-free salad dressings and nonfat gravy. - Use vegetable oil, such as canola or olive oil. - Look for margarine that does not contain trans fatty acids. - Use nuts in moderate amounts. - Read ingredient labels  carefully to determine both amount and type of fat present in foods. Limit saturated and trans fats! - Avoid high-fat processed and convenience foods.  Meats and Meat Alternatives - Choose fish, chicken, [place] and lean meats. - Use dried beans, peas, lentils and tofu. - Limit egg yolks to three to four per week. - If you eat red meat, limit to no more than three servings per week and choose loin or round cuts. - Avoid fatty meats, such as bacon, sausage, franks, luncheon meats and ribs. - Avoid all organ meats, including liver.  Dairy - Choose nonfat or low-fat milk, yogurt and cottage cheese. - Most cheeses are high in fat. Choose cheeses made from non-fat milk, such as mozzarella and ricotta cheese. - Choose light or fat-free cream cheese and sour cream. - Avoid cream and sauces made with cream.  Fruits and Vegetables - Eat a wide variety of fruits and vegetables. - Use lemon juice, vinegar or "mist" olive oil on vegetables. - Avoid adding sauces, fat or oil to vegetables.  Breads, Cereals and Grains - Choose whole-grain breads, cereals, pastas and rice. - Avoid high-fat snack foods, such as granola, cookies, pies, pastries, doughnuts and croissants.  Cooking Tips - Avoid deep fried foods. - Trim visible fat off meats and remove skin from poultry before cooking. - Bake, broil, boil, poach or roast poultry, fish and lean meats. - Drain and discard fat that drains out of meat as you cook it. - Add little or no fat to foods. - Use vegetable oil  sprays to grease pans for cooking or baking. - Steam vegetables. - Use herbs or no-oil marinades to flavor foods.

## 2016-12-21 ENCOUNTER — Other Ambulatory Visit: Payer: Self-pay | Admitting: Interventional Cardiology

## 2017-01-06 ENCOUNTER — Other Ambulatory Visit: Payer: Self-pay | Admitting: Family Medicine

## 2017-01-06 DIAGNOSIS — Z1231 Encounter for screening mammogram for malignant neoplasm of breast: Secondary | ICD-10-CM

## 2017-01-25 ENCOUNTER — Other Ambulatory Visit: Payer: Self-pay | Admitting: Interventional Cardiology

## 2017-02-03 ENCOUNTER — Other Ambulatory Visit: Payer: Self-pay | Admitting: Interventional Cardiology

## 2017-02-03 ENCOUNTER — Other Ambulatory Visit (INDEPENDENT_AMBULATORY_CARE_PROVIDER_SITE_OTHER): Payer: Medicare Other

## 2017-02-03 DIAGNOSIS — E559 Vitamin D deficiency, unspecified: Secondary | ICD-10-CM

## 2017-02-03 DIAGNOSIS — M8580 Other specified disorders of bone density and structure, unspecified site: Secondary | ICD-10-CM | POA: Diagnosis not present

## 2017-02-03 NOTE — Progress Notes (Unsigned)
Impression and Recommendations:    1. Vitamin D deficiency   2. Moderate osteopenia     No problem-specific Assessment & Plan notes found for this encounter.    Education and routine counseling performed. Handouts provided.   No orders of the defined types were placed in this encounter.    Discontinued Medications   No medications on file      No orders of the defined types were placed in this encounter.    No Follow-up on file.  The patient was counseled, risk factors were discussed, anticipatory guidance given.  Gross side effects, risk and benefits, and alternatives of medications discussed with patient.  Patient is aware that all medications have potential side effects and we are unable to predict every side effect or drug-drug interaction that may occur.  Expresses verbal understanding and consents to current therapy plan and treatment regimen.  Please see AVS handed out to patient at the end of our visit for further patient instructions/ counseling done pertaining to today's office visit.    Note: This document was prepared using Dragon voice recognition software and may include unintentional dictation errors.     Subjective:    No chief complaint on file.   [name] is a 76 y.o. female who presents to [place] [facility] today for Diabetes Management.    DM HPI: -  She {Has/has not:[id number]} been working on diet and exercise for diabetes  Pt is currently maintained on the following medications for diabetes:   see med list today Medication compliance - ***  Home glucose readings range ***   Denies polyuria/polydipsia. Denies hypo/ hyperglycemia symptoms - She denies new onset of: chest pain, exercise intolerance, shortness of breath, dizziness, visual changes, headache, lower extremity swelling or claudication.   Complications:  polyneuropathy ***, proliferative retinopathy *** , Proteinuira ***  Last diabetic eye  exam was No results found for: HMDIABEYEEXA  Foot exam- UTD  Last A1C in the office was: No results found for: HGBA1C  Lab Results  Component Value Date   LDLCALC 76 08/26/2016   CREATININE 1.00 (H) 08/26/2016    Last 3 blood pressure readings in our office are as follows: BP Readings from Last 3 Encounters:  09/30/16 115/67  09/03/16 120/60  08/13/16 118/62    @BMILAST3 @    Patient Care Team    Relationship Specialty Notifications Start End  Mellody Dance, DO PCP - General Family Medicine  05/10/16   Jettie Booze, MD Consulting Physician Cardiology  02/14/16   Garald Balding, MD Consulting Physician Orthopedic Surgery  06/04/16   Martin[place], Amy, MD Consulting Physician Dermatology  06/04/16   Megan Salon, MD Consulting Physician Gynecology  06/04/16   Angelia Mould, MD Consulting Physician Vascular Surgery  06/07/16    Comment: follows carotids     Patient Active Problem List   Diagnosis Date Noted  . Bilateral carotid artery disease ([place]) 06/10/2012    Priority: High  . Vitamin D deficiency 09/30/2016  . Right carotid bruit 06/07/2016  . h/o Diverticulosis 06/07/2016  . GERD (gastroesophageal reflux disease) 06/07/2016  . Environmental and seasonal allergies 06/07/2016  . Arthritis 06/07/2016  . Moderate osteopenia 06/07/2016  . 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 Dyslipidemia 04/04/2011  . Benign hypertensive heart disease without heart failure 04/04/2011     Past Medical History:  Diagnosis Date  . Arthritis   . Back pain    had cortisone injection 6/15, Dr Durward Fortes  .  Carotid artery occlusion   . Carotid bruit   . Diverticulosis   . GERD (gastroesophageal reflux disease)   . Hyperlipidemia   . Hypertension   . Injury of left leg 06/05/12   Pt. fell  . Peripheral vascular disease ([place])   . Thyroid disease 02-27-12   lt. thyroid nodule, bx. done 5 yrs ago-now some enlargement is seen.     Past Surgical History:    Procedure Laterality Date  . ABDOMINAL HYSTERECTOMY  01/1980  . CAROTID ENDARTERECTOMY  1998   Stable since surgery -slight build up returning-follows with yrly Dopplers  . CATARACT EXTRACTION, BILATERAL  2004 or 2005 per pt  . COLONOSCOPY    . EYE SURGERY  02-27-12   Bil. Cataract surgery  . THYROID LOBECTOMY  03/02/2012   Procedure: THYROID LOBECTOMY;  Surgeon: Earnstine Regal, MD;  Location: [facility];  Service: General;  Laterality: Left;     Family History  Problem Relation Age of Onset  . Heart disease Mother   . Hypertension Mother   . Arthritis Mother   . Stroke Mother   . Hyperlipidemia Mother   . Atrial fibrillation Mother   . Heart disease Brother        before age 1  . Heart attack Neg Hx      History  Drug Use No  ,  History  Alcohol Use  . 0.0 oz/week  . 1 - 2 Glasses of wine per week    Comment: 1-2 drinks a week  ,  History  Smoking Status  . Former Smoker  . Quit date: 09/24/1975  Smokeless Tobacco  . Never Used  ,    Current Outpatient Prescriptions on File Prior to Visit  Medication Sig Dispense Refill  . acetaminophen (TYLENOL) 500 MG tablet Take 1,000 mg by mouth 2 (two) times daily. For arthritis pain    . amLODipine (NORVASC) 2.5 MG tablet TAKE 1 TABLET BY MOUTH DAILY WITH BREAKFAST 90 tablet 1  . aspirin 81 MG EC tablet Take 162 mg by mouth daily with breakfast. Taking 2 daily    . B Complex Vitamins (VITAMIN-B COMPLEX PO) Take 1 tablet by mouth daily with breakfast.     . CALCIUM PO Take 600 mg by mouth 3 (three) times a week. 600mg     . Cholecalciferol (VITAMIN D3) 5000 units CAPS Take by mouth daily.    . furosemide (LASIX) 40 MG tablet Take 40 mg by mouth as needed. For edema    . hydrochlorothiazide (HYDRODIURIL) 25 MG tablet TAKE 1 TABLET BY MOUTH DAILY 30 tablet 5  . KRILL OIL PO Take 500 mg by mouth daily. MEGA RED    . levothyroxine (SYNTHROID, LEVOTHROID) 112 MCG tablet Take 1 tablet (112 mcg total) by mouth daily. 90 tablet 4  .  Loratadine (CLARITIN) 10 MG CAPS Take 10 mg by mouth as needed (allergies). As needed    . metoprolol succinate (TOPROL-XL) 100 MG 24 hr tablet Take 1 tablet (100 mg total) by mouth daily with breakfast. Take with or immediately following a meal. 90 tablet 3  . Multiple Vitamin (MULTIVITAMIN) tablet Take 1 tablet by mouth daily.      . ranitidine (ZANTAC) 150 MG tablet Take 150 mg by mouth as needed for heartburn.    . rosuvastatin (CRESTOR) 20 MG tablet Take 1 tablet (20 mg total) by mouth daily with breakfast. 90 tablet 1  . telmisartan (MICARDIS) 80 MG tablet TAKE 1 TABLET BY MOUTH DAILY 30 tablet  5  . verapamil (CALAN-SR) 240 MG CR tablet TAKE 1 TABLET BY MOUTH DAILY WITH BREAKFAST 30 tablet 11   No current facility-administered medications on file prior to visit.      Allergies  Allergen Reactions  . Zebeta Other (See Comments)    Does not work, per patient.  . Ace Inhibitors Other (See Comments)    cough     Review of Systems:   General:  Denies fever, chills Optho/Auditory:   Denies visual changes, blurred vision Respiratory:   Denies SOB, cough, wheeze, DIB  Cardiovascular:   Denies chest pain, palpitations, painful respirations Gastrointestinal:   Denies nausea, vomiting, diarrhea.  Endocrine:     Denies new hot or cold intolerance Musculoskeletal:  Denies joint swelling, gait issues, or new unexplained myalgias/ arthralgias Skin:  Denies rash, suspicious lesions  Neurological:    Denies dizziness, unexplained weakness, numbness  Psychiatric/Behavioral:   Denies mood changes    Objective:     Last menstrual period 09/24/1979.  There is no height or weight on file to calculate BMI.  General: Well Developed, well nourished, and in no acute distress.  HEENT: Normocephalic, atraumatic, pupils equal round reactive to light, neck supple, No carotid bruits, no JVD Skin: Warm and dry, cap RF less 2 sec Cardiac: Regular rate and rhythm, S1, S2 WNL's, no murmurs rubs or  gallops Respiratory: ECTA B/L, Not using accessory muscles, speaking in full sentences. NeuroM-Sk: Ambulates w/o assistance, moves ext * 4 w/o difficulty, sensation grossly intact.  Ext: scant edema b/l lower ext Psych: No HI/SI, judgement and insight good, Euthymic mood. Full Affect.

## 2017-02-04 ENCOUNTER — Encounter: Payer: Self-pay | Admitting: Family

## 2017-02-04 LAB — VITAMIN D 25 HYDROXY (VIT D DEFICIENCY, FRACTURES): Vit D, 25-Hydroxy: 38.6 ng/mL (ref 30.0–100.0)

## 2017-02-06 ENCOUNTER — Telehealth: Payer: Self-pay

## 2017-02-06 NOTE — Telephone Encounter (Signed)
Informed patient of lab results and recommendations. °

## 2017-02-12 ENCOUNTER — Ambulatory Visit (INDEPENDENT_AMBULATORY_CARE_PROVIDER_SITE_OTHER): Payer: Medicare Other | Admitting: Family

## 2017-02-12 ENCOUNTER — Ambulatory Visit: Payer: Medicare Other | Admitting: Family

## 2017-02-12 ENCOUNTER — Ambulatory Visit (HOSPITAL_COMMUNITY)
Admission: RE | Admit: 2017-02-12 | Discharge: 2017-02-12 | Disposition: A | Discharge status: Home / Self Care | Payer: Medicare Other | Source: Ambulatory Visit | Attending: Vascular Surgery | Admitting: Vascular Surgery

## 2017-02-12 ENCOUNTER — Encounter: Payer: Self-pay | Admitting: Family

## 2017-02-12 ENCOUNTER — Encounter (HOSPITAL_COMMUNITY): Payer: Medicare Other

## 2017-02-12 VITALS — BP 133/64 | HR 73 | Temp 97.1°F | Resp 20 | Ht 65.25 in | Wt 176.0 lb

## 2017-02-12 DIAGNOSIS — I6523 Occlusion and stenosis of bilateral carotid arteries: Secondary | ICD-10-CM

## 2017-02-12 DIAGNOSIS — Z9889 Other specified postprocedural states: Secondary | ICD-10-CM

## 2017-02-12 LAB — VAS US CAROTID
LCCADDIAS: -25 cm/s
LEFT ECA DIAS: -30 cm/s
LEFT VERTEBRAL DIAS: -14 cm/s
LICADSYS: -106 cm/s
Left CCA dist sys: -121 cm/s
Left CCA prox dias: 20 cm/s
Left CCA prox sys: 109 cm/s
Left ICA dist dias: -26 cm/s
Left ICA prox dias: -41 cm/s
Left ICA prox sys: -262 cm/s
RCCAPDIAS: 10 cm/s
RCCAPSYS: 85 cm/s
RIGHT CCA MID DIAS: 11 cm/s
RIGHT ECA DIAS: -35 cm/s
RIGHT VERTEBRAL DIAS: 10 cm/s

## 2017-02-12 NOTE — Addendum Note (Signed)
Addended by: Lianne Cure A on: 02/12/2017 04:24 PM   Modules accepted: Orders

## 2017-02-12 NOTE — Patient Instructions (Signed)
Stroke Prevention Some medical conditions and behaviors are associated with an increased chance of having a stroke. You may prevent a stroke by making healthy choices and managing medical conditions. How can I reduce my risk of having a stroke?  Stay physically active. Get at least 30 minutes of activity on most or all days.  Do not smoke. It may also be helpful to avoid exposure to secondhand smoke.  Limit alcohol use. Moderate alcohol use is considered to be:  No more than 2 drinks per day for men.  No more than 1 drink per day for nonpregnant women.  Eat healthy foods. This involves:  Eating 5 or more servings of fruits and vegetables a day.  Making dietary changes that address high blood pressure (hypertension), high cholesterol, diabetes, or obesity.  Manage your cholesterol levels.  Making food choices that are high in fiber and low in saturated fat, trans fat, and cholesterol may control cholesterol levels.  Take any prescribed medicines to control cholesterol as directed by your health care provider.  Manage your diabetes.  Controlling your carbohydrate and sugar intake is recommended to manage diabetes.  Take any prescribed medicines to control diabetes as directed by your health care provider.  Control your hypertension.  Making food choices that are low in salt (sodium), saturated fat, trans fat, and cholesterol is recommended to manage hypertension.  Ask your health care provider if you need treatment to lower your blood pressure. Take any prescribed medicines to control hypertension as directed by your health care provider.  If you are 18-39 years of age, have your blood pressure checked every 3-5 years. If you are 40 years of age or older, have your blood pressure checked every year.  Maintain a healthy weight.  Reducing calorie intake and making food choices that are low in sodium, saturated fat, trans fat, and cholesterol are recommended to manage  weight.  Stop drug abuse.  Avoid taking birth control pills.  Talk to your health care provider about the risks of taking birth control pills if you are over 35 years old, smoke, get migraines, or have ever had a blood clot.  Get evaluated for sleep disorders (sleep apnea).  Talk to your health care provider about getting a sleep evaluation if you snore a lot or have excessive sleepiness.  Take medicines only as directed by your health care provider.  For some people, aspirin or blood thinners (anticoagulants) are helpful in reducing the risk of forming abnormal blood clots that can lead to stroke. If you have the irregular heart rhythm of atrial fibrillation, you should be on a blood thinner unless there is a good reason you cannot take them.  Understand all your medicine instructions.  Make sure that other conditions (such as anemia or atherosclerosis) are addressed. Get help right away if:  You have sudden weakness or numbness of the face, arm, or leg, especially on one side of the body.  Your face or eyelid droops to one side.  You have sudden confusion.  You have trouble speaking (aphasia) or understanding.  You have sudden trouble seeing in one or both eyes.  You have sudden trouble walking.  You have dizziness.  You have a loss of balance or coordination.  You have a sudden, severe headache with no known cause.  You have new chest pain or an irregular heartbeat. Any of these symptoms may represent a serious problem that is an emergency. Do not wait to see if the symptoms will go away.   Get medical help at once. Call your local emergency services (911 in [place] Do not drive yourself to the hospital. This information is not intended to replace advice given to you by your health care provider. Make sure you discuss any questions you have with your health care provider. Document Released: 10/17/2004 Document Revised: 02/15/2016 Document Reviewed: 03/12/2013 Elsevier  Interactive Patient Education  2017 Elsevier Inc.  

## 2017-02-12 NOTE — Progress Notes (Signed)
Chief Complaint: Follow up Extracranial Carotid Artery Stenosis   History of Present Illness  [name] is a 76 y.o. female who is s/p right carotid endarterectomy in 1998 by Dr. Amedeo Plenty; Dr. Scot Dock has been monitoring pt since Dr. Amedeo Plenty left.   On 02/08/2015 duplex exam she had a patent right carotid endarterectomy site with a 50-69% recurrent stenosis. On the left side it was a less than 49% stenosis. However there was stenosis in the distal common carotid artery on the right greater than 50%. She was set up for a 1 year follow up visit.  She denies any known history of stroke or TIA. Specifically she denies a history of amaurosis fugax or monocular blindness, unilateral facial drooping, hemiplegia, or receptive or expressive aphasia.    She denies claudication symptoms with walking. She does report intermittent right hip pain. She also reports a hx of one ESI that resolved her low back pain.   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   Pt Diabetic: no Pt smoker: former smoker, quit in 1977, started smoking at age 20 years   Pt meds include: Statin : yes ASA: yes, 172 mg daily Other anticoagulants/antiplatelets: no   Past Medical History:  Diagnosis Date  . Arthritis   . Back pain    had cortisone injection 6/15, Dr Durward Fortes  . Carotid artery occlusion   . Carotid bruit   . Diverticulosis   . GERD (gastroesophageal reflux disease)   . Hyperlipidemia   . Hypertension   . Injury of left leg 06/05/12   Pt. fell  . Peripheral vascular disease ([place])   . Thyroid disease 02-27-12   lt. thyroid nodule, bx. done 5 yrs ago-now some enlargement is seen.    Social History Social History  Substance Use Topics  . Smoking status: Former Smoker    Quit date: 09/24/1975  . Smokeless tobacco: Never Used  . Alcohol use 0.0 oz/week    1 - 2 Glasses of wine per week     Comment: 1-2 drinks a month    Family History Family History  Problem Relation Age of Onset  .  Heart disease Mother   . Hypertension Mother   . Arthritis Mother   . Stroke Mother   . Hyperlipidemia Mother   . Atrial fibrillation Mother   . Heart disease Brother        before age 70  . Heart attack Neg Hx     Surgical History Past Surgical History:  Procedure Laterality Date  . ABDOMINAL HYSTERECTOMY  01/1980  . CAROTID ENDARTERECTOMY  1998   Stable since surgery -slight build up returning-follows with yrly Dopplers  . CATARACT EXTRACTION, BILATERAL  2004 or 2005 per pt  . COLONOSCOPY    . EYE SURGERY  02-27-12   Bil. Cataract surgery  . THYROID LOBECTOMY  03/02/2012   Procedure: THYROID LOBECTOMY;  Surgeon: Earnstine Regal, MD;  Location: [facility];  Service: General;  Laterality: Left;    Allergies  Allergen Reactions  . Zebeta Other (See Comments)    Does not work, per patient.  . Ace Inhibitors Other (See Comments)    cough    Current Outpatient Prescriptions  Medication Sig Dispense Refill  . acetaminophen (TYLENOL) 500 MG tablet Take 1,000 mg by mouth 2 (two) times daily. For arthritis pain    . amLODipine (NORVASC) 2.5 MG tablet TAKE 1 TABLET BY MOUTH DAILY WITH BREAKFAST 90 tablet 1  . aspirin 81 MG  EC tablet Take 162 mg by mouth daily with breakfast. Taking 2 daily    . B Complex Vitamins (VITAMIN-B COMPLEX PO) Take 1 tablet by mouth daily with breakfast.     . CALCIUM PO Take 600 mg by mouth 3 (three) times a week. 600mg     . Cholecalciferol (VITAMIN D3) 5000 units CAPS Take by mouth daily.    Marland Kitchen docusate sodium (COLACE) 100 MG capsule Take 100 mg by mouth 2 (two) times daily.    . furosemide (LASIX) 40 MG tablet Take 40 mg by mouth as needed. For edema    . hydrochlorothiazide (HYDRODIURIL) 25 MG tablet TAKE 1 TABLET BY MOUTH DAILY 30 tablet 5  . KRILL OIL PO Take 500 mg by mouth daily. MEGA RED    . levothyroxine (SYNTHROID, LEVOTHROID) 112 MCG tablet Take 1 tablet (112 mcg total) by mouth daily. 90 tablet 4  . Loratadine (CLARITIN) 10 MG CAPS Take 10 mg by mouth  as needed (allergies). As needed    . metoprolol succinate (TOPROL-XL) 100 MG 24 hr tablet Take 1 tablet (100 mg total) by mouth daily with breakfast. Take with or immediately following a meal. 90 tablet 3  . Multiple Vitamin (MULTIVITAMIN) tablet Take 1 tablet by mouth daily.      . ranitidine (ZANTAC) 150 MG tablet Take 150 mg by mouth as needed for heartburn.    . rosuvastatin (CRESTOR) 20 MG tablet TAKE 1 TABLET BY MOUTH DAILY WITH BREAKFAST 90 tablet 1  . telmisartan (MICARDIS) 80 MG tablet TAKE 1 TABLET BY MOUTH DAILY 30 tablet 5  . verapamil (CALAN-SR) 240 MG CR tablet TAKE 1 TABLET BY MOUTH DAILY WITH BREAKFAST 30 tablet 11   No current facility-administered medications for this visit.     Review of Systems : See HPI for pertinent positives and negatives.  Physical Examination  Vitals:   02/12/17 1402  BP: 133/64  Pulse: 73  Resp: 20  Temp: 97.1 F (36.2 C)  TempSrc: Oral  SpO2: 94%  Weight: 176 lb (79.8 kg)  Height: 5' 5.25" (1.657 m)   Body mass index is 29.06 kg/m.  General: WDWN female in NAD GAIT: normal Eyes: PERRLA Pulmonary:  Respirations are non-labored, good air movement, CTAB, no rales, rhonchi, or wheezing.  Cardiac: regular rhythm, no detected murmur.  VASCULAR EXAM Carotid Bruits Right Left   Positive Negative    Aorta is not palpable. Radial pulses are 2+ palpable and equal.                                                                                                                            LE Pulses Right Left       POPLITEAL  not palpable   not palpable       POSTERIOR TIBIAL   palpable    palpable        DORSALIS PEDIS      ANTERIOR TIBIAL  palpable   palpable  Gastrointestinal: soft, nontender, BS WNL, no r/g, no palpable masses.  Musculoskeletal: No muscle atrophy/wasting. M/S 5/5 throughout, extremities without ischemic changes.  Neurologic: A&O X 3; Appropriate Affect, Speech is normal, CN 2-12 intact, pain and light  touch intact in extremities, Motor exam as listed above.     Assessment: [name] is a 76 y.o. female who is s/p right carotid endarterectomy in 1998. She has no history of stroke or TIA.   Fortunately she does not have DM and quit smoking in 1977, smoked about 18 years. She takes a daily ASA and a statin.  DATA (02/12/17):  Carotid Duplex: Right CEA site with 40-59% stenosis of the ICA/bifurcation segment.  Left ICA: 40-59% stenosis. Bilateral ECA stenosis.  Bilateral vertebral artery flow is antegrade.  Bilateral subclavian artery waveforms are normal.  No significant change compared to the last exam on 02-14-16.    Plan: Follow-up in 18 months with Carotid Duplex scan.   I discussed in depth with the patient the nature of atherosclerosis, and emphasized the importance of maximal medical management including strict control of blood pressure, blood glucose, and lipid levels, obtaining regular exercise, and continued cessation of smoking.  The patient is aware that without maximal medical management the underlying atherosclerotic disease process will progress, limiting the benefit of any interventions. The patient was given information about stroke prevention and what symptoms should prompt the patient to seek immediate medical care. Thank you for allowing [place] to participate in this patient's care.  Clemon Chambers, RN, MSN, FNP-C [facility] Office: [phone]  Clinic Physician: Scot Dock  02/12/17 2:10 PM

## 2017-02-17 NOTE — Progress Notes (Signed)
Patient ID: [name], female   DOB: 1941/07/08, 76 y.o.   MRN: [id number]     Cardiology Office Note   Date:  02/19/2017   ID:  [name], DOB [birth year], MRN [id number]  PCP:  Mellody Dance, DO    Chief Complaint  Patient presents with  . Follow-up  f/u PVD   Wt Readings from Last 3 Encounters:  02/19/17 177 lb 6.4 oz (80.5 kg)  02/12/17 176 lb (79.8 kg)  09/30/16 176 lb (79.8 kg)       History of Present Illness: [name] is a 76 y.o. female  has a past history of dyslipidemia.  She was previously followed by Dr. Mare Ferrari.  She has a history of known residual right carotid bruit after remote right carotid endarterectomy.  She does not have any history of ischemic heart disease. She had a normal nuclear stress test in 2005. She has had essential hypertension and obesity.   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 She is under more stress. Home health has been coming in. She also has home instead coming in.  Mrs. [name] does not get much intentional exercise but stays busy as the caregiver for her husband. She does have a treadmill out in the basement that she intends to start using.   Her thyroid disease is followed by her GYN physician. She follows with vascular surgery regarding her carotid disease.   Since the last visit, she Denies : Chest pain. Dizziness. Leg edema. Nitroglycerin use. Orthopnea. Palpitations. Paroxysmal nocturnal dyspnea. Shortness of breath. Syncope.  Joint pains are not bad enough that she needs an injection at this time. She still has not started  A regular walking program.    Past Medical History:  Diagnosis Date  . Arthritis   . Back pain    had cortisone injection 6/15, Dr Durward Fortes  . Carotid artery occlusion   . Carotid bruit   . Diverticulosis   . GERD (gastroesophageal reflux disease)   . Hyperlipidemia   . Hypertension   . Injury of left  leg 06/05/12   Pt. fell  . Peripheral vascular disease ([place])   . Thyroid disease 02-27-12   lt. thyroid nodule, bx. done 5 yrs ago-now some enlargement is seen.    Past Surgical History:  Procedure Laterality Date  . ABDOMINAL HYSTERECTOMY  01/1980  . CAROTID ENDARTERECTOMY  1998   Stable since surgery -slight build up returning-follows with yrly Dopplers  . CATARACT EXTRACTION, BILATERAL  2004 or 2005 per pt  . COLONOSCOPY    . EYE SURGERY  02-27-12   Bil. Cataract surgery  . THYROID LOBECTOMY  03/02/2012   Procedure: THYROID LOBECTOMY;  Surgeon: Earnstine Regal, MD;  Location: [facility];  Service: General;  Laterality: Left;     Current Outpatient Prescriptions  Medication Sig Dispense Refill  . acetaminophen (TYLENOL) 500 MG tablet Take 1,000 mg by mouth 2 (two) times daily. For arthritis pain    . amLODipine (NORVASC) 2.5 MG tablet TAKE 1 TABLET BY MOUTH DAILY WITH BREAKFAST 90 tablet 1  . aspirin 81 MG EC tablet Take 162 mg by mouth daily with breakfast. Taking 2 daily    . B Complex Vitamins (VITAMIN-B COMPLEX PO) Take 1 tablet by mouth daily with breakfast.     . CALCIUM PO Take 600 mg by mouth 3 (three) times a week. 600mg     . Cholecalciferol (VITAMIN D3)  5000 units CAPS Take 1 capsule by mouth daily.     Marland Kitchen docusate sodium (COLACE) 100 MG capsule Take 100 mg by mouth 2 (two) times daily.    . furosemide (LASIX) 40 MG tablet Take 40 mg by mouth as needed. For edema    . hydrochlorothiazide (HYDRODIURIL) 25 MG tablet TAKE 1 TABLET BY MOUTH DAILY 30 tablet 5  . KRILL OIL PO Take 500 mg by mouth daily. MEGA RED    . levothyroxine (SYNTHROID, LEVOTHROID) 112 MCG tablet Take 1 tablet (112 mcg total) by mouth daily. 90 tablet 4  . Loratadine (CLARITIN) 10 MG CAPS Take 10 mg by mouth as needed (allergies). As needed    . metoprolol succinate (TOPROL-XL) 100 MG 24 hr tablet Take 1 tablet (100 mg total) by mouth daily with breakfast. Take with or immediately following a meal. 90 tablet 3  .  Multiple Vitamin (MULTIVITAMIN) tablet Take 1 tablet by mouth daily.      . ranitidine (ZANTAC) 150 MG tablet Take 150 mg by mouth as needed for heartburn.    . rosuvastatin (CRESTOR) 20 MG tablet TAKE 1 TABLET BY MOUTH DAILY WITH BREAKFAST 90 tablet 1  . telmisartan (MICARDIS) 80 MG tablet TAKE 1 TABLET BY MOUTH DAILY 30 tablet 5  . verapamil (CALAN-SR) 240 MG CR tablet TAKE 1 TABLET BY MOUTH DAILY WITH BREAKFAST 30 tablet 11   No current facility-administered medications for this visit.     Allergies:   Zebeta and Ace inhibitors    Social History:  The patient  reports that she quit smoking about 41 years ago. She has never used smokeless tobacco. She reports that she drinks alcohol. She reports that she does not use drugs.   Family History:  The patient's family history includes Arthritis in her mother; Atrial fibrillation in her mother; Heart disease in her brother and mother; Hyperlipidemia in her mother; Hypertension in her mother; Stroke in her mother.    ROS:  Please see the history of present illness.   Otherwise, review of systems are positive for occasional back and hip pain.   All other systems are reviewed and negative.    PHYSICAL EXAM: VS:  BP 120/60   Pulse 64   Ht 5\' 3"  (1.6 m)   Wt 177 lb 6.4 oz (80.5 kg)   LMP 09/24/1979   BMI 31.42 kg/m  , BMI Body mass index is 31.42 kg/m. GEN: Well nourished, well developed, in no acute distress  HEENT: normal  Neck: no JVD,; bilateral  carotid bruits, no masses Cardiac: RRR; no murmurs, rubs, or gallops,no edema  Respiratory:  clear to auscultation bilaterally, normal work of breathing GI: soft, nontender, nondistended,  MS: no deformity or atrophy  Skin: warm and dry, no rash Neuro:  Strength and sensation are intact Psych: euthymic mood, full affect     EKG:   The ekg ordered today demonstrates NSR, no ST segment changes   Recent Labs: 08/26/2016: ALT 19; BUN 23; Creat 1.00; Hemoglobin 12.1; Platelets 307;  Potassium 4.7; Sodium 139; TSH 1.90   Lipid Panel    Component Value Date/Time   CHOL 145 08/26/2016 1035   TRIG 158 (H) 08/26/2016 1035   HDL 37 (L) 08/26/2016 1035   CHOLHDL 3.9 08/26/2016 1035   VLDL 32 (H) 08/26/2016 1035   LDLCALC 76 08/26/2016 1035   LDLDIRECT 73.4 01/07/2013 0939     Other studies Reviewed: Additional studies/ records that were reviewed today with results demonstrating: elevated TG in  2017.   ASSESSMENT AND PLAN:  1. Hypertensive heart disease without heart failure: BP controlled today. COntinue current meds.  Plan for 3 month supply.   2. Hypercholesterolemia:  Recheck today.  Triglycerides slightly elevated in December 2017.  Continue healthy diet and lipid-lowering therapy. I encouraged her to try to exercise more as noted below. 3. asymptomatic right carotid bruit followed by vascular surgeons: prior CEA.  FOllows with Dr. Scot Dock.  She had a Doppler recently which was stable.  Hypothyroid: followed by GYN.    Current medicines are reviewed at length with the patient today.  The patient concerns regarding her medicines were addressed.  The following changes have been made:  No change  Labs/ tests ordered today include:   No orders of the defined types were placed in this encounter.   Recommend 150 minutes/week of aerobic exercise Low fat, low carb, high fiber diet recommended  Disposition:   FU in 6 months   Signed, Larae Grooms, MD  02/19/2017 11:25 AM    [facility] [address], [place], [place]  [address] Phone: [phone]; Fax: [phone]

## 2017-02-18 ENCOUNTER — Ambulatory Visit
Admission: RE | Admit: 2017-02-18 | Discharge: 2017-02-18 | Disposition: A | Discharge status: Home / Self Care | Payer: Medicare Other | Source: Ambulatory Visit | Attending: Family Medicine | Admitting: Family Medicine

## 2017-02-18 DIAGNOSIS — Z1231 Encounter for screening mammogram for malignant neoplasm of breast: Secondary | ICD-10-CM | POA: Diagnosis not present

## 2017-02-19 ENCOUNTER — Other Ambulatory Visit: Payer: Self-pay | Admitting: Family Medicine

## 2017-02-19 ENCOUNTER — Other Ambulatory Visit: Payer: Self-pay

## 2017-02-19 ENCOUNTER — Encounter: Payer: Self-pay | Admitting: Interventional Cardiology

## 2017-02-19 ENCOUNTER — Ambulatory Visit (INDEPENDENT_AMBULATORY_CARE_PROVIDER_SITE_OTHER): Payer: Medicare Other | Admitting: Interventional Cardiology

## 2017-02-19 VITALS — BP 120/60 | HR 64 | Ht 63.0 in | Wt 177.4 lb

## 2017-02-19 DIAGNOSIS — R928 Other abnormal and inconclusive findings on diagnostic imaging of breast: Secondary | ICD-10-CM

## 2017-02-19 DIAGNOSIS — I119 Hypertensive heart disease without heart failure: Secondary | ICD-10-CM | POA: Diagnosis not present

## 2017-02-19 DIAGNOSIS — E785 Hyperlipidemia, unspecified: Secondary | ICD-10-CM

## 2017-02-19 DIAGNOSIS — I739 Peripheral vascular disease, unspecified: Secondary | ICD-10-CM

## 2017-02-19 DIAGNOSIS — I779 Disorder of arteries and arterioles, unspecified: Secondary | ICD-10-CM

## 2017-02-19 MED ORDER — METOPROLOL SUCCINATE ER 100 MG PO TB24: 100.0000 mg | ORAL_TABLET | Freq: Every day | ORAL | 3 refills | Status: DC

## 2017-02-19 MED ORDER — FUROSEMIDE 40 MG PO TABS: 40.0000 mg | ORAL_TABLET | ORAL | 3 refills | Status: DC | PRN

## 2017-02-19 MED ORDER — HYDROCHLOROTHIAZIDE 25 MG PO TABS
25.0000 mg | ORAL_TABLET | Freq: Every day | ORAL | 3 refills | Status: DC
Start: 2017-02-19 — End: 2018-02-11

## 2017-02-19 MED ORDER — AMLODIPINE BESYLATE 2.5 MG PO TABS: 2.5000 mg | ORAL_TABLET | Freq: Every day | ORAL | 3 refills | Status: DC

## 2017-02-19 MED ORDER — TELMISARTAN 80 MG PO TABS: 80.0000 mg | ORAL_TABLET | Freq: Every day | ORAL | 3 refills | Status: DC

## 2017-02-19 MED ORDER — VERAPAMIL HCL ER 240 MG PO TBCR: 240.0000 mg | EXTENDED_RELEASE_TABLET | Freq: Every day | ORAL | 3 refills | Status: DC

## 2017-02-19 MED ORDER — ROSUVASTATIN CALCIUM 20 MG PO TABS: 20.0000 mg | ORAL_TABLET | Freq: Every day | ORAL | 3 refills | Status: DC

## 2017-02-19 NOTE — Patient Instructions (Addendum)
Medication Instructions:  Your physician recommends that you continue on your current medications as directed. Please refer to the Current Medication list given to you today.   Labwork: LABS TODAY: CMET, LIPIDS  Testing/Procedures: None ordered  Follow-Up: Your physician wants you to follow-up in: 6 months with Dr. Irish Lack. You will receive a reminder letter in the mail two months in advance. If you don't receive a letter, please call our office to schedule the follow-up appointment.   Any Other Special Instructions Will Be Listed Below (If Applicable).     If you need a refill on your cardiac medications before your next appointment, please call your pharmacy.

## 2017-02-20 ENCOUNTER — Telehealth: Payer: Self-pay

## 2017-02-20 LAB — COMPREHENSIVE METABOLIC PANEL
ALBUMIN: 4.5 g/dL (ref 3.5–4.8)
ALT: 20 IU/L (ref 0–32)
AST: 20 IU/L (ref 0–40)
Albumin/Globulin Ratio: 1.8 (ref 1.2–2.2)
Alkaline Phosphatase: 63 IU/L (ref 39–117)
BILIRUBIN TOTAL: 0.4 mg/dL (ref 0.0–1.2)
BUN / CREAT RATIO: 25 (ref 12–28)
BUN: 21 mg/dL (ref 8–27)
CALCIUM: 9.7 mg/dL (ref 8.7–10.3)
CHLORIDE: 104 mmol/L (ref 96–106)
CO2: 23 mmol/L (ref 18–29)
CREATININE: 0.83 mg/dL (ref 0.57–1.00)
GFR, EST AFRICAN AMERICAN: 79 mL/min/{1.73_m2} (ref >59)
GFR, EST NON AFRICAN AMERICAN: 68 mL/min/{1.73_m2} (ref >59)
GLUCOSE: 92 mg/dL (ref 65–99)
Globulin, Total: 2.5 g/dL (ref 1.5–4.5)
Potassium: 4.5 mmol/L (ref 3.5–5.2)
Sodium: 141 mmol/L (ref 134–144)
TOTAL PROTEIN: 7 g/dL (ref 6.0–8.5)

## 2017-02-20 LAB — LIPID PANEL
Chol/HDL Ratio: 4.2 ratio (ref 0.0–4.4)
Cholesterol, Total: 159 mg/dL (ref 100–199)
HDL: 38 mg/dL — AB (ref >39)
LDL CALC: 74 mg/dL (ref 0–99)
Triglycerides: 236 mg/dL — ABNORMAL HIGH (ref 0–149)
VLDL CHOLESTEROL CAL: 47 mg/dL — AB (ref 5–40)

## 2017-02-20 NOTE — Telephone Encounter (Signed)
-----   Message from Mellody Dance, DO sent at 02/19/2017  5:18 PM EDT ----- Please call patient and make sure she got the message of these results as it states they have contacted her in the result note.     - Did they schedule any further imaging question mark

## 2017-02-20 NOTE — Telephone Encounter (Signed)
Patient aware of mammogram results and recommendations.  Follow up scheduled.

## 2017-02-21 ENCOUNTER — Ambulatory Visit: Payer: Medicare Other | Admitting: Interventional Cardiology

## 2017-02-21 ENCOUNTER — Ambulatory Visit
Admission: RE | Admit: 2017-02-21 | Discharge: 2017-02-21 | Disposition: A | Discharge status: Home / Self Care | Payer: Medicare Other | Source: Ambulatory Visit | Attending: Family Medicine | Admitting: Family Medicine

## 2017-02-21 DIAGNOSIS — R928 Other abnormal and inconclusive findings on diagnostic imaging of breast: Secondary | ICD-10-CM

## 2017-02-21 DIAGNOSIS — R922 Inconclusive mammogram: Secondary | ICD-10-CM | POA: Diagnosis not present

## 2017-03-08 NOTE — Assessment & Plan Note (Signed)
F/up with Vasc regularly

## 2017-03-08 NOTE — Assessment & Plan Note (Signed)
tsh stable, pt Asx

## 2017-03-08 NOTE — Assessment & Plan Note (Signed)

## 2017-03-08 NOTE — Assessment & Plan Note (Signed)
Diet and exercise counseling done in addition to meds by Cards  Handout given to pt

## 2017-03-08 NOTE — Assessment & Plan Note (Signed)
Well controlled at 115/67  meds per cards, follows up w Vasc as well

## 2017-03-17 DIAGNOSIS — D225 Melanocytic nevi of trunk: Secondary | ICD-10-CM | POA: Diagnosis not present

## 2017-03-17 DIAGNOSIS — L245 Irritant contact dermatitis due to other chemical products: Secondary | ICD-10-CM | POA: Diagnosis not present

## 2017-03-17 DIAGNOSIS — L821 Other seborrheic keratosis: Secondary | ICD-10-CM | POA: Diagnosis not present

## 2017-05-30 ENCOUNTER — Telehealth: Payer: Self-pay | Admitting: Family Medicine

## 2017-05-30 NOTE — Telephone Encounter (Signed)
Paperwork is up front for patient to pick up.  MPulliam, CMA/RT(R)

## 2017-05-30 NOTE — Telephone Encounter (Signed)
Ms. Freeburg states Wed, Sept 5th she gave Dorothea Ogle her husband's Handicap renewal form to have signed by Doctor-- coming this afternoon to pick up-- frwding note to MA for status. --glh

## 2017-06-23 ENCOUNTER — Ambulatory Visit: Payer: Medicare Other | Admitting: Family Medicine

## 2017-06-24 ENCOUNTER — Ambulatory Visit (INDEPENDENT_AMBULATORY_CARE_PROVIDER_SITE_OTHER): Payer: Medicare Other | Admitting: Family Medicine

## 2017-06-24 ENCOUNTER — Encounter: Payer: Self-pay | Admitting: Family Medicine

## 2017-06-24 VITALS — BP 124/70 | HR 63 | Temp 97.7°F | Ht 63.0 in | Wt 175.9 lb

## 2017-06-24 DIAGNOSIS — R12 Heartburn: Secondary | ICD-10-CM | POA: Diagnosis not present

## 2017-06-24 DIAGNOSIS — K219 Gastro-esophageal reflux disease without esophagitis: Secondary | ICD-10-CM

## 2017-06-24 DIAGNOSIS — R1013 Epigastric pain: Secondary | ICD-10-CM | POA: Diagnosis not present

## 2017-06-24 DIAGNOSIS — Z23 Encounter for immunization: Secondary | ICD-10-CM

## 2017-06-24 DIAGNOSIS — I119 Hypertensive heart disease without heart failure: Secondary | ICD-10-CM

## 2017-06-24 DIAGNOSIS — E669 Obesity, unspecified: Secondary | ICD-10-CM

## 2017-06-24 DIAGNOSIS — R142 Eructation: Secondary | ICD-10-CM

## 2017-06-24 MED ORDER — OMEPRAZOLE 20 MG PO CPDR: 20.0000 mg | DELAYED_RELEASE_CAPSULE | Freq: Every day | ORAL | 11 refills | Status: DC

## 2017-06-24 MED ORDER — RANITIDINE HCL 150 MG PO TABS: 150.0000 mg | ORAL_TABLET | Freq: Two times a day (BID) | ORAL | Status: DC

## 2017-06-24 NOTE — Patient Instructions (Signed)
Esophagitis Esophagitis is inflammation of the esophagus. The esophagus is the tube that carries food and liquids from your mouth to your stomach. Esophagitis can cause soreness or pain in the esophagus. This condition can make it difficult and painful to swallow. What are the causes? Most causes of esophagitis are not serious. Common causes of this condition include:  Gastroesophageal reflux disease (GERD). This is when stomach contents move back up into the esophagus (reflux).  Repeated vomiting.  An allergic-type reaction, especially caused by food allergies (eosinophilic esophagitis).  Injury to the esophagus by swallowing large pills with or without water, or swallowing certain types of medicines.  Swallowing (ingesting) harmful chemicals, such as household cleaning products.  Heavy alcohol use.  An infection of the esophagus.This most often occurs in people who have a weakened immune system.  Radiation or chemotherapy treatment for cancer.  Certain diseases such as sarcoidosis, Crohn disease, and scleroderma.  What are the signs or symptoms? Symptoms of this condition include:  Difficult or painful swallowing.  Pain with swallowing acidic liquids, such as citrus juices.  Pain with burping.  Chest pain.  Difficulty breathing.  Nausea.  Vomiting.  Pain in the abdomen.  Weight loss.  Ulcers in the mouth.  Patches of white material in the mouth (candidiasis).  Fever.  Coughing up blood or vomiting blood.  Stool that is black, tarry, or bright red.  How is this diagnosed? Your health care provider will take a medical history and perform a physical exam. You may also have other tests, including:  An endoscopy to examine your stomach and esophagus with a small camera.  A test that measures the acidity level in your esophagus.  A test that measures how much pressure is on your esophagus.  A barium swallow or modified barium swallow to show the shape,  size, and functioning of your esophagus.  Allergy tests.  How is this treated? Treatment for this condition depends on the cause of your esophagitis. In some cases, steroids or other medicines may be given to help relieve your symptoms or to treat the underlying cause of your condition. You may have to make some lifestyle changes, such as:  Avoiding alcohol.  Quitting smoking.  Changing your diet.  Exercising.  Changing your sleep habits and your sleep environment.  Follow these instructions at home: Take these actions to decrease your discomfort and to help avoid complications. Diet  Follow a diet as recommended by your health care provider. This may involve avoiding foods and drinks such as: ? Coffee and tea (with or without caffeine). ? Drinks that contain alcohol. ? Energy drinks and sports drinks. ? Carbonated drinks or sodas. ? Chocolate and cocoa. ? Peppermint and mint flavorings. ? Garlic and onions. ? Horseradish. ? Spicy and acidic foods, including peppers, chili powder, curry powder, vinegar, hot sauces, and barbecue sauce. ? Citrus fruit juices and citrus fruits, such as oranges, lemons, and limes. ? Tomato-based foods, such as red sauce, chili, salsa, and pizza with red sauce. ? Fried and fatty foods, such as donuts, french fries, potato chips, and high-fat dressings. ? High-fat meats, such as hot dogs and fatty cuts of red and white meats, such as rib eye steak, sausage, ham, and bacon. ? High-fat dairy items, such as whole milk, butter, and cream cheese.  Eat small, frequent meals instead of large meals.  Avoid drinking large amounts of liquid with your meals.  Avoid eating meals during the 2-3 hours before bedtime.  Avoid lying down right   after you eat.  Do not exercise right after you eat.  Avoid foods and drinks that seem to make your symptoms worse. General instructions  Pay attention to any changes in your symptoms.  Take over-the-counter and  prescription medicines only as told by your health care provider. Do not take aspirin, ibuprofen, or other NSAIDs unless your health care provider told you to do so.  If you have trouble taking pills, use a pill splitter to decrease the size of the pill. This will decrease the chance of the pill getting stuck or injuring your esophagus on the way down. Also, drink water after you take a pill.  Do not use any tobacco products, including cigarettes, chewing tobacco, and e-cigarettes. If you need help quitting, ask your health care provider.  Wear loose-fitting clothing. Do not wear anything tight around your waist that causes pressure on your abdomen.  Raise (elevate) the head of your bed about 6 inches (15 cm).  Try to reduce your stress, such as with yoga or meditation. If you need help reducing stress, ask your health care provider.  If you are overweight, reduce your weight to an amount that is healthy for you. Ask your health care provider for guidance about a safe weight loss goal.  Keep all follow-up visits as told by your health care provider. This is important. Contact a health care provider if:  You have new symptoms.  You have unexplained weight loss.  You have difficulty swallowing, or it hurts to swallow.  You have wheezing or a persistent cough.  Your symptoms do not improve with treatment.  You have frequent heartburn for more than two weeks. Get help right away if:  You have severe pain in your arms, neck, jaw, teeth, or back.  You feel sweaty, dizzy, or light-headed.  You have chest pain or shortness of breath.  You vomit and your vomit looks like blood or coffee grounds.  Your stool is bloody or black.  You have a fever.  You cannot swallow, drink, or eat. This information is not intended to replace advice given to you by your health care provider. Make sure you discuss any questions you have with your health care provider. Document Released: 10/17/2004  Document Revised: 02/15/2016 Document Reviewed: 01/04/2015 [employer] Interactive Patient Education  2018 [employer]     

## 2017-06-24 NOTE — Progress Notes (Signed)
Impression and Recommendations:    1. Epigastric pain   2. Gastroesophageal reflux disease, esophagitis presence not specified   3. Burping   4. Heartburn   5. Obesity, Class I, BMI 30-34.9   6. Benign hypertensive heart disease without heart failure   7. Need for influenza vaccination    - Physical exam reassuring.  No red flag symptoms which we discussed today for patient to look out for them. - Labs per below; including stool for H. pylori - Trial of PPI and H2 blocker daily. -   Wt lose would help sx potentially  - Follow up if no improvement or worsens.    Next step may be GI consultation and possible egd. The patient was counseled, risk factors were discussed, anticipatory guidance given.   Return in about 6 months (around 12/23/2017), or Much sooner if symptoms worsen or fail to improve.  Please see AVS handed out to patient at the end of our visit for further patient instructions/ counseling done pertaining to today's office visit.    Note: This document was prepared using Dragon voice recognition software and may include unintentional dictation errors.   --------------------------------------------------------------------------------------------------------------------------------------------------------------------------------------------------------------------------------------------    Subjective:    CC:  Chief Complaint  Patient presents with  . Abdominal Pain    HPI: [name] is a 76 y.o. female who presents to [place] [facility] today for discussion of her abd pain.   1. Last saw patient in January 2018 and patient was told to return in 4 months for recheck with me and we would obtain A1c and vitamin D.  Patient was lost to follow-up and did not.  2. 3-4 wks ago- started with burning/ gnawing pain and aggrivation and N in epigastric region before and after eating.  Burping will help relieve sx.    Everyday she takes  zantac for this-->  Helps a lot but doesn't go away.  Certain foods- chocolate, cheese - makes the worse.  Has never treid anythign else for this. Has had these same sx one and off in distant past and w/up was negative for GB dx about 8-10 years ago which is pt's main concern-   patient does not take any NSAIDs.   Also states occasionally feels like things will get stuck in throat or that she has food stuck in her epigastric region.  This comes and goes.  3. 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 She is under more stress.  She does a lot of lifting and moving him.  He is 78 years old.   4. HTN- well controlled; stable. No highs/ lows.  Asx    Wt Readings from Last 3 Encounters:  06/24/17 175 lb 14.4 oz (79.8 kg)  02/19/17 177 lb 6.4 oz (80.5 kg)  02/12/17 176 lb (79.8 kg)   BP Readings from Last 3 Encounters:  06/24/17 124/70  02/19/17 120/60  02/12/17 133/64   Pulse Readings from Last 3 Encounters:  06/24/17 63  02/19/17 64  02/12/17 73   BMI Readings from Last 3 Encounters:  06/24/17 31.16 kg/m  02/19/17 31.42 kg/m  02/12/17 29.06 kg/m     Patient Care Team    Relationship Specialty Notifications Start End  Mellody Dance, DO PCP - General Family Medicine  05/10/16   Jettie Booze, MD Consulting Physician Cardiology  02/14/16   Garald Balding, MD Consulting Physician  Orthopedic Surgery  06/04/16   Martinique, Amy, [place] Physician Dermatology  06/04/16   Megan Salon, MD Consulting Physician Gynecology  06/04/16   Angelia Mould, MD Consulting Physician Vascular Surgery  06/07/16    Comment: follows carotids;    Sees Nickel, Vinnie Level, NP  Ronald Lobo, MD Consulting Physician Gastroenterology  06/24/17    Comment: went for colonoscopy    Patient Active Problem List   Diagnosis Date Noted  . Obesity, Class I, BMI 30-34.9 09/30/2016    Priority: High  . Bilateral  carotid artery disease ([place]) 06/10/2012    Priority: High  . Dyslipidemia- low HDL and inc VLDL & TG 04/04/2011    Priority: High  . Benign hypertensive heart disease without heart failure 04/04/2011    Priority: High  . Right carotid bruit 06/07/2016    Priority: Medium  . GERD (gastroesophageal reflux disease) 06/07/2016    Priority: Medium  . Moderate osteopenia 06/07/2016    Priority: Medium  . Hypothyroidism, postsurgical 05/05/2012    Priority: Medium  . Vitamin D deficiency 09/30/2016    Priority: Low  . h/o Diverticulosis 06/07/2016    Priority: Low  . Environmental and seasonal allergies 06/07/2016    Priority: Low  . Arthritis 06/07/2016    Priority: Low    Past Medical history, Surgical history, Family history, Social history, Allergies and Medications have been entered into the medical record, reviewed and changed as needed.   Allergies:  Allergies  Allergen Reactions  . Zebeta Other (See Comments)    Does not work, per patient.  . Ace Inhibitors Other (See Comments)    cough    Review of Systems  Constitutional: Negative for chills, diaphoresis, fever and weight loss.  HENT: Negative for congestion, nosebleeds and sinus pain.   Eyes: Negative for blurred vision and double vision.  Respiratory: Negative for shortness of breath and wheezing.   Cardiovascular: Negative for chest pain, palpitations, orthopnea and claudication.  Gastrointestinal: Positive for heartburn and nausea. Negative for abdominal pain, blood in stool, constipation, diarrhea, melena and vomiting.       Chronic heartburn  Genitourinary: Negative for frequency and hematuria.  Musculoskeletal: Positive for joint pain. Negative for falls and myalgias.       Chronic jt pains  Skin: Negative for rash.  Neurological: Negative for dizziness, sensory change and focal weakness.  Endo/Heme/Allergies: Negative for polydipsia.       Thirsty- thinks she never drinks enough water.    Psychiatric/Behavioral: Negative for depression, memory loss and suicidal ideas. The patient is not nervous/anxious.      Objective:   Blood pressure 124/70, pulse 63, temperature 97.7 F (36.5 C), temperature source Oral, height 5' 3" (1.6 m), weight 175 lb 14.4 oz (79.8 kg), last menstrual period 09/24/1979. Body mass index is 31.16 kg/m. General: Well Developed, well nourished, appropriate for stated age.  Neuro: Alert and oriented x3, extra-ocular muscles intact, sensation grossly intact.  HEENT: Normocephalic, atraumatic, neck supple, + Bilateral carotid bruits appreciated  Skin: no gross rash. Cardiac: RRR, S1 S2 Respiratory: ECTA B/L, Not using accessory muscles, speaking in full sentences-unlabored. Abdomen: No guarding rigidity rebound, only mild tenderness epigastric region to very deep palpation.  Positive bowel sounds 4.  No bloating.  Negative Murphy sign  Vascular:  Ext warm, dry, pink; cap RF less 2 sec., no peripheral edema. Psych: No HI/SI, judgement and insight good, Euthymic mood. Full Affect.     Ref Range & Units  (08/26/16) 60yrago (02/13/16)  4yrago (09/05/15)   Cholesterol <200 mg/dL 145  145R  133R   Comment: ** Please note change in reference range(s). **      Triglycerides <150 mg/dL 158   <150 mg/dL" class="rz_c hlt1024"> 202   <150 mg/dL" class="rz_d hlt1024"> 197    Comment: ** Please note change in reference range(s). **      HDL >50 mg/dL 37   38R   37R    Comment: ** Please note change in reference range(s). **      Total CHOL/HDL Ratio <5.0 Ratio 3.9  <=5.0 Ratio" class="rz_c hlt1024"> 3.8R  <=5.0 Ratio" class="rz_d hlt1024"> 3.6R    VLDL <30 mg/dL 32   <30 mg/dL" class="rz_c hlt1024"> 40   <30 mg/dL" class="rz_d hlt1024"> 39     LDL Cholesterol <100 mg/dL 76  <130 mg/dL" class="rz_c hlt1024"> 67R, CM  <130 mg/dL" class="rz_d hlt1024"> 57R, CM   Comment: ** Please note change in reference range(s). **     Resulting Agency  [facility]   Performed at: [address]       [place], [place] [address]   Specimen Collected: 08/26/16 10:35 Last Resulted: 08/27/16 02:22            CM=Additional commentsR=Reference range differs from displayed range        Other Results from 08/26/2016     CBC with Differential/Platelet  Order: [id number]  Status:  Final result Visible to patient:  No (Not Released) Next appt:  08/11/2018 at 02:00 PM in Cardiology ([facility] Echo 3) Dx:  Benign hypertensive heart disease wit...     Notes Recorded by TFonnie Mu CMA on 08/28/2016 at 1:15 PM EST 08/28/16 Pt informed of results. Pt expressed understanding and is agreeable. Pt transferred to front desk to schedule appointment. TCharyl Bigger CMA  ------  Notes Recorded by DMellody Dance DO on 08/27/2016 at 8:19 AM EST Please inform patient that there are several abnormal lab values that we need discuss in person and devise a game plan together of how to improve them---->  she has not been seen since September which was the only time I had seen her/ was first visit and she was told to follow-up in the very near future and never did. She is 76years old and needs to be followed more regularly as we discussed in our office visit.  Until next office visit have her take 5000 IUs vitamin D3 over-the-counter daily, drink more water to one half of her weight in ounces of water per day, take over-the-counter slow release niacin, exercise more- and we will discuss the specifics on her follow-up office visit.  Please have him/her make a follow-up appointment with me at their earliest convenience if they don't already have one.  Thanks, Dr OJenetta Downer   Ref Range & Units  592yrgo   WBC 3.8 - 10.8 K/uL 8.3  7.8R    RBC 3.80 - 5.10 MIL/uL 3.82  4.28R    Hemoglobin 11.7 - 15.5 g/dL 12.1  13.5R    HCT 35.0 - 45.0 % 35.3  38.8R    MCV 80.0 - 100.0 fL 92.4  90.7R     MCH 27.0 - 33.0 pg 31.7  31.5R    MCHC 32.0 - 36.0 g/dL 34.3  34.8R    RDW 11.0 - 15.0 % 13.6  13.0R    Platelets 140 - 400 K/uL  307  278R    MPV 7.5 - 12.5 fL 9.7     Neutro Abs 1,500 - 7,800 cells/uL 4,814     Lymphs Abs 850 - 3,900 cells/uL 2,075     Monocytes Absolute 200 - 950 cells/uL 996      Eosinophils Absolute 15 - 500 cells/uL 415     Basophils Absolute 0 - 200 cells/uL 0     Neutrophils Relative % % 58     Lymphocytes Relative % 25     Monocytes Relative % 12     Eosinophils Relative % 5     Basophils Relative % 0     Smear Review  Criteria for review not met    Resulting Agency  [facility]  Narrative   Performed at: [address]        [place], [place] [address]    Specimen Collected: 08/26/16 10:35 Last Resulted: 08/26/16 22:56            R=Reference range differs from displayed range             Vitamin B12  Order: [id number]   Ref Range & Units 670moago  Vitamin B-12 200 - 1,100 pg/mL 394   Resulting Agency  [facility]  Narrative   Performed at: [address]       [place], [place] [address]   Specimen Collected: 08/26/16 10:35 Last Resulted: 08/27/16 00:08                     VITAMIN D 25 Hydroxy (Vit-D Deficiency, Fractures)  Order: [id number]   Newer results are available. Click to view them now.   Ref Range & Units 610mogo  Vit D, 25-Hydroxy 30 - 100 ng/mL 19    Comment: Vitamin D Status      25-OH Vitamin D     Deficiency        <20 ng/mL     Insufficiency     20 - 29 ng/mL     Optimal       > or = 30 ng/mL    For 25-OH Vitamin D testing on patients on D2-supplementation and  patients for whom quantitation of D2 and D3 fractions is required, the  QuestAssureD 25-OH VIT D, (D2,D3), LC/MS/MS is recommended: order code  [id number] > 2 yrs).   Resulting Agency  [facility]  Narrative     Performed at: [address]      [place], [place] [id number]  Specimen Collected: 08/26/16 10:35 Last Resulted: 08/27/16 02:04                    TSH  Order: [id number]    Ref Range & Units 70m52moo 85yr67yr 85yr 34yr  TSH mIU/L 1.90  1.724R  3.462R   Comment:   Reference Range    > or = 20 Years 0.40-4.50    Pregnancy Range  First trimester 0.26-2.66  Second trimester 0.55-2.73  Third trimester 0.43-2.91     Resulting Agency  [facility]   Performed at: [address]   [id number]  Specimen Collected: 08/26/16 10:35 Last Resulted: 08/26/16 23:46            R=Reference range differs from displayed range          T4, free  Order: [id number]   Status:  Final result Visible to patient:  No (Not Released) Next appt:  08/11/2018 at 02:00 PM in Cardiology ([facility] Echo 3) Dx:  Benign hypertensive heart disease wit...       Ref Range & Units 41moago  Free T4 0.8 - 1.8 ng/dL 1.3   Resulting Agency  [facility]  Narrative   Performed at: [address]       [place], [place] [address]   Specimen Collected: 08/26/16 10:35 Last Resulted: 08/26/16 23:46

## 2017-06-25 LAB — COMPREHENSIVE METABOLIC PANEL
ALT: 23 IU/L (ref 0–32)
AST: 23 IU/L (ref 0–40)
Albumin/Globulin Ratio: 1.9 (ref 1.2–2.2)
Albumin: 4.8 g/dL (ref 3.5–4.8)
Alkaline Phosphatase: 67 IU/L (ref 39–117)
BUN/Creatinine Ratio: 24 (ref 12–28)
BUN: 23 mg/dL (ref 8–27)
Bilirubin Total: 0.4 mg/dL (ref 0.0–1.2)
CALCIUM: 9.6 mg/dL (ref 8.7–10.3)
CO2: 24 mmol/L (ref 20–29)
CREATININE: 0.96 mg/dL (ref 0.57–1.00)
Chloride: 103 mmol/L (ref 96–106)
GFR, EST AFRICAN AMERICAN: 66 mL/min/{1.73_m2} (ref >59)
GFR, EST NON AFRICAN AMERICAN: 57 mL/min/{1.73_m2} — AB (ref >59)
GLOBULIN, TOTAL: 2.5 g/dL (ref 1.5–4.5)
Glucose: 83 mg/dL (ref 65–99)
Potassium: 4.9 mmol/L (ref 3.5–5.2)
Sodium: 144 mmol/L (ref 134–144)
TOTAL PROTEIN: 7.3 g/dL (ref 6.0–8.5)

## 2017-06-25 LAB — CBC WITH DIFFERENTIAL/PLATELET
BASOS ABS: 0.1 10*3/uL (ref 0.0–0.2)
Basos: 1 %
EOS (ABSOLUTE): 0.4 10*3/uL (ref 0.0–0.4)
Eos: 4 %
HEMOGLOBIN: 12.8 g/dL (ref 11.1–15.9)
Hematocrit: 37.4 % (ref 34.0–46.6)
IMMATURE GRANS (ABS): 0 10*3/uL (ref 0.0–0.1)
Immature Granulocytes: 0 %
LYMPHS: 27 %
Lymphocytes Absolute: 2.5 10*3/uL (ref 0.7–3.1)
MCH: 31.4 pg (ref 26.6–33.0)
MCHC: 34.2 g/dL (ref 31.5–35.7)
MCV: 92 fL (ref 79–97)
MONOCYTES: 12 %
Monocytes Absolute: 1.1 10*3/uL — ABNORMAL HIGH (ref 0.1–0.9)
Neutrophils Absolute: 5.2 10*3/uL (ref 1.4–7.0)
Neutrophils: 56 %
Platelets: 337 10*3/uL (ref 150–379)
RBC: 4.07 x10E6/uL (ref 3.77–5.28)
RDW: 13.7 % (ref 12.3–15.4)
WBC: 9.2 10*3/uL (ref 3.4–10.8)

## 2017-06-25 LAB — LIPASE: Lipase: 33 U/L (ref 14–85)

## 2017-06-25 LAB — AMYLASE: AMYLASE: 65 U/L (ref 31–124)

## 2017-07-03 DIAGNOSIS — R142 Eructation: Secondary | ICD-10-CM | POA: Diagnosis not present

## 2017-07-03 DIAGNOSIS — R12 Heartburn: Secondary | ICD-10-CM | POA: Diagnosis not present

## 2017-07-03 DIAGNOSIS — R1013 Epigastric pain: Secondary | ICD-10-CM | POA: Diagnosis not present

## 2017-07-03 DIAGNOSIS — K219 Gastro-esophageal reflux disease without esophagitis: Secondary | ICD-10-CM | POA: Diagnosis not present

## 2017-07-07 ENCOUNTER — Telehealth: Payer: Self-pay | Admitting: Family Medicine

## 2017-07-07 NOTE — Telephone Encounter (Signed)
Patient called wanted to spk w/PCP or medical assistant, states she is confused about medication instruction-- Patient states was told to take Prilosec for 14dys and then quit for 4 months or vice versa??? --Pt request provider call her to clarify @ [phone]. --glh

## 2017-07-08 ENCOUNTER — Telehealth: Payer: Self-pay | Admitting: Family Medicine

## 2017-07-08 NOTE — Telephone Encounter (Signed)
Patient is still waiting to hear about Prilosec, please advise

## 2017-07-08 NOTE — Telephone Encounter (Signed)
Spoke to patient she is taking Prilosec 20 mg once a day and Zantac 150 mg once a day but not everyday.  Patient states that she is feeling better and is on day 14 and wants to know if she needs to stop the medication.  Per Dr. Raliegh Scarlet patient is to stop but is symptoms return patient is to start both medications at least once a day.  I informed patient that if symptoms return to call the office and I will explain to her directions of medications again if she needs me to. MPulliam, CMA/RT(R)

## 2017-07-08 NOTE — Telephone Encounter (Signed)
Please see other note. MPulliam, CMA/RT(R)

## 2017-07-10 LAB — H. PYLORI ANTIGEN, STOOL: H pylori Ag, Stl: NEGATIVE

## 2017-07-24 DIAGNOSIS — H04123 Dry eye syndrome of bilateral lacrimal glands: Secondary | ICD-10-CM | POA: Diagnosis not present

## 2017-08-24 NOTE — Progress Notes (Signed)
Cardiology Office Note   Date:  08/26/2017   ID:  [name], DOB 1941-04-14, MRN [id number]  PCP:  Maria Dance, DO    No chief complaint on file.    Wt Readings from Last 3 Encounters:  08/26/17 177 lb 12.8 oz (80.6 kg)  06/24/17 175 lb 14.4 oz (79.8 kg)  02/19/17 177 lb 6.4 oz (80.5 kg)       History of Present Illness: [name] is a 76 y.o. female  has a past history of dyslipidemia.  She was previously followed by Dr. Mare Ferrari.  She has a history of known residual right carotid bruit after remote right carotid endarterectomy.  She does not have any history of ischemic heart disease. She had a normal nuclear stress test in 2005. She has had essential hypertension and obesity.   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 She is under more stress. Home health has been coming in.   Her thyroid disease is followed by her GYN physician. She follows with vascular surgery regarding her carotid disease.  Sh eha sha dsome GERD.  She took prilosec.  Now taking Zantac.  Denies : Chest pain. Dizziness. Leg edema. Nitroglycerin use. Orthopnea. Palpitations. Paroxysmal nocturnal dyspnea. Shortness of breath. Syncope.     Past Medical History:  Diagnosis Date  . Arthritis   . Back pain    had cortisone injection 6/15, Dr Durward Fortes  . Carotid artery occlusion   . Carotid bruit   . Diverticulosis   . GERD (gastroesophageal reflux disease)   . Hyperlipidemia   . Hypertension   . Injury of left leg 06/05/12   Pt. fell  . Peripheral vascular disease ([place])   . Thyroid disease 02-27-12   lt. thyroid nodule, bx. done 5 yrs ago-now some enlargement is seen.    Past Surgical History:  Procedure Laterality Date  . ABDOMINAL HYSTERECTOMY  01/1980  . CAROTID ENDARTERECTOMY  1998   Stable since surgery -slight build up returning-follows with yrly Dopplers  . CATARACT EXTRACTION, BILATERAL  2004 or  2005 per pt  . COLONOSCOPY    . EYE SURGERY  02-27-12   Bil. Cataract surgery  . THYROID LOBECTOMY  03/02/2012   Procedure: THYROID LOBECTOMY;  Surgeon: Earnstine Regal, MD;  Location: [facility];  Service: General;  Laterality: Left;     Current Outpatient Medications  Medication Sig Dispense Refill  . acetaminophen (TYLENOL) 500 MG tablet Take 1,000 mg by mouth 2 (two) times daily. For arthritis pain    . amLODipine (NORVASC) 2.5 MG tablet Take 1 tablet (2.5 mg total) by mouth daily with breakfast. 90 tablet 3  . aspirin 81 MG EC tablet Take 162 mg by mouth daily with breakfast. Taking 2 daily    . B Complex Vitamins (VITAMIN-B COMPLEX PO) Take 1 tablet by mouth daily with breakfast.     . Cholecalciferol (VITAMIN D3) 5000 units CAPS Take 1 capsule by mouth daily.     Marland Kitchen docusate sodium (COLACE) 100 MG capsule Take 100 mg by mouth 2 (two) times daily.    . hydrochlorothiazide (HYDRODIURIL) 25 MG tablet Take 1 tablet (25 mg total) by mouth daily. 90 tablet 3  . KRILL OIL PO Take 500 mg by mouth daily. MEGA RED    . levothyroxine (SYNTHROID, LEVOTHROID) 112 MCG tablet Take 1 tablet (112 mcg total) by mouth daily. 90 tablet 4  . Loratadine (CLARITIN) 10  MG CAPS Take 10 mg by mouth as needed (allergies). As needed    . metoprolol succinate (TOPROL-XL) 100 MG 24 hr tablet Take 1 tablet (100 mg total) by mouth daily with breakfast. Take with or immediately following a meal. 90 tablet 3  . Multiple Vitamin (MULTIVITAMIN) tablet Take 1 tablet by mouth daily.      Marland Kitchen omeprazole (PRILOSEC) 20 MG capsule Take 1 capsule (20 mg total) by mouth daily. Once or twice daily. 30 capsule 11  . ranitidine (ZANTAC) 150 MG tablet Take 150 mg by mouth as needed for heartburn.    . ranitidine (ZANTAC) 150 MG tablet Take 1 tablet (150 mg total) by mouth 2 (two) times daily. Once or twice daily    . rosuvastatin (CRESTOR) 20 MG tablet Take 1 tablet (20 mg total) by mouth daily with breakfast. 90 tablet 3  . telmisartan  (MICARDIS) 80 MG tablet Take 1 tablet (80 mg total) by mouth daily. 90 tablet 3  . verapamil (CALAN-SR) 240 MG CR tablet Take 1 tablet (240 mg total) by mouth daily with breakfast. 90 tablet 3   No current facility-administered medications for this visit.     Allergies:   Zebeta and Ace inhibitors    Social History:  The patient  reports that she quit smoking about 41 years ago. she has never used smokeless tobacco. She reports that she drinks alcohol. She reports that she does not use drugs.   Family History:  The patient's family history includes Arthritis in her mother; Atrial fibrillation in her mother; Heart disease in her brother and mother; Hyperlipidemia in her mother; Hypertension in her mother; Stroke in her mother.    ROS:  Please see the history of present illness.   Otherwise, review of systems are positive for GERD.   All other systems are reviewed and negative.    PHYSICAL EXAM: VS:  BP 110/62   Pulse 67   Ht 5\' 3"  (1.6 m)   Wt 177 lb 12.8 oz (80.6 kg)   LMP 09/24/1979   SpO2 95%   BMI 31.50 kg/m  , BMI Body mass index is 31.5 kg/m. GEN: Well nourished, well developed, in no acute distress  HEENT: normal  Neck: no JVD, ; bilateral carotid bruits right greater than left; no masses Cardiac: RRR; no murmurs, rubs, or gallops,no edema  Respiratory:  clear to auscultation bilaterally, normal work of breathing GI: soft, nontender, nondistended, + BS MS: no deformity or atrophy  Skin: warm and dry, no rash Neuro:  Strength and sensation are intact Psych: euthymic mood, full affect   EKG:   The ekg ordered today demonstrates normal ECG   Recent Labs: 06/24/2017: ALT 23; BUN 23; Creatinine, Ser 0.96; Hemoglobin 12.8; Platelets 337; Potassium 4.9; Sodium 144   Lipid Panel    Component Value Date/Time   CHOL 159 02/19/2017 1151   TRIG 236 (H) 02/19/2017 1151   HDL 38 (L) 02/19/2017 1151   CHOLHDL 4.2 02/19/2017 1151   CHOLHDL 3.9 08/26/2016 1035   VLDL 32 (H)  08/26/2016 1035   LDLCALC 74 02/19/2017 1151   LDLDIRECT 73.4 01/07/2013 0939     Other studies Reviewed: Additional studies/ records that were reviewed today with results demonstrating: 2018 carotid Doppler reviewed.   ASSESSMENT AND PLAN:  1. Hypertensive heart disease without heart failure: BP well controlled.  Continue current medications. 2. Hyperlipidemia: LDL 74 in 5/18.  Continue current medications. 3. Asymptomatic carotid bruit: Moderate bilateral carotid disease.  Followed at [facility].  4. GERD: Okay to use Zantac. She has been using this over-the-counter. She prefers this over Prilosec due to the possibility of long-term side effects. She took a 14 day course of Prilosec.   Current medicines are reviewed at length with the patient today.  The patient concerns regarding her medicines were addressed.  The following changes have been made:  No change  Labs/ tests ordered today include:  No orders of the defined types were placed in this encounter.   Recommend 150 minutes/week of aerobic exercise Low fat, low carb, high fiber diet recommended  Disposition:   FU in 6 months   Signed, Larae Grooms, MD  08/26/2017 11:42 AM    [facility] [address], [place], [place]  [address] Phone: [phone]; Fax: [phone]

## 2017-08-26 ENCOUNTER — Encounter: Payer: Self-pay | Admitting: Interventional Cardiology

## 2017-08-26 ENCOUNTER — Encounter (INDEPENDENT_AMBULATORY_CARE_PROVIDER_SITE_OTHER): Payer: Self-pay

## 2017-08-26 ENCOUNTER — Ambulatory Visit: Payer: Medicare Other | Admitting: Interventional Cardiology

## 2017-08-26 VITALS — BP 110/62 | HR 67 | Ht 63.0 in | Wt 177.8 lb

## 2017-08-26 DIAGNOSIS — K219 Gastro-esophageal reflux disease without esophagitis: Secondary | ICD-10-CM | POA: Diagnosis not present

## 2017-08-26 DIAGNOSIS — I6523 Occlusion and stenosis of bilateral carotid arteries: Secondary | ICD-10-CM | POA: Diagnosis not present

## 2017-08-26 DIAGNOSIS — R0989 Other specified symptoms and signs involving the circulatory and respiratory systems: Secondary | ICD-10-CM | POA: Diagnosis not present

## 2017-08-26 DIAGNOSIS — I119 Hypertensive heart disease without heart failure: Secondary | ICD-10-CM | POA: Diagnosis not present

## 2017-08-26 DIAGNOSIS — E785 Hyperlipidemia, unspecified: Secondary | ICD-10-CM | POA: Diagnosis not present

## 2017-08-26 NOTE — Patient Instructions (Signed)

## 2017-09-29 ENCOUNTER — Other Ambulatory Visit: Payer: Self-pay | Admitting: Interventional Cardiology

## 2017-09-29 NOTE — Telephone Encounter (Signed)
Medication Detail    Disp Refills Start End   amLODipine (NORVASC) 2.5 MG tablet 90 tablet 3 02/19/2017    Sig - Route: Take 1 tablet (2.5 mg total) by mouth daily with breakfast. - Oral   Sent to pharmacy as: amLODipine (NORVASC) 2.5 MG tablet   E-Prescribing Status: Receipt confirmed by pharmacy (02/19/2017 11:34 AM EDT)   Pharmacy   [facility] [address]

## 2017-10-28 ENCOUNTER — Other Ambulatory Visit: Payer: Self-pay | Admitting: *Deleted

## 2017-10-28 DIAGNOSIS — E038 Other specified hypothyroidism: Secondary | ICD-10-CM

## 2017-10-28 NOTE — Telephone Encounter (Signed)
Medication refill request: levothyroxine  Last AEX:  09-03-16  Next AEX: not scheduled ( called patient to schedule- patient declined at this time)  Last MMG (if hormonal medication request): 02-21-17 WNL  Refill authorized: please advise

## 2017-10-30 ENCOUNTER — Telehealth: Payer: Self-pay | Admitting: Family Medicine

## 2017-10-30 NOTE — Telephone Encounter (Signed)
Patient called states she is just about out of synthroid meds &  Gyn (original [facility]) will not call in Rx due to lack of OV(pt sees Gyn every 2 years)not annually--- Patient states she now wants Dr. Raliegh Scarlet to take over monitoring & prescribing.  ---Patient wanted to come in for Non-Ordered Labs to check  (thyroid levels) -- advised would forward message to nurse & provider to review & approve (Blood Draw).  --Pt states is just about out of medicines, will run out on Thursday, 11/06/17.  Please call patient if any questions--[phone].

## 2017-10-30 NOTE — Telephone Encounter (Signed)
Spoke to patient.  Notified that we can do this.  Patient to come in tomorrow 10/31/2017 for labs. MPulliam, CMA/RT(R)

## 2017-10-31 ENCOUNTER — Other Ambulatory Visit: Payer: Medicare Other

## 2017-10-31 DIAGNOSIS — E890 Postprocedural hypothyroidism: Secondary | ICD-10-CM

## 2017-10-31 NOTE — Telephone Encounter (Signed)
I'm going to forward this RF request to Dr. Raliegh Scarlet.  Pt just had lab work today and will need refill based on lab results.  Deb--hope that is ok.  Thanks.  Vinnie Level

## 2017-11-01 LAB — T4, FREE: Free T4: 1.16 ng/dL (ref 0.82–1.77)

## 2017-11-01 LAB — TSH: TSH: 4.37 u[IU]/mL (ref 0.450–4.500)

## 2017-11-03 ENCOUNTER — Telehealth: Payer: Self-pay | Admitting: Family Medicine

## 2017-11-03 ENCOUNTER — Other Ambulatory Visit: Payer: Self-pay

## 2017-11-03 DIAGNOSIS — E038 Other specified hypothyroidism: Secondary | ICD-10-CM

## 2017-11-03 MED ORDER — LEVOTHYROXINE SODIUM 112 MCG PO TABS: 112.0000 ug | ORAL_TABLET | Freq: Every day | ORAL | 1 refills | Status: DC

## 2017-11-03 NOTE — Telephone Encounter (Signed)
Patient is requesting a refill on Levothyroxine, last filled by GYN. Patient has labs for thyroid levels drawn on 10/31/2017. Sent to Dr. Raliegh Scarlet to review.  Patient last seen on 06/24/2017 and next appointment is on 12/23/2017. MPulliam, CMA/RT(R)

## 2017-11-03 NOTE — Telephone Encounter (Signed)
Medication last filled by GYN - Sent request to Dr. Raliegh Scarlet to review. MPulliam, CMA/RT(R)

## 2017-11-03 NOTE — Telephone Encounter (Signed)
Since labs are normal, okay to refill medicines.  Please give 90+1 refill

## 2017-11-03 NOTE — Telephone Encounter (Signed)
Patient called to see if Lab results in from Friday's draw---Pt also requesting a 3 month supply of : levothyroxine (SYNTHROID, LEVOTHROID) 112 MCG tablet [[id number]]  Order Details  Dose: 112 mcg Route: Oral Frequency: Daily  Indications of Use: Hypothyroidism  Dispense Quantity: 90 tablet Refills: 4 Fills remaining: --        Sig: Take 1 tablet (112 mcg total) by mouth daily.     Be called into pharmacy.  Pt uses:  Preferred Pharmacies      [facility] - [place], [address]. [phone] (Phone) [phone] (Fax)   --Dion Body

## 2017-12-23 ENCOUNTER — Ambulatory Visit: Payer: Medicare Other | Admitting: Family Medicine

## 2017-12-30 ENCOUNTER — Ambulatory Visit (INDEPENDENT_AMBULATORY_CARE_PROVIDER_SITE_OTHER): Payer: Medicare Other | Admitting: Family Medicine

## 2017-12-30 ENCOUNTER — Encounter: Payer: Self-pay | Admitting: Family Medicine

## 2017-12-30 VITALS — BP 136/68 | HR 65 | Ht 63.0 in | Wt 175.8 lb

## 2017-12-30 DIAGNOSIS — E785 Hyperlipidemia, unspecified: Secondary | ICD-10-CM

## 2017-12-30 DIAGNOSIS — E038 Other specified hypothyroidism: Secondary | ICD-10-CM | POA: Diagnosis not present

## 2017-12-30 DIAGNOSIS — R0989 Other specified symptoms and signs involving the circulatory and respiratory systems: Secondary | ICD-10-CM | POA: Diagnosis not present

## 2017-12-30 DIAGNOSIS — K219 Gastro-esophageal reflux disease without esophagitis: Secondary | ICD-10-CM | POA: Diagnosis not present

## 2017-12-30 DIAGNOSIS — J3089 Other allergic rhinitis: Secondary | ICD-10-CM

## 2017-12-30 DIAGNOSIS — E890 Postprocedural hypothyroidism: Secondary | ICD-10-CM

## 2017-12-30 DIAGNOSIS — I119 Hypertensive heart disease without heart failure: Secondary | ICD-10-CM | POA: Diagnosis not present

## 2017-12-30 DIAGNOSIS — E669 Obesity, unspecified: Secondary | ICD-10-CM | POA: Diagnosis not present

## 2017-12-30 MED ORDER — LEVOTHYROXINE SODIUM 112 MCG PO TABS: 112.0000 ug | ORAL_TABLET | Freq: Every day | ORAL | 3 refills | Status: DC

## 2017-12-30 NOTE — Progress Notes (Signed)
Impression and Recommendations:    1. Obesity, Class I, BMI 30-34.9   2. Dyslipidemia- low HDL and inc VLDL & TG   3. Benign hypertensive heart disease without heart failure   4. Right carotid bruit   5. Hypothyroidism, postsurgical   6. Gastroesophageal reflux disease, esophagitis presence not specified   7. Environmental and seasonal allergies   8. Other specified hypothyroidism    1. HTN - Blood pressure is well controlled.  Continue on medications as prescribed. - Continue following up with Dr. Irish Lack.  2. Thyroid - Would like her prescription extended for a year-long supply.  3. Vitamin D Insufficiency - Reviewed that vitamin D should be at 50-60.  Was at 40 last measurement. - Takes 5000 IU's every day. - Reviewed that this is good prevention for osteoporosis.  4. GERD - GERD is well controlled after treatment with omeprazole. - Patient should continue using zantac as needed when feeling indigestion.  - Discussed with patient lifestyle modification including not eating or drinking (even water) within 2 hours of lying down, avoiding trigger foods, decrease caffeine intake, decrease abdominal girth (if applicable), no tobacco cessation (if applicable).  5. General Health Maintenance Explained to patient what BMI refers to, and what it means medically.    Told patient to think about it as a "medical risk stratification measurement" and how increasing BMI is associated with increasing risk/ or worsening state of various diseases such as hypertension, hyperlipidemia, diabetes, premature OA, depression etc.  American Heart Association guidelines for healthy diet, basically Mediterranean diet, and exercise guidelines of 30 minutes 5 days per week or more discussed in detail.  Health counseling performed.  All questions answered.  - Advised patient to continue working toward exercising to improve health.    - Patient may begin with 15 minutes of activity daily.  Recommended that the patient eventually strive for at least 150 minutes of cardio per week according to the [facility].   - Healthy dietary habits encouraged, including low-carb, and high amounts of lean protein in diet.   - Patient should also consume adequate amounts of water - half of body weight in oz of water per day  6. Follow-Up - Last labs were 06/24/2017.  Next lab work is due in October of this year, 2019.  - Patient will return in 6 months and come fasting, to get a full set of labs and CA 125 to be done yearly as Dr. Sabra Heck does it once every 2 years.    Meds ordered this encounter  Medications  . levothyroxine (SYNTHROID, LEVOTHROID) 112 MCG tablet    Sig: Take 1 tablet (112 mcg total) by mouth daily.    Dispense:  90 tablet    Refill:  3    Gross side effects, risk and benefits, and alternatives of medications and treatment plan in general discussed with patient.  Patient is aware that all medications have potential side effects and we are unable to predict every side effect or drug-drug interaction that may occur.   Patient will call with any questions prior to using medication if they have concerns.  Expresses verbal understanding and consents to current therapy and treatment regimen.  No barriers to understanding were identified.  Red flag symptoms and signs discussed in detail.  Patient expressed understanding regarding what to do in case of emergency\urgent symptoms  Please see AVS handed out to patient at the end of our visit for further patient instructions/ counseling done pertaining to today's  office visit.   Return for 6 mo; .    Note: This note was prepared with assistance of Dragon voice recognition software. Occasional wrong-word or sound-a-like substitutions may have occurred due to the inherent limitations of voice recognition software.   This document serves as a record of services personally performed by Mellody Dance, DO. It was created on her behalf by  Toni Amend, a trained medical scribe. The creation of this record is based on the scribe's personal observations and the provider's statements to them.   I have reviewed the above medical documentation for accuracy and completeness and I concur.  Mellody Dance 12/30/17 6:16 PM   ----------------------------------------------------------------------------------------------------------------------------------------------------------   Subjective:     HPI: [name] is a 77 y.o. female who presents to [place] [facility] today for issues as discussed below.  Dermatology Continues to visit dermatology once per year.  OBGYN Now sees Dr. Sabra Heck once every 2 years.  Vitamin D Continues taking Vitamin D every day, 5000 IU's.  Cardiac Gets her carotids checked bilaterally every day  Denies dizziness, visual changes.  Once in a while has headaches in the morning when she gets up.  After she's up, moving around, she feels fine.  Notes that she feels these sinuses may be sinus related and seasonal.  Drinks 4-5 glasses of water per day, plus tea.  Knows she needs to drink more water.  GERD At last appointment, she visited with concerns for reflux and indigestion.  She took the omeprazole for two weeks as indicated on the instructions.  Then she called the clinic to make sure she could quit the omeprazole if she felt better.  Now if she feels indigestion she takes a zantac OTC, and that's all she needs to control her symptoms.  She avoids her triggers, which include coffee, occasionally cheese, and chocolate.  Notes that spicy food and garlic will also bother her.  She tries to have dinner by 7 and doesn't go to bed until 12.  She drinks water or caffeine-free iced tea.  Thyroid Denies fatigue or any new symptoms.    Physical Activity & Home Life Has not been exercising because sometimes she's just so tired from taking care of her husband [name], she  can't even think about exercise.  She is still his primary caregiver.  He has metastatic prostate cancer and has not been doing well. She continues doing a lot of lifting and moving him.  He is 79 years old.  They do have plans in place moving forward, but this clearly occupies a lot of her thoughts and time.  She is going on a vacation to the beach soon to take a break from care giving.  Her daughter arranged the trip on her behalf, to visit with her girlfriends.  1. HTN HPI:  -  Her blood pressure has been controlled at home.  Pt is checking it at home.    - Usually around 120-130, so it's higher than usual on intake today at 140/77.  Blood pressure re-checked in left arm by Dr. Raliegh Scarlet, 136/68.  - Patient reports good compliance with blood pressure medications  - Denies medication S-E  - Is still seeing Dr. Irish Lack; sees him again in June.   - Smoking Status noted   - She denies new onset of: chest pain, exercise intolerance, shortness of breath, dizziness, visual changes, headache, lower extremity swelling or claudication.   Last 3 blood pressure readings in our office are as  follows: BP Readings from Last 3 Encounters:  12/30/17 136/68  08/26/17 110/62  06/24/17 124/70    Filed Weights   12/30/17 1447  Weight: 175 lb 12.8 oz (79.7 kg)    Wt Readings from Last 3 Encounters:  12/30/17 175 lb 12.8 oz (79.7 kg)  08/26/17 177 lb 12.8 oz (80.6 kg)  06/24/17 175 lb 14.4 oz (79.8 kg)   BP Readings from Last 3 Encounters:  12/30/17 136/68  08/26/17 110/62  06/24/17 124/70   Pulse Readings from Last 3 Encounters:  12/30/17 65  08/26/17 67  06/24/17 63   BMI Readings from Last 3 Encounters:  12/30/17 31.14 kg/m  08/26/17 31.50 kg/m  06/24/17 31.16 kg/m     Patient Care Team    Relationship Specialty Notifications Start End  Mellody Dance, DO PCP - General Family Medicine  05/10/16   Jettie Booze, MD Consulting Physician Cardiology  02/14/16     Garald Balding, MD Consulting Physician Orthopedic Surgery  06/04/16   Martinique, Amy, [place] Physician Dermatology  06/04/16   Megan Salon, MD Consulting Physician Gynecology  06/04/16   Angelia Mould, MD Consulting Physician Vascular Surgery  06/07/16    Comment: follows carotids;    Sees Nickel, Vinnie Level, NP  Ronald Lobo, MD Consulting Physician Gastroenterology  06/24/17    Comment: went for colonoscopy     Patient Active Problem List   Diagnosis Date Noted  . Obesity, Class I, BMI 30-34.9 09/30/2016    Priority: High  . Bilateral carotid artery disease ([place]) 06/10/2012    Priority: High  . Dyslipidemia- low HDL and inc VLDL & TG 04/04/2011    Priority: High  . Benign hypertensive heart disease without heart failure 04/04/2011    Priority: High  . Right carotid bruit 06/07/2016    Priority: Medium  . GERD (gastroesophageal reflux disease) 06/07/2016    Priority: Medium  . Moderate osteopenia 06/07/2016    Priority: Medium  . Hypothyroidism, postsurgical 05/05/2012    Priority: Medium  . Vitamin D deficiency 09/30/2016    Priority: Low  . h/o Diverticulosis 06/07/2016    Priority: Low  . Environmental and seasonal allergies 06/07/2016    Priority: Low  . Arthritis 06/07/2016    Priority: Low    Past Medical history, Surgical history, Family history, Social history, Allergies and Medications have been entered into the medical record, reviewed and changed as needed.    Current Meds  Medication Sig  . acetaminophen (TYLENOL) 500 MG tablet Take 1,000 mg by mouth 2 (two) times daily. For arthritis pain  . amLODipine (NORVASC) 2.5 MG tablet TAKE 1 TABLET BY MOUTH DAILY WITH BREAKFAST  . aspirin 81 MG EC tablet Take 81 mg by mouth daily with breakfast. Taking 2 daily  . B Complex Vitamins (VITAMIN-B COMPLEX PO) Take 1 tablet by mouth daily with breakfast.   . Cholecalciferol (VITAMIN D3) 5000 units CAPS Take 1 capsule by mouth daily.   Marland Kitchen docusate sodium  (COLACE) 100 MG capsule Take 100 mg by mouth 2 (two) times daily.  . hydrochlorothiazide (HYDRODIURIL) 25 MG tablet Take 1 tablet (25 mg total) by mouth daily.  Marland Kitchen KRILL OIL PO Take 500 mg by mouth daily. MEGA RED  . levothyroxine (SYNTHROID, LEVOTHROID) 112 MCG tablet Take 1 tablet (112 mcg total) by mouth daily.  . Loratadine (CLARITIN) 10 MG CAPS Take 10 mg by mouth as needed (allergies). As needed  . metoprolol succinate (TOPROL-XL) 100 MG 24 hr tablet Take 1  tablet (100 mg total) by mouth daily with breakfast. Take with or immediately following a meal.  . Multiple Vitamin (MULTIVITAMIN) tablet Take 1 tablet by mouth daily.    . ranitidine (ZANTAC) 150 MG tablet Take 150 mg by mouth as needed for heartburn.  . rosuvastatin (CRESTOR) 20 MG tablet Take 1 tablet (20 mg total) by mouth daily with breakfast.  . telmisartan (MICARDIS) 80 MG tablet Take 1 tablet (80 mg total) by mouth daily.  . verapamil (CALAN-SR) 240 MG CR tablet Take 1 tablet (240 mg total) by mouth daily with breakfast.  . [DISCONTINUED] levothyroxine (SYNTHROID, LEVOTHROID) 112 MCG tablet Take 1 tablet (112 mcg total) by mouth daily.    Allergies:  Allergies  Allergen Reactions  . Zebeta Other (See Comments)    Does not work, per patient.  . Ace Inhibitors Other (See Comments)    cough     Review of Systems:  A fourteen system review of systems was performed and found to be positive as per HPI.   Objective:   Blood pressure 136/68, pulse 65, height 5\' 3"  (1.6 m), weight 175 lb 12.8 oz (79.7 kg), last menstrual period 09/24/1979, SpO2 98 %. Body mass index is 31.14 kg/m. General:  Well Developed, well nourished, appropriate for stated age.  Neuro:  Alert and oriented,  extra-ocular muscles intact  HEENT:  Normocephalic, atraumatic, neck supple, no carotid bruits appreciated  Skin:  no gross rash, warm, pink. Cardiac:  RRR, S1 S2 Respiratory:  ECTA B/L and A/P, Not using accessory muscles, speaking in full  sentences- unlabored. Vascular:  Ext warm, no cyanosis apprec.; cap RF less 2 sec. Psych:  No HI/SI, judgement and insight good, Euthymic mood. Full Affect.

## 2017-12-30 NOTE — Patient Instructions (Signed)
Please come fasting next appointment as we can get a full set of labs as well as a CA 125 to be done yearly since Dr. Sabra Heck your GYN does it every 2 years.

## 2018-01-26 ENCOUNTER — Other Ambulatory Visit: Payer: Self-pay | Admitting: Family Medicine

## 2018-01-26 DIAGNOSIS — Z1231 Encounter for screening mammogram for malignant neoplasm of breast: Secondary | ICD-10-CM

## 2018-02-11 ENCOUNTER — Other Ambulatory Visit: Payer: Self-pay | Admitting: Interventional Cardiology

## 2018-03-03 ENCOUNTER — Ambulatory Visit
Admission: RE | Admit: 2018-03-03 | Discharge: 2018-03-03 | Disposition: A | Discharge status: Home / Self Care | Payer: Medicare Other | Source: Ambulatory Visit | Attending: Family Medicine | Admitting: Family Medicine

## 2018-03-03 DIAGNOSIS — Z1231 Encounter for screening mammogram for malignant neoplasm of breast: Secondary | ICD-10-CM | POA: Diagnosis not present

## 2018-03-12 ENCOUNTER — Ambulatory Visit: Payer: Medicare Other | Admitting: Interventional Cardiology

## 2018-03-12 ENCOUNTER — Encounter: Payer: Self-pay | Admitting: Interventional Cardiology

## 2018-03-12 VITALS — BP 134/60 | HR 68 | Ht 63.0 in | Wt 177.0 lb

## 2018-03-12 DIAGNOSIS — I6523 Occlusion and stenosis of bilateral carotid arteries: Secondary | ICD-10-CM

## 2018-03-12 DIAGNOSIS — E785 Hyperlipidemia, unspecified: Secondary | ICD-10-CM | POA: Diagnosis not present

## 2018-03-12 DIAGNOSIS — I119 Hypertensive heart disease without heart failure: Secondary | ICD-10-CM

## 2018-03-12 LAB — COMPREHENSIVE METABOLIC PANEL
A/G RATIO: 2.4 — AB (ref 1.2–2.2)
ALBUMIN: 4.8 g/dL (ref 3.5–4.8)
ALK PHOS: 66 IU/L (ref 39–117)
ALT: 22 IU/L (ref 0–32)
AST: 18 IU/L (ref 0–40)
BILIRUBIN TOTAL: 0.5 mg/dL (ref 0.0–1.2)
BUN / CREAT RATIO: 19 (ref 12–28)
BUN: 20 mg/dL (ref 8–27)
CO2: 21 mmol/L (ref 20–29)
CREATININE: 1.06 mg/dL — AB (ref 0.57–1.00)
Calcium: 9.7 mg/dL (ref 8.7–10.3)
Chloride: 102 mmol/L (ref 96–106)
GFR calc Af Amer: 58 mL/min/{1.73_m2} — ABNORMAL LOW (ref >59)
GFR calc non Af Amer: 50 mL/min/{1.73_m2} — ABNORMAL LOW (ref >59)
GLOBULIN, TOTAL: 2 g/dL (ref 1.5–4.5)
Glucose: 95 mg/dL (ref 65–99)
POTASSIUM: 4.8 mmol/L (ref 3.5–5.2)
SODIUM: 138 mmol/L (ref 134–144)
Total Protein: 6.8 g/dL (ref 6.0–8.5)

## 2018-03-12 LAB — LIPID PANEL
CHOL/HDL RATIO: 4.6 ratio — AB (ref 0.0–4.4)
CHOLESTEROL TOTAL: 169 mg/dL (ref 100–199)
HDL: 37 mg/dL — ABNORMAL LOW (ref >39)
LDL CALC: 93 mg/dL (ref 0–99)
Triglycerides: 196 mg/dL — ABNORMAL HIGH (ref 0–149)
VLDL Cholesterol Cal: 39 mg/dL (ref 5–40)

## 2018-03-12 MED ORDER — METOPROLOL SUCCINATE ER 100 MG PO TB24: ORAL_TABLET | ORAL | 3 refills | Status: DC

## 2018-03-12 MED ORDER — ROSUVASTATIN CALCIUM 20 MG PO TABS: 20.0000 mg | ORAL_TABLET | Freq: Every day | ORAL | 3 refills | Status: DC

## 2018-03-12 MED ORDER — TELMISARTAN 80 MG PO TABS: 80.0000 mg | ORAL_TABLET | Freq: Every day | ORAL | 3 refills | Status: DC

## 2018-03-12 MED ORDER — HYDROCHLOROTHIAZIDE 25 MG PO TABS: 25.0000 mg | ORAL_TABLET | Freq: Every day | ORAL | 3 refills | Status: DC

## 2018-03-12 MED ORDER — VERAPAMIL HCL ER 240 MG PO TBCR: 240.0000 mg | EXTENDED_RELEASE_TABLET | Freq: Every day | ORAL | 3 refills | Status: DC

## 2018-03-12 NOTE — Patient Instructions (Signed)
Medication Instructions:  Your physician recommends that you continue on your current medications as directed. Please refer to the Current Medication list given to you today.   Labwork: TODAY: CMET, LIPIDS  Testing/Procedures: None ordered  Follow-Up: Your physician wants you to follow-up in: 6 months with Dr. Varanasi. You will receive a reminder letter in the mail two months in advance. If you don't receive a letter, please call our office to schedule the follow-up appointment.   Any Other Special Instructions Will Be Listed Below (If Applicable).     If you need a refill on your cardiac medications before your next appointment, please call your pharmacy.   

## 2018-03-12 NOTE — Progress Notes (Signed)
Cardiology Office Note   Date:  03/12/2018   ID:  [name], DOB 20-Jun-1941, MRN [id number]  PCP:  Mellody Dance, DO    No chief complaint on file.  HTN  Wt Readings from Last 3 Encounters:  03/12/18 177 lb (80.3 kg)  12/30/17 175 lb 12.8 oz (79.7 kg)  08/26/17 177 lb 12.8 oz (80.6 kg)       History of Present Illness: [name] is a 77 y.o. female  has a past history of dyslipidemia. She was previously followed by Dr. Mare Ferrari. She has a history of known residual right carotid bruit after remote right carotid endarterectomy.  She does not have any history of ischemic heart disease. She had a normal nuclear stress test in 2005. She has had essential hypertension and obesity.   The patient is the caregiver for her husband who has metastatic prostate cancer.   Her thyroid disease is followed by her GYN physician. She follows with vascular surgery regarding her carotid disease.  Chronic GERD sx.  She remains active.  She plays bridge and goes to church.  She is not walking regularly.     Past Medical History:  Diagnosis Date  . Arthritis   . Back pain    had cortisone injection 6/15, Dr Durward Fortes  . Carotid artery occlusion   . Carotid bruit   . Diverticulosis   . GERD (gastroesophageal reflux disease)   . Hyperlipidemia   . Hypertension   . Injury of left leg 06/05/12   Pt. fell  . Peripheral vascular disease ([place])   . Thyroid disease 02-27-12   lt. thyroid nodule, bx. done 5 yrs ago-now some enlargement is seen.    Past Surgical History:  Procedure Laterality Date  . ABDOMINAL HYSTERECTOMY  01/1980  . CAROTID ENDARTERECTOMY  1998   Stable since surgery -slight build up returning-follows with yrly Dopplers  . CATARACT EXTRACTION, BILATERAL  2004 or 2005 per pt  . COLONOSCOPY    . EYE SURGERY  02-27-12   Bil. Cataract surgery  . THYROID LOBECTOMY  03/02/2012   Procedure: THYROID LOBECTOMY;  Surgeon: Earnstine Regal, MD;  Location: [facility]S;   Service: General;  Laterality: Left;     Current Outpatient Medications  Medication Sig Dispense Refill  . acetaminophen (TYLENOL) 500 MG tablet Take 1,000 mg by mouth 2 (two) times daily. For arthritis pain    . amLODipine (NORVASC) 2.5 MG tablet TAKE 1 TABLET BY MOUTH DAILY WITH BREAKFAST 90 tablet 3  . aspirin 81 MG EC tablet Take 81 mg by mouth daily.     . B Complex Vitamins (VITAMIN-B COMPLEX PO) Take 1 tablet by mouth daily with breakfast.     . Cholecalciferol (VITAMIN D3) 5000 units CAPS Take 1 capsule by mouth daily.     Marland Kitchen docusate sodium (COLACE) 100 MG capsule Take 100 mg by mouth 2 (two) times daily.    . hydrochlorothiazide (HYDRODIURIL) 25 MG tablet TAKE 1 TABLET BY MOUTH DAILY 90 tablet 1  . KRILL OIL PO Take 500 mg by mouth daily. MEGA RED    . levothyroxine (SYNTHROID, LEVOTHROID) 112 MCG tablet Take 1 tablet (112 mcg total) by mouth daily. 90 tablet 3  . Loratadine (CLARITIN) 10 MG CAPS Take 10 mg by mouth as needed (allergies). As needed    . metoprolol succinate (TOPROL-XL) 100 MG 24 hr tablet TAKE 1 TABLET BY MOUTH DAILY WITH BREAKFAST OR IMMEDIATELY FOLLOWING A MEAL 90 tablet 1  .  Multiple Vitamin (MULTIVITAMIN) tablet Take 1 tablet by mouth daily.      . ranitidine (ZANTAC) 150 MG tablet Take 150 mg by mouth as needed for heartburn.    . rosuvastatin (CRESTOR) 20 MG tablet Take 1 tablet (20 mg total) by mouth daily with breakfast. 90 tablet 3  . telmisartan (MICARDIS) 80 MG tablet TAKE 1 TABLET BY MOUTH DAILY 90 tablet 1  . verapamil (CALAN-SR) 240 MG CR tablet TAKE 1 TABLET BY MOUTH DAILY WITH BREAKFAST 90 tablet 1   No current facility-administered medications for this visit.     Allergies:   Zebeta and Ace inhibitors    Social History:  The patient  reports that she quit smoking about 42 years ago. She has never used smokeless tobacco. She reports that she drinks about 0.6 - 1.2 oz of alcohol per week. She reports that she does not use drugs.   Family  History:  The patient's family history includes Arthritis in her mother; Atrial fibrillation in her mother; Heart disease in her brother and mother; Hyperlipidemia in her mother; Hypertension in her mother; Stroke in her mother.    ROS:  Please see the history of present illness.   Otherwise, review of systems are positive for back pain.   All other systems are reviewed and negative.    PHYSICAL EXAM: VS:  BP 134/60   Pulse 68   Ht 5\' 3"  (1.6 m)   Wt 177 lb (80.3 kg)   LMP 09/24/1979   SpO2 98%   BMI 31.35 kg/m  , BMI Body mass index is 31.35 kg/m. GEN: Well nourished, well developed, in no acute distress  HEENT: normal  Neck: no JVD,; bilateral carotid bruits- R>L, no masses Cardiac: RRR; no murmurs, rubs, or gallops,no edema  Respiratory:  clear to auscultation bilaterally, normal work of breathing GI: soft, nontender, nondistended, + BS MS: no deformity or atrophy  Skin: warm and dry, no rash Neuro:  Strength and sensation are intact Psych: euthymic mood, full affect     Recent Labs: 06/24/2017: ALT 23; BUN 23; Creatinine, Ser 0.96; Hemoglobin 12.8; Platelets 337; Potassium 4.9; Sodium 144 10/31/2017: TSH 4.370   Lipid Panel    Component Value Date/Time   CHOL 159 02/19/2017 1151   TRIG 236 (H) 02/19/2017 1151   HDL 38 (L) 02/19/2017 1151   CHOLHDL 4.2 02/19/2017 1151   CHOLHDL 3.9 08/26/2016 1035   VLDL 32 (H) 08/26/2016 1035   LDLCALC 74 02/19/2017 1151   LDLDIRECT 73.4 01/07/2013 0939     Other studies Reviewed: Additional studies/ records that were reviewed today with results demonstrating: Cr 0.9 in 10/18.   ASSESSMENT AND PLAN:  1. Hypertensive heart disease: BP has been controlled. Conitnue current meds.  Increase exercise as noted below.  2. Hyperlipidemia: Check lipids today.  COntinue statin.  3. Carotid disease: U/s in November.  Stable.  COntinue medical therapy. 4. GERD: Tried Zantac after Prilosec.  Sx improved.   Current medicines are reviewed  at length with the patient today.  The patient concerns regarding her medicines were addressed.  The following changes have been made:  No change  Labs/ tests ordered today include:  No orders of the defined types were placed in this encounter.   Recommend 150 minutes/week of aerobic exercise Low fat, low carb, high fiber diet recommended  Disposition:   FU in 1 year   Signed, Larae Grooms, MD  03/12/2018 9:53 AM    [facility]  [address], [place], [place]  [address] Phone: [phone]; Fax: [phone]

## 2018-07-27 ENCOUNTER — Ambulatory Visit (INDEPENDENT_AMBULATORY_CARE_PROVIDER_SITE_OTHER): Payer: Medicare Other | Admitting: Family Medicine

## 2018-07-27 ENCOUNTER — Encounter: Payer: Self-pay | Admitting: Family Medicine

## 2018-07-27 ENCOUNTER — Other Ambulatory Visit (INDEPENDENT_AMBULATORY_CARE_PROVIDER_SITE_OTHER): Payer: Medicare Other

## 2018-07-27 ENCOUNTER — Other Ambulatory Visit: Payer: Self-pay

## 2018-07-27 VITALS — BP 145/73 | HR 74 | Ht 63.0 in | Wt 174.3 lb

## 2018-07-27 DIAGNOSIS — I119 Hypertensive heart disease without heart failure: Secondary | ICD-10-CM

## 2018-07-27 DIAGNOSIS — E785 Hyperlipidemia, unspecified: Secondary | ICD-10-CM

## 2018-07-27 DIAGNOSIS — I779 Disorder of arteries and arterioles, unspecified: Secondary | ICD-10-CM

## 2018-07-27 DIAGNOSIS — E890 Postprocedural hypothyroidism: Secondary | ICD-10-CM

## 2018-07-27 DIAGNOSIS — I739 Peripheral vascular disease, unspecified: Secondary | ICD-10-CM

## 2018-07-27 DIAGNOSIS — E669 Obesity, unspecified: Secondary | ICD-10-CM

## 2018-07-27 DIAGNOSIS — E559 Vitamin D deficiency, unspecified: Secondary | ICD-10-CM

## 2018-07-27 DIAGNOSIS — R457 State of emotional shock and stress, unspecified: Secondary | ICD-10-CM | POA: Insufficient documentation

## 2018-07-27 NOTE — Progress Notes (Signed)
Impression and Recommendations:    1. Benign hypertensive heart disease without heart failure   2. Bilateral carotid artery disease, unspecified type ([place])   3. Dyslipidemia- low HDL and inc VLDL & TG   4. Hypothyroidism, postsurgical   5. Obesity, Class I, BMI 30-34.9   6. Caregiver stress syndrome     1. Benign Hypertensive Heart Disease w/out Heart Failure - Elevated in office today. - Per patient, controlled at home. - Continue treatment plan as prescribed.  See med list today. - Patient tolerating meds well without complication.  Denies S-E.  - Reviewed goal as 130's/70's. - Will continue to monitor.  - Lifestyle changes such as dash diet and engaging in a regular exercise program discussed with patient.  Educational handouts provided  - Ambulatory BP monitoring encouraged. Keep log and bring in next OV.  2. Bilateral carotid artery disease, unspecified type - Encouraged patient to follow up with specialists as recommended. - Per pt, has screen scheduled for her carotid arteries this month.  3. Caregiver Stress - Stress Management - Encouraged patient to take care of herself more actively, especially as she continues to manage and oversee her husband's care.   - Advised patient to look into options for doctors that are capable of making house calls, to minimize cost of caring for her bedbound husband's various health problems.  4. Thyroid Management - Hypothyroidism, postsurgical - Stable at this time. - Continue on synthroid as prescribed. - Will continue to monitor.  5. BMI Counseling - Obesity, Class I, BMI 30-34.9 Explained to patient what BMI refers to, and what it means medically.    Told patient to think about it as a "medical risk stratification measurement" and how increasing BMI is associated with increasing risk/ or worsening state of various diseases such as hypertension, hyperlipidemia, diabetes, premature OA, depression etc.  American Heart  Association guidelines for healthy diet, basically Mediterranean diet, and exercise guidelines of 30 minutes 5 days per week or more discussed in detail.  Health counseling performed.  All questions answered.  6. Lifestyle & Preventative Health Maintenance - Advised patient to continue working toward exercising to improve overall mental, physical, and emotional health.    - Encouraged patient to engage in daily physical activity, especially a formal exercise routine.  Recommended that the patient eventually strive for at least 150 minutes of moderate cardiovascular activity per week according to guidelines established by the [facility].   - Healthy dietary habits encouraged, including low-carb, and high amounts of lean protein in diet.   - Patient should also consume adequate amounts of water.   Education and routine counseling performed. Handouts provided.  7. Follow-Up - Prescriptions refilled today PRN. - Re-check lab work as recommended. - Otherwise, continue to return for CPE and chronic follow-up as scheduled.   - Patient knows to call in sooner if desired to address acute concerns.  No orders of the defined types were placed in this encounter.   No orders of the defined types were placed in this encounter.   Medications Discontinued During This Encounter  Medication Reason  . ranitidine (ZANTAC) 150 MG tablet Patient Preference     The patient was counseled, risk factors were discussed, anticipatory guidance given.  Gross side effects, risk and benefits, and alternatives of medications discussed with patient.  Patient is aware that all medications have potential side effects and we are unable to predict every side effect or drug-drug interaction that may occur.  Expresses verbal  understanding and consents to current therapy plan and treatment regimen.  Return for BP, Chol etc 6 mo.  Please see AVS handed out to patient at the end of our visit for further patient instructions/  counseling done pertaining to today's office visit.    Note:  This document was prepared using Dragon voice recognition software and may include unintentional dictation errors.  This document serves as a record of services personally performed by Mellody Dance, DO. It was created on her behalf by Toni Amend, a trained medical scribe. The creation of this record is based on the scribe's personal observations and the provider's statements to them.   I have reviewed the above medical documentation for accuracy and completeness and I concur.  Mellody Dance 07/27/18 5:23 PM    Subjective:    HPI: [name] is a 77 y.o. female who presents to [place] at [facility] today for follow up of [place].    Notes that her husband's health problems are driving her crazy. States that many issues have arisen since her husband hasn't been to visit the clinic in so long.  Transporting her husband has become expensive and cost-prohibitive.  States that she wants to keep her husband at home since he wants to be at home.  She does not desire to put him in assisted living at this time.  Her plan is to have her husband seen here at the clinic again this week so that he can be cleared for other care.  Caregiver Stress - Husband Ill and Bedbound at Home Has been staying with her husband 12-14 hours per day at the hospital and nursing home.  She has people from [place] come stay with him at night, from 10 to 6.  Has help through the [facility].  These helpers come and stay for a week at a time.  She does not like having someone in the house all the time, but she needs that support.  States she cannot turn her husband on her own.  [facility] comes to draw blood for oncology.  Patient states that her husband's PSA is over 300.  The oncologist has stated that "as long as he's not having bone pain, we're going to stay the course for his treatment."   Patient's husband's cancer is now in his pelvis, right hip, and spine.  Stress Management Notes that her main source of stress is with relation to her husband and his care.  She states "I don't do anything for me right now."  She has a treadmill at home, but per pt is constantly taking care of her husband and "running back and forth from the hospital, and whatever."  Feels that as soon as she has her "workers under a wrap," she will feel less stressed.  Notes "so far it hasn't worked out that way" and feels she is not being delivered what was promised.  Patient goes to church on Sundays; plays Bridge six times per month.  States "I know how to relieve stress, but right now it's not possible."  Cardiology Follow-Up - Carotid Bruits Has an appointment to have her carotid bruits checked out this month, sometime in the next two weeks.   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  OBGYN Care Continues obtaining mammograms yearly.  Turning over her care from Dr. Sabra Heck, to be followed up here.  Patient states that her pap smears have always been  negative, and denies family history of ovarian cancer, cervical cancer, or other female cancer.  Patient was on hormone replacement therapy from age 63 to about age 40 (3 years ago), 20 years approximately.  Patient was recommended CA 125 test yearly to check for ovarian cancers.  Patient was last told "the ovaries are very boring."  Diet & Eating Habits Patient weighs herself at home and was 168 lbs recently.  Feels she would like to lose 20 more lbs "and be very very happy with that."  She desires her weight to keep going down.  Experiences leg cramps wen she doesn't drink enough water.  She drinks tea and water, "mostly water," and "when I stay hydrated, my legs don't cramp."  Denies Denies heart plapitations, chest tightness, SOB, excessive sweating, wheezing, coughing, or blood in her stool.  HTN:  -   Her blood pressure has been controlled at home.  Pt has been checking it regularly.  States it runs around 136/68 or 70.  - Patient reports good compliance with blood pressure medications.  - Denies medication S-E   - Smoking Status noted   - She denies new onset of: chest pain, exercise intolerance, shortness of breath, dizziness, visual changes, headache, lower extremity swelling or claudication.   Last 3 blood pressure readings in our office are as follows: BP Readings from Last 3 Encounters:  07/27/18 (!) 145/73  03/12/18 134/60  12/30/17 136/68    Pulse Readings from Last 3 Encounters:  07/27/18 74  03/12/18 68  12/30/17 65    Filed Weights   07/27/18 1635  Weight: 174 lb 4.8 oz (79.1 kg)     Patient Care Team    Relationship Specialty Notifications Start End  Mellody Dance, DO PCP - General Family Medicine  05/10/16   Jettie Booze, MD Consulting Physician Cardiology  02/14/16   Garald Balding, MD Consulting Physician Orthopedic Surgery  06/04/16   Martinique, Amy, MD Consulting Physician Dermatology  06/04/16   Megan Salon, MD Consulting Physician Gynecology  06/04/16   Angelia Mould, MD Consulting Physician Vascular Surgery  06/07/16    Comment: follows carotids;    Sees Nickel, Vinnie Level, NP  Ronald Lobo, MD Consulting Physician Gastroenterology  06/24/17    Comment: went for colonoscopy     Lab Results  Component Value Date   CREATININE 1.06 (H) 03/12/2018   BUN 20 03/12/2018   NA 138 03/12/2018   K 4.8 03/12/2018   CL 102 03/12/2018   CO2 21 03/12/2018    Lab Results  Component Value Date   CHOL 169 03/12/2018   CHOL 159 02/19/2017   CHOL 145 08/26/2016    Lab Results  Component Value Date   HDL 37 (L) 03/12/2018   HDL 38 (L) 02/19/2017   HDL 37 (L) 08/26/2016    Lab Results  Component Value Date   LDLCALC 93 03/12/2018   LDLCALC 74 02/19/2017   LDLCALC 76 08/26/2016    Lab Results  Component Value Date   TRIG  196 (H) 03/12/2018   TRIG 236 (H) 02/19/2017   TRIG 158 (H) 08/26/2016    Lab Results  Component Value Date   CHOLHDL 4.6 (H) 03/12/2018   CHOLHDL 4.2 02/19/2017   CHOLHDL 3.9 08/26/2016    Lab Results  Component Value Date   LDLDIRECT 73.4 01/07/2013   LDLDIRECT 85.8 08/27/2012   LDLDIRECT 93.5 04/24/2012   ===================================================================   Patient Active Problem List   Diagnosis Date Noted  .  Obesity, Class I, BMI 30-34.9 09/30/2016    Priority: High  . Bilateral carotid artery disease ([place]) 06/10/2012    Priority: High  . Dyslipidemia- low HDL and inc VLDL & TG 04/04/2011    Priority: High  . Benign hypertensive heart disease without heart failure 04/04/2011    Priority: High  . Right carotid bruit 06/07/2016    Priority: Medium  . GERD (gastroesophageal reflux disease) 06/07/2016    Priority: Medium  . Moderate osteopenia 06/07/2016    Priority: Medium  . Hypothyroidism, postsurgical 05/05/2012    Priority: Medium  . Vitamin D deficiency 09/30/2016    Priority: Low  . h/o Diverticulosis 06/07/2016    Priority: Low  . Environmental and seasonal allergies 06/07/2016    Priority: Low  . Arthritis 06/07/2016    Priority: Low  . Caregiver stress syndrome 07/27/2018     Past Medical History:  Diagnosis Date  . Arthritis   . Back pain    had cortisone injection 6/15, Dr Durward Fortes  . Carotid artery occlusion   . Carotid bruit   . Diverticulosis   . GERD (gastroesophageal reflux disease)   . Hyperlipidemia   . Hypertension   . Injury of left leg 06/05/12   Pt. fell  . Peripheral vascular disease ([place])   . Thyroid disease 02-27-12   lt. thyroid nodule, bx. done 5 yrs ago-now some enlargement is seen.     Past Surgical History:  Procedure Laterality Date  . ABDOMINAL HYSTERECTOMY  01/1980  . CAROTID ENDARTERECTOMY  1998   Stable since surgery -slight build up returning-follows with yrly Dopplers  . CATARACT  EXTRACTION, BILATERAL  2004 or 2005 per pt  . COLONOSCOPY    . EYE SURGERY  02-27-12   Bil. Cataract surgery  . THYROID LOBECTOMY  03/02/2012   Procedure: THYROID LOBECTOMY;  Surgeon: Earnstine Regal, MD;  Location: [facility];  Service: General;  Laterality: Left;     Family History  Problem Relation Age of Onset  . Heart disease Mother   . Hypertension Mother   . Arthritis Mother   . Stroke Mother   . Hyperlipidemia Mother   . Atrial fibrillation Mother   . Heart disease Brother        before age 74  . Heart attack Neg Hx      Social History   Substance and Sexual Activity  Drug Use No  ,  Social History   Substance and Sexual Activity  Alcohol Use Yes  . Alcohol/week: 1.0 - 2.0 standard drinks  . Types: 1 - 2 Glasses of wine per week   Comment: 1-2 drinks a month  ,  Social History   Tobacco Use  Smoking Status Former Smoker  . Last attempt to quit: 09/24/1975  . Years since quitting: 42.8  Smokeless Tobacco Never Used  ,    Current Outpatient Medications on File Prior to Visit  Medication Sig Dispense Refill  . acetaminophen (TYLENOL) 500 MG tablet Take 1,000 mg by mouth 2 (two) times daily. For arthritis pain    . amLODipine (NORVASC) 2.5 MG tablet TAKE 1 TABLET BY MOUTH DAILY WITH BREAKFAST 90 tablet 3  . aspirin 81 MG EC tablet Take 81 mg by mouth daily.     . B Complex Vitamins (VITAMIN-B COMPLEX PO) Take 1 tablet by mouth daily with breakfast.     . Cholecalciferol (VITAMIN D3) 5000 units CAPS Take 1 capsule by mouth daily.     Marland Kitchen docusate sodium (COLACE) 100  MG capsule Take 100 mg by mouth 2 (two) times daily.    . hydrochlorothiazide (HYDRODIURIL) 25 MG tablet Take 1 tablet (25 mg total) by mouth daily. 90 tablet 3  . KRILL OIL PO Take 500 mg by mouth daily. MEGA RED    . levothyroxine (SYNTHROID, LEVOTHROID) 112 MCG tablet Take 1 tablet (112 mcg total) by mouth daily. 90 tablet 3  . Loratadine (CLARITIN) 10 MG CAPS Take 10 mg by mouth as needed (allergies). As  needed    . metoprolol succinate (TOPROL-XL) 100 MG 24 hr tablet TAKE 1 TABLET BY MOUTH DAILY WITH BREAKFAST OR IMMEDIATELY FOLLOWING A MEAL 90 tablet 3  . Multiple Vitamin (MULTIVITAMIN) tablet Take 1 tablet by mouth daily.      . rosuvastatin (CRESTOR) 20 MG tablet Take 1 tablet (20 mg total) by mouth daily with breakfast. 90 tablet 3  . telmisartan (MICARDIS) 80 MG tablet Take 1 tablet (80 mg total) by mouth daily. 90 tablet 3  . verapamil (CALAN-SR) 240 MG CR tablet Take 1 tablet (240 mg total) by mouth daily with breakfast. 90 tablet 3   No current facility-administered medications on file prior to visit.      Allergies  Allergen Reactions  . Zebeta Other (See Comments)    Does not work, per patient.  . Ace Inhibitors Other (See Comments)    cough     Review of Systems:   General:  Denies fever, chills Optho/Auditory:   Denies visual changes, blurred vision Respiratory:   Denies SOB, cough, wheeze, DIB  Cardiovascular:   Denies chest pain, palpitations, painful respirations Gastrointestinal:   Denies nausea, vomiting, diarrhea.  Endocrine:     Denies new hot or cold intolerance Musculoskeletal:  Denies joint swelling, gait issues, or new unexplained myalgias/ arthralgias Skin:  Denies rash, suspicious lesions  Neurological:    Denies dizziness, unexplained weakness, numbness  Psychiatric/Behavioral:   Denies mood changes  Objective:    Blood pressure (!) 145/73, pulse 74, height 5\' 3"  (1.6 m), weight 174 lb 4.8 oz (79.1 kg), last menstrual period 09/24/1979, SpO2 98 %.  Body mass index is 30.88 kg/m.  General: Well Developed, well nourished, and in no acute distress.  HEENT: Normocephalic, atraumatic, pupils equal round reactive to light, neck supple, No carotid bruits, no JVD Skin: Warm and dry, cap RF less 2 sec Cardiac: Regular rate and rhythm, S1, S2 WNL's, no murmurs rubs or gallops.   Right carotid bruit; mild left carotid bruit. Respiratory: ECTA B/L, Not using  accessory muscles, speaking in full sentences. NeuroM-Sk: Ambulates w/o assistance, moves ext * 4 w/o difficulty, sensation grossly intact.  Ext: scant edema b/l lower ext Psych: No HI/SI, judgement and insight good, Euthymic mood. Full Affect.

## 2018-07-27 NOTE — Patient Instructions (Addendum)
-Please continue to monitor your blood pressure at home and if it continues in the 130s over 70s at perfect.     Please realize, EXERCISE IS MEDICINE!  -  American Heart Association [facility]) guidelines for exercise : If you are in good health, without any medical conditions, you should engage in 150-300 minutes of moderate intensity aerobic activity per week.  This means you should be huffing and puffing throughout your workout.   Engaging in regular exercise will improve brain function and memory, as well as improve mood, boost immune system and help with weight management.  As well as the other, more well-known effects of exercise such as decreasing blood sugar levels, decreasing blood pressure,  and decreasing bad cholesterol levels/ increasing good cholesterol levels.     -  The AHA strongly endorses consumption of a diet that contains a variety of foods from all the food categories with an emphasis on fruits and vegetables; fat-free and low-fat dairy products; cereal and grain products; legumes and nuts; and fish, poultry, and/or extra lean meats.    Excessive food intake, especially of foods high in saturated and trans fats, sugar, and salt, should be avoided.    Adequate water intake of roughly 1/2 of your weight in pounds, should equal the ounces of water per day you should drink.  So for instance, if you're 200 pounds, that would be 100 ounces of water per day.         Mediterranean Diet  Why follow it? Research shows. . Those who follow the Mediterranean diet have a reduced risk of heart disease  . The diet is associated with a reduced incidence of Parkinson's and Alzheimer's diseases . People following the diet may have longer life expectancies and lower rates of chronic diseases  . The Dietary Guidelines for Americans recommends the Mediterranean diet as an eating plan to promote health and prevent disease  What Is the Mediterranean Diet?  . Healthy eating plan based on typical  foods and recipes of Mediterranean-style cooking . The diet is primarily a plant based diet; these foods should make up a majority of meals   Starches - Plant based foods should make up a majority of meals - They are an important sources of vitamins, minerals, energy, antioxidants, and fiber - Choose whole grains, foods high in fiber and minimally processed items  - Typical grain sources include wheat, oats, barley, corn, brown rice, bulgar, farro, millet, polenta, couscous  - Various types of beans include chickpeas, lentils, fava beans, black beans, white beans   Fruits  Veggies - Large quantities of antioxidant rich fruits & veggies; 6 or more servings  - Vegetables can be eaten raw or lightly drizzled with oil and cooked  - Vegetables common to the traditional Mediterranean Diet include: artichokes, arugula, beets, broccoli, brussel sprouts, cabbage, carrots, celery, collard greens, cucumbers, eggplant, kale, leeks, lemons, lettuce, mushrooms, okra, onions, peas, peppers, potatoes, pumpkin, radishes, rutabaga, shallots, spinach, sweet potatoes, turnips, zucchini - Fruits common to the Mediterranean Diet include: apples, apricots, avocados, cherries, clementines, dates, figs, grapefruits, grapes, melons, nectarines, oranges, peaches, pears, pomegranates, strawberries, tangerines  Fats - Replace butter and margarine with healthy oils, such as olive oil, canola oil, and tahini  - Limit nuts to no more than a handful a day  - Nuts include walnuts, almonds, pecans, pistachios, pine nuts  - Limit or avoid candied, honey roasted or heavily salted nuts - Olives are central to the Mediterranean diet - can be  eaten whole or used in a variety of dishes   Meats Protein - Limiting red meat: no more than a few times a month - When eating red meat: choose lean cuts and keep the portion to the size of deck of cards - Eggs: approx. 0 to 4 times a week  - Fish and lean poultry: at least 2 a week  - Healthy  protein sources include, chicken, [place], lean beef, lamb - Increase intake of seafood such as tuna, salmon, trout, mackerel, shrimp, scallops - Avoid or limit high fat processed meats such as sausage and bacon  Dairy - Include moderate amounts of low fat dairy products  - Focus on healthy dairy such as fat free yogurt, skim milk, low or reduced fat cheese - Limit dairy products higher in fat such as whole or 2% milk, cheese, ice cream  Alcohol - Moderate amounts of red wine is ok  - No more than 5 oz daily for women (all ages) and men older than age 36  - No more than 10 oz of wine daily for men younger than 53  Other - Limit sweets and other desserts  - Use herbs and spices instead of salt to flavor foods  - Herbs and spices common to the traditional Mediterranean Diet include: basil, bay leaves, chives, cloves, cumin, fennel, garlic, lavender, marjoram, mint, oregano, parsley, pepper, rosemary, sage, savory, sumac, tarragon, thyme   It's not just a diet, it's a lifestyle:  . The Mediterranean diet includes lifestyle factors typical of those in the region  . Foods, drinks and meals are best eaten with others and savored . Daily physical activity is important for overall good health . This could be strenuous exercise like running and aerobics . This could also be more leisurely activities such as walking, housework, yard-work, or taking the stairs . Moderation is the key; a balanced and healthy diet accommodates most foods and drinks . Consider portion sizes and frequency of consumption of certain foods   Meal Ideas & Options:  . Breakfast:  o Whole wheat toast or whole wheat English muffins with peanut butter & hard boiled egg o Steel cut oats topped with apples & cinnamon and skim milk  o Fresh fruit: banana, strawberries, melon, berries, peaches  o Smoothies: strawberries, bananas, greek yogurt, peanut butter o Low fat greek yogurt with blueberries and granola  o Egg white omelet  with spinach and mushrooms o Breakfast couscous: whole wheat couscous, apricots, skim milk, cranberries  . Sandwiches:  o Hummus and grilled vegetables (peppers, zucchini, squash) on whole wheat bread   o Grilled chicken on whole wheat pita with lettuce, tomatoes, cucumbers or tzatziki  o Tuna salad on whole wheat bread: tuna salad made with greek yogurt, olives, red peppers, capers, green onions o Garlic rosemary lamb pita: lamb sauted with garlic, rosemary, salt & pepper; add lettuce, cucumber, greek yogurt to pita - flavor with lemon juice and black pepper  . Seafood:  o Mediterranean grilled salmon, seasoned with garlic, basil, parsley, lemon juice and black pepper o Shrimp, lemon, and spinach whole-grain pasta salad made with low fat greek yogurt  o Seared scallops with lemon orzo  o Seared tuna steaks seasoned salt, pepper, coriander topped with tomato mixture of olives, tomatoes, olive oil, minced garlic, parsley, green onions and cappers  . Meats:  o Herbed greek chicken salad with kalamata olives, cucumber, feta  o Red bell peppers stuffed with spinach, bulgur, lean ground beef (or lentils) & topped  with feta   o Kebabs: skewers of chicken, tomatoes, onions, zucchini, squash  o [place] burgers: made with red onions, mint, dill, lemon juice, feta cheese topped with roasted red peppers . Vegetarian o Cucumber salad: cucumbers, artichoke hearts, celery, red onion, feta cheese, tossed in olive oil & lemon juice  o Hummus and whole grain pita points with a greek salad (lettuce, tomato, feta, olives, cucumbers, red onion) o Lentil soup with celery, carrots made with vegetable broth, garlic, salt and pepper  o Tabouli salad: parsley, bulgur, mint, scallions, cucumbers, tomato, radishes, lemon juice, olive oil, salt and pepper.

## 2018-07-28 LAB — CBC WITH DIFFERENTIAL/PLATELET
BASOS ABS: 0 10*3/uL (ref 0.0–0.2)
Basos: 1 %
EOS (ABSOLUTE): 0.1 10*3/uL (ref 0.0–0.4)
Eos: 2 %
HEMOGLOBIN: 12.5 g/dL (ref 11.1–15.9)
Hematocrit: 37.3 % (ref 34.0–46.6)
IMMATURE GRANULOCYTES: 0 %
Immature Grans (Abs): 0 10*3/uL (ref 0.0–0.1)
LYMPHS: 20 %
Lymphocytes Absolute: 1.6 10*3/uL (ref 0.7–3.1)
MCH: 31.2 pg (ref 26.6–33.0)
MCHC: 33.5 g/dL (ref 31.5–35.7)
MCV: 93 fL (ref 79–97)
MONOCYTES: 8 %
Monocytes Absolute: 0.6 10*3/uL (ref 0.1–0.9)
NEUTROS ABS: 5.3 10*3/uL (ref 1.4–7.0)
Neutrophils: 69 %
PLATELETS: 356 10*3/uL (ref 150–450)
RBC: 4.01 x10E6/uL (ref 3.77–5.28)
RDW: 13.2 % (ref 12.3–15.4)
WBC: 7.7 10*3/uL (ref 3.4–10.8)

## 2018-07-28 LAB — COMPREHENSIVE METABOLIC PANEL
A/G RATIO: 2.1 (ref 1.2–2.2)
ALK PHOS: 60 IU/L (ref 39–117)
ALT: 30 IU/L (ref 0–32)
AST: 27 IU/L (ref 0–40)
Albumin: 4.7 g/dL (ref 3.5–4.8)
BUN / CREAT RATIO: 18 (ref 12–28)
BUN: 19 mg/dL (ref 8–27)
Bilirubin Total: 0.4 mg/dL (ref 0.0–1.2)
CO2: 23 mmol/L (ref 20–29)
CREATININE: 1.03 mg/dL — AB (ref 0.57–1.00)
Calcium: 9.5 mg/dL (ref 8.7–10.3)
Chloride: 103 mmol/L (ref 96–106)
GFR calc Af Amer: 60 mL/min/{1.73_m2} (ref >59)
GFR calc non Af Amer: 52 mL/min/{1.73_m2} — ABNORMAL LOW (ref >59)
GLOBULIN, TOTAL: 2.2 g/dL (ref 1.5–4.5)
Glucose: 97 mg/dL (ref 65–99)
POTASSIUM: 4 mmol/L (ref 3.5–5.2)
SODIUM: 142 mmol/L (ref 134–144)
Total Protein: 6.9 g/dL (ref 6.0–8.5)

## 2018-07-28 LAB — LIPID PANEL
CHOL/HDL RATIO: 3.5 ratio (ref 0.0–4.4)
Cholesterol, Total: 151 mg/dL (ref 100–199)
HDL: 43 mg/dL (ref >39)
LDL Calculated: 79 mg/dL (ref 0–99)
Triglycerides: 143 mg/dL (ref 0–149)
VLDL CHOLESTEROL CAL: 29 mg/dL (ref 5–40)

## 2018-07-28 LAB — TSH: TSH: 5.96 u[IU]/mL — AB (ref 0.450–4.500)

## 2018-07-28 LAB — VITAMIN D 25 HYDROXY (VIT D DEFICIENCY, FRACTURES): Vit D, 25-Hydroxy: 41.1 ng/mL (ref 30.0–100.0)

## 2018-07-28 LAB — T4, FREE: FREE T4: 1.13 ng/dL (ref 0.82–1.77)

## 2018-07-28 LAB — VITAMIN B12: Vitamin B-12: 775 pg/mL (ref 232–1245)

## 2018-07-28 LAB — HEMOGLOBIN A1C
Est. average glucose Bld gHb Est-mCnc: 117 mg/dL
Hgb A1c MFr Bld: 5.7 % — ABNORMAL HIGH (ref 4.8–5.6)

## 2018-08-01 LAB — CA 125: CANCER ANTIGEN (CA) 125: 10 U/mL (ref 0.0–38.1)

## 2018-08-01 LAB — SPECIMEN STATUS REPORT

## 2018-08-04 ENCOUNTER — Telehealth: Payer: Self-pay | Admitting: Family Medicine

## 2018-08-04 NOTE — Telephone Encounter (Signed)
Patient is requesting a call back from nurse about labs that were done in our office last week.

## 2018-08-04 NOTE — Telephone Encounter (Signed)
Patient has been notified of lab results. MPulliam, CMA/RT(R)

## 2018-08-11 ENCOUNTER — Ambulatory Visit (HOSPITAL_COMMUNITY)
Admission: RE | Admit: 2018-08-11 | Discharge: 2018-08-11 | Disposition: A | Discharge status: Home / Self Care | Payer: Medicare Other | Source: Ambulatory Visit | Attending: Family | Admitting: Family

## 2018-08-11 ENCOUNTER — Ambulatory Visit: Payer: Medicare Other | Admitting: Family

## 2018-08-11 ENCOUNTER — Other Ambulatory Visit: Payer: Self-pay

## 2018-08-11 ENCOUNTER — Encounter: Payer: Self-pay | Admitting: Family

## 2018-08-11 VITALS — BP 130/65 | HR 69 | Temp 97.3°F | Ht 63.0 in | Wt 169.0 lb

## 2018-08-11 DIAGNOSIS — Z9889 Other specified postprocedural states: Secondary | ICD-10-CM

## 2018-08-11 DIAGNOSIS — I6523 Occlusion and stenosis of bilateral carotid arteries: Secondary | ICD-10-CM | POA: Diagnosis not present

## 2018-08-11 NOTE — Progress Notes (Signed)
Chief Complaint: Follow up Extracranial Carotid Artery Stenosis   History of Present Illness  [name] is a 77 y.o. female who is s/p right carotid endarterectomy in 1998 by Dr. Amedeo Plenty; Dr. Scot Dock has been monitoring pt since Dr. Amedeo Plenty departure. On 02/08/2015 duplex exam she had a patent right carotid endarterectomy site with a 50-69% recurrent stenosis. On the left side it was a less than 49% stenosis. However there was stenosis in the distal common carotid artery on the right greater than 50%. She was set up for a 1 year follow up visit.  She denies any known history of stroke or TIA. Specifically she deniesa history of amaurosis fugax or monocular blindness, unilateral facial drooping, hemiplegia, orreceptive or expressive aphasia.    She denies claudication type symptoms with walking. She does report intermittent right hip pain. She also reports a hx of one ESI that resolved her low back pain.   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   Diabetic: no Tobacco use: former smoker, quit in 1977, started smoking at age 53 years   Pt meds include: Statin : yes ASA: yes, 172 mg daily Other anticoagulants/antiplatelets: no   Past Medical History:  Diagnosis Date  . Arthritis   . Back pain    had cortisone injection 6/15, Dr Durward Fortes  . Carotid artery occlusion   . Carotid bruit   . Diverticulosis   . GERD (gastroesophageal reflux disease)   . Hyperlipidemia   . Hypertension   . Injury of left leg 06/05/12   Pt. fell  . Peripheral vascular disease ([place])   . Thyroid disease 02-27-12   lt. thyroid nodule, bx. done 5 yrs ago-now some enlargement is seen.    Social History Social History   Tobacco Use  . Smoking status: Former Smoker    Last attempt to quit: 09/24/1975    Years since quitting: 42.9  . Smokeless tobacco: Never Used  Substance Use Topics  . Alcohol use: Yes    Alcohol/week: 1.0 - 2.0 standard drinks    Types: 1 - 2 Glasses of wine per  week    Comment: 1-2 drinks a month  . Drug use: No    Family History Family History  Problem Relation Age of Onset  . Heart disease Mother   . Hypertension Mother   . Arthritis Mother   . Stroke Mother   . Hyperlipidemia Mother   . Atrial fibrillation Mother   . Heart disease Brother        before age 12  . Heart attack Neg Hx     Surgical History Past Surgical History:  Procedure Laterality Date  . ABDOMINAL HYSTERECTOMY  01/1980  . CAROTID ENDARTERECTOMY  1998   Stable since surgery -slight build up returning-follows with yrly Dopplers  . CATARACT EXTRACTION, BILATERAL  2004 or 2005 per pt  . COLONOSCOPY    . EYE SURGERY  02-27-12   Bil. Cataract surgery  . THYROID LOBECTOMY  03/02/2012   Procedure: THYROID LOBECTOMY;  Surgeon: Earnstine Regal, MD;  Location: [facility];  Service: General;  Laterality: Left;    Allergies  Allergen Reactions  . Zebeta Other (See Comments)    Does not work, per patient.  . Ace Inhibitors Other (See Comments)    cough    Current Outpatient Medications  Medication Sig Dispense Refill  . acetaminophen (TYLENOL) 500 MG tablet Take 1,000 mg by mouth 2 (two) times daily. For arthritis pain    .  amLODipine (NORVASC) 2.5 MG tablet TAKE 1 TABLET BY MOUTH DAILY WITH BREAKFAST 90 tablet 3  . aspirin 81 MG EC tablet Take 81 mg by mouth daily.     . B Complex Vitamins (VITAMIN-B COMPLEX PO) Take 1 tablet by mouth daily with breakfast.     . Cholecalciferol (VITAMIN D3) 5000 units CAPS Take 1 capsule by mouth daily.     Marland Kitchen docusate sodium (COLACE) 100 MG capsule Take 100 mg by mouth 2 (two) times daily.    . hydrochlorothiazide (HYDRODIURIL) 25 MG tablet Take 1 tablet (25 mg total) by mouth daily. 90 tablet 3  . KRILL OIL PO Take 500 mg by mouth daily. MEGA RED    . levothyroxine (SYNTHROID, LEVOTHROID) 112 MCG tablet Take 1 tablet (112 mcg total) by mouth daily. 90 tablet 3  . Loratadine (CLARITIN) 10 MG CAPS Take 10 mg by mouth as needed (allergies). As  needed    . metoprolol succinate (TOPROL-XL) 100 MG 24 hr tablet TAKE 1 TABLET BY MOUTH DAILY WITH BREAKFAST OR IMMEDIATELY FOLLOWING A MEAL 90 tablet 3  . Multiple Vitamin (MULTIVITAMIN) tablet Take 1 tablet by mouth daily.      . rosuvastatin (CRESTOR) 20 MG tablet Take 1 tablet (20 mg total) by mouth daily with breakfast. 90 tablet 3  . telmisartan (MICARDIS) 80 MG tablet Take 1 tablet (80 mg total) by mouth daily. 90 tablet 3  . verapamil (CALAN-SR) 240 MG CR tablet Take 1 tablet (240 mg total) by mouth daily with breakfast. 90 tablet 3   No current facility-administered medications for this visit.     Review of Systems : See HPI for pertinent positives and negatives.  Physical Examination  Vitals:   08/11/18 1512 08/11/18 1519  BP: (!) 132/59 130/65  Pulse: 69   Temp: (!) 97.3 F (36.3 C)   TempSrc: Oral   SpO2: 97%   Weight: 169 lb (76.7 kg)   Height: 5\' 3"  (1.6 m)    Body mass index is 29.94 kg/m.  General: WDWN female in NAD GAIT: normal Eyes: PERRLA HENT: No gross abnormalities.  Pulmonary:  Respirations are non-labored, good air movement in all fields, CTAB, no rales, rhonchi, or wheezes.  Cardiac: regular rhythm, no detected murmur.  VASCULAR EXAM Carotid Bruits Right Left   Negative Positive     Abdominal aortic pulse is not palpable. Radial pulses are 2+ palpable and equal.                                                                                                                            LE Pulses Right Left       POPLITEAL  not palpable   not palpable       POSTERIOR TIBIAL   palpable    palpable        DORSALIS PEDIS      ANTERIOR TIBIAL  palpable   palpable     Gastrointestinal: soft, nontender, BS WNL,  no r/g, no palpable masses. Musculoskeletal: no muscle atrophy/wasting. M/S 5/5 throughout, extremities without ischemic changes. Skin: No rashes, no ulcers, no cellulitis.   Neurologic:  A&O X 3; appropriate affect, sensation is normal;  speech is normal, CN 2-12 intact, pain and light touch intact in extremities, motor exam as listed above. Psychiatric: Normal thought content, mood appropriate to clinical situation.    Assessment: [name] is a 77 y.o. female is s/p right carotid endarterectomy in 1998. She has no history of stroke or TIA.   Fortunately she does not have DM and quit smoking in 1977, smoked about 18 years. She takes a daily ASA and a statin.  DATA  Carotid Duplex (08-11-18): Right ICA: CEA site with 40-59% stenosis  Left ICA: 40-59% stenosis. Bilateral ECA stenosis.  Bilateral vertebral artery flow is antegrade.  Bilateral subclavian artery waveforms are normal.  No significant change compared to the exams on 02-14-16 and 02-12-17.    Plan: Follow-up in 18 months with Carotid Duplex scan.   I discussed in depth with the patient the nature of atherosclerosis, and emphasized the importance of maximal medical management including strict control of blood pressure, blood glucose, and lipid levels, obtaining regular exercise, and continued cessation of smoking.  The patient is aware that without maximal medical management the underlying atherosclerotic disease process will progress, limiting the benefit of any interventions. The patient was given information about stroke prevention and what symptoms should prompt the patient to seek immediate medical care. Thank you for allowing [place] to participate in this patient's care.  Clemon Chambers, RN, MSN, FNP-C [facility] Office: [phone]  Clinic Physician: Bishop Dublin  08/11/18 3:39 PM

## 2018-08-24 DIAGNOSIS — D2271 Melanocytic nevi of right lower limb, including hip: Secondary | ICD-10-CM | POA: Diagnosis not present

## 2018-08-24 DIAGNOSIS — D225 Melanocytic nevi of trunk: Secondary | ICD-10-CM | POA: Diagnosis not present

## 2018-08-24 DIAGNOSIS — D2262 Melanocytic nevi of left upper limb, including shoulder: Secondary | ICD-10-CM | POA: Diagnosis not present

## 2018-08-24 DIAGNOSIS — L821 Other seborrheic keratosis: Secondary | ICD-10-CM | POA: Diagnosis not present

## 2018-08-24 DIAGNOSIS — L720 Epidermal cyst: Secondary | ICD-10-CM | POA: Diagnosis not present

## 2018-09-14 ENCOUNTER — Other Ambulatory Visit: Payer: Self-pay | Admitting: Interventional Cardiology

## 2018-10-02 ENCOUNTER — Encounter: Payer: Self-pay | Admitting: Interventional Cardiology

## 2018-10-15 NOTE — Progress Notes (Signed)
Cardiology Office Note   Date:  10/23/2018   ID:  [name], DOB [birth year], MRN [id number]  PCP:  Maria Dance, DO    No chief complaint on file.  PAD  Wt Readings from Last 3 Encounters:  10/23/18 171 lb (77.6 kg)  08/11/18 169 lb (76.7 kg)  07/27/18 174 lb 4.8 oz (79.1 kg)       History of Present Illness: [name] is a 78 y.o. female  has a past history of dyslipidemia. She was previously followed by Maria. Mare Pham. She has a history of known residual right carotid bruit after remote right carotid endarterectomy.  She does not have any history of ischemic heart disease. She had a normal nuclear stress test in 2005. She has had essential hypertension and obesity.   Her thyroid disease is followed by her GYN physician. She follows with vascular surgery regarding her carotid disease.  Chronic GERD sx.  The patient is the caregiver for her husband, Maria. Nolene Pham, who has metastatic prostate cancer.  He recently stopped treatment for prostate cancer and went on hospice care.  PSA was 487.  He survived a bout of pneumonia recently. He has other help at home and needs to be moved with a Harrel Lemon.  The patient has not had cardiac symptoms.    Denies : Chest pain. Dizziness. Leg edema. Nitroglycerin use. Orthopnea. Palpitations. Paroxysmal nocturnal dyspnea. Shortness of breath. Syncope.   She exercises some in addition to caring for her husband.  She uses the treadmill.  She feels well.    Past Medical History:  Diagnosis Date  . Arthritis   . Back pain    had cortisone injection 6/15, [name]  . Carotid artery occlusion   . Carotid bruit   . Diverticulosis   . GERD (gastroesophageal reflux disease)   . Hyperlipidemia   . Hypertension   . Injury of left leg 06/05/12   Pt. fell  . Peripheral vascular disease ([place])   . Thyroid disease 02-27-12   lt. thyroid nodule, bx. done 5 yrs ago-now some enlargement is seen.    Past Surgical History:    Procedure Laterality Date  . ABDOMINAL HYSTERECTOMY  01/1980  . CAROTID ENDARTERECTOMY  1998   Stable since surgery -slight build up returning-follows with yrly Dopplers  . CATARACT EXTRACTION, BILATERAL  2004 or 2005 per pt  . COLONOSCOPY    . EYE SURGERY  02-27-12   Bil. Cataract surgery  . THYROID LOBECTOMY  03/02/2012   Procedure: THYROID LOBECTOMY;  Surgeon: Maria Regal, MD;  Location: [facility];  Service: General;  Laterality: Left;     Current Outpatient Medications  Medication Sig Dispense Refill  . acetaminophen (TYLENOL) 500 MG tablet Take 1,000 mg by mouth 2 (two) times daily. For arthritis pain    . amLODipine (NORVASC) 2.5 MG tablet TAKE 1 TABLET BY MOUTH DAILY WITH BREAKFAST 90 tablet 1  . aspirin 81 MG EC tablet Take 81 mg by mouth daily.     . B Complex Vitamins (VITAMIN-B COMPLEX PO) Take 1 tablet by mouth daily with breakfast.     . Cholecalciferol (VITAMIN D3) 5000 units CAPS Take 1 capsule by mouth daily.     Marland Kitchen docusate sodium (COLACE) 100 MG capsule Take 100 mg by mouth 2 (two) times daily.    . hydrochlorothiazide (HYDRODIURIL) 25 MG tablet Take 1 tablet (25 mg total) by mouth daily. 90 tablet 3  . KRILL OIL PO Take 500 mg  by mouth daily. MEGA RED    . levothyroxine (SYNTHROID, LEVOTHROID) 112 MCG tablet Take 1 tablet (112 mcg total) by mouth daily. 90 tablet 3  . Loratadine (CLARITIN) 10 MG CAPS Take 10 mg by mouth as needed (allergies). As needed    . metoprolol succinate (TOPROL-XL) 100 MG 24 hr tablet TAKE 1 TABLET BY MOUTH DAILY WITH BREAKFAST OR IMMEDIATELY FOLLOWING A MEAL 90 tablet 3  . Multiple Vitamin (MULTIVITAMIN) tablet Take 1 tablet by mouth daily.      . rosuvastatin (CRESTOR) 20 MG tablet Take 1 tablet (20 mg total) by mouth daily with breakfast. 90 tablet 3  . telmisartan (MICARDIS) 80 MG tablet Take 1 tablet (80 mg total) by mouth daily. 90 tablet 3  . verapamil (CALAN-SR) 240 MG CR tablet Take 1 tablet (240 mg total) by mouth daily with breakfast. 90  tablet 3   No current facility-administered medications for this visit.     Allergies:   Zebeta and Ace inhibitors    Social History:  The patient  reports that she quit smoking about 43 years ago. She has never used smokeless tobacco. She reports current alcohol use of about 1.0 - 2.0 standard drinks of alcohol per week. She reports that she does not use drugs.   Family History:  The patient's family history includes Arthritis in her mother; Atrial fibrillation in her mother; Heart disease in her brother and mother; Hyperlipidemia in her mother; Hypertension in her mother; Stroke in her mother.    ROS:  Please see the history of present illness.   Otherwise, review of systems are positive for stress with husband.   All other systems are reviewed and negative.    PHYSICAL EXAM: VS:  BP 126/70   Pulse 61   Ht 5\' 3"  (1.6 m)   Wt 171 lb (77.6 kg)   LMP 09/24/1979   SpO2 94%   BMI 30.29 kg/m  , BMI Body mass index is 30.29 kg/m. GEN: Well nourished, well developed, in no acute distress  HEENT: normal  Neck: no JVD,; bilateral carotid bruits R>L; no masses Cardiac: RRR; no murmurs, rubs, or gallops,no edema  Respiratory:  clear to auscultation bilaterally, normal work of breathing GI: soft, nontender, nondistended, + BS MS: no deformity or atrophy  Skin: warm and dry, no rash Neuro:  Strength and sensation are intact Psych: euthymic mood, full affect   EKG:   The ekg ordered today demonstrates NSR, no ST changes   Recent Labs: 07/27/2018: ALT 30; BUN 19; Creatinine, Ser 1.03; Hemoglobin 12.5; Platelets 356; Potassium 4.0; Sodium 142; TSH 5.960   Lipid Panel    Component Value Date/Time   CHOL 151 07/27/2018 1004   TRIG 143 07/27/2018 1004   HDL 43 07/27/2018 1004   CHOLHDL 3.5 07/27/2018 1004   CHOLHDL 3.9 08/26/2016 1035   VLDL 32 (H) 08/26/2016 1035   LDLCALC 79 07/27/2018 1004   LDLDIRECT 73.4 01/07/2013 0939     Other studies Reviewed: Additional studies/  records that were reviewed today with results demonstrating: labs reviewed.   ASSESSMENT AND PLAN:  1. Hypertensive heart disease: Bp controlled. COntinue current meds. 2. Hyperlipidemia: Lipids controled in 11/19.  COntinue statin.  3. Carotid artery disease: Routine Dopplers, last in 11/19.  Bilateral bruits.  Continue Crestor.   4. GERD: resolved.  Not taking Zantac.  5. Spoke at length about her husband.  She is trying to take care of herself through the stress.    Current medicines are  reviewed at length with the patient today.  The patient concerns regarding her medicines were addressed.  The following changes have been made:  No change  Labs/ tests ordered today include:  No orders of the defined types were placed in this encounter.   Recommend 150 minutes/week of aerobic exercise Low fat, low carb, high fiber diet recommended  Disposition:   FU in 6 months   Signed, Larae Grooms, MD  10/23/2018 11:00 AM    [facility] [place], [place], [place]  [address] Phone: [phone]; Fax: [phone]

## 2018-10-23 ENCOUNTER — Encounter: Payer: Self-pay | Admitting: Interventional Cardiology

## 2018-10-23 ENCOUNTER — Ambulatory Visit: Payer: Medicare Other | Admitting: Interventional Cardiology

## 2018-10-23 VITALS — BP 126/70 | HR 61 | Ht 63.0 in | Wt 171.0 lb

## 2018-10-23 DIAGNOSIS — E785 Hyperlipidemia, unspecified: Secondary | ICD-10-CM | POA: Diagnosis not present

## 2018-10-23 DIAGNOSIS — I6523 Occlusion and stenosis of bilateral carotid arteries: Secondary | ICD-10-CM

## 2018-10-23 DIAGNOSIS — K219 Gastro-esophageal reflux disease without esophagitis: Secondary | ICD-10-CM | POA: Diagnosis not present

## 2018-10-23 DIAGNOSIS — I119 Hypertensive heart disease without heart failure: Secondary | ICD-10-CM

## 2018-10-23 MED ORDER — VERAPAMIL HCL ER 240 MG PO TBCR: 240.0000 mg | EXTENDED_RELEASE_TABLET | Freq: Every day | ORAL | 3 refills | Status: DC

## 2018-10-23 MED ORDER — ROSUVASTATIN CALCIUM 20 MG PO TABS: 20.0000 mg | ORAL_TABLET | Freq: Every day | ORAL | 3 refills | Status: DC

## 2018-10-23 MED ORDER — METOPROLOL SUCCINATE ER 100 MG PO TB24: ORAL_TABLET | ORAL | 3 refills | Status: DC

## 2018-10-23 MED ORDER — TELMISARTAN 80 MG PO TABS: 80.0000 mg | ORAL_TABLET | Freq: Every day | ORAL | 3 refills | Status: DC

## 2018-10-23 MED ORDER — AMLODIPINE BESYLATE 2.5 MG PO TABS: 2.5000 mg | ORAL_TABLET | Freq: Every day | ORAL | 3 refills | Status: DC

## 2018-10-23 MED ORDER — HYDROCHLOROTHIAZIDE 25 MG PO TABS: 25.0000 mg | ORAL_TABLET | Freq: Every day | ORAL | 3 refills | Status: DC

## 2018-10-23 NOTE — Patient Instructions (Signed)

## 2018-11-24 ENCOUNTER — Encounter: Payer: Self-pay | Admitting: Obstetrics & Gynecology

## 2018-11-24 ENCOUNTER — Other Ambulatory Visit (HOSPITAL_COMMUNITY)
Admission: RE | Admit: 2018-11-24 | Discharge: 2018-11-24 | Disposition: A | Discharge status: Home / Self Care | Payer: Medicare Other | Source: Ambulatory Visit | Attending: Obstetrics & Gynecology | Admitting: Obstetrics & Gynecology

## 2018-11-24 ENCOUNTER — Ambulatory Visit (INDEPENDENT_AMBULATORY_CARE_PROVIDER_SITE_OTHER): Payer: Medicare Other | Admitting: Obstetrics & Gynecology

## 2018-11-24 ENCOUNTER — Other Ambulatory Visit: Payer: Self-pay

## 2018-11-24 VITALS — BP 128/60 | HR 60 | Resp 16 | Ht 62.0 in | Wt 170.0 lb

## 2018-11-24 DIAGNOSIS — Z01419 Encounter for gynecological examination (general) (routine) without abnormal findings: Secondary | ICD-10-CM

## 2018-11-24 DIAGNOSIS — Z0000 Encounter for general adult medical examination without abnormal findings: Secondary | ICD-10-CM

## 2018-11-24 DIAGNOSIS — Z124 Encounter for screening for malignant neoplasm of cervix: Secondary | ICD-10-CM | POA: Insufficient documentation

## 2018-11-24 LAB — POCT URINALYSIS DIPSTICK
Bilirubin, UA: NEGATIVE
Glucose, UA: NEGATIVE
Ketones, UA: NEGATIVE
NITRITE UA: NEGATIVE
PH UA: 5 (ref 5.0–8.0)
PROTEIN UA: NEGATIVE
RBC UA: NEGATIVE
UROBILINOGEN UA: 0.2 U/dL

## 2018-11-24 NOTE — Progress Notes (Signed)
78 y.o. G2P2 Married White or Caucasian female here for annual exam.  She did have vascular appointment in November.  Dopplers are to be repeated in 18 months.  Denies vaginal bleeding.    Husband is under hospice care due to metastatic prostate cancer.  She has a lot of care   PCP:  Dr. Raliegh Scarlet.  Having blood work with her.    Patient's last menstrual period was 09/24/1979.          Sexually active: No.  The current method of family planning is post menopausal status.    Exercising: No.   Smoker:  no  Health Maintenance: Pap:  2012 Normal  History of abnormal Pap:  no MMG:  03/03/18 BIRADS1:Neg Colonoscopy:  2014 normal  BMD:   02/05/13 Osteopenia  TDaP:  2012 Pneumonia vaccine(s):  2016 Shingrix:  Zostavax Hep C testing: n/a Screening Labs: PCP  UA: WBC=Trace    reports that she quit smoking about 43 years ago. She has never used smokeless tobacco. She reports current alcohol use of about 1.0 standard drinks of alcohol per week. She reports that she does not use drugs.  Past Medical History:  Diagnosis Date  . Arthritis   . Back pain    had cortisone injection 6/15, Dr Durward Fortes  . Carotid artery occlusion   . Carotid bruit   . Diverticulosis   . GERD (gastroesophageal reflux disease)   . Hyperlipidemia   . Hypertension   . Injury of left leg 06/05/12   Pt. fell  . Peripheral vascular disease ([place])   . Shingles outbreak 2017  . Thyroid disease 02-27-12   lt. thyroid nodule, bx. done 5 yrs ago-now some enlargement is seen.    Past Surgical History:  Procedure Laterality Date  . ABDOMINAL HYSTERECTOMY  01/1980  . CAROTID ENDARTERECTOMY  1998   Stable since surgery -slight build up returning-follows with yrly Dopplers  . CATARACT EXTRACTION, BILATERAL  2004 or 2005 per pt  . COLONOSCOPY    . EYE SURGERY  02-27-12   Bil. Cataract surgery  . THYROID LOBECTOMY  03/02/2012   Procedure: THYROID LOBECTOMY;  Surgeon: Earnstine Regal, MD;  Location: [facility];  Service: General;   Laterality: Left;    Current Outpatient Medications  Medication Sig Dispense Refill  . acetaminophen (TYLENOL) 500 MG tablet Take 1,000 mg by mouth 2 (two) times daily. For arthritis pain    . amLODipine (NORVASC) 2.5 MG tablet Take 1 tablet (2.5 mg total) by mouth daily with breakfast. 90 tablet 3  . aspirin 81 MG EC tablet Take 81 mg by mouth daily.     . B Complex Vitamins (VITAMIN-B COMPLEX PO) Take 1 tablet by mouth daily with breakfast.     . Cholecalciferol (VITAMIN D3) 5000 units CAPS Take 1 capsule by mouth daily.     Marland Kitchen docusate sodium (COLACE) 100 MG capsule Take 100 mg by mouth 2 (two) times daily.    . hydrochlorothiazide (HYDRODIURIL) 25 MG tablet Take 1 tablet (25 mg total) by mouth daily. 90 tablet 3  . KRILL OIL PO Take 500 mg by mouth daily. MEGA RED    . levothyroxine (SYNTHROID, LEVOTHROID) 112 MCG tablet Take 1 tablet (112 mcg total) by mouth daily. 90 tablet 3  . Loratadine (CLARITIN) 10 MG CAPS Take 10 mg by mouth as needed (allergies). As needed    . metoprolol succinate (TOPROL-XL) 100 MG 24 hr tablet TAKE 1 TABLET BY MOUTH DAILY WITH BREAKFAST OR IMMEDIATELY FOLLOWING A  MEAL 90 tablet 3  . Multiple Vitamin (MULTIVITAMIN) tablet Take 1 tablet by mouth daily.      . rosuvastatin (CRESTOR) 20 MG tablet Take 1 tablet (20 mg total) by mouth daily with breakfast. 90 tablet 3  . telmisartan (MICARDIS) 80 MG tablet Take 1 tablet (80 mg total) by mouth daily. 90 tablet 3  . verapamil (CALAN-SR) 240 MG CR tablet Take 1 tablet (240 mg total) by mouth daily with breakfast. 90 tablet 3   No current facility-administered medications for this visit.     Family History  Problem Relation Age of Onset  . Heart disease Mother   . Hypertension Mother   . Arthritis Mother   . Stroke Mother   . Hyperlipidemia Mother   . Atrial fibrillation Mother   . Heart disease Brother        before age 59  . Heart attack Neg Hx     Review of Systems  Genitourinary:       Loss of urine  spontaneously  Loss of urine with sneeze or cough   Hematological: Bruises/bleeds easily.  All other systems reviewed and are negative.   Exam:   BP 128/60 (BP Location: Left Arm, Patient Position: Sitting, Cuff Size: Large)   Pulse 60   Resp 16   Ht 5\' 2"  (1.575 m)   Wt 170 lb (77.1 kg)   LMP 09/24/1979   BMI 31.09 kg/m   Height: 5\' 2"  (157.5 cm)  Ht Readings from Last 3 Encounters:  11/24/18 5\' 2"  (1.575 m)  10/23/18 5\' 3"  (1.6 m)  08/11/18 5\' 3"  (1.6 m)    General appearance: alert, cooperative and appears stated age Head: Normocephalic, without obvious abnormality, atraumatic Neck: no adenopathy, supple, symmetrical, trachea midline and thyroid normal to inspection and palpation Lungs: clear to auscultation bilaterally Breasts: normal appearance, no masses or tenderness Heart: regular rate and rhythm Abdomen: soft, non-tender; bowel sounds normal; no masses,  no organomegaly Extremities: extremities normal, atraumatic, no cyanosis or edema Skin: Skin color, texture, turgor normal. No rashes or lesions Lymph nodes: Cervical, supraclavicular, and axillary nodes normal. No abnormal inguinal nodes palpated Neurologic: Grossly normal   Pelvic: External genitalia:  no lesions              Urethra:  normal appearing urethra with no masses, tenderness or lesions              Bartholins and Skenes: normal                 Vagina: normal appearing vagina with normal color and discharge, no lesions              Cervix: absent              Pap taken: Yes.   Bimanual Exam:  Uterus:  uterus absent              Adnexa: no mass, fullness, tenderness               Rectovaginal: Confirms               Anus:  normal sphincter tone, no lesions  Chaperone was present for exam.  A:  Well Woman with normal exam PMP, off HRT H/o TVH, ovaries remain Diverticulosis with h/o chronic LLQ pain (h/o soft tissue massed noted on ultrasound in 2008.   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  Still doing yearly. pap smear obtained per pt request Lab work done with Dr. Raliegh Scarlet Not interested in Shingrix vaccination at this time Consider BMD with next MMG return annually or prn

## 2018-11-25 LAB — CYTOLOGY - PAP: Diagnosis: NEGATIVE

## 2019-01-22 ENCOUNTER — Other Ambulatory Visit: Payer: Self-pay | Admitting: Obstetrics & Gynecology

## 2019-01-22 ENCOUNTER — Other Ambulatory Visit: Payer: Self-pay | Admitting: Family Medicine

## 2019-01-22 DIAGNOSIS — Z1231 Encounter for screening mammogram for malignant neoplasm of breast: Secondary | ICD-10-CM

## 2019-01-22 DIAGNOSIS — E038 Other specified hypothyroidism: Secondary | ICD-10-CM

## 2019-02-16 ENCOUNTER — Other Ambulatory Visit: Payer: Self-pay

## 2019-02-16 ENCOUNTER — Other Ambulatory Visit (INDEPENDENT_AMBULATORY_CARE_PROVIDER_SITE_OTHER): Payer: Medicare Other

## 2019-02-16 DIAGNOSIS — E890 Postprocedural hypothyroidism: Secondary | ICD-10-CM | POA: Diagnosis not present

## 2019-02-16 DIAGNOSIS — I119 Hypertensive heart disease without heart failure: Secondary | ICD-10-CM

## 2019-02-16 DIAGNOSIS — R7309 Other abnormal glucose: Secondary | ICD-10-CM | POA: Diagnosis not present

## 2019-02-17 ENCOUNTER — Encounter: Payer: Self-pay | Admitting: Family Medicine

## 2019-02-17 ENCOUNTER — Ambulatory Visit (INDEPENDENT_AMBULATORY_CARE_PROVIDER_SITE_OTHER): Payer: Medicare Other | Admitting: Family Medicine

## 2019-02-17 VITALS — BP 146/62 | HR 66 | Ht 62.0 in | Wt 175.0 lb

## 2019-02-17 DIAGNOSIS — E785 Hyperlipidemia, unspecified: Secondary | ICD-10-CM

## 2019-02-17 DIAGNOSIS — R7302 Impaired glucose tolerance (oral): Secondary | ICD-10-CM | POA: Insufficient documentation

## 2019-02-17 DIAGNOSIS — I119 Hypertensive heart disease without heart failure: Secondary | ICD-10-CM | POA: Diagnosis not present

## 2019-02-17 DIAGNOSIS — Z636 Dependent relative needing care at home: Secondary | ICD-10-CM | POA: Diagnosis not present

## 2019-02-17 DIAGNOSIS — E038 Other specified hypothyroidism: Secondary | ICD-10-CM | POA: Insufficient documentation

## 2019-02-17 DIAGNOSIS — E890 Postprocedural hypothyroidism: Secondary | ICD-10-CM

## 2019-02-17 LAB — TSH: TSH: 5.19 u[IU]/mL — ABNORMAL HIGH (ref 0.450–4.500)

## 2019-02-17 LAB — BASIC METABOLIC PANEL
BUN/Creatinine Ratio: 19 (ref 12–28)
BUN: 18 mg/dL (ref 8–27)
CO2: 23 mmol/L (ref 20–29)
Calcium: 9.3 mg/dL (ref 8.7–10.3)
Chloride: 100 mmol/L (ref 96–106)
Creatinine, Ser: 0.94 mg/dL (ref 0.57–1.00)
GFR calc Af Amer: 67 mL/min/{1.73_m2} (ref >59)
GFR calc non Af Amer: 58 mL/min/{1.73_m2} — ABNORMAL LOW (ref >59)
Glucose: 91 mg/dL (ref 65–99)
Potassium: 4.7 mmol/L (ref 3.5–5.2)
Sodium: 138 mmol/L (ref 134–144)

## 2019-02-17 LAB — T4, FREE: Free T4: 1.12 ng/dL (ref 0.82–1.77)

## 2019-02-17 LAB — T3, FREE: T3, Free: 2.7 pg/mL (ref 2.0–4.4)

## 2019-02-17 LAB — HEMOGLOBIN A1C
Est. average glucose Bld gHb Est-mCnc: 114 mg/dL
Hgb A1c MFr Bld: 5.6 % (ref 4.8–5.6)

## 2019-02-17 MED ORDER — LEVOTHYROXINE SODIUM 112 MCG PO TABS: 112.0000 ug | ORAL_TABLET | Freq: Every day | ORAL | 1 refills | Status: DC

## 2019-02-17 NOTE — Progress Notes (Signed)
Telehealth office visit note for [name], D.O- at Primary Care at [facility]   I connected with current patient today and verified that I am speaking with the correct person using two identifiers.   . Location of the patient: Home . Location of the provider: Office Only the patient (+/- their family members at pt's discretion) and myself were participating in the encounter    - This visit type was conducted due to national recommendations for restrictions regarding the COVID-19 Pandemic (e.g. social distancing) in an effort to limit this patient's exposure and mitigate transmission in our community.  This format is felt to be most appropriate for this patient at this time.   - The patient did not have access to video technology or had technical difficulties with video requiring transitioning to audio format only. - No physical exam could be performed with this format, beyond that communicated to [place] by the patient/ family members as noted.   - Additionally my office staff/ schedulers discussed with the patient that there may be a monetary charge related to this service, depending on their medical insurance.   The patient expressed understanding, and agreed to proceed.       History of Present Illness:  Last OV 07/2018.   -Some repeat labs were obtained recently.  Overall all have improved.  Pt having alot of stress taking care of sick husband/ [place].  But managing well.  He is on hospice care and they have 2 different women coming into help with his care most days and nights of the week.  However he is in a hospital bed at home and patient has to use a Hoyer lift to move him etc.  It is taxing on her and she gets little time alone to herself.  - Daughter who lives in [place] and has summer home [facility] [place]. - nearby now- see's here.  Other daughter in [place] her as well. .   Prediabetes:: 1 year ago A1c was 5.7 now most recently at 5.6.  Thyroid- no complaints of  fatigue etc. she is taking the medicines as prescribed daily.  She denies cold intolerance, feeling sluggish and tired, hair loss skin abnormalities etc. her TSH is only slightly up but actually improved from prior.  T3 and T4 are both normal.  BP:  runs around 130's-140's/ 60-70's.  Well controlled per pt,  Asx.  Hyperlipidemia:   Last checked labs look fantastic versus prior.  Told her we did not check in the last time but we will check yearly-in another 6 months.  He is not doing any exercise on her own but is eating healthy overall.   Hemoglobin A1c     Status: None   Collection Time: 02/16/19 10:43 AM  Result Value Ref Range   Hgb A1c MFr Bld 5.6 ( 5.7  30mo ago)  4.8 - 5.6 %    Comment:          Prediabetes: 5.7 - 6.4          Diabetes: >6.4          Glycemic control for adults with diabetes: <7.0    Est. average glucose Bld gHb Est-mCnc 114 mg/dL  TSH     Status: Abnormal   Collection Time: 02/16/19 10:43 AM  Result Value Ref Range   TSH 5.190 (H) ( 5.960 - 67mo ago) 0.450 - 4.500 uIU/mL  T4, free     Status: None   Collection  Time: 02/16/19 10:43 AM  Result Value Ref Range   Free T4 1.12 0.82 - 1.77 ng/dL  Basic Metabolic Panel (BMET)     Status: Abnormal   Collection Time: 02/16/19 10:43 AM  Result Value Ref Range   Glucose 91 65 - 99 mg/dL   BUN 18 8 - 27 mg/dL   Creatinine, Ser 0.94 0.57 - 1.00 mg/dL   GFR calc non Af Amer 58 (L) >59 mL/min/1.73   GFR calc Af Amer 67 >59 mL/min/1.73   BUN/Creatinine Ratio 19 12 - 28   Sodium 138 134 - 144 mmol/L   Potassium 4.7 3.5 - 5.2 mmol/L   Chloride 100 96 - 106 mmol/L   CO2 23 20 - 29 mmol/L   Calcium 9.3 8.7 - 10.3 mg/dL  T3, free     Status: None   Collection Time: 02/16/19 10:43 AM  Result Value Ref Range   T3, Free 2.7 2.0 - 4.4 pg/mL     Impression and Recommendations:    1. Hypothyroidism, postsurgical   2. Dyslipidemia- low HDL and inc VLDL & TG   3. Glucose intolerance (impaired glucose tolerance)   4. Benign  hypertensive heart disease without heart failure   5. Caregiver stress     -Current dose of thyroid medicines as patient is asymptomatic and feels well and labs are consistent with subclinical hypothyroidism. -We will keep a close eye on her thyroid levels and recheck in 6 months   -For her hyperlipidemia she will be due in 6 months.  Last check they were under control so continue current medicines.  We discussed diet lifestyle modifications in addition to meds.  to follow-up with cardiology as scheduled  -A1c has normalized now.  Discussed with patient strategies to continue to keep that within normal limits.  -Blood pressure is under good control especially for age 50.  Medications are managed by her cardiologist per patient's preference  We discussed ways to try to minimize caregiver stress, taking time for self, exercise, meditation, prayer etc.  - As part of my medical decision making, I reviewed the following data within the [place] History obtained from pt /family, CMA notes reviewed and incorporated if applicable, Labs reviewed, Radiograph/ tests reviewed if applicable and OV notes from prior OV's with me, as well as other specialists she/he has seen since seeing me last, were all reviewed and used in my medical decision making process today.   - Additionally, discussion had with patient regarding txmnt plan, and their biases/concerns about that plan were used in my medical decision making today.   - The patient agreed with the plan and demonstrated an understanding of the instructions.   No barriers to understanding were identified.   - Red flag symptoms and signs discussed in detail.  Patient expressed understanding regarding what to do in case of emergency\ urgent symptoms.  The patient was advised to call back or seek an in-person evaluation if the symptoms worsen or if the condition fails to improve as anticipated.   Return for 6 months for Medicare wellness(and  FBW as desired by patient).    No orders of the defined types were placed in this encounter.   Meds ordered this encounter  Medications  . levothyroxine (SYNTHROID) 112 MCG tablet    Sig: Take 1 tablet (112 mcg total) by mouth daily.    Dispense:  90 tablet    Refill:  1    Medications Discontinued During This Encounter  Medication Reason  .  levothyroxine (SYNTHROID) 112 MCG tablet Reorder      I provided 22 minutes of non-face-to-face time during this encounter,with over 50% of the time in direct counseling on patients medical conditions/ medical concerns.  Additional time was spent with charting and coordination of care after the actual visit commenced.   Note:  This note was prepared with assistance of Dragon voice recognition software. Occasional wrong-word or sound-a-like substitutions may have occurred due to the inherent limitations of voice recognition software.  Maria Dance, DO     Patient Care Team    Relationship Specialty Notifications Start End  Maria Dance, DO PCP - General Family Medicine  05/10/16   Jettie Booze, MD Consulting Physician Cardiology  02/14/16   Garald Balding, MD Consulting Physician Orthopedic Surgery  06/04/16   Martinique, Amy, MD Consulting Physician Dermatology  06/04/16   Angelia Mould, MD Consulting Physician Vascular Surgery  06/07/16    Comment: follows carotids;    Sees Nickel, Vinnie Level, NP  Ronald Lobo, MD Consulting Physician Gastroenterology  06/24/17    Comment: went for colonoscopy     -Vitals obtained; medications/ allergies reconciled;  personal medical, social, Sx etc.histories were updated by CMA, reviewed by me and are reflected in chart   Patient Active Problem List   Diagnosis Date Noted  . Obesity, Class I, BMI 30-34.9 09/30/2016    Priority: High  . Bilateral carotid artery disease ([place]) 06/10/2012    Priority: High  . Dyslipidemia- low HDL and inc VLDL & TG 04/04/2011    Priority: High   . Benign hypertensive heart disease without heart failure 04/04/2011    Priority: High  . Glucose intolerance (impaired glucose tolerance) 02/17/2019    Priority: Medium  . Right carotid bruit 06/07/2016    Priority: Medium  . GERD (gastroesophageal reflux disease) 06/07/2016    Priority: Medium  . Moderate osteopenia 06/07/2016    Priority: Medium  . Hypothyroidism, postsurgical 05/05/2012    Priority: Medium  . Vitamin D deficiency 09/30/2016    Priority: Low  . h/o Diverticulosis 06/07/2016    Priority: Low  . Environmental and seasonal allergies 06/07/2016    Priority: Low  . Arthritis 06/07/2016    Priority: Low  . Other specified hypothyroidism 02/17/2019  . Caregiver stress 02/17/2019  . Caregiver stress syndrome 07/27/2018     Current Meds  Medication Sig  . acetaminophen (TYLENOL) 500 MG tablet Take 1,000 mg by mouth 2 (two) times daily. For arthritis pain  . amLODipine (NORVASC) 2.5 MG tablet Take 1 tablet (2.5 mg total) by mouth daily with breakfast.  . aspirin 81 MG EC tablet Take 81 mg by mouth daily.   . B Complex Vitamins (VITAMIN-B COMPLEX PO) Take 1 tablet by mouth daily with breakfast.   . Cholecalciferol (VITAMIN D3) 5000 units CAPS Take 1 capsule by mouth daily.   Marland Kitchen docusate sodium (COLACE) 100 MG capsule Take 100 mg by mouth 2 (two) times daily.  . hydrochlorothiazide (HYDRODIURIL) 25 MG tablet Take 1 tablet (25 mg total) by mouth daily.  Marland Kitchen KRILL OIL PO Take 500 mg by mouth daily. MEGA RED  . levothyroxine (SYNTHROID) 112 MCG tablet Take 1 tablet (112 mcg total) by mouth daily.  . Loratadine (CLARITIN) 10 MG CAPS Take 10 mg by mouth as needed (allergies). As needed  . metoprolol succinate (TOPROL-XL) 100 MG 24 hr tablet TAKE 1 TABLET BY MOUTH DAILY WITH BREAKFAST OR IMMEDIATELY FOLLOWING A MEAL  . Multiple Vitamin (MULTIVITAMIN) tablet Take 1  tablet by mouth daily.    . rosuvastatin (CRESTOR) 20 MG tablet Take 1 tablet (20 mg total) by mouth daily with  breakfast.  . telmisartan (MICARDIS) 80 MG tablet Take 1 tablet (80 mg total) by mouth daily.  . verapamil (CALAN-SR) 240 MG CR tablet Take 1 tablet (240 mg total) by mouth daily with breakfast.  . [DISCONTINUED] levothyroxine (SYNTHROID) 112 MCG tablet TAKE 1 TABLET BY MOUTH DAILY     Allergies:  Allergies  Allergen Reactions  . Zebeta Other (See Comments)    Does not work, per patient.  . Ace Inhibitors Other (See Comments)    cough     ROS:  See above HPI for pertinent positives and negatives   Objective:   Blood pressure (!) 146/62, pulse 66, height 5\' 2"  (1.575 m), weight 175 lb (79.4 kg), last menstrual period 09/24/1979.  (if some vitals are omitted, this means that patient was UNABLE to obtain them even though they were asked to get them prior to OV today.  They were asked to call us at their earliest convenience with these once obtained. )  General: A & O * 3; sounds in no acute distress; in usual state of health.  Skin: Pt confirms warm and dry extremities and pink fingertips HEENT: Pt confirms lips non-cyanotic Chest: Patient confirms normal chest excursion and movement Respiratory: speaking in full sentences, no conversational dyspnea; patient confirms no use of accessory muscles Psych: insight appears good, mood- appears full

## 2019-03-19 ENCOUNTER — Ambulatory Visit
Admission: RE | Admit: 2019-03-19 | Discharge: 2019-03-19 | Disposition: A | Discharge status: Home / Self Care | Payer: Medicare Other | Source: Ambulatory Visit | Attending: Obstetrics & Gynecology | Admitting: Obstetrics & Gynecology

## 2019-03-19 DIAGNOSIS — Z1231 Encounter for screening mammogram for malignant neoplasm of breast: Secondary | ICD-10-CM | POA: Diagnosis not present

## 2019-03-22 ENCOUNTER — Telehealth: Payer: Self-pay

## 2019-03-22 ENCOUNTER — Telehealth: Payer: Self-pay | Admitting: Family Medicine

## 2019-03-22 NOTE — Telephone Encounter (Signed)
Called and spoke to the patient's daughter Earnest Bailey and advised that Dr. Raliegh Scarlet recommends ER or [facility] to [facility] CAD as they will be able to do labs and imaging needed.  She expressed understanding. MPulliam, CMA/RT(R)

## 2019-03-22 NOTE — Telephone Encounter (Signed)
Patient called and states that she is dizzy and has nausea; no vomiting.  Patient denies chest pain and SOB.  BP reading was 174/69 and repeat was 206/12.  Patient advised to call 911 for emergency treatment and transport to the ER.  Advised patient multiple times that it is best for her to seek immediate care at the ER as her BP numbers and symptoms could be stroke related. MPulliam, CMA/RT(R)

## 2019-03-22 NOTE — Telephone Encounter (Signed)
Patient followed the advice of Melissa and called 911 for abnormally high BP. EMS did come to the house and got a BP of 170/80 and a normal EKG. She elected to not continue care at hospital as she believes this all stemmed from a stomach issue. Patient states you can contact her at home for any f/u questions.

## 2019-03-23 ENCOUNTER — Telehealth: Payer: Self-pay | Admitting: Interventional Cardiology

## 2019-03-23 ENCOUNTER — Telehealth: Payer: Self-pay | Admitting: Family Medicine

## 2019-03-23 NOTE — Telephone Encounter (Signed)
Pt c/o BP issue: STAT if pt c/o blurred vision, one-sided weakness or slurred speech  1. What are your last 5 BP readings? Yesterday it was  226/126- she had a lot of Vertigo, nauseated- called EMS- when they arrived it was 180/80 and 10 minutes later it was 170/80- primary doctor wants her to see Dr Irish Lack asap- I made her an appt for tomorrow with Vin, is this ok?  2. Are you having any other symptoms (ex. Dizziness, headache, blurred vision, passed out)? A lot of dizziness  3. What is your BP issue? High blood pressure, today it is 155/71

## 2019-03-23 NOTE — Telephone Encounter (Signed)
Called and spoke to patient. She states that yesterday she was helping her husband and turned around real quick and felt dizzy and nauseous. She states that she started burping and vomited x 1. She states that her BP was significantly elevated at 226/126. She states that she called EMS and when they arrived it was 180/80 and 10 minutes later it was 170/80. Denies having changes in vision, speech, or strength, chest pain, SOB, syncope, or any other Sx. She states that her BP this morning was 184/80 and 2 hours after meds 155/71. She states that her HR is typically in the 70s. She states that she is taking her meds as prescribed. Patient requesting an appointment to discuss medications and BP control. Patient will come in th eoffice tomorrow. Patient answers no to all screening questions below. Patient understands that there are no visitors allowed. Patient understands not to arrive more than 15 minutes prior to the scheduled appointment time and that a mask will be required for the visit.       COVID-19 Pre-Screening Questions:  . In the past 7 to 10 days have you had a cough,  shortness of breath, headache, congestion, fever (100 or greater) body aches, chills, sore throat, or sudden loss of taste or sense of smell? NO . Have you been around anyone with known Covid 19? NO . Have you been around anyone who is awaiting Covid 19 test results in the past 7 to 10 days? NO . Have you been around anyone who has been exposed to Covid 19, or has mentioned symptoms of Covid 19 within the past 7 to 10 days? NO

## 2019-03-23 NOTE — Telephone Encounter (Signed)
Spoke to patient and advised.  Patient states that she is still symptom free.  Patient has appointment with Cardiology tomorrow morning. MPulliam, CMA/RT(R)

## 2019-03-23 NOTE — Telephone Encounter (Signed)
Again this is something that I feel very well could have been cardiac in nature due to the increase in blood pressure along with dizziness, some nausea vomiting and now heartburn that again further goes along with this being a cardiac etiology.    If she is no longer having any of these symptoms and her blood pressure has normalized, then I recommend she call her cardiologist and see what they would advise from this point forward.  Labs would be determined by them not [place] for this acute issue

## 2019-03-23 NOTE — Telephone Encounter (Signed)
Pt's daughter Earnest Bailey ) called again this morning says Patient 's BP was 185/60 but she still won't go to hospital unless , Dr. Jenetta Downer or Lenna Sciara call her to tell her to [place]!  ---Forwarding message to medical assistant.  --Dion Body

## 2019-03-23 NOTE — Telephone Encounter (Signed)
Called and spoke to the patient.  She states that symptoms of nausea and dizziness from yesterday have improved and she does not feel like she needs to go to the ER as advised.  Patient states that she did have some slight vomiting yesterday evening with the nausea and a increased amount of burping.  Patient states that she took Omerprozole and symptoms improved greatly after taking.  Patient denies any current symptoms - including nausea, dizziness, vertigo, confusion, chest pain or SOB.  Patient states that BP this AM was 155/71 which is normal for her; gait is normal, she states that she has equal strength on both sides, pupils are equal per patient.  Patient is going to call cards this morning and inform them of what happened yesterday with elevated BP and will make an appointment as she is due to follow up with their office.  Patient would like to come in for labs at our office.  Please review and advise.  Patient also states that she will go to the [facility] if still recommended by Dr. Raliegh Scarlet but she feels like she is better and that this is not needed.

## 2019-03-24 ENCOUNTER — Ambulatory Visit (INDEPENDENT_AMBULATORY_CARE_PROVIDER_SITE_OTHER): Payer: Medicare Other | Admitting: Physician Assistant

## 2019-03-24 ENCOUNTER — Other Ambulatory Visit: Payer: Self-pay

## 2019-03-24 ENCOUNTER — Encounter: Payer: Self-pay | Admitting: Physician Assistant

## 2019-03-24 VITALS — BP 132/68 | HR 75 | Ht 63.5 in | Wt 174.4 lb

## 2019-03-24 DIAGNOSIS — E785 Hyperlipidemia, unspecified: Secondary | ICD-10-CM | POA: Diagnosis not present

## 2019-03-24 DIAGNOSIS — R42 Dizziness and giddiness: Secondary | ICD-10-CM

## 2019-03-24 DIAGNOSIS — I119 Hypertensive heart disease without heart failure: Secondary | ICD-10-CM

## 2019-03-24 DIAGNOSIS — I6523 Occlusion and stenosis of bilateral carotid arteries: Secondary | ICD-10-CM

## 2019-03-24 MED ORDER — HYDROCHLOROTHIAZIDE 25 MG PO TABS: 37.5000 mg | ORAL_TABLET | Freq: Every day | ORAL | 3 refills | Status: DC

## 2019-03-24 NOTE — Progress Notes (Signed)
Cardiology Office Note    Date:  03/24/2019   ID:  [name], DOB 1940-12-25, MRN [id number]  PCP:  Mellody Dance, DO  Cardiologist: Larae Grooms, MD EPS: None  No chief complaint on file.   History of Present Illness:  [name] is a 78 y.o. 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  Normal nuclear stress test in 2005.  Monday am when she turned to help with her husband, retired Stage manager who is a hospice patient she had severe vertigo episode-very dizzy, nausea & vomiting. Hospice nurse was with her.BP 190/90. She called EMS and they ran an EKG-normal, BP 180/80 and 170/80. BP stayed up most of the day and she had burping all day and now constipated.Thinks she may have viral GI bug. Had made a large bavarian sauerkraut sausage the night before. No appetite, feels very weak. Denies chest pain, shortness of breath. Her daughter came to help with her husband.BP now running 155/69. Yesterday developed a hive like reaction on right hand-had episode last week after using clinique cream and had hives up her whole arm. Happens with many other products.    Past Medical History:  Diagnosis Date   Arthritis    Back pain    had cortisone injection 6/15, Dr Durward Fortes   Carotid artery occlusion    Carotid bruit    Diverticulosis    GERD (gastroesophageal reflux disease)    Hyperlipidemia    Hypertension    Injury of left leg 06/05/12   Pt. fell   Peripheral vascular disease ([place])    Shingles outbreak 2017   Thyroid disease 02-27-12   lt. thyroid nodule, bx. done 5 yrs ago-now some enlargement is seen.    Past Surgical History:  Procedure Laterality Date   ABDOMINAL HYSTERECTOMY  01/1980   CAROTID ENDARTERECTOMY  1998   Stable since surgery -slight build up returning-follows with yrly Dopplers   CATARACT EXTRACTION, BILATERAL  2004 or 2005 per pt    COLONOSCOPY     EYE SURGERY  02-27-12   Bil. Cataract surgery   THYROID LOBECTOMY  03/02/2012   Procedure: THYROID LOBECTOMY;  Surgeon: Earnstine Regal, MD;  Location: [facility];  Service: General;  Laterality: Left;    Current Medications: Current Meds  Medication Sig   acetaminophen (TYLENOL) 500 MG tablet Take 1,000 mg by mouth 2 (two) times daily. For arthritis pain   amLODipine (NORVASC) 2.5 MG tablet Take 1 tablet (2.5 mg total) by mouth daily with breakfast.   aspirin 81 MG EC tablet Take 81 mg by mouth daily.    B Complex Vitamins (VITAMIN-B COMPLEX PO) Take 1 tablet by mouth daily with breakfast.    Cholecalciferol (VITAMIN D) 50 MCG (2000 UT) tablet Take 4,000 Units by mouth daily.   docusate sodium (COLACE) 100 MG capsule Take 100 mg by mouth 2 (two) times daily.   hydrochlorothiazide (HYDRODIURIL) 25 MG tablet Take 1.5 tablets (37.5 mg total) by mouth daily.   KRILL OIL PO Take 500 mg by mouth daily. MEGA RED   levothyroxine (SYNTHROID) 112 MCG tablet Take 1 tablet (112 mcg total) by mouth daily.   Loratadine (CLARITIN) 10 MG CAPS Take 10 mg by mouth as needed (allergies). As needed   metoprolol succinate (TOPROL-XL) 100 MG 24 hr tablet TAKE 1 TABLET BY MOUTH DAILY WITH BREAKFAST OR IMMEDIATELY FOLLOWING A MEAL   Multiple Vitamin (MULTIVITAMIN) tablet Take 1 tablet by  mouth daily.     rosuvastatin (CRESTOR) 20 MG tablet Take 1 tablet (20 mg total) by mouth daily with breakfast.   telmisartan (MICARDIS) 80 MG tablet Take 1 tablet (80 mg total) by mouth daily.   verapamil (CALAN-SR) 240 MG CR tablet Take 1 tablet (240 mg total) by mouth daily with breakfast.   [DISCONTINUED] hydrochlorothiazide (HYDRODIURIL) 25 MG tablet Take 1 tablet (25 mg total) by mouth daily.     Allergies:   Zebeta and Ace inhibitors   Social History   Socioeconomic History   Marital status: Married    Spouse name: Not on file   Number of children: Not on file   Years of education: Not  on file   Highest education level: Not on file  Occupational History   Not on file  Social Needs   Financial resource strain: Not on file   Food insecurity    Worry: Not on file    Inability: Not on file   Transportation needs    Medical: Not on file    Non-medical: Not on file  Tobacco Use   Smoking status: Former Smoker    Quit date: 09/24/1975    Years since quitting: 43.5   Smokeless tobacco: Never Used  Substance and Sexual Activity   Alcohol use: Yes    Alcohol/week: 1.0 standard drinks    Types: 1 Glasses of wine per week   Drug use: No   Sexual activity: Not Currently    Partners: Male    Birth control/protection: Surgical    Comment: hysterectomy  Lifestyle   Physical activity    Days per week: Not on file    Minutes per session: Not on file   Stress: Not on file  Relationships   Social connections    Talks on phone: Not on file    Gets together: Not on file    Attends religious service: Not on file    Active member of club or organization: Not on file    Attends meetings of clubs or organizations: Not on file    Relationship status: Not on file  Other Topics Concern   Not on file  Social History Narrative   Not on file     Family History:  The patient's   family history includes Arthritis in her mother; Atrial fibrillation in her mother; Heart disease in her brother and mother; Hyperlipidemia in her mother; Hypertension in her mother; Stroke in her mother.   ROS:   Please see the history of present illness.    Review of Systems  Constitution: Negative.  HENT: Negative.   Eyes: Negative.   Cardiovascular: Negative.   Respiratory: Negative.   Hematologic/Lymphatic: Negative.   Skin: Positive for itching.  Musculoskeletal: Negative.  Negative for joint pain.  Gastrointestinal: Negative.   Genitourinary: Negative.   Neurological: Positive for vertigo.   All other systems reviewed and are negative.   PHYSICAL EXAM:   VS:  BP 132/68     Pulse 75    Ht 5' 3.5" (1.613 m)    Wt 174 lb 6.4 oz (79.1 kg)    LMP 09/24/1979    SpO2 98%    BMI 30.41 kg/m   Physical Exam  GEN: Well nourished, well developed, in no acute distress  Neck: bilateral carotid bruits no JVD  Cardiac:RRR; no murmurs, rubs, or gallops  Respiratory:  clear to auscultation bilaterally, normal work of breathing GI: soft, nontender, nondistended, + BS Ext: without cyanosis, clubbing, or edema, Good  distal pulses bilaterally Neuro:  Alert and Oriented x 3 Psych: euthymic mood, full affect  Wt Readings from Last 3 Encounters:  03/24/19 174 lb 6.4 oz (79.1 kg)  02/17/19 175 lb (79.4 kg)  11/24/18 170 lb (77.1 kg)      Studies/Labs Reviewed:   EKG:  EKG is not ordered today.  The ekg reviewed from EMS Monday and is NSR, normal EKG. Recent Labs: 07/27/2018: ALT 30; Hemoglobin 12.5; Platelets 356 02/16/2019: BUN 18; Creatinine, Ser 0.94; Potassium 4.7; Sodium 138; TSH 5.190   Lipid Panel    Component Value Date/Time   CHOL 151 07/27/2018 1004   TRIG 143 07/27/2018 1004   HDL 43 07/27/2018 1004   CHOLHDL 3.5 07/27/2018 1004   CHOLHDL 3.9 08/26/2016 1035   VLDL 32 (H) 08/26/2016 1035   LDLCALC 79 07/27/2018 1004   LDLDIRECT 73.4 01/07/2013 0939    Additional studies/ records that were reviewed today include:  Carotid Dopplers 11/2019Summary: Right Carotid: Velocities in the right ICA are consistent with a 40-59%                stenosis. The ECA appears >50% stenosed.   Left Carotid: Velocities in the left ICA are consistent with a 40-59% stenosis.               The ECA appears >50% stenosed.   Vertebrals:  Bilateral vertebral arteries demonstrate antegrade flow. Subclavians: Normal flow hemodynamics were seen in bilateral subclavian              arteries.   *See table(s) above for measurements and observations.     ASSESSMENT:    1. Benign hypertensive heart disease without heart failure   2. Bilateral carotid artery stenosis   3.  Dyslipidemia- low HDL and inc VLDL & TG   4. Vertigo      PLAN:  In order of problems listed above:  Hypertensive heart disease BP running high-had an episode of vertigo Monday assoc with N/V and elevated BP. EMS ran an EKG which was normal. High salt diet the night before. Under a lot of stress with her husband in hospice care.  Patient would like to proceed with echo and stress test to rule out cardiac source. Increase HCTZ to 25 mg 1 1/2 daily.F/U with Dr. Irish Lack after testing.  Carotid artery disease status post right carotid endarterectomy 2005-last Dopplers 07/2018 40 to 59% bilateral ICA stenosis-recheck dopplers with recent episode.  Hyperlipidemia LDL 79 07/2018 on crestor check FLP  Family history of CAD-will order lexiscan.    Medication Adjustments/Labs and Tests Ordered: Current medicines are reviewed at length with the patient today.  Concerns regarding medicines are outlined above.  Medication changes, Labs and Tests ordered today are listed in the Patient Instructions below. Patient Instructions   Medication Instructions:  Your physician has recommended you make the following change in your medication:   Increase Hydrochlorothiazide to 37.5MG , 1 1/2 tablets orally, once a day  If you need a refill on your cardiac medications before your next appointment, please call your pharmacy.   Lab work: CBC, CMET, and Lipid panel ordered today  If you have labs (blood work) drawn today and your tests are completely normal, you will receive your results only by:  [place] (if you have MyChart) OR  A paper copy in the mail If you have any lab test that is abnormal or we need to change your treatment, we will call you to review the results.  Testing/Procedures:  Your physician has requested that you have an echocardiogram. Echocardiography is a painless test that uses sound waves to create images of your heart. It provides your doctor with information about the  size and shape of your heart and how well your hearts chambers and valves are working. This procedure takes approximately one hour. There are no restrictions for this procedure.  Your physician has requested that you have a lexiscan myoview. For further information please visit [email or web address]. Please follow instruction sheet, as given.  .Your physician has requested that you have a carotid duplex at [facility]. This test is an ultrasound of the carotid arteries in your neck. It looks at blood flow through these arteries that supply the brain with blood. Allow one hour for this exam. There are no restrictions or special instructions.    Follow-Up:    Any Other Special Instructions Will Be Listed Below (If Applicable).  Two Gram Sodium Diet 2000 mg  What is Sodium? Sodium is a mineral found naturally in many foods. The most significant source of sodium in the diet is table salt, which is about 40% sodium.  Processed, convenience, and preserved foods also contain a large amount of sodium.  The body needs only 500 mg of sodium daily to function,  A normal diet provides more than enough sodium even if you do not use salt.  Why Limit Sodium? A build up of sodium in the body can cause thirst, increased blood pressure, shortness of breath, and water retention.  Decreasing sodium in the diet can reduce edema and risk of heart attack or stroke associated with high blood pressure.  Keep in mind that there are many other factors involved in these health problems.  Heredity, obesity, lack of exercise, cigarette smoking, stress and what you eat all play a role.  General Guidelines:  Do not add salt at the table or in cooking.  One teaspoon of salt contains over 2 grams of sodium.  Read food labels  Avoid processed and convenience foods  Ask your dietitian before eating any foods not dicussed in the menu planning guidelines  Consult your physician if you wish to use a salt  substitute or a sodium containing medication such as antacids.  Limit milk and milk products to 16 oz (2 cups) per day.  Shopping Hints:  READ LABELS!! "Dietetic" does not necessarily mean low sodium.  Salt and other sodium ingredients are often added to foods during processing.   Menu Planning Guidelines Food Group Choose More Often Avoid  Beverages (see also the milk group All fruit juices, low-sodium, salt-free vegetables juices, low-sodium carbonated beverages Regular vegetable or tomato juices, commercially softened water used for drinking or cooking  Breads and Cereals Enriched white, wheat, rye and pumpernickel bread, hard rolls and dinner rolls; muffins, cornbread and waffles; most dry cereals, cooked cereal without added salt; unsalted crackers and breadsticks; low sodium or homemade bread crumbs Bread, rolls and crackers with salted tops; quick breads; instant hot cereals; pancakes; commercial bread stuffing; self-rising flower and biscuit mixes; regular bread crumbs or cracker crumbs  Desserts and Sweets Desserts and sweets mad with mild should be within allowance Instant pudding mixes and cake mixes  Fats Butter or margarine; vegetable oils; unsalted salad dressings, regular salad dressings limited to 1 Tbs; light, sour and heavy cream Regular salad dressings containing bacon fat, bacon bits, and salt pork; snack dips made with instant soup mixes or processed cheese; salted nuts  Fruits Most fresh, frozen and canned fruits  Fruits processed with salt or sodium-containing ingredient (some dried fruits are processed with sodium sulfites        Vegetables Fresh, frozen vegetables and low- sodium canned vegetables Regular canned vegetables, sauerkraut, pickled vegetables, and others prepared in brine; frozen vegetables in sauces; vegetables seasoned with ham, bacon or salt pork  Condiments, Sauces, Miscellaneous  Salt substitute with physician's approval; pepper, herbs, spices;  vinegar, lemon or lime juice; hot pepper sauce; garlic powder, onion powder, low sodium soy sauce (1 Tbs.); low sodium condiments (ketchup, chili sauce, mustard) in limited amounts (1 tsp.) fresh ground horseradish; unsalted tortilla chips, pretzels, potato chips, popcorn, salsa (1/4 cup) Any seasoning made with salt including garlic salt, celery salt, onion salt, and seasoned salt; sea salt, rock salt, kosher salt; meat tenderizers; monosodium glutamate; mustard, regular soy sauce, barbecue, sauce, chili sauce, teriyaki sauce, steak sauce, Worcestershire sauce, and most flavored vinegars; canned gravy and mixes; regular condiments; salted snack foods, olives, picles, relish, horseradish sauce, catsup   Food preparation: Try these seasonings Meats:    Pork Sage, onion Serve with applesauce  Chicken Poultry seasoning, thyme, parsley Serve with cranberry sauce  Lamb Curry powder, rosemary, garlic, thyme Serve with mint sauce or jelly  Veal Marjoram, basil Serve with current jelly, cranberry sauce  Beef Pepper, bay leaf Serve with dry mustard, unsalted chive butter  Fish [place] leaf, dill Serve with unsalted lemon butter, unsalted parsley butter  Vegetables:    Asparagus Lemon juice   Broccoli Lemon juice   Carrots Mustard dressing parsley, mint, nutmeg, glazed with unsalted butter and sugar   Green beans Marjoram, lemon juice, nutmeg,dill seed   Tomatoes Basil, marjoram, onion   Spice /blend for [employer]" 4 tsp ground thyme 1 tsp ground sage 3 tsp ground rosemary 4 tsp ground marjoram   Test your knowledge 1. A product that says "Salt Free" may still contain sodium. True or False 2. Garlic Powder and Hot Pepper Sauce an be used as alternative seasonings.True or False 3. Processed foods have more sodium than fresh foods.  True or False 4. Canned Vegetables have less sodium than froze True or False  WAYS TO DECREASE YOUR SODIUM INTAKE 1. Avoid the use of added salt in cooking and at the  table.  Table salt (and other prepared seasonings which contain salt) is probably one of the greatest sources of sodium in the diet.  Unsalted foods can gain flavor from the sweet, sour, and butter taste sensations of herbs and spices.  Instead of using salt for seasoning, try the following seasonings with the foods listed.  Remember: how you use them to enhance natural food flavors is limited only by your creativity... Allspice-Meat, fish, eggs, fruit, peas, red and yellow vegetables Almond Extract-Fruit baked goods Anise Seed-Sweet breads, fruit, carrots, beets, cottage cheese, cookies (tastes like licorice) Basil-Meat, fish, eggs, vegetables, rice, vegetables salads, soups, sauces Bay Leaf-Meat, fish, stews, poultry Burnet-Salad, vegetables (cucumber-like flavor) Caraway Seed-Bread, cookies, cottage cheese, meat, vegetables, cheese, rice Cardamon-Baked goods, fruit, soups Celery Powder or seed-Salads, salad dressings, sauces, meatloaf, soup, bread.Do not use  celery salt Chervil-Meats, salads, fish, eggs, vegetables, cottage cheese (parsley-like flavor) Chili Power-Meatloaf, chicken cheese, corn, eggplant, egg dishes Chives-Salads cottage cheese, egg dishes, soups, vegetables, sauces Cilantro-Salsa, casseroles Cinnamon-Baked goods, fruit, pork, lamb, chicken, carrots Cloves-Fruit, baked goods, fish, pot roast, green beans, beets, carrots Coriander-Pastry, cookies, meat, salads, cheese (lemon-orange flavor) Cumin-Meatloaf, fish,cheese, eggs, cabbage,fruit pie (caraway flavor) [employer], fruit, eggs, fish, poultry, cottage cheese, vegetables Dill Seed-Meat,  cottage cheese, poultry, vegetables, fish, salads, bread Fennel Seed-Bread, cookies, apples, pork, eggs, fish, beets, cabbage, cheese, Licorice-like flavor Garlic-(buds or powder) Salads, meat, poultry, fish, bread, butter, vegetables, potatoes.Do not  use garlic salt Ginger-Fruit, vegetables, baked goods, meat, fish,  poultry Horseradish Root-Meet, vegetables, butter Lemon Juice or Extract-Vegetables, fruit, tea, baked goods, fish salads Mace-Baked goods fruit, vegetables, fish, poultry (taste like nutmeg) Maple Extract-Syrups Marjoram-Meat, chicken, fish, vegetables, breads, green salads (taste like Sage) Mint-Tea, lamb, sherbet, vegetables, desserts, carrots, cabbage Mustard, Dry or Seed-Cheese, eggs, meats, vegetables, poultry Nutmeg-Baked goods, fruit, chicken, eggs, vegetables, desserts Onion Powder-Meat, fish, poultry, vegetables, cheese, eggs, bread, rice salads (Do not use   Onion salt) Orange Extract-Desserts, baked goods Oregano-Pasta, eggs, cheese, onions, pork, lamb, fish, chicken, vegetables, green salads Paprika-Meat, fish, poultry, eggs, cheese, vegetables Parsley Flakes-Butter, vegetables, meat fish, poultry, eggs, bread, salads (certain forms may   Contain sodium Pepper-Meat fish, poultry, vegetables, eggs Peppermint Extract-Desserts, baked goods Poppy Seed-Eggs, bread, cheese, fruit dressings, baked goods, noodles, vegetables, cottage  [employer], poultry, meat, fish, cauliflower, turnips,eggs bread Saffron-Rice, bread, veal, chicken, fish, eggs Sage-Meat, fish, poultry, onions, eggplant, tomateos, pork, stews Savory-Eggs, salads, poultry, meat, rice, vegetables, soups, pork Tarragon-Meat, poultry, fish, eggs, butter, vegetables (licorice-like flavor)  Thyme-Meat, poultry, fish, eggs, vegetables, (clover-like flavor), sauces, soups Tumeric-Salads, butter, eggs, fish, rice, vegetables (saffron-like flavor) Vanilla Extract-Baked goods, candy Vinegar-Salads, vegetables, meat marinades Walnut Extract-baked goods, candy  2. Choose your Foods Wisely   The following is a list of foods to avoid which are high in sodium:  Meats-Avoid all smoked, canned, salt cured, dried and kosher meat and fish as well as Anchovies   [employer]  meats:Bologna, Liverwurst, Pastrami Canned meat or fish  Marinated herring Caviar    Pepperoni Corned Beef   Pizza Dried chipped beef  Salami Frozen breaded fish or meat Salt pork Frankfurters or hot dogs  Sardines Gefilte fish   Sausage Ham (boiled ham, Proscuitto Smoked butt    spiced ham)   Spam      TV Dinners Vegetables Canned vegetables (Regular) Relish Canned mushrooms  Sauerkraut Olives    Tomato juice Pickles  Bakery and Dessert Products Canned puddings  Cream pies Cheesecake   Decorated cakes Cookies  Beverages/Juices Tomato juice, regular  Gatorade   V-8 vegetable juice, regular  Breads and Cereals Biscuit mixes   Salted potato chips, corn chips, pretzels Bread stuffing mixes  Salted crackers and rolls Pancake and waffle mixes Self-rising flour  Seasonings Accent    Meat sauces Barbecue sauce  Meat tenderizer Catsup    Monosodium glutamate (MSG) Celery salt   Onion salt Chili sauce   Prepared mustard Garlic salt   Salt, seasoned salt, sea salt Gravy mixes   Soy sauce Horseradish   Steak sauce Ketchup   Tartar sauce Lite salt    Teriyaki sauce Marinade mixes   Worcestershire sauce  Others Baking powder   Cocoa and cocoa mixes Baking soda   Commercial casserole mixes Candy-caramels, chocolate  Dehydrated soups    Bars, fudge,nougats  Instant rice and pasta mixes Canned broth or soup  Maraschino cherries Cheese, aged and processed cheese and cheese spreads  Learning Assessment Quiz  Indicated T (for True) or F (for False) for each of the following statements:  1. _____ Fresh fruits and vegetables and unprocessed grains are generally low in sodium 2. _____ Water may contain a considerable amount of sodium, depending on the source 3. _____ You can  always tell if a food is high in sodium by tasting it 4. _____ Certain laxatives my be high in sodium and should be avoided unless prescribed   by a physician or pharmacist 5. _____ Salt substitutes may be  used freely by anyone on a sodium restricted diet 6. _____ Sodium is present in table salt, food additives and as a natural component of   most foods 7. _____ Table salt is approximately 90% sodium 8. _____ Limiting sodium intake may help prevent excess fluid accumulation in the body 9. _____ On a sodium-restricted diet, seasonings such as bouillon soy sauce, and    cooking wine should be used in place of table salt 10. _____ On an ingredient list, a product which lists monosodium glutamate as the first   ingredient is an appropriate food to include on a low sodium diet  Circle the best answer(s) to the following statements (Hint: there may be more than one correct answer)  11. On a low-sodium diet, some acceptable snack items are:    A. Olives  F. Bean dip   K. Grapefruit juice    B. Salted Pretzels G. Commercial Popcorn   L. Canned peaches    C. Carrot Sticks  H. Bouillon   M. Unsalted nuts   D. [place] fries  I. Peanut butter crackers N. Salami   E. Sweet pickles J. Tomato Juice   O. Pizza  12.  Seasonings that may be used freely on a reduced - sodium diet include   A. Lemon wedges F.Monosodium glutamate K. Celery seed    B.Soysauce   G. Pepper   L. Mustard powder   C. Sea salt  H. Cooking wine  M. Onion flakes   D. Vinegar  E. Prepared horseradish N. Salsa   E. Sage   J. Worcestershire sauce  O. [address]       Sumner Boast, PA-C  03/24/2019 12:13 PM    [facility] [place], [place], [place]  [address] Phone: [phone]; Fax: [phone]

## 2019-03-24 NOTE — Patient Instructions (Signed)
Medication Instructions:  Your physician has recommended you make the following change in your medication:   Increase Hydrochlorothiazide to 37.5MG , 1 1/2 tablets orally, once a day  If you need a refill on your cardiac medications before your next appointment, please call your pharmacy.   Lab work: CBC, CMET, and Lipid panel ordered today  If you have labs (blood work) drawn today and your tests are completely normal, you will receive your results only by: Marland Kitchen MyChart Message (if you have MyChart) OR . A paper copy in the mail If you have any lab test that is abnormal or we need to change your treatment, we will call you to review the results.  Testing/Procedures:  Your physician has requested that you have an echocardiogram. Echocardiography is a painless test that uses sound waves to create images of your heart. It provides your doctor with information about the size and shape of your heart and how well your heart's chambers and valves are working. This procedure takes approximately one hour. There are no restrictions for this procedure.  Your physician has requested that you have a lexiscan myoview. For further information please visit [email or web address]. Please follow instruction sheet, as given.  .Your physician has requested that you have a carotid duplex at [facility]. This test is an ultrasound of the carotid arteries in your neck. It looks at blood flow through these arteries that supply the brain with blood. Allow one hour for this exam. There are no restrictions or special instructions.    Follow-Up:    Any Other Special Instructions Will Be Listed Below (If Applicable).  Two Gram Sodium Diet 2000 mg  What is Sodium? Sodium is a mineral found naturally in many foods. The most significant source of sodium in the diet is table salt, which is about 40% sodium.  Processed, convenience, and preserved foods also contain a large amount of sodium.  The body needs  only 500 mg of sodium daily to function,  A normal diet provides more than enough sodium even if you do not use salt.  Why Limit Sodium? A build up of sodium in the body can cause thirst, increased blood pressure, shortness of breath, and water retention.  Decreasing sodium in the diet can reduce edema and risk of heart attack or stroke associated with high blood pressure.  Keep in mind that there are many other factors involved in these health problems.  Heredity, obesity, lack of exercise, cigarette smoking, stress and what you eat all play a role.  General Guidelines:  Do not add salt at the table or in cooking.  One teaspoon of salt contains over 2 grams of sodium.  Read food labels  Avoid processed and convenience foods  Ask your dietitian before eating any foods not dicussed in the menu planning guidelines  Consult your physician if you wish to use a salt substitute or a sodium containing medication such as antacids.  Limit milk and milk products to 16 oz (2 cups) per day.  Shopping Hints:  READ LABELS!! "Dietetic" does not necessarily mean low sodium.  Salt and other sodium ingredients are often added to foods during processing.   Menu Planning Guidelines Food Group Choose More Often Avoid  Beverages (see also the milk group All fruit juices, low-sodium, salt-free vegetables juices, low-sodium carbonated beverages Regular vegetable or tomato juices, commercially softened water used for drinking or cooking  Breads and Cereals Enriched white, wheat, rye and pumpernickel bread, hard rolls and dinner rolls; muffins, cornbread  and waffles; most dry cereals, cooked cereal without added salt; unsalted crackers and breadsticks; low sodium or homemade bread crumbs Bread, rolls and crackers with salted tops; quick breads; instant hot cereals; pancakes; commercial bread stuffing; self-rising flower and biscuit mixes; regular bread crumbs or cracker crumbs  Desserts and Sweets Desserts and  sweets mad with mild should be within allowance Instant pudding mixes and cake mixes  Fats Butter or margarine; vegetable oils; unsalted salad dressings, regular salad dressings limited to 1 Tbs; light, sour and heavy cream Regular salad dressings containing bacon fat, bacon bits, and salt pork; snack dips made with instant soup mixes or processed cheese; salted nuts  Fruits Most fresh, frozen and canned fruits Fruits processed with salt or sodium-containing ingredient (some dried fruits are processed with sodium sulfites        Vegetables Fresh, frozen vegetables and low- sodium canned vegetables Regular canned vegetables, sauerkraut, pickled vegetables, and others prepared in brine; frozen vegetables in sauces; vegetables seasoned with ham, bacon or salt pork  Condiments, Sauces, Miscellaneous  Salt substitute with physician's approval; pepper, herbs, spices; vinegar, lemon or lime juice; hot pepper sauce; garlic powder, onion powder, low sodium soy sauce (1 Tbs.); low sodium condiments (ketchup, chili sauce, mustard) in limited amounts (1 tsp.) fresh ground horseradish; unsalted tortilla chips, pretzels, potato chips, popcorn, salsa (1/4 cup) Any seasoning made with salt including garlic salt, celery salt, onion salt, and seasoned salt; sea salt, rock salt, kosher salt; meat tenderizers; monosodium glutamate; mustard, regular soy sauce, barbecue, sauce, chili sauce, teriyaki sauce, steak sauce, Worcestershire sauce, and most flavored vinegars; canned gravy and mixes; regular condiments; salted snack foods, olives, picles, relish, horseradish sauce, catsup   Food preparation: Try these seasonings Meats:    Pork Sage, onion Serve with applesauce  Chicken Poultry seasoning, thyme, parsley Serve with cranberry sauce  Lamb Curry powder, rosemary, garlic, thyme Serve with mint sauce or jelly  Veal Marjoram, basil Serve with current jelly, cranberry sauce  Beef Pepper, bay leaf Serve with dry mustard,  unsalted chive butter  Fish [place] leaf, dill Serve with unsalted lemon butter, unsalted parsley butter  Vegetables:    Asparagus Lemon juice   Broccoli Lemon juice   Carrots Mustard dressing parsley, mint, nutmeg, glazed with unsalted butter and sugar   Green beans Marjoram, lemon juice, nutmeg,dill seed   Tomatoes Basil, marjoram, onion   Spice /blend for [employer]" 4 tsp ground thyme 1 tsp ground sage 3 tsp ground rosemary 4 tsp ground marjoram   Test your knowledge 1. A product that says "Salt Free" may still contain sodium. True or False 2. Garlic Powder and Hot Pepper Sauce an be used as alternative seasonings.True or False 3. Processed foods have more sodium than fresh foods.  True or False 4. Canned Vegetables have less sodium than froze True or False  WAYS TO DECREASE YOUR SODIUM INTAKE 1. Avoid the use of added salt in cooking and at the table.  Table salt (and other prepared seasonings which contain salt) is probably one of the greatest sources of sodium in the diet.  Unsalted foods can gain flavor from the sweet, sour, and butter taste sensations of herbs and spices.  Instead of using salt for seasoning, try the following seasonings with the foods listed.  Remember: how you use them to enhance natural food flavors is limited only by your creativity... Allspice-Meat, fish, eggs, fruit, peas, red and yellow vegetables Almond Extract-Fruit baked goods Anise Seed-Sweet breads, fruit, carrots, beets, cottage  cheese, cookies (tastes like licorice) Basil-Meat, fish, eggs, vegetables, rice, vegetables salads, soups, sauces Bay Leaf-Meat, fish, stews, poultry Burnet-Salad, vegetables (cucumber-like flavor) Caraway Seed-Bread, cookies, cottage cheese, meat, vegetables, cheese, rice Cardamon-Baked goods, fruit, soups Celery Powder or seed-Salads, salad dressings, sauces, meatloaf, soup, bread.Do not use  celery salt Chervil-Meats, salads, fish, eggs, vegetables, cottage cheese  (parsley-like flavor) Chili Power-Meatloaf, chicken cheese, corn, eggplant, egg dishes Chives-Salads cottage cheese, egg dishes, soups, vegetables, sauces Cilantro-Salsa, casseroles Cinnamon-Baked goods, fruit, pork, lamb, chicken, carrots Cloves-Fruit, baked goods, fish, pot roast, green beans, beets, carrots Coriander-Pastry, cookies, meat, salads, cheese (lemon-orange flavor) Cumin-Meatloaf, fish,cheese, eggs, cabbage,fruit pie (caraway flavor) [employer], fruit, eggs, fish, poultry, cottage cheese, vegetables Dill Seed-Meat, cottage cheese, poultry, vegetables, fish, salads, bread Fennel Seed-Bread, cookies, apples, pork, eggs, fish, beets, cabbage, cheese, Licorice-like flavor Garlic-(buds or powder) Salads, meat, poultry, fish, bread, butter, vegetables, potatoes.Do not  use garlic salt Ginger-Fruit, vegetables, baked goods, meat, fish, poultry Horseradish Root-Meet, vegetables, butter Lemon Juice or Extract-Vegetables, fruit, tea, baked goods, fish salads Mace-Baked goods fruit, vegetables, fish, poultry (taste like nutmeg) Maple Extract-Syrups Marjoram-Meat, chicken, fish, vegetables, breads, green salads (taste like Sage) Mint-Tea, lamb, sherbet, vegetables, desserts, carrots, cabbage Mustard, Dry or Seed-Cheese, eggs, meats, vegetables, poultry Nutmeg-Baked goods, fruit, chicken, eggs, vegetables, desserts Onion Powder-Meat, fish, poultry, vegetables, cheese, eggs, bread, rice salads (Do not use   Onion salt) Orange Extract-Desserts, baked goods Oregano-Pasta, eggs, cheese, onions, pork, lamb, fish, chicken, vegetables, green salads Paprika-Meat, fish, poultry, eggs, cheese, vegetables Parsley Flakes-Butter, vegetables, meat fish, poultry, eggs, bread, salads (certain forms may   Contain sodium Pepper-Meat fish, poultry, vegetables, eggs Peppermint Extract-Desserts, baked goods Poppy Seed-Eggs, bread, cheese, fruit dressings, baked goods, noodles, vegetables, cottage   [employer], poultry, meat, fish, cauliflower, turnips,eggs bread Saffron-Rice, bread, veal, chicken, fish, eggs Sage-Meat, fish, poultry, onions, eggplant, tomateos, pork, stews Savory-Eggs, salads, poultry, meat, rice, vegetables, soups, pork Tarragon-Meat, poultry, fish, eggs, butter, vegetables (licorice-like flavor)  Thyme-Meat, poultry, fish, eggs, vegetables, (clover-like flavor), sauces, soups Tumeric-Salads, butter, eggs, fish, rice, vegetables (saffron-like flavor) Vanilla Extract-Baked goods, candy Vinegar-Salads, vegetables, meat marinades Walnut Extract-baked goods, candy  2. Choose your Foods Wisely   The following is a list of foods to avoid which are high in sodium:  Meats-Avoid all smoked, canned, salt cured, dried and kosher meat and fish as well as Anchovies   [employer] meats:Bologna, Liverwurst, Pastrami Canned meat or fish  Marinated herring Caviar    Pepperoni Corned Beef   Pizza Dried chipped beef  Salami Frozen breaded fish or meat Salt pork Frankfurters or hot dogs  Sardines Gefilte fish   Sausage Ham (boiled ham, Proscuitto Smoked butt    spiced ham)   Spam      TV Dinners Vegetables Canned vegetables (Regular) Relish Canned mushrooms  Sauerkraut Olives    Tomato juice Pickles  Bakery and Dessert Products Canned puddings  Cream pies Cheesecake   Decorated cakes Cookies  Beverages/Juices Tomato juice, regular  Gatorade   V-8 vegetable juice, regular  Breads and Cereals Biscuit mixes   Salted potato chips, corn chips, pretzels Bread stuffing mixes  Salted crackers and rolls Pancake and waffle mixes Self-rising flour  Seasonings Accent    Meat sauces Barbecue sauce  Meat tenderizer Catsup    Monosodium glutamate (MSG) Celery salt   Onion salt Chili sauce   Prepared mustard Garlic salt   Salt, seasoned salt, sea salt Gravy mixes   Soy sauce Horseradish  Steak sauce Ketchup   Tartar  sauce Lite salt    Teriyaki sauce Marinade mixes   Worcestershire sauce  Others Baking powder   Cocoa and cocoa mixes Baking soda   Commercial casserole mixes Candy-caramels, chocolate  Dehydrated soups    Bars, fudge,nougats  Instant rice and pasta mixes Canned broth or soup  Maraschino cherries Cheese, aged and processed cheese and cheese spreads  Learning Assessment Quiz  Indicated T (for True) or F (for False) for each of the following statements:  1. _____ Fresh fruits and vegetables and unprocessed grains are generally low in sodium 2. _____ Water may contain a considerable amount of sodium, depending on the source 3. _____ You can always tell if a food is high in sodium by tasting it 4. _____ Certain laxatives my be high in sodium and should be avoided unless prescribed   by a physician or pharmacist 5. _____ Salt substitutes may be used freely by anyone on a sodium restricted diet 6. _____ Sodium is present in table salt, food additives and as a natural component of   most foods 7. _____ Table salt is approximately 90% sodium 8. _____ Limiting sodium intake may help prevent excess fluid accumulation in the body 9. _____ On a sodium-restricted diet, seasonings such as bouillon soy sauce, and    cooking wine should be used in place of table salt 10. _____ On an ingredient list, a product which lists monosodium glutamate as the first   ingredient is an appropriate food to include on a low sodium diet  Circle the best answer(s) to the following statements (Hint: there may be more than one correct answer)  11. On a low-sodium diet, some acceptable snack items are:    A. Olives  F. Bean dip   K. Grapefruit juice    B. Salted Pretzels G. Commercial Popcorn   L. Canned peaches    C. Carrot Sticks  H. Bouillon   M. Unsalted nuts   D. [place] fries  I. Peanut butter crackers N. Salami   E. Sweet pickles J. Tomato Juice   O. Pizza  12.  Seasonings that may be used freely on a  reduced - sodium diet include   A. Lemon wedges F.Monosodium glutamate K. Celery seed    B.Soysauce   G. Pepper   L. Mustard powder   C. Sea salt  H. Cooking wine  M. Onion flakes   D. Vinegar  E. Prepared horseradish N. Salsa   E. Sage   J. Worcestershire sauce  O. Chutney

## 2019-03-25 ENCOUNTER — Telehealth (HOSPITAL_COMMUNITY): Payer: Self-pay | Admitting: *Deleted

## 2019-03-25 NOTE — Telephone Encounter (Signed)
Patient given detailed instructions per Myocardial Perfusion Study Information Sheet for the test on 03/31/19 at 8:00. Patient notified to arrive 15 minutes early and that it is imperative to arrive on time for appointment to keep from having the test rescheduled.  If you need to cancel or reschedule your appointment, please call the office within 24 hours of your appointment. . Patient verbalized understanding.[name]

## 2019-03-26 LAB — COMPREHENSIVE METABOLIC PANEL
ALT: 24 IU/L (ref 0–32)
AST: 22 IU/L (ref 0–40)
Albumin/Globulin Ratio: 1.9 (ref 1.2–2.2)
Albumin: 4.8 g/dL — ABNORMAL HIGH (ref 3.7–4.7)
Alkaline Phosphatase: 63 IU/L (ref 39–117)
BUN/Creatinine Ratio: 18 (ref 12–28)
BUN: 22 mg/dL (ref 8–27)
Bilirubin Total: 0.5 mg/dL (ref 0.0–1.2)
CO2: 24 mmol/L (ref 20–29)
Calcium: 9.8 mg/dL (ref 8.7–10.3)
Chloride: 97 mmol/L (ref 96–106)
Creatinine, Ser: 1.2 mg/dL — ABNORMAL HIGH (ref 0.57–1.00)
GFR calc Af Amer: 50 mL/min/{1.73_m2} — ABNORMAL LOW (ref >59)
GFR calc non Af Amer: 43 mL/min/{1.73_m2} — ABNORMAL LOW (ref >59)
Globulin, Total: 2.5 g/dL (ref 1.5–4.5)
Glucose: 97 mg/dL (ref 65–99)
Potassium: 4.2 mmol/L (ref 3.5–5.2)
Sodium: 138 mmol/L (ref 134–144)
Total Protein: 7.3 g/dL (ref 6.0–8.5)

## 2019-03-26 LAB — CBC
Hematocrit: 41.7 % (ref 34.0–46.6)
Hemoglobin: 13.7 g/dL (ref 11.1–15.9)
MCH: 31.2 pg (ref 26.6–33.0)
MCHC: 32.9 g/dL (ref 31.5–35.7)
MCV: 95 fL (ref 79–97)
Platelets: 370 10*3/uL (ref 150–450)
RBC: 4.39 x10E6/uL (ref 3.77–5.28)
RDW: 13.2 % (ref 11.7–15.4)
WBC: 14.9 10*3/uL — ABNORMAL HIGH (ref 3.4–10.8)

## 2019-03-26 LAB — LIPID PANEL
Chol/HDL Ratio: 4.2 ratio (ref 0.0–4.4)
Cholesterol, Total: 162 mg/dL (ref 100–199)
HDL: 39 mg/dL — ABNORMAL LOW (ref >39)
LDL Calculated: 80 mg/dL (ref 0–99)
Triglycerides: 216 mg/dL — ABNORMAL HIGH (ref 0–149)
VLDL Cholesterol Cal: 43 mg/dL — ABNORMAL HIGH (ref 5–40)

## 2019-03-29 ENCOUNTER — Telehealth: Payer: Self-pay

## 2019-03-29 DIAGNOSIS — D72829 Elevated white blood cell count, unspecified: Secondary | ICD-10-CM

## 2019-03-29 DIAGNOSIS — I119 Hypertensive heart disease without heart failure: Secondary | ICD-10-CM

## 2019-03-29 MED ORDER — ROSUVASTATIN CALCIUM 40 MG PO TABS: 40.0000 mg | ORAL_TABLET | Freq: Every day | ORAL | 3 refills | Status: DC

## 2019-03-29 NOTE — Telephone Encounter (Signed)
-----   Message from Imogene Burn, PA-C sent at 03/29/2019  8:47 AM EDT ----- Kidney function up a little. Will need repeat bmet in 2 weeks since we increased HCTZ. WBC count elevated. Ask her if she has a fever or any signs of infection such as UTI? Can be elevated for any type of virus. If so, f/u with PCP for such. If not, repeat CBC and diff in 2 weeks with bmet. Triglycerides high. Need to reduce sugars in diet. LDL 80. Would increase crestor to 40 mg daily. thanks

## 2019-03-29 NOTE — Telephone Encounter (Signed)
Called and spoke to patient and made her aware of lab results. Patient denies fever or S/Sx of UTI. Instructed patient to avoid sugars in her diet and increase crestor to 40 mg QD. Patient verbalized understanding. BMET and CBC w/diff ordered and scheduled for 7/20.

## 2019-03-30 ENCOUNTER — Telehealth: Payer: Self-pay | Admitting: Interventional Cardiology

## 2019-03-30 NOTE — Telephone Encounter (Signed)
That's fine to stay on current dose crestor and work on diet. Recheck lipids in 6 months

## 2019-03-30 NOTE — Telephone Encounter (Signed)
Patient calling and states that she did some thinking overnight and she has decided that she really does not want to increase her crestor from 20 mg to 40 mg QD. She states that she has had trouble with higher dose statins in the past and wants to know if it is okay if she stays on 20 mg QD and works on her diet. She states that she would be willing to take zetia instead of increasing her statin. Made patient aware that I will forward for review and recommendation.

## 2019-03-30 NOTE — Telephone Encounter (Signed)
New Message   Patient states she spoke to you about her results yesterday and she just wants to speak to you again to clarify some things.

## 2019-03-31 ENCOUNTER — Encounter (HOSPITAL_COMMUNITY): Payer: Self-pay

## 2019-03-31 ENCOUNTER — Ambulatory Visit (HOSPITAL_COMMUNITY): Payer: Medicare Other | Attending: Cardiovascular Disease

## 2019-03-31 ENCOUNTER — Other Ambulatory Visit: Payer: Self-pay

## 2019-03-31 DIAGNOSIS — I6523 Occlusion and stenosis of bilateral carotid arteries: Secondary | ICD-10-CM

## 2019-03-31 DIAGNOSIS — E785 Hyperlipidemia, unspecified: Secondary | ICD-10-CM | POA: Diagnosis not present

## 2019-03-31 DIAGNOSIS — I119 Hypertensive heart disease without heart failure: Secondary | ICD-10-CM | POA: Diagnosis not present

## 2019-03-31 DIAGNOSIS — R42 Dizziness and giddiness: Secondary | ICD-10-CM | POA: Insufficient documentation

## 2019-03-31 LAB — MYOCARDIAL PERFUSION IMAGING
LV dias vol: 54 mL (ref 46–106)
LV sys vol: 12 mL
Peak HR: 85 {beats}/min
Rest HR: 60 {beats}/min
SDS: 2
SRS: 5
SSS: 7
TID: 1.01

## 2019-03-31 MED ORDER — REGADENOSON 0.4 MG/5ML IV SOLN
0.4000 mg | Freq: Once | INTRAVENOUS | Status: AC
  Administered 2019-03-31: 0.4 mg via INTRAVENOUS

## 2019-03-31 MED ORDER — TECHNETIUM TC 99M TETROFOSMIN IV KIT
10.2000 | PACK | Freq: Once | INTRAVENOUS | Status: AC | PRN
  Administered 2019-03-31: 10.2 via INTRAVENOUS
  Filled 2019-03-31: qty 11

## 2019-03-31 MED ORDER — TECHNETIUM TC 99M TETROFOSMIN IV KIT
31.6000 | PACK | Freq: Once | INTRAVENOUS | Status: AC | PRN
  Administered 2019-03-31: 31.6 via INTRAVENOUS
  Filled 2019-03-31: qty 32

## 2019-03-31 NOTE — Telephone Encounter (Signed)
Left message for patient to call back  

## 2019-04-01 MED ORDER — ROSUVASTATIN CALCIUM 20 MG PO TABS: 20.0000 mg | ORAL_TABLET | Freq: Every day | ORAL | 3 refills | Status: DC

## 2019-04-01 NOTE — Telephone Encounter (Signed)
Patient called back returning Brittany's call 

## 2019-04-01 NOTE — Telephone Encounter (Signed)
Called and made patient aware that Selinda Eon was okay with her staying on crestor 20 mg QD and work on her diet. She is scheduled to see Dr. Irish Lack on 9/18 and will have fasting LIPIDS done at that time. Rx updated.

## 2019-04-02 ENCOUNTER — Ambulatory Visit (HOSPITAL_COMMUNITY): Payer: Medicare Other | Attending: Physician Assistant

## 2019-04-02 ENCOUNTER — Telehealth (HOSPITAL_COMMUNITY): Payer: Self-pay

## 2019-04-02 ENCOUNTER — Other Ambulatory Visit: Payer: Self-pay

## 2019-04-02 DIAGNOSIS — E785 Hyperlipidemia, unspecified: Secondary | ICD-10-CM | POA: Insufficient documentation

## 2019-04-02 DIAGNOSIS — R42 Dizziness and giddiness: Secondary | ICD-10-CM

## 2019-04-02 DIAGNOSIS — I119 Hypertensive heart disease without heart failure: Secondary | ICD-10-CM | POA: Diagnosis not present

## 2019-04-02 DIAGNOSIS — I6523 Occlusion and stenosis of bilateral carotid arteries: Secondary | ICD-10-CM | POA: Diagnosis not present

## 2019-04-02 NOTE — Telephone Encounter (Signed)
The above patient or their representative was contacted and gave the following answers to these questions:         Do you have any of the following symptoms? No  Fever                    Cough                   Shortness of breath  Do  you have any of the following other symptoms?    muscle pain         vomiting,        diarrhea        rash         weakness        red eye        abdominal pain         bruising          bruising or bleeding              joint pain           severe headache    Have you been in contact with someone who was or has been sick in the past 2 weeks? No  Yes                 Unsure                         Unable to assess   Does the person that you were in contact with have any of the following symptoms?   Cough         shortness of breath           muscle pain         vomiting,            diarrhea            rash            weakness           fever            red eye           abdominal pain           bruising  or  bleeding                joint pain                severe headache               Have you  or someone you have been in contact with traveled internationally in th last month?  No        If yes, which countries?   Have you  or someone you have been in contact with traveled outside [place] in th last month? No         If yes, which state and city?   COMMENTS OR ACTION PLAN FOR THIS PATIENT:          

## 2019-04-05 ENCOUNTER — Other Ambulatory Visit: Payer: Self-pay

## 2019-04-05 ENCOUNTER — Ambulatory Visit (HOSPITAL_COMMUNITY)
Admission: RE | Admit: 2019-04-05 | Discharge: 2019-04-05 | Disposition: A | Discharge status: Home / Self Care | Payer: Medicare Other | Source: Ambulatory Visit | Attending: Physician Assistant | Admitting: Physician Assistant

## 2019-04-05 DIAGNOSIS — E785 Hyperlipidemia, unspecified: Secondary | ICD-10-CM | POA: Diagnosis not present

## 2019-04-05 DIAGNOSIS — I6523 Occlusion and stenosis of bilateral carotid arteries: Secondary | ICD-10-CM | POA: Diagnosis not present

## 2019-04-05 DIAGNOSIS — I119 Hypertensive heart disease without heart failure: Secondary | ICD-10-CM | POA: Diagnosis not present

## 2019-04-05 DIAGNOSIS — R42 Dizziness and giddiness: Secondary | ICD-10-CM | POA: Insufficient documentation

## 2019-04-07 ENCOUNTER — Telehealth: Payer: Self-pay | Admitting: Interventional Cardiology

## 2019-04-07 ENCOUNTER — Encounter (HOSPITAL_COMMUNITY): Payer: Medicare Other

## 2019-04-07 NOTE — Telephone Encounter (Signed)
-----   Message from Imogene Burn, PA-C sent at 04/05/2019  7:54 AM EDT ----- Echo overall looks good. Normal LV function. Heart has some trouble relaxing-need good BP control and low sodium diet. Mild aortic insufficiency and mitral regurgitation but nothing to cause her symptoms of dizziness.

## 2019-04-07 NOTE — Telephone Encounter (Signed)
Patient has been made aware of echo and carotid results and recommendations. Patient states that she is already established with [facility]. Will forward results to Dr. Doren Custard. Patient verbalized understanding and thanked me for the call.

## 2019-04-07 NOTE — Telephone Encounter (Signed)
-----   Message from Imogene Burn, PA-C sent at 04/05/2019 12:16 PM EDT ----- Right carotid has progressed to 60-79% refer to [facility]

## 2019-04-07 NOTE — Telephone Encounter (Signed)
New message ° ° °Patient is returning call for lab results. Please call. °

## 2019-04-12 ENCOUNTER — Other Ambulatory Visit: Payer: Medicare Other | Admitting: *Deleted

## 2019-04-12 ENCOUNTER — Other Ambulatory Visit: Payer: Self-pay

## 2019-04-12 DIAGNOSIS — D72829 Elevated white blood cell count, unspecified: Secondary | ICD-10-CM | POA: Diagnosis not present

## 2019-04-12 DIAGNOSIS — I119 Hypertensive heart disease without heart failure: Secondary | ICD-10-CM | POA: Diagnosis not present

## 2019-04-12 LAB — BASIC METABOLIC PANEL
BUN/Creatinine Ratio: 20 (ref 12–28)
BUN: 20 mg/dL (ref 8–27)
CO2: 21 mmol/L (ref 20–29)
Calcium: 9.7 mg/dL (ref 8.7–10.3)
Chloride: 101 mmol/L (ref 96–106)
Creatinine, Ser: 0.99 mg/dL (ref 0.57–1.00)
GFR calc Af Amer: 63 mL/min/{1.73_m2} (ref >59)
GFR calc non Af Amer: 54 mL/min/{1.73_m2} — ABNORMAL LOW (ref >59)
Glucose: 77 mg/dL (ref 65–99)
Potassium: 4.6 mmol/L (ref 3.5–5.2)
Sodium: 139 mmol/L (ref 134–144)

## 2019-04-12 LAB — CBC WITH DIFFERENTIAL/PLATELET
Basophils Absolute: 0.1 10*3/uL (ref 0.0–0.2)
Basos: 1 %
EOS (ABSOLUTE): 0.4 10*3/uL (ref 0.0–0.4)
Eos: 5 %
Hematocrit: 35.6 % (ref 34.0–46.6)
Hemoglobin: 12.2 g/dL (ref 11.1–15.9)
Immature Grans (Abs): 0 10*3/uL (ref 0.0–0.1)
Immature Granulocytes: 0 %
Lymphocytes Absolute: 2.2 10*3/uL (ref 0.7–3.1)
Lymphs: 24 %
MCH: 31.3 pg (ref 26.6–33.0)
MCHC: 34.3 g/dL (ref 31.5–35.7)
MCV: 91 fL (ref 79–97)
Monocytes Absolute: 1.1 10*3/uL — ABNORMAL HIGH (ref 0.1–0.9)
Monocytes: 11 %
Neutrophils Absolute: 5.6 10*3/uL (ref 1.4–7.0)
Neutrophils: 59 %
Platelets: 349 10*3/uL (ref 150–450)
RBC: 3.9 x10E6/uL (ref 3.77–5.28)
RDW: 12.7 % (ref 11.7–15.4)
WBC: 9.5 10*3/uL (ref 3.4–10.8)

## 2019-04-13 ENCOUNTER — Telehealth (HOSPITAL_COMMUNITY): Payer: Self-pay | Admitting: Rehabilitation

## 2019-04-13 NOTE — Telephone Encounter (Signed)

## 2019-04-14 ENCOUNTER — Encounter: Payer: Self-pay | Admitting: Family

## 2019-04-14 ENCOUNTER — Other Ambulatory Visit: Payer: Self-pay

## 2019-04-14 ENCOUNTER — Ambulatory Visit (INDEPENDENT_AMBULATORY_CARE_PROVIDER_SITE_OTHER): Payer: Medicare Other | Admitting: Family

## 2019-04-14 VITALS — BP 131/63 | HR 74 | Temp 97.7°F | Resp 12 | Ht 64.0 in | Wt 174.9 lb

## 2019-04-14 DIAGNOSIS — Z87891 Personal history of nicotine dependence: Secondary | ICD-10-CM

## 2019-04-14 DIAGNOSIS — I6523 Occlusion and stenosis of bilateral carotid arteries: Secondary | ICD-10-CM | POA: Diagnosis not present

## 2019-04-14 DIAGNOSIS — Z9889 Other specified postprocedural states: Secondary | ICD-10-CM | POA: Diagnosis not present

## 2019-04-14 NOTE — Patient Instructions (Signed)

## 2019-04-14 NOTE — Progress Notes (Signed)
Chief Complaint: Follow up Extracranial Carotid Artery Stenosis   History of Present Illness  [name] is a 78 y.o. female who is s/p right carotid endarterectomy in 1998 by Dr. Amedeo Plenty; Dr. Scot Dock has been monitoring pt since Dr. Amedeo Plenty departure  On 05/18/2016duplex examshe had a patent right carotid endarterectomy site with a 50-69% recurrent stenosis. On the left side it was a less than 49% stenosis. However there was stenosis in the distal common carotid artery on the right greater than 50%. She was set up for a 1 year follow up visit.  She denies any known history of stroke or TIA. Specificallyshe deniesa history of amaurosis fugax or monocular blindness, unilateral facial drooping, hemiplegia, orreceptive or expressive aphasia. She had dizziness, nausea, and hypertensive episode in early July 2020; pt notifed her PCP; was evaluated by EMT's, she did not need to be taken to the ED.  She states her nuclear stress test and echo cardiogram results are ok enough.    She denies claudication type symptoms with walking. She does report intermittent right hip pain. She also reports a hx of one ESI that resolved her low back pain.   She is caretaker, she does have home help, to her husband who had a stroke.She is a retired Therapist, sports. She feels under a great deal of stress due to her husband's state.   Diabetic:no Tobacco YHC:WCBJSE smoker, quitin 1977, started smoking at age 1years   Pt meds include: Statin :yes ASA:yes, 172 mg daily Other anticoagulants/antiplatelets:no   Past Medical History:  Diagnosis Date  . Arthritis   . Back pain    had cortisone injection 6/15, Dr Durward Fortes  . Carotid artery occlusion   . Carotid bruit   . Diverticulosis   . GERD (gastroesophageal reflux disease)   . Hyperlipidemia   . Hypertension   . Injury of left leg 06/05/12   Pt. fell  . Peripheral vascular disease ([place])   . Shingles outbreak 2017  . Thyroid disease 02-27-12    lt. thyroid nodule, bx. done 5 yrs ago-now some enlargement is seen.    Social History Social History   Tobacco Use  . Smoking status: Former Smoker    Quit date: 09/24/1975    Years since quitting: 43.5  . Smokeless tobacco: Never Used  Substance Use Topics  . Alcohol use: Yes    Alcohol/week: 1.0 standard drinks    Types: 1 Glasses of wine per week  . Drug use: No    Family History Family History  Problem Relation Age of Onset  . Heart disease Mother   . Hypertension Mother   . Arthritis Mother   . Stroke Mother   . Hyperlipidemia Mother   . Atrial fibrillation Mother   . Heart disease Brother        before age 71  . Heart attack Neg Hx     Surgical History Past Surgical History:  Procedure Laterality Date  . ABDOMINAL HYSTERECTOMY  01/1980  . CAROTID ENDARTERECTOMY  1998   Stable since surgery -slight build up returning-follows with yrly Dopplers  . CATARACT EXTRACTION, BILATERAL  2004 or 2005 per pt  . COLONOSCOPY    . EYE SURGERY  02-27-12   Bil. Cataract surgery  . THYROID LOBECTOMY  03/02/2012   Procedure: THYROID LOBECTOMY;  Surgeon: Earnstine Regal, MD;  Location: [facility];  Service: General;  Laterality: Left;    Allergies  Allergen Reactions  . Zebeta Other (See Comments)    Does not  work, per patient.  . Ace Inhibitors Other (See Comments)    cough    Current Outpatient Medications  Medication Sig Dispense Refill  . acetaminophen (TYLENOL) 500 MG tablet Take 1,000 mg by mouth 2 (two) times daily. For arthritis pain    . amLODipine (NORVASC) 2.5 MG tablet Take 1 tablet (2.5 mg total) by mouth daily with breakfast. 90 tablet 3  . aspirin 81 MG EC tablet Take 81 mg by mouth daily.     . B Complex Vitamins (VITAMIN-B COMPLEX PO) Take 1 tablet by mouth daily with breakfast.     . Cholecalciferol (VITAMIN D) 50 MCG (2000 UT) tablet Take 4,000 Units by mouth daily.    Marland Kitchen docusate sodium (COLACE) 100 MG capsule Take 100 mg by mouth 2 (two) times daily.    .  hydrochlorothiazide (HYDRODIURIL) 25 MG tablet Take 1.5 tablets (37.5 mg total) by mouth daily. 135 tablet 3  . KRILL OIL PO Take 500 mg by mouth daily. MEGA RED    . levothyroxine (SYNTHROID) 112 MCG tablet Take 1 tablet (112 mcg total) by mouth daily. 90 tablet 1  . Loratadine (CLARITIN) 10 MG CAPS Take 10 mg by mouth as needed (allergies). As needed    . metoprolol succinate (TOPROL-XL) 100 MG 24 hr tablet TAKE 1 TABLET BY MOUTH DAILY WITH BREAKFAST OR IMMEDIATELY FOLLOWING A MEAL 90 tablet 3  . Multiple Vitamin (MULTIVITAMIN) tablet Take 1 tablet by mouth daily.      . rosuvastatin (CRESTOR) 20 MG tablet Take 1 tablet (20 mg total) by mouth daily. 90 tablet 3  . telmisartan (MICARDIS) 80 MG tablet Take 1 tablet (80 mg total) by mouth daily. 90 tablet 3  . verapamil (CALAN-SR) 240 MG CR tablet Take 1 tablet (240 mg total) by mouth daily with breakfast. 90 tablet 3   No current facility-administered medications for this visit.     Review of Systems : See HPI for pertinent positives and negatives.  Physical Examination  Vitals:   04/14/19 0908  BP: 131/63  Pulse: 74  Resp: 12  Temp: 97.7 F (36.5 C)  TempSrc: Temporal  SpO2: 98%  Weight: 174 lb 14.4 oz (79.3 kg)  Height: 5\' 4"  (1.626 m)   Body mass index is 30.02 kg/m.  General: WDWN obese female in NAD GAIT: normal Eyes: PERRLA HENT: No gross abnormalities.  Pulmonary:  Respirations are non-labored, good air movement in all fields, CTAB, no rales, rhonchi, or wheezing. Cardiac: regular rhythm, no detected murmur.  VASCULAR EXAM Carotid Bruits Right Left   Positive Negative     Abdominal aortic pulse is not palpable. Radial pulses are 2+ palpable and equal.                                                                                                                            LE Pulses Right Left       POPLITEAL  not palpable   not palpable  POSTERIOR TIBIAL  2+ palpable   2+ palpable        DORSALIS  PEDIS      ANTERIOR TIBIAL not palpable  not palpable    Gastrointestinal: soft, nontender, BS WNL, no r/g, no palpable masses. Musculoskeletal: no muscle atrophy/wasting. M/S 5/5 throughout, extremities without ischemic changes. Skin: No rashes, no ulcers, no cellulitis.   Neurologic:  A&O X 3; appropriate affect, sensation is normal; speech is normal, CN 2-12 intact, pain and light touch intact in extremities, motor exam as listed above. Psychiatric: Normal thought content, mood appropriate to clinical situation. Loquacious    DATA Carotid Duplex (04-05-19): Right Carotid: Velocities in the right ICA are consistent with a 60-79% stenosis. Left Carotid: Velocities in the left ICA are consistent with a 40-59% stenosis. Vertebrals:  Bilateral vertebral arteries demonstrate antegrade flow. Subclavians: Normal flow hemodynamics were seen in bilateral subclavian arteries. Increased stenosis in the right ICA, stable in the left ICA, compared to the exam on 08-11-18.   Assessment: [name] is a 78 y.o. female who is s/p right carotid endarterectomy in 1998. She has no history of stroke or TIA.   Fortunatelyshe does not have DM and quit smoking in 1977, smoked about 18 years. She takes a daily ASA and a statin.   Plan: Follow-up in 6 months with Carotid Duplex scan.   I discussed in depth with the patient the nature of atherosclerosis, and emphasized the importance of maximal medical management including strict control of blood pressure, blood glucose, and lipid levels, obtaining regular exercise, and continued cessation of smoking.  The patient is aware that without maximal medical management the underlying atherosclerotic disease process will progress, limiting the benefit of any interventions. The patient was given information about stroke prevention and what symptoms should prompt the patient to seek immediate medical care. Thank you for allowing [place] to participate in this  patient's care.  Clemon Chambers, RN, MSN, FNP-C [facility] Office: [phone]  Clinic Physician: Scot Dock  04/14/19 9:14 AM

## 2019-05-06 DIAGNOSIS — H04123 Dry eye syndrome of bilateral lacrimal glands: Secondary | ICD-10-CM | POA: Diagnosis not present

## 2019-05-06 DIAGNOSIS — Z961 Presence of intraocular lens: Secondary | ICD-10-CM | POA: Diagnosis not present

## 2019-06-03 ENCOUNTER — Other Ambulatory Visit: Payer: Self-pay

## 2019-06-03 DIAGNOSIS — E785 Hyperlipidemia, unspecified: Secondary | ICD-10-CM

## 2019-06-04 DIAGNOSIS — H6123 Impacted cerumen, bilateral: Secondary | ICD-10-CM | POA: Diagnosis not present

## 2019-06-04 DIAGNOSIS — J300 Vasomotor rhinitis: Secondary | ICD-10-CM | POA: Diagnosis not present

## 2019-06-07 ENCOUNTER — Other Ambulatory Visit: Payer: Medicare Other | Admitting: *Deleted

## 2019-06-07 ENCOUNTER — Other Ambulatory Visit: Payer: Self-pay

## 2019-06-07 DIAGNOSIS — E785 Hyperlipidemia, unspecified: Secondary | ICD-10-CM | POA: Diagnosis not present

## 2019-06-07 LAB — LIPID PANEL
Chol/HDL Ratio: 4.3 ratio (ref 0.0–4.4)
Cholesterol, Total: 165 mg/dL (ref 100–199)
HDL: 38 mg/dL — ABNORMAL LOW (ref >39)
LDL Chol Calc (NIH): 89 mg/dL (ref 0–99)
Triglycerides: 224 mg/dL — ABNORMAL HIGH (ref 0–149)
VLDL Cholesterol Cal: 38 mg/dL (ref 5–40)

## 2019-06-08 ENCOUNTER — Ambulatory Visit (INDEPENDENT_AMBULATORY_CARE_PROVIDER_SITE_OTHER): Payer: Medicare Other | Admitting: Interventional Cardiology

## 2019-06-08 ENCOUNTER — Encounter: Payer: Self-pay | Admitting: Interventional Cardiology

## 2019-06-08 VITALS — BP 130/60 | HR 72 | Ht 64.0 in | Wt 180.4 lb

## 2019-06-08 DIAGNOSIS — E785 Hyperlipidemia, unspecified: Secondary | ICD-10-CM

## 2019-06-08 DIAGNOSIS — I6523 Occlusion and stenosis of bilateral carotid arteries: Secondary | ICD-10-CM

## 2019-06-08 DIAGNOSIS — I119 Hypertensive heart disease without heart failure: Secondary | ICD-10-CM

## 2019-06-08 DIAGNOSIS — K219 Gastro-esophageal reflux disease without esophagitis: Secondary | ICD-10-CM | POA: Diagnosis not present

## 2019-06-08 MED ORDER — METOPROLOL SUCCINATE ER 100 MG PO TB24: ORAL_TABLET | ORAL | 3 refills | Status: DC

## 2019-06-08 MED ORDER — HYDROCHLOROTHIAZIDE 25 MG PO TABS: 37.5000 mg | ORAL_TABLET | Freq: Every day | ORAL | 3 refills | Status: DC

## 2019-06-08 MED ORDER — ROSUVASTATIN CALCIUM 20 MG PO TABS: 20.0000 mg | ORAL_TABLET | Freq: Every day | ORAL | 3 refills | Status: DC

## 2019-06-08 MED ORDER — TELMISARTAN 80 MG PO TABS: 80.0000 mg | ORAL_TABLET | Freq: Every day | ORAL | 3 refills | Status: DC

## 2019-06-08 MED ORDER — AMLODIPINE BESYLATE 2.5 MG PO TABS: 2.5000 mg | ORAL_TABLET | Freq: Every day | ORAL | 3 refills | Status: DC

## 2019-06-08 MED ORDER — VERAPAMIL HCL ER 240 MG PO TBCR: 240.0000 mg | EXTENDED_RELEASE_TABLET | Freq: Every day | ORAL | 3 refills | Status: DC

## 2019-06-08 NOTE — Progress Notes (Signed)
Cardiology Office Note   Date:  06/08/2019   ID:  [name], DOB [birth year], MRN [id number]  PCP:  Maria Dance, DO    No chief complaint on file.  PAD  Wt Readings from Last 3 Encounters:  06/08/19 180 lb 6.4 oz (81.8 kg)  04/14/19 174 lb 14.4 oz (79.3 kg)  03/31/19 174 lb (78.9 kg)       History of Present Illness: [name] is a 78 y.o. female   has a past history of dyslipidemia. She was previously followed by Dr. Mare Ferrari. She has a history of known residual right carotid bruit after remote right carotid endarterectomy.  She does not have any history of ischemic heart disease. She had a normal nuclear stress test in 01-02-04. She has had essential hypertension and obesity.   Her thyroid disease is followed by her GYN physician. She follows with vascular surgery regarding her carotid disease.  Chronic GERD sx.   In July 2020, stress test: Nuclear stress EF: 77%. The left ventricular ejection fraction is hyperdynamic (>65%).  There was no ST segment deviation noted during stress.  Defect 1: There is a small defect of mild severity present in the basal anterior, mid anterior and apical anterior location. This appears to be most consistent with breast attenuation  This is a low risk study. There is no evidence of ischemia or previous infarction. The study is normal.  In July 2020, she  Had some dizziness with turning:"had an episode of vertigo Monday assoc with N/V and elevated BP. EMS ran an EKG which was normal. High salt diet the night before. Under a lot of stress with her husband in hospice care.  Patient would like to proceed with echo and stress test to rule out cardiac source. Increase HCTZ to 25 mg 1 1/2 daily.F/U with Dr. Irish Lack after testing."  BP was high at that time.    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 Nolene Pham, who had metastatic prostate cancer. He passed away in Jan 02, 2019.    Since the last visit, she has been  dealing with stress.        Past Medical History:  Diagnosis Date  . Arthritis   . Back pain    had cortisone injection 6/15, Dr Durward Fortes  . Carotid artery occlusion   . Carotid bruit   . Diverticulosis   . GERD (gastroesophageal reflux disease)   . Hyperlipidemia   . Hypertension   . Injury of left leg 06/05/12   Pt. fell  . Peripheral vascular disease ([place])   . Shingles outbreak 2016/01/02  . Thyroid disease 02-27-12   lt. thyroid nodule, bx. done 5 yrs ago-now some enlargement is seen.    Past Surgical History:  Procedure Laterality Date  . ABDOMINAL HYSTERECTOMY  01/1980  . CAROTID ENDARTERECTOMY  1998   Stable since surgery -slight build up returning-follows with yrly Dopplers  . CATARACT EXTRACTION, BILATERAL  January 02, 2003 or January 02, 2004 per pt  . COLONOSCOPY    . EYE SURGERY  02-27-12   Bil. Cataract surgery  . THYROID LOBECTOMY  03/02/2012   Procedure: THYROID LOBECTOMY;  Surgeon: Earnstine Regal, MD;  Location: [facility];  Service: General;  Laterality: Left;     Current Outpatient Medications  Medication Sig Dispense Refill  . acetaminophen (TYLENOL) 500 MG tablet Take 1,000 mg by mouth 2 (two) times daily. For arthritis pain    . amLODipine (NORVASC) 2.5 MG tablet Take 1 tablet (2.5 mg  total) by mouth daily with breakfast. 90 tablet 3  . aspirin 81 MG EC tablet Take 81 mg by mouth daily.     . B Complex Vitamins (VITAMIN-B COMPLEX PO) Take 1 tablet by mouth daily with breakfast.     . Cholecalciferol (VITAMIN D) 50 MCG (2000 UT) tablet Take 4,000 Units by mouth daily.    Marland Kitchen docusate sodium (COLACE) 100 MG capsule Take 100 mg by mouth 2 (two) times daily.    . hydrochlorothiazide (HYDRODIURIL) 25 MG tablet Take 1.5 tablets (37.5 mg total) by mouth daily. 135 tablet 3  . KRILL OIL PO Take 500 mg by mouth daily. MEGA RED    . levothyroxine (SYNTHROID) 112 MCG tablet Take 1 tablet (112 mcg total) by mouth daily. 90 tablet 1  . Loratadine (CLARITIN) 10 MG CAPS Take 10 mg by mouth as needed  (allergies). As needed    . metoprolol succinate (TOPROL-XL) 100 MG 24 hr tablet TAKE 1 TABLET BY MOUTH DAILY WITH BREAKFAST OR IMMEDIATELY FOLLOWING A MEAL 90 tablet 3  . Multiple Vitamin (MULTIVITAMIN) tablet Take 1 tablet by mouth daily.      . rosuvastatin (CRESTOR) 20 MG tablet Take 1 tablet (20 mg total) by mouth daily. 90 tablet 3  . telmisartan (MICARDIS) 80 MG tablet Take 1 tablet (80 mg total) by mouth daily. 90 tablet 3  . verapamil (CALAN-SR) 240 MG CR tablet Take 1 tablet (240 mg total) by mouth daily with breakfast. 90 tablet 3   No current facility-administered medications for this visit.     Allergies:   Zebeta and Ace inhibitors    Social History:  The patient  reports that she quit smoking about 43 years ago. She has never used smokeless tobacco. She reports current alcohol use of about 1.0 standard drinks of alcohol per week. She reports that she does not use drugs.   Family History:  The patient's family history includes Arthritis in her mother; Atrial fibrillation in her mother; Heart disease in her brother and mother; Hyperlipidemia in her mother; Hypertension in her mother; Stroke in her mother.    ROS:  Please see the history of present illness.   Otherwise, review of systems are positive for mild lightheadedness.   All other systems are reviewed and negative.    PHYSICAL EXAM: VS:  BP 130/60   Pulse 72   Ht 5\' 4"  (1.626 m)   Wt 180 lb 6.4 oz (81.8 kg)   LMP 09/24/1979   SpO2 97%   BMI 30.97 kg/m  , BMI Body mass index is 30.97 kg/m. GEN: Well nourished, well developed, in no acute distress  HEENT: normal  Neck: no JVD, ; bilateral carotid bruits R>L;, or masses Cardiac: RRR; no murmurs, rubs, or gallops,no edema  Respiratory:  clear to auscultation bilaterally, normal work of breathing GI: soft, nontender, nondistended, + BS MS: no deformity or atrophy  Skin: warm and dry, no rash Neuro:  Strength and sensation are intact Psych: euthymic mood, full  affect     Recent Labs: 02/16/2019: TSH 5.190 03/24/2019: ALT 24 04/12/2019: BUN 20; Creatinine, Ser 0.99; Hemoglobin 12.2; Platelets 349; Potassium 4.6; Sodium 139   Lipid Panel    Component Value Date/Time   CHOL 165 06/07/2019 1001   TRIG 224 (H) 06/07/2019 1001   HDL 38 (L) 06/07/2019 1001   CHOLHDL 4.3 06/07/2019 1001   CHOLHDL 3.9 08/26/2016 1035   VLDL 32 (H) 08/26/2016 1035   LDLCALC 80 03/24/2019 1229   LDLDIRECT  73.4 01/07/2013 0939     Other studies Reviewed: Additional studies/ records that were reviewed today with results demonstrating: moderate carotid disease bilaterally.   ASSESSMENT AND PLAN:  1. Hypertensive heart disease: BP controlled on the higher dose of HCTZ 37.5 mg daily.   Continue other antihypertensives. 2. Hyperlipidemia: LDL 80 at last check.  COntinue Crestor.  Decrease sugar intake.  Increase exercise.  This should help lower TG. Spoke at length about options for lipid management.  Will contrinue current meds for now. 3. Carotid artery disease: h/o CEA.  60-79% on the right at last check.  Some increase since the prior check.0 4. GERD: Avoiding Zantac.  Using omeprazole.     Current medicines are reviewed at length with the patient today.  The patient concerns regarding her medicines were addressed.  The following changes have been made:  No change  Labs/ tests ordered today include:  No orders of the defined types were placed in this encounter.   Recommend 150 minutes/week of aerobic exercise Low fat, low carb, high fiber diet recommended  Disposition:   FU in 6 months   Signed, Larae Grooms, MD  06/08/2019 4:04 PM    [facility] [place] [address], [place], [place]  [address] Phone: [phone]; Fax: [phone]

## 2019-06-08 NOTE — Patient Instructions (Signed)
Medication Instructions:  Your physician recommends that you continue on your current medications as directed. Please refer to the Current Medication list given to you today.  If you need a refill on your cardiac medications before your next appointment, please call your pharmacy.   Lab work: None  If you have labs (blood work) drawn today and your tests are completely normal, you will receive your results only by: Marland Kitchen MyChart Message (if you have MyChart) OR . A paper copy in the mail If you have any lab test that is abnormal or we need to change your treatment, we will call you to review the results.  Testing/Procedures: none  Follow-Up: At [facility], you and your health needs are our priority.  As part of our continuing mission to provide you with exceptional heart care, we have created designated Provider Care Teams.  These Care Teams include your primary Cardiologist (physician) and Advanced Practice Providers (APPs -  Physician Assistants and Nurse Practitioners) who all work together to provide you with the care you need, when you need it. You will need a follow up appointment in 6 months.  Please call our office 2 months in advance to schedule this appointment.  You may see Larae Grooms, MD or one of the following Advanced Practice Providers on your designated Care Team:   [place], PA-C Melina Copa, PA-C . Ermalinda Barrios, PA-C  Any Other Special Instructions Will Be Listed Below (If Applicable).

## 2019-06-11 ENCOUNTER — Ambulatory Visit: Payer: Medicare Other | Admitting: Interventional Cardiology

## 2019-06-30 ENCOUNTER — Other Ambulatory Visit: Payer: Self-pay

## 2019-06-30 ENCOUNTER — Ambulatory Visit (INDEPENDENT_AMBULATORY_CARE_PROVIDER_SITE_OTHER): Payer: Medicare Other

## 2019-06-30 DIAGNOSIS — Z23 Encounter for immunization: Secondary | ICD-10-CM | POA: Diagnosis not present

## 2019-07-22 ENCOUNTER — Telehealth: Payer: Self-pay

## 2019-07-22 NOTE — Telephone Encounter (Signed)
Pt calls c/o lower abdominal pain that is alleviated with BMs.  Pt states the pain has been intermittent x 1 week.  Pt also states that she feels that it is related to divertculitis and that she does have a history of this.  Advised pt that given her history and the fact that she has a GI specialist (Dr. Cristina Gong) she should contact Dr. Osborn Coho office for recommendations or OV.  Pt expressed understanding.  Charyl Bigger, CMA

## 2019-07-26 ENCOUNTER — Other Ambulatory Visit: Payer: Self-pay

## 2019-07-26 ENCOUNTER — Ambulatory Visit (INDEPENDENT_AMBULATORY_CARE_PROVIDER_SITE_OTHER): Payer: Medicare Other | Admitting: Family Medicine

## 2019-07-26 ENCOUNTER — Encounter: Payer: Self-pay | Admitting: Family Medicine

## 2019-07-26 VITALS — BP 145/79 | HR 59 | Temp 97.6°F | Resp 10 | Ht 63.0 in | Wt 179.1 lb

## 2019-07-26 DIAGNOSIS — R142 Eructation: Secondary | ICD-10-CM

## 2019-07-26 DIAGNOSIS — R519 Headache, unspecified: Secondary | ICD-10-CM | POA: Diagnosis not present

## 2019-07-26 DIAGNOSIS — R5383 Other fatigue: Secondary | ICD-10-CM

## 2019-07-26 DIAGNOSIS — F4329 Adjustment disorder with other symptoms: Secondary | ICD-10-CM

## 2019-07-26 DIAGNOSIS — K580 Irritable bowel syndrome with diarrhea: Secondary | ICD-10-CM

## 2019-07-26 DIAGNOSIS — Z20822 Contact with and (suspected) exposure to covid-19: Secondary | ICD-10-CM

## 2019-07-26 DIAGNOSIS — R197 Diarrhea, unspecified: Secondary | ICD-10-CM | POA: Diagnosis not present

## 2019-07-26 MED ORDER — DICYCLOMINE HCL 10 MG PO CAPS: 10.0000 mg | ORAL_CAPSULE | Freq: Three times a day (TID) | ORAL | 0 refills | Status: DC

## 2019-07-26 NOTE — Progress Notes (Signed)
Telehealth office visit note for [name], D.O- at Primary Care at [facility]   I connected with current patient today and verified that I am speaking with the correct person using two identifiers.    Location of the patient: Home  Location of the provider: Office Only the patient (+/- their family members at pt's discretion) and myself were participating in the encounter - This visit type was conducted due to national recommendations for restrictions regarding the COVID-19 Pandemic (e.g. social distancing) in an effort to limit this patient's exposure and mitigate transmission in our community.  This format is felt to be most appropriate for this patient at this time.   - The patient did not have access to video technology or had technical difficulties with video requiring transitioning to audio format only. - No physical exam could be performed with this format, beyond that communicated to [place] by the patient/ family members as noted.   - Additionally my office staff/ schedulers discussed with the patient that there may be a monetary charge related to this service, depending on their medical insurance.   The patient expressed understanding, and agreed to proceed.       History of Present Illness:  I, Toni Amend, am serving as scribe for Dr. Mellody Pham.  - GI Symptoms: Diarrhea, Gas, Cramping Wishes for evaluation today to help relieve cramping and diarrhea.  Says mainly having some nausea and "upset stomach burping type thing."   Symptoms began ten days ago.  States she had lower abdominal cramping associated with some diarrhea and some stomach issues.  Notes "more burping and that kind of thing."  Says at first she thought it was a stomach bug; took some Pepto bismol; notes this alleviated some symptoms but did not do much for the diarrhea.  Called last week and tried to come in; could not schedule an appointment.  Felt like yesterday was "pretty good," but  had some more abdominal cramping, "explosive gas, and diarrhea."  Notes had two bowel movements this morning; when she wakes up she experiences stomach cramping, has explosive gas, and then has "little bits and pieces" of stool; "it's not real watery."  Says in the beginning she was having regular bowel movements.  "Diarrhea is watery, but [my stools] are formed, but in little ribbons of feces."  Has continued having unusual stools in the past 4-5 days.  She reports trying to stick to a more BRAT diet; recently added softboiled/hardboiled eggs and "that didn't seem to hurt it or anything."   Having low abdominal pain across the pubic area, "when I have the gas; I think it's related."  States this pain is right in the middle.  Her pain is worse after eating anything, "feels bloated, pressure, and has to go to the bathroom."  Better with taking pepto bismol tablets; says this helps her burping and other symptoms, but "not a whole lot for my bowels, because I kept having the same symptoms."  When she's not eating, her symptoms do subside.  Denies bad abdominal pain; comments the pain is relieved after she goes to the bathroom.  "It's much better after a bowel movement; the gas is gone and the feeling of pressure is gone."  Says "in the mornings, it seems to come quite frequently, every 40 minutes or so until I get my bowel totally empty."  Denis fever, chills.  Denies contact with anyone she knows of with COVID.  Says she's been "mainly  at home," more or less quarantined during [place].  "If I go out, I have a mask out; if anyone comes to my house, they are required to have a mask."  Denies loss of taste or smell.  Denies cough, SOB, difficulty breathing.  Denies sore throat, denies runny nose aside from "regular allergy symptoms."    Says feeling a little bit more fatigued since "I've been to the bathroom so many times and I"m not eating much."  Says when she wakes up, she has "a little bit of a low grade  headache," which she treats using tylenol in the morning and night.  Notes that the tylenol takes care of these symptoms.  Denies recent use of antibiotics.  "I haven't been on antibiotics for a long time."  Says in the month of September, her daughter visited and they had sushi; says she "went on a kick" where she ate sushi for three different weeks in the row, but hasn't had any for probably a month.  Otherwise, denies travelling or eating new or unusual foods, drinking unsanitary water, or eating pond fish.  Denies being around anyone with known GI infection, GI symptoms, fever or chills.  Denies new muscle aches or body aches.  Denies history of GI concerns. Has never had IBS in the past, "not to my knowledge."  Notes she takes metamucil and stool softeners to keep herself regular as she's gotten older, "but I've never had really a lot of bowel problems."  Says "I've never had a diverticulitis; says "they told me my last colonoscopy that I have more diverticula than I had before, which goes along with the aging process."  Denies vomiting.  Denies urinary symptoms; still urinating the same amount, stream is good, and number of times she urinates per day is the same.  Denies back pain.  - Stress after Loss of Husband Notes lost [name] in July, and "have been going through trying to get the estate settled, insurance settled, and I'm sure that's more stress, but I don't feel [more stressed]."  Confirms feeling more isolated and alone during COVID and the grieving process.  Says she is aware this is normal, and to cope with this "I've been reading a lot, doing yoga and exercise class twice per week."    GAD 7 : Generalized Anxiety Score 02/17/2019  Nervous, Anxious, on Edge 0  Control/stop worrying 0  Worry too much - different things 0  Trouble relaxing 0  Restless 0  Easily annoyed or irritable 0  Afraid - awful might happen 0  Total GAD 7 Score 0  Anxiety Difficulty Not difficult at all     Depression screen [facility] 2/9 07/26/2019 02/17/2019 07/27/2018 12/30/2017 06/24/2017  Decreased Interest 1 0 1 0 0  Down, Depressed, Hopeless 0 0 1 0 0  PHQ - 2 Score 1 0 2 0 0  Altered sleeping 0 0 0 1 2  Tired, decreased energy 1 0 0 0 1  Change in appetite 1 0 0 1 0  Feeling bad or failure about yourself  0 0 0 0 0  Trouble concentrating 0 0 0 0 0  Moving slowly or fidgety/restless 0 0 0 0 0  Suicidal thoughts 0 0 0 0 0  PHQ-9 Score 3 0 2 2 3   Difficult doing work/chores Not difficult at all - Not difficult at all Not difficult at all Not difficult at all      Impression and Recommendations:    1. Diarrhea, unspecified type  2. Gaseous regurgitation- burping   3. Acute nonintractable headache, unspecified headache type   4. Other fatigue   5. Irritable bowel syndrome type symptoms at this time with diarrhea   6. Stress and adjustment reaction- loss of husband july 2020; settling estate now etc       IBS-type Symptoms, Gaseous Regurgitation, Headache, Fatigue, Stress - Advised patient that diarrhea, nausea/vomiting, headache, muscle aches/body aches are considered positive symptoms of COVID and patient cannot be allowed to come in to clinic with these symptoms. - Extensively reviewed symptoms of COVID-19 with patient today.  - Advised patient to obtain a COVID-19 test at [facility] location. - Appropriate test site etiquette discussed with patient today. - Told patient to isolate herself until her test returns negative.  - Reviewed reported symptoms with patient during appointment today. - Extensive education provided and all questions answered.  - Once negative COVID test comes back, advised patient to come in for urinalysis to rule-out UTI, along with stool cultures in near future to rule out additional causes such as parasitic infections.  - Discussed symptoms of IBS at length with patient today, including onset after acute stress. - Advised short-term use of Bentyl for  irregular, cramping, gaseous bowels and IBS symptoms. - Advised patient to stay away from fried foods, fatty foods, and dairy. - Discussed plain, bland food options to help avoid irritating the GI tract. - Encouraged patient to eat multiple small meals through the day.  - Once patient has had no GI symptoms for 2-3 days, she may slowly begin reintroducing foods. - Patient knows to use anti-diarrheals PRN and to stay well-hydrated, replenishing electrolytes.  - Will continue to monitor.  Recommendations - Told patient to call in for further evaluation if symptoms worsen or change. - Patient understands importance of calling in to update clinic if symptoms do change. - Encouraged patient to keep upcoming appointment with gastroenterology.  - Additionally, discussion had with patient regarding our treatment plan, and their biases/concerns about that plan were used in my medical decision making today.    - The patient agreed with the plan and demonstrated an understanding of the instructions.   No barriers to understanding were identified.    - Red flag symptoms and signs discussed in detail.  Patient expressed understanding regarding what to do in case of emergency\ urgent symptoms.   - The patient was advised to call back or seek an in-person evaluation if the symptoms worsen or if the condition fails to improve as anticipated.  Return for medicare wellness next several wks before end of year.  Patient will come in in the near future for stool tests as well as UA and culture once Covid test is negative   Orders Placed This Encounter  Procedures   Clostridium Difficile by PCR([facility])   Ova and parasite examination   Stool Culture   Urine Culture   H. pylori antigen, stool    Meds ordered this encounter  Medications   dicyclomine (BENTYL) 10 MG capsule    Sig: Take 1 capsule (10 mg total) by mouth 4 (four) times daily -  before meals and at bedtime.    Dispense:  40  capsule    Refill:  0    I provided 24.5 minutes of non face-to-face time during this encounter.  Additional time was spent with charting and coordination of care after the actual visit commenced.   Note:  This note was prepared with assistance of Dragon voice recognition software. Occasional wrong-word  or sound-a-like substitutions may have occurred due to the inherent limitations of voice recognition software.  This document serves as a record of services personally performed by Maria Dance, DO. It was created on her behalf by Toni Amend, a trained medical scribe. The creation of this record is based on the scribe's personal observations and the provider's statements to them.   This case required medical decision making of at least moderate complexity. The above documentation has been reviewed to be accurate and was completed by Marjory Sneddon, D.O.       Patient Care Team    Relationship Specialty Notifications Start End  Maria Dance, DO PCP - General Family Medicine  05/10/16   Jettie Booze, MD PCP - Cardiology Cardiology Admissions 03/24/19   Jettie Booze, MD Consulting Physician Cardiology  02/14/16   Garald Balding, MD Consulting Physician Orthopedic Surgery  06/04/16   [place], Amy, MD Consulting Physician Dermatology  06/04/16   Angelia Mould, MD Consulting Physician Vascular Surgery  06/07/16    Comment: follows carotids;    Sees Nickel, Vinnie Level, NP  Ronald Lobo, MD Consulting Physician Gastroenterology  06/24/17    Comment: went for colonoscopy     -Vitals obtained; medications/ allergies reconciled;  personal medical, social, Sx etc.histories were updated by CMA, reviewed by me and are reflected in chart   Patient Active Problem List   Diagnosis Date Noted   Obesity, Class I, BMI 30-34.9 09/30/2016    Priority: High   Bilateral carotid artery disease ([place]) 06/10/2012    Priority: High   Dyslipidemia- low HDL and inc  VLDL & TG 04/04/2011    Priority: High   Benign hypertensive heart disease without heart failure 04/04/2011    Priority: High   Glucose intolerance (impaired glucose tolerance) 02/17/2019    Priority: Medium   Right carotid bruit 06/07/2016    Priority: Medium   GERD (gastroesophageal reflux disease) 06/07/2016    Priority: Medium   Moderate osteopenia 06/07/2016    Priority: Medium   Hypothyroidism, postsurgical 05/05/2012    Priority: Medium   Vitamin D deficiency 09/30/2016    Priority: Low   h/o Diverticulosis 06/07/2016    Priority: Low   Environmental and seasonal allergies 06/07/2016    Priority: Low   Arthritis 06/07/2016    Priority: Low   Diarrhea 07/26/2019   Gaseous regurgitation- burping 07/26/2019   Acute nonintractable headache 07/26/2019   Other fatigue 07/26/2019   Irritable bowel syndrome type symptoms at this time with diarrhea 07/26/2019   Stress and adjustment reaction- loss of husband july 2020; settling estate now etc 07/26/2019   Vertigo 03/24/2019   Other specified hypothyroidism 02/17/2019   Caregiver stress 02/17/2019   Caregiver stress syndrome 07/27/2018     Current Meds  Medication Sig   acetaminophen (TYLENOL) 500 MG tablet Take 1,000 mg by mouth 2 (two) times daily. For arthritis pain   amLODipine (NORVASC) 2.5 MG tablet Take 1 tablet (2.5 mg total) by mouth daily with breakfast.   aspirin 81 MG EC tablet Take 81 mg by mouth daily. Taking 2 tablets daily   B Complex Vitamins (VITAMIN-B COMPLEX PO) Take 1 tablet by mouth daily with breakfast.    Cholecalciferol (VITAMIN D) 50 MCG (2000 UT) tablet Take 4,000 Units by mouth daily.   docusate sodium (COLACE) 100 MG capsule Take 100 mg by mouth 2 (two) times daily.   hydrochlorothiazide (HYDRODIURIL) 25 MG tablet Take 1.5 tablets (37.5 mg total) by mouth daily.  KRILL OIL PO Take 500 mg by mouth daily. MEGA RED   levothyroxine (SYNTHROID) 112 MCG tablet Take 1  tablet (112 mcg total) by mouth daily.   Loratadine (CLARITIN) 10 MG CAPS Take 10 mg by mouth as needed (allergies). As needed   metoprolol succinate (TOPROL-XL) 100 MG 24 hr tablet TAKE 1 TABLET BY MOUTH DAILY WITH BREAKFAST OR IMMEDIATELY FOLLOWING A MEAL   Multiple Vitamin (MULTIVITAMIN) tablet Take 1 tablet by mouth daily.     rosuvastatin (CRESTOR) 20 MG tablet Take 1 tablet (20 mg total) by mouth daily.   telmisartan (MICARDIS) 80 MG tablet Take 1 tablet (80 mg total) by mouth daily.   verapamil (CALAN-SR) 240 MG CR tablet Take 1 tablet (240 mg total) by mouth daily with breakfast.     Allergies:  Allergies  Allergen Reactions   Zebeta Other (See Comments)    Does not work, per patient.   Ace Inhibitors Other (See Comments)    cough     ROS:  See above HPI for pertinent positives and negatives   Objective:   Blood pressure (!) 145/79, pulse (!) 59, temperature 97.6 F (36.4 C), temperature source Oral, resp. rate 10, height 5\' 3"  (1.6 m), weight 179 lb 1.6 oz (81.2 kg), last menstrual period 09/24/1979, SpO2 98 %.  (if some vitals are omitted, this means that patient was UNABLE to obtain them even though they were asked to get them prior to OV today.  They were asked to call us at their earliest convenience with these once obtained. )  General: A & O * 3; sounds in no acute distress; in usual state of health.  Skin: Pt confirms warm and dry extremities and pink fingertips HEENT: Pt confirms lips non-cyanotic Chest: Patient confirms normal chest excursion and movement Respiratory: speaking in full sentences, no conversational dyspnea; patient confirms no use of accessory muscles Psych: insight appears good, mood- appears full

## 2019-07-27 LAB — NOVEL CORONAVIRUS, NAA: SARS-CoV-2, NAA: NOT DETECTED

## 2019-07-28 DIAGNOSIS — K219 Gastro-esophageal reflux disease without esophagitis: Secondary | ICD-10-CM | POA: Diagnosis not present

## 2019-07-28 DIAGNOSIS — R197 Diarrhea, unspecified: Secondary | ICD-10-CM | POA: Diagnosis not present

## 2019-07-28 DIAGNOSIS — R1032 Left lower quadrant pain: Secondary | ICD-10-CM | POA: Diagnosis not present

## 2019-07-28 DIAGNOSIS — R1314 Dysphagia, pharyngoesophageal phase: Secondary | ICD-10-CM | POA: Diagnosis not present

## 2019-07-29 ENCOUNTER — Other Ambulatory Visit: Payer: Self-pay

## 2019-07-29 ENCOUNTER — Ambulatory Visit (INDEPENDENT_AMBULATORY_CARE_PROVIDER_SITE_OTHER): Payer: Medicare Other | Admitting: Family Medicine

## 2019-07-29 ENCOUNTER — Encounter: Payer: Self-pay | Admitting: Family Medicine

## 2019-07-29 DIAGNOSIS — K580 Irritable bowel syndrome with diarrhea: Secondary | ICD-10-CM | POA: Diagnosis not present

## 2019-07-29 DIAGNOSIS — R197 Diarrhea, unspecified: Secondary | ICD-10-CM

## 2019-07-29 DIAGNOSIS — R5383 Other fatigue: Secondary | ICD-10-CM | POA: Diagnosis not present

## 2019-07-29 DIAGNOSIS — R519 Headache, unspecified: Secondary | ICD-10-CM

## 2019-07-29 DIAGNOSIS — F4329 Adjustment disorder with other symptoms: Secondary | ICD-10-CM

## 2019-07-29 DIAGNOSIS — R142 Eructation: Secondary | ICD-10-CM | POA: Diagnosis not present

## 2019-07-29 LAB — POCT URINALYSIS DIPSTICK
Bilirubin, UA: NEGATIVE
Blood, UA: NEGATIVE
Glucose, UA: NEGATIVE
Ketones, UA: NEGATIVE
Leukocytes, UA: NEGATIVE
Nitrite, UA: NEGATIVE
Protein, UA: NEGATIVE
Spec Grav, UA: 1.025 (ref 1.010–1.025)
Urobilinogen, UA: 0.2 E.U./dL
pH, UA: 5.5 (ref 5.0–8.0)

## 2019-07-29 NOTE — Addendum Note (Signed)
Addended by: Mickel Crow on: 07/29/2019 04:47 PM   Modules accepted: Orders

## 2019-07-29 NOTE — Progress Notes (Signed)
Impression and Recommendations:    1. Diarrhea, unspecified type   2. Gaseous regurgitation- burping   3. Acute nonintractable headache, unspecified headache type   4. Other fatigue   5. Irritable bowel syndrome type symptoms at this time with diarrhea   6. Stress and adjustment reaction- loss of husband july 2020; settling estate now etc     IBS type symptoms with Diarrhea, Gaseous Regurgitation, Fatigue, Stress, Headache - Discussed patient's symptoms at length during appointment today. - Lengthy conversation held and all questions answered.  - Orders placed today for further assessment of symptoms.  See orders. - Discussed need for urinalysis and stool culture today if able.  - Blood work drawn today.  See orders.  - Patient will continue use of probiotic and Bentyl as prescribed.  See med list. -I reviewed OV notes from Deliah Goody, the physician assistant at her gastroenterologist office whom which she saw yesterday.  Essentially told patient to take a probiotic but yet follow-up with me regarding further tests etc.  She does have a follow-up with Dr. Cristina Gong her GI doc next month for esophageal stricture and other chronic GI conditions. - Advised patient to continue avoiding dairy, spicy, fried, or fatty foods, and other GI triggers. - Strongly encouraged patient to continue eating bland, GI-friendly foods.  - Discussed that there is no indication for abdominal CT during examination today.  If symptoms increase and abdomen becomes tender or painful, she certainly will let us know - Will continue to monitor and recommend further orders and imaging PRN.  - Patient will keep upcoming appointment with Gastroenterology in December.  -Of note patient had blood work through another specialist back in early July.  At that time white count was elevated at 14.9 but this was right when her husband was dying.  Repeat 20 days later revealed normal white count at 9.5.  We will  however, recheck CBC today to rule out infectious abdominal or urinary etiologies  - Will continue to monitor.    Orders Placed This Encounter  Procedures  . CBC w/Diff  . CMP (comprehensive metabolic panel)  . POC Urinalysis dipstick    Orders Placed This Encounter  Procedures  . CBC w/Diff  . CMP (comprehensive metabolic panel)  . POC Urinalysis dipstick  Will obtain urine culture today -H. pylori stool antigen -Stool culture, O&P as well as C. difficile obtained today   Gross side effects, risk and benefits, and alternatives of medications and treatment plan in general discussed with patient.  Patient is aware that all medications have potential side effects and we are unable to predict every side effect or drug-drug interaction that may occur.   Patient will call with any questions prior to using medication if they have concerns.    Expresses verbal understanding and consents to current therapy and treatment regimen.  No barriers to understanding were identified.  Red flag symptoms and signs discussed in detail.  Patient expressed understanding regarding what to do in case of emergency\urgent symptoms  Please see AVS handed out to patient at the end of our visit for further patient instructions/ counseling done pertaining to today's office visit.   No follow-ups on file.     Note:  This note was prepared with assistance of Dragon voice recognition software. Occasional wrong-word or sound-a-like substitutions may have occurred due to the inherent limitations of voice recognition software.   This document serves as a record of services personally performed by Mellody Dance, DO.  It was created on her behalf by Toni Amend, a trained medical scribe. The creation of this record is based on the scribe's personal observations and the provider's statements to them.   This case required medical decision making of at least moderate complexity. The above documentation has  been reviewed to be accurate and was completed by Marjory Sneddon, D.O.      --------------------------------------------------------------------------------------------------------------------------------------------------------------------------------------------------------------------------------------------    Subjective:    [name], am serving as scribe for Dr. Mellody Dance.  HPI: [name] is a 78 y.o. female who presents to [place] at [facility] today for issues as discussed below.  Recently obtained negative COVID test.  States she wants to feel better "so I can feel stronger again."  - GI Concerns Was recently seen via tele-visit for these symptoms.  Patient was placed on referral to GI; states called in last Thursday.  She cannot see GI until December, and notes she is keeping this appointment because she has other concerns she would like to address with Dr. Cristina Gong in the future.  Patient spoke with a PA in Dr. Buccini's practice yesterday at 3:30.  Describes her symptoms again today.  Notes symptoms "started two weeks ago today," with stomach aches, cramping and gas, and "at that point it was not diarrhea, it was formed stools, but then as it went on it became diarrhea."  States she was not as careful with her diet at that point because she thought her symptoms would be gone in 24 hours, "just a bug," but "it just kept going on and on and on."  When she talked to the PA yesterday, she was told to "add a probiotic Optician, dispensing) to my regimen," and then "do a stool specimen or urine culture" with PCP in near future.  She was told it may be an infection.  Patient was also told by GI that she may continue omeprazole as prescribed PRN for the "stricture-like feeling" in her throat.  Per patient, has been to the bathroom twice this morning already.  She took Bentyl and thinks it did help a bit.  States things were "better this morning  than it has been in 2-3 days."  Patient notes in the morning, when she stands up, she feels pressure, goes to the bathroom, and has an "explosion of gas followed by diarrhea."  When the symptoms first started, notes she just had gas with not that much pain in the morning.  Notes stools at present are "not watery; more like little tiny pieces that pile up on each other."  Notes always has gas and then stool when she has a bowel movement.    Denies recent travel, camping, etc.  Denies recent use of antibiotics.  Has felt nauseated and lightheaded at times, and "at my age, I have to remember to stand up slowly."  She continues doing yoga and cardio twice per week.  Notes she also had an episode of feeling "really dizzy" back in June.  After this, she followed up with her cardiologist for intensive evaluation, and had "everything done," including a stress test.  Notes "everything came back normal."    Wt Readings from Last 3 Encounters:  07/29/19 179 lb 11.2 oz (81.5 kg)  07/26/19 179 lb 1.6 oz (81.2 kg)  06/08/19 180 lb 6.4 oz (81.8 kg)   BP Readings from Last 3 Encounters:  07/29/19 117/70  07/26/19 (!) 145/79  06/08/19 130/60   Pulse Readings from Last 3 Encounters:  07/29/19 60  07/26/19 (!) 59  06/08/19 72   BMI Readings from Last 3 Encounters:  07/29/19 31.83 kg/m  07/26/19 31.73 kg/m  06/08/19 30.97 kg/m     Patient Care Team    Relationship Specialty Notifications Start End  Mellody Dance, DO PCP - General Family Medicine  05/10/16   Jettie Booze, MD PCP - Cardiology Cardiology Admissions 03/24/19   Jettie Booze, MD Consulting Physician Cardiology  02/14/16   Garald Balding, MD Consulting Physician Orthopedic Surgery  06/04/16   Martinique, Amy, MD Consulting Physician Dermatology  06/04/16   Angelia Mould, MD Consulting Physician Vascular Surgery  06/07/16    Comment: follows carotids;    Sees Nickel, Vinnie Level, NP  Ronald Lobo, MD Consulting  Physician Gastroenterology  06/24/17    Comment: went for colonoscopy     Patient Active Problem List   Diagnosis Date Noted  . Obesity, Class I, BMI 30-34.9 09/30/2016    Priority: High  . Bilateral carotid artery disease ([place]) 06/10/2012    Priority: High  . Dyslipidemia- low HDL and inc VLDL & TG 04/04/2011    Priority: High  . Benign hypertensive heart disease without heart failure 04/04/2011    Priority: High  . Glucose intolerance (impaired glucose tolerance) 02/17/2019    Priority: Medium  . Right carotid bruit 06/07/2016    Priority: Medium  . GERD (gastroesophageal reflux disease) 06/07/2016    Priority: Medium  . Moderate osteopenia 06/07/2016    Priority: Medium  . Hypothyroidism, postsurgical 05/05/2012    Priority: Medium  . Vitamin D deficiency 09/30/2016    Priority: Low  . h/o Diverticulosis 06/07/2016    Priority: Low  . Environmental and seasonal allergies 06/07/2016    Priority: Low  . Arthritis 06/07/2016    Priority: Low  . Diarrhea 07/26/2019  . Gaseous regurgitation- burping 07/26/2019  . Acute nonintractable headache 07/26/2019  . Other fatigue 07/26/2019  . Irritable bowel syndrome type symptoms at this time with diarrhea 07/26/2019  . Stress and adjustment reaction- loss of husband july 2020; settling estate now etc 07/26/2019  . Vertigo 03/24/2019  . Other specified hypothyroidism 02/17/2019  . Caregiver stress 02/17/2019  . Caregiver stress syndrome 07/27/2018    Past Medical history, Surgical history, Family history, Social history, Allergies and Medications have been entered into the medical record, reviewed and changed as needed.    Current Meds  Medication Sig  . acetaminophen (TYLENOL) 500 MG tablet Take 1,000 mg by mouth 2 (two) times daily. For arthritis pain  . amLODipine (NORVASC) 2.5 MG tablet Take 1 tablet (2.5 mg total) by mouth daily with breakfast.  . aspirin 81 MG EC tablet Take 81 mg by mouth daily. Taking 2 tablets daily   . B Complex Vitamins (VITAMIN-B COMPLEX PO) Take 1 tablet by mouth daily with breakfast.   . Cholecalciferol (VITAMIN D) 50 MCG (2000 UT) tablet Take 4,000 Units by mouth daily.  Marland Kitchen dicyclomine (BENTYL) 10 MG capsule Take 1 capsule (10 mg total) by mouth 4 (four) times daily -  before meals and at bedtime.  . hydrochlorothiazide (HYDRODIURIL) 25 MG tablet Take 1.5 tablets (37.5 mg total) by mouth daily.  Marland Kitchen KRILL OIL PO Take 500 mg by mouth daily. MEGA RED  . levothyroxine (SYNTHROID) 112 MCG tablet Take 1 tablet (112 mcg total) by mouth daily.  . Loratadine (CLARITIN) 10 MG CAPS Take 10 mg by mouth as needed (allergies). As needed  . metoprolol succinate (TOPROL-XL) 100 MG 24  hr tablet TAKE 1 TABLET BY MOUTH DAILY WITH BREAKFAST OR IMMEDIATELY FOLLOWING A MEAL  . Multiple Vitamin (MULTIVITAMIN) tablet Take 1 tablet by mouth daily.    . rosuvastatin (CRESTOR) 20 MG tablet Take 1 tablet (20 mg total) by mouth daily.  Marland Kitchen telmisartan (MICARDIS) 80 MG tablet Take 1 tablet (80 mg total) by mouth daily.  . verapamil (CALAN-SR) 240 MG CR tablet Take 1 tablet (240 mg total) by mouth daily with breakfast.    Allergies:  Allergies  Allergen Reactions  . Zebeta Other (See Comments)    Does not work, per patient.  . Ace Inhibitors Other (See Comments)    cough     Review of Systems:  A fourteen system review of systems was performed and found to be positive as per HPI.   Objective:   Blood pressure 117/70, pulse 60, temperature 97.9 F (36.6 C), temperature source Oral, resp. rate 10, height 5\' 3"  (1.6 m), weight 179 lb 11.2 oz (81.5 kg), last menstrual period 09/24/1979, SpO2 98 %. Body mass index is 31.83 kg/m. General:  Well Developed, well nourished, appropriate for stated age.  Neuro:  Alert and oriented,  extra-ocular muscles intact  HEENT:  Normocephalic, atraumatic, neck supple, no carotid bruits appreciated  Skin:  no gross rash, warm, pink. Cardiac:  RRR, S1 S2 Respiratory:  ECTA  B/L and A/P, Not using accessory muscles, speaking in full sentences- unlabored. Vascular:  Ext warm, no cyanosis apprec.; cap RF less 2 sec. Psych:  No HI/SI, judgement and insight good, Euthymic mood. Full Affect. Abdominal  Soft, non-tender, bowel sounds hypo-active all four quadrants, no G/R/R, no masses, no organomegaly    Recent Results (from the past 2160 hour(s))  Lipid panel     Status: Abnormal   Collection Time: 06/07/19 10:01 AM  Result Value Ref Range   Cholesterol, Total 165 100 - 199 mg/dL   Triglycerides 224 (H) 0 - 149 mg/dL   HDL 38 (L) >39 mg/dL   VLDL Cholesterol Cal 38 5 - 40 mg/dL   LDL Chol Calc (NIH) 89 0 - 99 mg/dL   Chol/HDL Ratio 4.3 0.0 - 4.4 ratio    Comment:                                   T. Chol/HDL Ratio                                             Men  Women                               1/2 Avg.Risk  3.4    3.3                                   Avg.Risk  5.0    4.4                                2X Avg.Risk  9.6    7.1  3X Avg.Risk 23.4   11.0   Novel Coronavirus, NAA ([facility])     Status: None   Collection Time: 07/26/19 12:34 PM   Specimen: Nasopharyngeal(NP) swabs in vial transport medium   NASOPHARYNGE  TESTING  Result Value Ref Range   SARS-CoV-2, NAA Not Detected Not Detected    Comment: This nucleic acid amplification test was developed and its performance characteristics determined by [employer]. Nucleic acid amplification tests include PCR and TMA. This test has not been FDA cleared or approved. This test has been authorized by FDA under an Emergency Use Authorization (EUA). This test is only authorized for the duration of time the declaration that circumstances exist justifying the authorization of the emergency use of in vitro diagnostic tests for detection of SARS-CoV-2 virus and/or diagnosis of COVID-19 infection under section 564(b)(1) of the Act, 21 U.S.C. GF:7541899) (1), unless the  authorization is terminated or revoked sooner. When diagnostic testing is negative, the possibility of a false negative result should be considered in the context of a patient's recent exposures and the presence of clinical signs and symptoms consistent with COVID-19. An individual without symptoms of COVID-19 and who is not shedding SARS-CoV-2 virus would  expect to have a negative (not detected) result in this assay.

## 2019-07-30 DIAGNOSIS — R519 Headache, unspecified: Secondary | ICD-10-CM | POA: Diagnosis not present

## 2019-07-30 DIAGNOSIS — R142 Eructation: Secondary | ICD-10-CM | POA: Diagnosis not present

## 2019-07-30 DIAGNOSIS — K580 Irritable bowel syndrome with diarrhea: Secondary | ICD-10-CM | POA: Diagnosis not present

## 2019-07-30 DIAGNOSIS — R197 Diarrhea, unspecified: Secondary | ICD-10-CM | POA: Diagnosis not present

## 2019-07-30 DIAGNOSIS — R5383 Other fatigue: Secondary | ICD-10-CM | POA: Diagnosis not present

## 2019-07-30 LAB — CBC WITH DIFFERENTIAL/PLATELET
Basophils Absolute: 0.1 10*3/uL (ref 0.0–0.2)
Basos: 1 %
EOS (ABSOLUTE): 0.3 10*3/uL (ref 0.0–0.4)
Eos: 3 %
Hematocrit: 36.1 % (ref 34.0–46.6)
Hemoglobin: 12.5 g/dL (ref 11.1–15.9)
Immature Grans (Abs): 0 10*3/uL (ref 0.0–0.1)
Immature Granulocytes: 0 %
Lymphocytes Absolute: 2.4 10*3/uL (ref 0.7–3.1)
Lymphs: 24 %
MCH: 31.7 pg (ref 26.6–33.0)
MCHC: 34.6 g/dL (ref 31.5–35.7)
MCV: 92 fL (ref 79–97)
Monocytes Absolute: 1.4 10*3/uL — ABNORMAL HIGH (ref 0.1–0.9)
Monocytes: 14 %
Neutrophils Absolute: 5.8 10*3/uL (ref 1.4–7.0)
Neutrophils: 58 %
Platelets: 368 10*3/uL (ref 150–450)
RBC: 3.94 x10E6/uL (ref 3.77–5.28)
RDW: 12.2 % (ref 11.7–15.4)
WBC: 10 10*3/uL (ref 3.4–10.8)

## 2019-07-30 LAB — COMPREHENSIVE METABOLIC PANEL
ALT: 18 IU/L (ref 0–32)
AST: 17 IU/L (ref 0–40)
Albumin/Globulin Ratio: 1.4 (ref 1.2–2.2)
Albumin: 4.3 g/dL (ref 3.7–4.7)
Alkaline Phosphatase: 80 IU/L (ref 39–117)
BUN/Creatinine Ratio: 18 (ref 12–28)
BUN: 22 mg/dL (ref 8–27)
Bilirubin Total: 0.5 mg/dL (ref 0.0–1.2)
CO2: 19 mmol/L — ABNORMAL LOW (ref 20–29)
Calcium: 9.6 mg/dL (ref 8.7–10.3)
Chloride: 97 mmol/L (ref 96–106)
Creatinine, Ser: 1.25 mg/dL — ABNORMAL HIGH (ref 0.57–1.00)
GFR calc Af Amer: 47 mL/min/{1.73_m2} — ABNORMAL LOW (ref >59)
GFR calc non Af Amer: 41 mL/min/{1.73_m2} — ABNORMAL LOW (ref >59)
Globulin, Total: 3.1 g/dL (ref 1.5–4.5)
Glucose: 96 mg/dL (ref 65–99)
Potassium: 3.8 mmol/L (ref 3.5–5.2)
Sodium: 137 mmol/L (ref 134–144)
Total Protein: 7.4 g/dL (ref 6.0–8.5)

## 2019-07-30 LAB — CLOSTRIDIUM DIFFICILE BY PCR: Toxigenic C. Difficile by PCR: NEGATIVE

## 2019-07-30 NOTE — Addendum Note (Signed)
Addended by: Mickel Crow on: 07/30/2019 11:39 AM   Modules accepted: Orders

## 2019-07-31 LAB — URINE CULTURE

## 2019-08-02 ENCOUNTER — Telehealth: Payer: Self-pay | Admitting: Family Medicine

## 2019-08-02 NOTE — Telephone Encounter (Signed)
Patient call request results for Labs & stool specimen, states her diarrhea has gotten a bit better---loose stool only in the morning but quits by mid day.  -Forwarding message to medical asst to call her @ [phone]  --glh

## 2019-08-02 NOTE — Telephone Encounter (Signed)
Please advise on results.  [name], CMA

## 2019-08-02 NOTE — Telephone Encounter (Signed)
See notes written under lab results for your response to this.  Thanks

## 2019-08-03 LAB — STOOL CULTURE: E coli, Shiga toxin Assay: NEGATIVE

## 2019-08-03 LAB — H. PYLORI ANTIGEN, STOOL: H pylori Ag, Stl: NEGATIVE

## 2019-08-03 NOTE — Telephone Encounter (Signed)
See notes under lab results.  Charyl Bigger, CMA

## 2019-08-04 LAB — OVA AND PARASITE EXAMINATION

## 2019-08-16 ENCOUNTER — Telehealth: Payer: Self-pay | Admitting: Family Medicine

## 2019-08-16 NOTE — Telephone Encounter (Signed)
Thank you for making me aware.  However this is something that needs to be handled by her gastroenterologist, there is no further testing that I will be providing for her symptoms, these will have to come from GI.

## 2019-08-16 NOTE — Telephone Encounter (Signed)
Please see below. AS, CMA 

## 2019-08-16 NOTE — Telephone Encounter (Signed)
Patient states it's been over a month & she is still having daily diarrhea, loose stool  For over a month.  --per Patient she has been following the [place] & taking the prescribed Rx, wonders now if Gluten intolerant or something else.  --Pt has appt w/ GI provider Dr. Cristina Gong on Dec 3. --patient just wanted Dr. Raliegh Scarlet to know.  --glh

## 2019-08-26 DIAGNOSIS — K591 Functional diarrhea: Secondary | ICD-10-CM | POA: Diagnosis not present

## 2019-08-26 DIAGNOSIS — K219 Gastro-esophageal reflux disease without esophagitis: Secondary | ICD-10-CM | POA: Diagnosis not present

## 2019-09-02 DIAGNOSIS — Z1159 Encounter for screening for other viral diseases: Secondary | ICD-10-CM | POA: Diagnosis not present

## 2019-09-07 DIAGNOSIS — R197 Diarrhea, unspecified: Secondary | ICD-10-CM | POA: Diagnosis not present

## 2019-09-07 DIAGNOSIS — K52831 Collagenous colitis: Secondary | ICD-10-CM | POA: Diagnosis not present

## 2019-09-10 DIAGNOSIS — K52831 Collagenous colitis: Secondary | ICD-10-CM | POA: Diagnosis not present

## 2019-09-10 LAB — HM COLONOSCOPY

## 2019-10-15 DIAGNOSIS — K52831 Collagenous colitis: Secondary | ICD-10-CM | POA: Diagnosis not present

## 2019-10-18 ENCOUNTER — Other Ambulatory Visit: Payer: Self-pay

## 2019-10-18 ENCOUNTER — Other Ambulatory Visit: Payer: Medicare Other

## 2019-10-18 ENCOUNTER — Telehealth: Payer: Self-pay | Admitting: Family Medicine

## 2019-10-18 DIAGNOSIS — E890 Postprocedural hypothyroidism: Secondary | ICD-10-CM

## 2019-10-18 DIAGNOSIS — R7302 Impaired glucose tolerance (oral): Secondary | ICD-10-CM

## 2019-10-18 DIAGNOSIS — E785 Hyperlipidemia, unspecified: Secondary | ICD-10-CM

## 2019-10-18 DIAGNOSIS — R5383 Other fatigue: Secondary | ICD-10-CM

## 2019-10-18 DIAGNOSIS — E038 Other specified hypothyroidism: Secondary | ICD-10-CM

## 2019-10-18 DIAGNOSIS — E559 Vitamin D deficiency, unspecified: Secondary | ICD-10-CM

## 2019-10-18 DIAGNOSIS — I119 Hypertensive heart disease without heart failure: Secondary | ICD-10-CM | POA: Diagnosis not present

## 2019-10-18 NOTE — Telephone Encounter (Signed)
Per Dr. Raliegh Scarlet patient does not need to have the CA 125 drawn. She has had this drawn a few times before and the results has always been within normal limits.   Left message for patient to call our office back for this information. AS< CMA

## 2019-10-18 NOTE — Telephone Encounter (Signed)
Patient is aware of the below and states she does not agree with this decision but will consult her OBGYN and see if she will order. AS, CMA

## 2019-10-18 NOTE — Telephone Encounter (Signed)
Patient came into office this am for labs. Patient is requesting to add on C125 for cancer screening. Please advise. AS, CMA

## 2019-10-18 NOTE — Telephone Encounter (Signed)
As discussed

## 2019-10-19 LAB — COMPREHENSIVE METABOLIC PANEL
ALT: 25 IU/L (ref 0–32)
AST: 19 IU/L (ref 0–40)
Albumin/Globulin Ratio: 1.8 (ref 1.2–2.2)
Albumin: 4.4 g/dL (ref 3.7–4.7)
Alkaline Phosphatase: 55 IU/L (ref 39–117)
BUN/Creatinine Ratio: 26 (ref 12–28)
BUN: 24 mg/dL (ref 8–27)
Bilirubin Total: 0.4 mg/dL (ref 0.0–1.2)
CO2: 25 mmol/L (ref 20–29)
Calcium: 9 mg/dL (ref 8.7–10.3)
Chloride: 101 mmol/L (ref 96–106)
Creatinine, Ser: 0.94 mg/dL (ref 0.57–1.00)
GFR calc Af Amer: 66 mL/min/{1.73_m2} (ref >59)
GFR calc non Af Amer: 57 mL/min/{1.73_m2} — ABNORMAL LOW (ref >59)
Globulin, Total: 2.5 g/dL (ref 1.5–4.5)
Glucose: 70 mg/dL (ref 65–99)
Potassium: 4.1 mmol/L (ref 3.5–5.2)
Sodium: 141 mmol/L (ref 134–144)
Total Protein: 6.9 g/dL (ref 6.0–8.5)

## 2019-10-19 LAB — CBC WITH DIFFERENTIAL/PLATELET
Basophils Absolute: 0.1 10*3/uL (ref 0.0–0.2)
Basos: 0 %
EOS (ABSOLUTE): 0.2 10*3/uL (ref 0.0–0.4)
Eos: 2 %
Hematocrit: 37.4 % (ref 34.0–46.6)
Hemoglobin: 12.8 g/dL (ref 11.1–15.9)
Immature Grans (Abs): 0 10*3/uL (ref 0.0–0.1)
Immature Granulocytes: 0 %
Lymphocytes Absolute: 2.5 10*3/uL (ref 0.7–3.1)
Lymphs: 22 %
MCH: 32.2 pg (ref 26.6–33.0)
MCHC: 34.2 g/dL (ref 31.5–35.7)
MCV: 94 fL (ref 79–97)
Monocytes Absolute: 1.1 10*3/uL — ABNORMAL HIGH (ref 0.1–0.9)
Monocytes: 10 %
Neutrophils Absolute: 7.3 10*3/uL — ABNORMAL HIGH (ref 1.4–7.0)
Neutrophils: 66 %
Platelets: 309 10*3/uL (ref 150–450)
RBC: 3.97 x10E6/uL (ref 3.77–5.28)
RDW: 12.5 % (ref 11.7–15.4)
WBC: 11.2 10*3/uL — ABNORMAL HIGH (ref 3.4–10.8)

## 2019-10-19 LAB — HEMOGLOBIN A1C
Est. average glucose Bld gHb Est-mCnc: 117 mg/dL
Hgb A1c MFr Bld: 5.7 % — ABNORMAL HIGH (ref 4.8–5.6)

## 2019-10-19 LAB — LIPID PANEL
Chol/HDL Ratio: 2.4 ratio (ref 0.0–4.4)
Cholesterol, Total: 147 mg/dL (ref 100–199)
HDL: 61 mg/dL (ref >39)
LDL Chol Calc (NIH): 63 mg/dL (ref 0–99)
Triglycerides: 136 mg/dL (ref 0–149)
VLDL Cholesterol Cal: 23 mg/dL (ref 5–40)

## 2019-10-19 LAB — TSH: TSH: 3.48 u[IU]/mL (ref 0.450–4.500)

## 2019-10-19 LAB — T4, FREE: Free T4: 1.51 ng/dL (ref 0.82–1.77)

## 2019-10-19 LAB — VITAMIN D 25 HYDROXY (VIT D DEFICIENCY, FRACTURES): Vit D, 25-Hydroxy: 36.9 ng/mL (ref 30.0–100.0)

## 2019-10-20 ENCOUNTER — Telehealth: Payer: Self-pay | Admitting: Obstetrics & Gynecology

## 2019-10-20 NOTE — Telephone Encounter (Signed)
Patient is asking if Dr.Miller thinks she should have a CA 125?

## 2019-10-20 NOTE — Telephone Encounter (Signed)
Spoke with patient. Patient reports she was on vaginal estrogen in the past, medication was stopped and she was followed with CA 125 labs. CA 125 WNL on 05/22/15 and  09/03/16, and 07/27/18 by PCP. Patient requested PCP draw a CA 125 at her recent AEX and was advised lab no longer needed.   Patient asking if Dr. Sabra Heck still recommends CA 125? If so, how often?   Patient denies any GYN symptoms or concerns.   Last AEX 11/24/18  Dr. Sabra Heck -please advise on CA 125.

## 2019-10-21 NOTE — Telephone Encounter (Signed)
I started doing the ca-125 levels in 2008 when she had a soft tissue mass noted on CT that Dr. Cristina Gong ultimately felt was related to diverticulosis.  We did several ultrasounds and intermittent ca-125 levels.  Last PUS was 2016 and was normal.  Last ca-125 was 2017 and was normal.  This was followed for almost 10 years and there was never any change.  I agree that it is ok to stop checking these unless has new symptoms/concerns.

## 2019-10-21 NOTE — Telephone Encounter (Signed)
Spoke with patient, advised per Dr. Sabra Heck. Patient denies any GYN concerns or symptoms at this time. AEX scheduled for 12/29/20 at 9:30am. Patient verbalizes understanding and is agreeable.   Encounter closed.

## 2019-10-25 ENCOUNTER — Other Ambulatory Visit: Payer: Self-pay | Admitting: Interventional Cardiology

## 2019-10-25 ENCOUNTER — Other Ambulatory Visit: Payer: Self-pay | Admitting: Family Medicine

## 2019-10-25 DIAGNOSIS — D225 Melanocytic nevi of trunk: Secondary | ICD-10-CM | POA: Diagnosis not present

## 2019-10-25 DIAGNOSIS — L821 Other seborrheic keratosis: Secondary | ICD-10-CM | POA: Diagnosis not present

## 2019-10-25 DIAGNOSIS — L814 Other melanin hyperpigmentation: Secondary | ICD-10-CM | POA: Diagnosis not present

## 2019-10-25 DIAGNOSIS — L0293 Carbuncle, unspecified: Secondary | ICD-10-CM | POA: Diagnosis not present

## 2019-10-25 DIAGNOSIS — E890 Postprocedural hypothyroidism: Secondary | ICD-10-CM

## 2019-10-28 ENCOUNTER — Telehealth (HOSPITAL_COMMUNITY): Payer: Self-pay

## 2019-10-28 ENCOUNTER — Other Ambulatory Visit: Payer: Self-pay

## 2019-10-28 DIAGNOSIS — I6523 Occlusion and stenosis of bilateral carotid arteries: Secondary | ICD-10-CM

## 2019-10-28 NOTE — Telephone Encounter (Signed)

## 2019-10-29 ENCOUNTER — Ambulatory Visit (INDEPENDENT_AMBULATORY_CARE_PROVIDER_SITE_OTHER): Payer: Medicare Other | Admitting: Physician Assistant

## 2019-10-29 ENCOUNTER — Ambulatory Visit (HOSPITAL_COMMUNITY)
Admission: RE | Admit: 2019-10-29 | Discharge: 2019-10-29 | Disposition: A | Discharge status: Home / Self Care | Payer: Medicare Other | Source: Ambulatory Visit | Attending: Vascular Surgery | Admitting: Vascular Surgery

## 2019-10-29 ENCOUNTER — Other Ambulatory Visit: Payer: Self-pay

## 2019-10-29 VITALS — BP 142/86 | HR 58 | Temp 96.8°F | Resp 18 | Ht 62.0 in | Wt 180.4 lb

## 2019-10-29 DIAGNOSIS — I6523 Occlusion and stenosis of bilateral carotid arteries: Secondary | ICD-10-CM

## 2019-10-29 NOTE — Progress Notes (Signed)
POST OPERATIVE OFFICE NOTE    CC:  F/u for surgery  HPI:  This is a 79 y.o. female who is s/p right carotid endarterectomy in 1998 by Dr. Amedeo Plenty; Dr. Scot Dock has been monitoring pt since Dr. Ophelia Shoulder patient has no prior history of CVA or TIAs.  She has been getting annual carotid duplex studies yearly since 2012 with findings of bilateral internal carotid artery stenoses.  Over the past 5 years, duplex ultrasound has shown stability in the degree of stenosis bilaterally.  She deniesa history of amaurosis fugax or monocular blindness, unilateral facial drooping, hemiplegia, orreceptive or expressive aphasia. Past medical history negative for diabetes mellitus, coronary artery disease Remote tobacco use.  She quit smoking in 1977 She continues her statin and low-dose aspirin (2) daily.  Allergies  Allergen Reactions  . Zebeta Other (See Comments)    Does not work, per patient.  . Ace Inhibitors Other (See Comments)    cough    Current Outpatient Medications  Medication Sig Dispense Refill  . acetaminophen (TYLENOL) 500 MG tablet Take 1,000 mg by mouth 2 (two) times daily. For arthritis pain    . amLODipine (NORVASC) 2.5 MG tablet Take 1 tablet (2.5 mg total) by mouth daily with breakfast. 90 tablet 3  . aspirin 81 MG EC tablet Take 81 mg by mouth daily. Taking 2 tablets daily    . B Complex Vitamins (VITAMIN-B COMPLEX PO) Take 1 tablet by mouth daily with breakfast.     . Cholecalciferol (VITAMIN D) 50 MCG (2000 UT) tablet Take 4,000 Units by mouth daily.    Marland Kitchen dicyclomine (BENTYL) 10 MG capsule Take 1 capsule (10 mg total) by mouth 4 (four) times daily -  before meals and at bedtime. 40 capsule 0  . docusate sodium (COLACE) 100 MG capsule Take 100 mg by mouth 2 (two) times daily.    . hydrochlorothiazide (HYDRODIURIL) 25 MG tablet Take 1.5 tablets (37.5 mg total) by mouth daily. 135 tablet 3  . KRILL OIL PO Take 500 mg by mouth daily. MEGA RED    . levothyroxine (SYNTHROID)  112 MCG tablet TAKE 1 TABLET BY MOUTH DAILY 90 tablet 1  . Loratadine (CLARITIN) 10 MG CAPS Take 10 mg by mouth as needed (allergies). As needed    . metoprolol succinate (TOPROL-XL) 100 MG 24 hr tablet TAKE 1 TABLET BY MOUTH DAILY WITH BREAKFAST OR IMMEDIATELY FOLLOWING A MEAL 90 tablet 3  . Multiple Vitamin (MULTIVITAMIN) tablet Take 1 tablet by mouth daily.      . rosuvastatin (CRESTOR) 20 MG tablet Take 1 tablet (20 mg total) by mouth daily. 90 tablet 3  . telmisartan (MICARDIS) 80 MG tablet Take 1 tablet (80 mg total) by mouth daily. 90 tablet 3  . verapamil (CALAN-SR) 240 MG CR tablet Take 1 tablet (240 mg total) by mouth daily with breakfast. 90 tablet 3   No current facility-administered medications for this visit.     ROS:  See HPI   Physical Exam:  Vitals:   10/29/19 1558  Weight: 180 lb 6.4 oz (81.8 kg)  Height: 5\' 2"  (1.575 m)   General appearance: Well-developed well-nourished female in no apparent distress Cardiovascular: Heart rate and rhythm are regular.  No murmur.  Positive right carotid artery bruit Extremities: 5/5 strength bilaterally.  Normal gait neuro: Alert and oriented x4.  Cranial nerves II through XII grossly intact Abdomen: No palpable pulsatile mass Pulse exam: 2+ dorsalis pedis pulses bilaterally.  2+ radial pulses bilaterally  July 2020:  Right Carotid: Velocities in the right ICA are consistent with a 60-79%         stenosis.   Left Carotid: Velocities in the left ICA are consistent with a 40-59%  stenosis.   Vertebrals: Bilateral vertebral arteries demonstrate antegrade flow.  Subclavians: Normal flow hemodynamics were seen in bilateral subclavian        arteries.   Today: Right Carotid Findings:  +----------+--------+--------+--------+-------------------------+--------+       PSV cm/sEDV cm/sStenosisPlaque Description    Comments  +----------+--------+--------+--------+-------------------------+--------+    CCA Prox 46   9    <50%  homogeneous             +----------+--------+--------+--------+-------------------------+--------+  CCA Mid  43   12   <50%  homogeneous and irregular      +----------+--------+--------+--------+-------------------------+--------+  CCA Distal50   14   <50%  heterogenous             +----------+--------+--------+--------+-------------------------+--------+  ICA Prox 419   77   60-79% heterogenous and calcificWith PST  +----------+--------+--------+--------+-------------------------+--------+  ICA Mid  350   42                          +----------+--------+--------+--------+-------------------------+--------+  ICA Distal106   31                          +----------+--------+--------+--------+-------------------------+--------+  ECA    90   0        heterogenous             +----------+--------+--------+--------+-------------------------+--------+   +----------+--------+-------+----------------+-------------------+       PSV cm/sEDV cmsDescribe    Arm Pressure (mmHG)  +----------+--------+-------+----------------+-------------------+  [id number]       Multiphasic, WNL            +----------+--------+-------+----------------+-------------------+   +---------+--------+--+--------+-+---------+  VertebralPSV cm/s56EDV cm/s9Antegrade  +---------+--------+--+--------+-+---------+     Left Carotid Findings:  +----------+--------+--------+--------+-------------------------+--------+       PSV cm/sEDV cm/sStenosisPlaque Description    Comments  +----------+--------+--------+--------+-------------------------+--------+  CCA Prox 93   22   <50%  heterogenous              +----------+--------+--------+--------+-------------------------+--------+  CCA Mid  99   24   <50%  heterogenous             +----------+--------+--------+--------+-------------------------+--------+  CCA Distal100   20   <50%  heterogenous             +----------+--------+--------+--------+-------------------------+--------+  ICA Prox 281   47   40-59% heterogenous       With PST  +----------+--------+--------+--------+-------------------------+--------+  ICA Mid  122   29                          +----------+--------+--------+--------+-------------------------+--------+  ICA Distal107   23                          +----------+--------+--------+--------+-------------------------+--------+  ECA    211   0    >50%  heterogenous and calcific      +----------+--------+--------+--------+-------------------------+--------+   +----------+--------+--------+---------+-------------------+       PSV cm/sEDV cm/sDescribe Arm Pressure (mmHG)  +----------+--------+--------+---------+-------------------+  [id number]       Turbulent            +----------+--------+--------+---------+-------------------+   +---------+--------+--+--------+--+---------+  VertebralPSV cm/s66EDV cm/s14Antegrade  +---------+--------+--+--------+--+---------+         Summary:  Right Carotid: Velocities in the right ICA are  consistent with a 60-79%         stenosis. Non-hemodynamically significant plaque <50% noted  in         the CCA. The ECA appears <50% stenosed.   Left Carotid: Velocities in the left ICA are consistent with a 40-59%  stenosis.        Non-hemodynamically significant plaque <50% noted in the  CCA. The        ECA appears >50% stenosed.   Vertebrals: Bilateral  vertebral arteries demonstrate antegrade flow.  Subclavians: Left subclavian artery flow was disturbed. Normal flow  hemodynamics        were seen in the right subclavian artery.   *See table(s) above for measurements and observations.      Electronically signed by Servando Snare MD on 10/29/2019 at 3:38:58 PM.      Final   Assessment/Plan:  This is a 79 y.o. female who is s/p: right carotid endarterectomy in 1998.  No symptoms of CVA or TIAs.  Today's ultrasound is consistent with ultrasound done in July 2020.  The left ICA is approximately 40 to 59% stenosed in the right ICA is approximately 60 to 79% stenosed -Continue aspirin and statin.  Follow-up with duplex ultrasound in 6 months   Barbie Banner, PA-C [facility] [phone]  Clinic MD: Donz[facility] Matters

## 2019-11-01 ENCOUNTER — Other Ambulatory Visit: Payer: Self-pay | Admitting: *Deleted

## 2019-11-01 DIAGNOSIS — I6523 Occlusion and stenosis of bilateral carotid arteries: Secondary | ICD-10-CM

## 2019-11-16 ENCOUNTER — Telehealth: Payer: Self-pay | Admitting: Family Medicine

## 2019-11-16 NOTE — Telephone Encounter (Signed)
Patients daughter calling office to request COVID antibody testing to be done in our office. Please advise. AS, CMA

## 2019-11-17 NOTE — Telephone Encounter (Signed)
Please inform pt that at present, the immunologic correlates of immunity from covid-19 infection are not well understood and standards of care regarding general population antibody screening are not recommended at this time.    Currently, Covid-19 antibody screening tests are mainly being done for academic research purposes (to study the novel corona virus), or used to determine who may qualify to donate blood that can be used to manufacture convalescent plasma as a possible treatment for those who are seriously ill from COVID-19.     Moreover, it is currently being researched how long antibodies will last in post-infected Covid patients and even IF this means immunity or not.  Because there is so still much that we do not know about this virus and its immunogenicity, there may be changes to these recommendations at some point in the future.  Hence, if patient desires to know if they currently have COVID-19 antibodies or not, I recommend they donate blood at the [employer], [facility] and other blood donation sites, as they routinely do Covid antibody screenings for free and release those results to all blood donors.      Otherwise if they insist on having it here, we will have to obtain the cost through [facility] and let patient know as most medical insurances are not paying for this.

## 2019-11-19 ENCOUNTER — Telehealth: Payer: Self-pay | Admitting: Family Medicine

## 2019-11-19 NOTE — Telephone Encounter (Signed)
Called patients daughter, Earnest Bailey back. I was reading to her the message from Dr. Raliegh Scarlet and got half way through the first paragraph before she cut me off and told me this was false information and states " I am a healthcare attorney and that the information that she is being told in incorrect and I reject this information." She also states " We are refusing to provide appropriate care to her mother" and states she would like to speak directly with Dr. Raliegh Scarlet.   I have advised Dr. Raliegh Scarlet of this verbally and Dr. Raliegh Scarlet advised for me to call the daughter back and finish relaying the message to her where she states "Otherwise if they insist on having it here, we will have to obtain the cost through [facility] and let patient know as most medical insurances are not paying for this" and per Dr. Raliegh Scarlet  " let patient daughter know we are happy to do this if they will assume the cost."  With Dr. Raliegh Scarlet here in office, I attempted to call daughter back with no answer around 11:55am and I will attempted to contact [facility] again before I leave work for the day. AS, CMA

## 2019-11-19 NOTE — Telephone Encounter (Signed)
Message left by [facility]/ pt's daughter stating she is returning med asst/ [facility]'s call  & can not be reached until after 2:15 on today Frid 2/26--- Office closes officially @ 12 non on Friday-- front desk personnel stays til 2 -3 depending on call volume.  --Forwarding message to med asst to be reviewed/addressed Monday 3/1 upon returning to office for regular work week.  --glh

## 2019-11-22 NOTE — Telephone Encounter (Signed)
Spoke with Ginger she is aware we are able to do COVID antibody test. Call transferred to front desk for scheduling. AS, CMA

## 2019-11-22 NOTE — Telephone Encounter (Signed)
Called and left message for [place]. Awaiting return call. AS, CMA

## 2019-11-22 NOTE — Telephone Encounter (Signed)
Message left by [facility]/ pt's daughter stating she is returning med asst/ Liala Codispoti's call  & can not be reached until after 2:15 on today Frid 2/26--- Office closes officially @ 12 non on Friday-- front desk personnel stays til 2 -3 depending on call volume.   --Forwarding message to med asst to be reviewed/addressed Monday 3/1 upon returning to office for regular work week.   --glh        Documentation

## 2019-11-25 ENCOUNTER — Other Ambulatory Visit: Payer: Self-pay

## 2019-11-25 ENCOUNTER — Other Ambulatory Visit: Payer: Medicare Other

## 2019-11-25 ENCOUNTER — Other Ambulatory Visit: Payer: Self-pay | Admitting: Family Medicine

## 2019-11-25 DIAGNOSIS — Z1152 Encounter for screening for COVID-19: Secondary | ICD-10-CM

## 2019-11-26 ENCOUNTER — Telehealth: Payer: Self-pay | Admitting: Family Medicine

## 2019-11-26 LAB — SARS-COV-2 ANTIBODY, IGM: SARS-CoV-2 Antibody, IgM: NEGATIVE

## 2019-11-26 NOTE — Telephone Encounter (Signed)
Patient left message while office clsd for lunch

## 2019-11-29 ENCOUNTER — Encounter: Payer: Self-pay | Admitting: Family Medicine

## 2019-12-12 NOTE — Progress Notes (Signed)
Cardiology Office Note   Date:  12/13/2019   ID:  [name], DOB 12/10/1940, MRN [id number]  PCP:  Mellody Dance, DO    No chief complaint on file.  HTN, carotid disease  Wt Readings from Last 3 Encounters:  12/13/19 181 lb 12.8 oz (82.5 kg)  10/29/19 180 lb 6.4 oz (81.8 kg)  07/29/19 179 lb 11.2 oz (81.5 kg)       History of Present Illness: [name] is a 79 y.o. female  has a past history of dyslipidemia. She was previously followed by Dr. Mare Ferrari. She has a history of known residual right carotid bruit after remote right carotid endarterectomy.  She does not have any history of ischemic heart disease. She had a normal nuclear stress test in 01-09-04. She has had essential hypertension and obesity.   Her thyroid disease is followed by her GYN physician. She follows with vascular surgery regarding her carotid disease.  Chronic GERD sx.   In July 2020, stress test: Nuclear stress EF: 77%. The left ventricular ejection fraction is hyperdynamic (>65%).  There was no ST segment deviation noted during stress.  Defect 1: There is a small defect of mild severity present in the basal anterior, mid anterior and apical anterior location. This appears to be most consistent with breast attenuation  This is a low risk study. There is no evidence of ischemia or previous infarction. The study is normal.  In July 2020, she  Had some dizziness with turning:"had an episode of vertigo Monday assoc with N/V and elevated BP. EMS ran an EKG which was normal. High salt diet the night before. Under a lot of stress with her husband in hospice care. Patient would like to proceed with echo and stress test to rule out cardiac source. Increase HCTZ to 25 mg 1 1/2 daily.F/U with Dr. Irish Lack after testing."  BP was high at that time.    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 Rayfield Citizen had metastatic prostate cancer. He passed away in 2019/01/09.    Since the  last visit, she had her COVID vaccines.  She had a negative antibody test.  She is on a steroid for collagenous colitis.     Past Medical History:  Diagnosis Date  . Arthritis   . Back pain    had cortisone injection 6/15, Dr Durward Fortes  . Carotid artery occlusion   . Carotid bruit   . Colitis, collagenous   . Diverticulosis   . GERD (gastroesophageal reflux disease)   . Hyperlipidemia   . Hypertension   . Injury of left leg 06/05/12   Pt. fell  . Peripheral vascular disease ([place])   . Shingles outbreak Jan 09, 2016  . Thyroid disease 02-27-12   lt. thyroid nodule, bx. done 5 yrs ago-now some enlargement is seen.    Past Surgical History:  Procedure Laterality Date  . ABDOMINAL HYSTERECTOMY  01/1980  . CAROTID ENDARTERECTOMY  1998   Stable since surgery -slight build up returning-follows with yrly Dopplers  . CATARACT EXTRACTION, BILATERAL  09-Jan-2003 or 01-09-2004 per pt  . COLONOSCOPY    . EYE SURGERY  02-27-12   Bil. Cataract surgery  . THYROID LOBECTOMY  03/02/2012   Procedure: THYROID LOBECTOMY;  Surgeon: Earnstine Regal, MD;  Location: [facility];  Service: General;  Laterality: Left;     Current Outpatient Medications  Medication Sig Dispense Refill  . acetaminophen (TYLENOL) 500 MG tablet Take 1,000 mg by mouth 2 (two) times  daily. For arthritis pain    . amLODipine (NORVASC) 2.5 MG tablet Take 1 tablet (2.5 mg total) by mouth daily with breakfast. 90 tablet 3  . aspirin 81 MG EC tablet Take 81 mg by mouth daily. Taking 2 tablets daily    . B Complex Vitamins (VITAMIN-B COMPLEX PO) Take 1 tablet by mouth daily with breakfast.     . Bacillus Coagulans-Inulin (ALIGN PREBIOTIC-PROBIOTIC PO) Take by mouth.    . budesonide (ENTOCORT EC) 3 MG 24 hr capsule Take by mouth daily.    . Cholecalciferol (VITAMIN D) 50 MCG (2000 UT) tablet Take 4,000 Units by mouth daily.    Marland Kitchen dicyclomine (BENTYL) 10 MG capsule Take 1 capsule (10 mg total) by mouth 4 (four) times daily -  before meals and at bedtime. 40  capsule 0  . docusate sodium (COLACE) 100 MG capsule Take 100 mg by mouth 2 (two) times daily.    . hydrochlorothiazide (HYDRODIURIL) 25 MG tablet Take 1.5 tablets (37.5 mg total) by mouth daily. 135 tablet 3  . KRILL OIL PO Take 500 mg by mouth daily. MEGA RED    . levothyroxine (SYNTHROID) 112 MCG tablet TAKE 1 TABLET BY MOUTH DAILY 90 tablet 1  . Loratadine (CLARITIN) 10 MG CAPS Take 10 mg by mouth as needed (allergies). As needed    . metoprolol succinate (TOPROL-XL) 100 MG 24 hr tablet TAKE 1 TABLET BY MOUTH DAILY WITH BREAKFAST OR IMMEDIATELY FOLLOWING A MEAL 90 tablet 3  . Multiple Vitamin (MULTIVITAMIN) tablet Take 1 tablet by mouth daily.      . rosuvastatin (CRESTOR) 20 MG tablet Take 1 tablet (20 mg total) by mouth daily. 90 tablet 3  . telmisartan (MICARDIS) 80 MG tablet Take 1 tablet (80 mg total) by mouth daily. 90 tablet 3  . verapamil (CALAN-SR) 240 MG CR tablet Take 1 tablet (240 mg total) by mouth daily with breakfast. 90 tablet 3   No current facility-administered medications for this visit.    Allergies:   Zebeta and Ace inhibitors    Social History:  The patient  reports that she quit smoking about 44 years ago. She has never used smokeless tobacco. She reports current alcohol use of about 1.0 standard drinks of alcohol per week. She reports that she does not use drugs.   Family History:  The patient's \ family history includes Arthritis in her mother; Atrial fibrillation in her mother; Heart disease in her brother and mother; Hyperlipidemia in her mother; Hypertension in her mother; Stroke in her mother.    ROS:  Please see the history of present illness.   Otherwise, review of systems are positive for diarrhea which is worse off of the steroids.   All other systems are reviewed and negative.    PHYSICAL EXAM: VS:  BP (!) 142/64   Pulse 67   Ht 5\' 2"  (1.575 m)   Wt 181 lb 12.8 oz (82.5 kg)   LMP 09/24/1979   SpO2 97%   BMI 33.25 kg/m  , BMI Body mass index is  33.25 kg/m. GEN: Well nourished, well developed, in no acute distress  HEENT: normal  Neck: no JVD, carotid bruits, or masses Cardiac: RRR; no murmurs, rubs, or gallops,no edema  Respiratory:  clear to auscultation bilaterally, normal work of breathing GI: soft, nontender, nondistended, + BS MS: no deformity or atrophy  Skin: warm and dry, no rash Neuro:  Strength and sensation are intact Psych: euthymic mood, full affect   EKG:  The ekg ordered in 2020 demonstrates NSR, no ST segment   Recent Labs: 10/18/2019: ALT 25; BUN 24; Creatinine, Ser 0.94; Hemoglobin 12.8; Platelets 309; Potassium 4.1; Sodium 141; TSH 3.480   Lipid Panel    Component Value Date/Time   CHOL 147 10/18/2019 0952   TRIG 136 10/18/2019 0952   HDL 61 10/18/2019 0952   CHOLHDL 2.4 10/18/2019 0952   CHOLHDL 3.9 08/26/2016 1035   VLDL 32 (H) 08/26/2016 1035   LDLCALC 63 10/18/2019 0952   LDLDIRECT 73.4 01/07/2013 0939     Other studies Reviewed: Additional studies/ records that were reviewed today with results demonstrating: LDL 80 in 2020; HDL 61 in Jan 2021.   ASSESSMENT AND PLAN:  1.   Hypertensive heart disease: Control improved with higher dose of HCTZ in the past.  Exercising more.  Readings are in the 120-140 range. Normal LV function in July 2020. She should continue to check BP at home. Avoid prepackaged foods. 2.   Hyperlipidemia: Continue Crestor given peripheral arterial disease with LDL target less than 100. 3.   Carotid artery disease: History of carotid endarterectomy.  Moderate disease in the right carotid in the past. 4.   GERD: Using omeprazole to treat symptoms.     Current medicines are reviewed at length with the patient today.  The patient concerns regarding her medicines were addressed.  The following changes have been made:  No change  Labs/ tests ordered today include:  No orders of the defined types were placed in this encounter.   Recommend 150 minutes/week of aerobic  exercise Low fat, low carb, high fiber diet recommended  Disposition:   FU in 6 months   Signed, Larae Grooms, MD  12/13/2019 9:28 AM    [facility] [place], [place], [place]  [address] Phone: [phone]; Fax: [phone]

## 2019-12-13 ENCOUNTER — Encounter: Payer: Self-pay | Admitting: Interventional Cardiology

## 2019-12-13 ENCOUNTER — Other Ambulatory Visit: Payer: Self-pay

## 2019-12-13 ENCOUNTER — Ambulatory Visit: Payer: Medicare Other | Admitting: Interventional Cardiology

## 2019-12-13 VITALS — BP 142/64 | HR 67 | Ht 62.0 in | Wt 181.8 lb

## 2019-12-13 DIAGNOSIS — I6523 Occlusion and stenosis of bilateral carotid arteries: Secondary | ICD-10-CM

## 2019-12-13 DIAGNOSIS — I119 Hypertensive heart disease without heart failure: Secondary | ICD-10-CM

## 2019-12-13 NOTE — Patient Instructions (Signed)
Medication Instructions:  Your physician recommends that you continue on your current medications as directed. Please refer to the Current Medication list given to you today.  *If you need a refill on your cardiac medications before your next appointment, please call your pharmacy*   Lab Work: None ordered  If you have labs (blood work) drawn today and your tests are completely normal, you will receive your results only by: . MyChart Message (if you have MyChart) OR . A paper copy in the mail If you have any lab test that is abnormal or we need to change your treatment, we will call you to review the results.   Testing/Procedures: None ordered   Follow-Up: At [facility], you and your health needs are our priority.  As part of our continuing mission to provide you with exceptional heart care, we have created designated Provider Care Teams.  These Care Teams include your primary Cardiologist (physician) and Advanced Practice Providers (APPs -  Physician Assistants and Nurse Practitioners) who all work together to provide you with the care you need, when you need it.  We recommend signing up for the patient portal called "MyChart".  Sign up information is provided on this After Visit Summary.  MyChart is used to connect with patients for Virtual Visits (Telemedicine).  Patients are able to view lab/test results, encounter notes, upcoming appointments, etc.  Non-urgent messages can be sent to your provider as well.   To learn more about what you can do with MyChart, go to [email or web address].    Your next appointment:   6 month(s)  The format for your next appointment:   In Person  Provider:   You may see Jayadeep Varanasi, MD or one of the following Advanced Practice Providers on your designated Care Team:    Dayna Dunn, PA-C  Michele Lenze, PA-C    Other Instructions   

## 2020-02-02 DIAGNOSIS — Z961 Presence of intraocular lens: Secondary | ICD-10-CM | POA: Diagnosis not present

## 2020-02-02 DIAGNOSIS — H04123 Dry eye syndrome of bilateral lacrimal glands: Secondary | ICD-10-CM | POA: Diagnosis not present

## 2020-02-02 DIAGNOSIS — Z83511 Family history of glaucoma: Secondary | ICD-10-CM | POA: Diagnosis not present

## 2020-02-02 DIAGNOSIS — H524 Presbyopia: Secondary | ICD-10-CM | POA: Diagnosis not present

## 2020-02-04 ENCOUNTER — Other Ambulatory Visit: Payer: Self-pay | Admitting: Obstetrics & Gynecology

## 2020-02-04 DIAGNOSIS — Z1231 Encounter for screening mammogram for malignant neoplasm of breast: Secondary | ICD-10-CM

## 2020-02-18 DIAGNOSIS — K52831 Collagenous colitis: Secondary | ICD-10-CM | POA: Diagnosis not present

## 2020-02-28 ENCOUNTER — Telehealth: Payer: Self-pay | Admitting: Interventional Cardiology

## 2020-02-28 ENCOUNTER — Telehealth: Payer: Self-pay | Admitting: Physician Assistant

## 2020-02-28 NOTE — Telephone Encounter (Signed)
Called and spoke to patient. She states that Friday evening she felt a little lightheaded so she checked her pulse. She states that it was 82 bpm, but was irregular. She states that it resolved through the weekend. Denies chest pain, SOB, palpitations, syncope, or any additional Sx. She states that it was irregular again this morning. She states that she took her morning meds and it is regular now. She states that she has been having issues with her colitis and has been planning her husband's memorial. Denies excessive caffeine or alcohol use. BP has been 138/68 HR 66-82 bpm. Patient is wanting to know if she should be seen. I have asked the patient to continue to monitor and let us know if she develops any Sx. Made her aware that I will forward to Dr. Irish Lack to see if he would like to order a monitor. She verbalized understanding and thanked me for the call.

## 2020-02-28 NOTE — Telephone Encounter (Signed)
Patient called says has had irregular pulse rate since last Friday off & on running in the 80s--per pt has a call into her Cardiologist and is waiting on a response from him --Call transfer to med asst for medical advice. --glh

## 2020-02-28 NOTE — Telephone Encounter (Signed)
New message   STAT if HR is under 50 or over 120 (normal HR is 60-100 beats per minute)  What is your heart rate?82  Do you have a log of your heart rate readings (document readings)? Yes   1) Do you have any other symptoms? No

## 2020-02-28 NOTE — Telephone Encounter (Signed)
Patient is calling to follow up in regards to irregular pulse. She states she is extremely anxious. Please call.

## 2020-02-28 NOTE — Telephone Encounter (Signed)
Spoke with patient who advised that her HR has been 70-84 and that its irregular.   Advised patient that a normal adult heart rate is 60-100 bpm. Patient states "my heart rate has been irregular off and on.  Per Herb Grays- I advised patient she would need to follow up with Cardiologist or go to ED for evaluation.  Patient denies SOB, chest pain or pressure, left arm pain, jaw or neck pain.   Patient states she will wait for Cardiologist to call her. She states she "has no symptoms but just needs an EKG". I advised patient I would message Cardiologist to let them know she has called [place] and is waiting to hear from them.   Herb Grays is aware of the above and is agreeable.

## 2020-02-29 ENCOUNTER — Telehealth: Payer: Self-pay | Admitting: Interventional Cardiology

## 2020-02-29 NOTE — Telephone Encounter (Signed)
Spoke with patient. See telephone encounter from yesterday.

## 2020-02-29 NOTE — Telephone Encounter (Signed)
Patient is calling to follow up with [place] in regards to symptoms discussed on yesterday, 02/28/20. She states she is requesting to further discuss Dr. Hassell Done recommendations. Please call.

## 2020-02-29 NOTE — Telephone Encounter (Signed)
If only one episode and back to normal, would hold of on monitor unless sx become more frequent.   JV

## 2020-02-29 NOTE — Telephone Encounter (Signed)
New Message  Pt called and stated that  has been waiting on  phone call for a couple days and no one haven't called her back.

## 2020-02-29 NOTE — Telephone Encounter (Signed)
Returned call to patient. She states that she had one episode of skipped beat this morning that was picked up on her BP machine. Denies having any Sx. Instructed patient to continue to monitor and let us know if she develops any Sx. Will wait to hear from Dr. Irish Lack to see if he would like to order a monitor.

## 2020-02-29 NOTE — Telephone Encounter (Signed)
OK to get ECG with nurse visit if irregular heart beat is persisting.   JV

## 2020-03-01 ENCOUNTER — Ambulatory Visit (INDEPENDENT_AMBULATORY_CARE_PROVIDER_SITE_OTHER): Payer: Medicare Other

## 2020-03-01 ENCOUNTER — Other Ambulatory Visit: Payer: Self-pay

## 2020-03-01 VITALS — BP 120/62 | HR 75 | Ht 62.0 in | Wt 182.0 lb

## 2020-03-01 DIAGNOSIS — I499 Cardiac arrhythmia, unspecified: Secondary | ICD-10-CM | POA: Diagnosis not present

## 2020-03-01 NOTE — Progress Notes (Signed)
1.) Reason for visit: EKG for c/o skipped beats  2.) Name of MD requesting visit: Dr. Irish Lack  3.) H&P: HTN, Carotid Disease  4.) Assessment and plan per MD: Patient presents to the office for her Nurse Visit EKG for recent episodes of irregular or skipped beats. She denies having palpitations, SOB, CP, or any other Sx. She states that she has had issues with her colitis lately. EKG performed that showed NSR. Patient states that sometimes it takes a little bit for it to happen. Ran a rhythm strip and she did have a couple of PACs. Reassured patient that these are not dangerous and that she should continue to monitor and let us know if they become more frequent or if she develops any other Sx. She verbalized understanding. EKG signed by Charlcie Cradle, PA at the end of the visit.

## 2020-03-01 NOTE — Telephone Encounter (Signed)
Called and spoke to the patient. She states that she had a few episodes of skipped beats through the night. She will come in to the office this afternoon at 2 PM for a nurse visit EKG.

## 2020-03-21 ENCOUNTER — Ambulatory Visit
Admission: RE | Admit: 2020-03-21 | Discharge: 2020-03-21 | Disposition: A | Discharge status: Home / Self Care | Payer: Medicare Other | Source: Ambulatory Visit | Attending: Obstetrics & Gynecology | Admitting: Obstetrics & Gynecology

## 2020-03-21 ENCOUNTER — Other Ambulatory Visit: Payer: Self-pay

## 2020-03-21 DIAGNOSIS — Z1231 Encounter for screening mammogram for malignant neoplasm of breast: Secondary | ICD-10-CM | POA: Diagnosis not present

## 2020-04-07 ENCOUNTER — Telehealth: Payer: Self-pay

## 2020-04-07 DIAGNOSIS — E890 Postprocedural hypothyroidism: Secondary | ICD-10-CM

## 2020-04-07 MED ORDER — LEVOTHYROXINE SODIUM 112 MCG PO TABS: 112.0000 ug | ORAL_TABLET | Freq: Every day | ORAL | 0 refills | Status: DC

## 2020-04-07 NOTE — Telephone Encounter (Signed)
Please call pt to schedule appt.  No further refills until pt is seen.  T. Zhane Donlan, CMA  

## 2020-04-11 DIAGNOSIS — L281 Prurigo nodularis: Secondary | ICD-10-CM | POA: Diagnosis not present

## 2020-04-11 DIAGNOSIS — L738 Other specified follicular disorders: Secondary | ICD-10-CM | POA: Diagnosis not present

## 2020-05-02 ENCOUNTER — Ambulatory Visit: Payer: Self-pay

## 2020-05-02 ENCOUNTER — Ambulatory Visit: Payer: Medicare Other | Admitting: Orthopaedic Surgery

## 2020-05-02 ENCOUNTER — Encounter: Payer: Self-pay | Admitting: Orthopaedic Surgery

## 2020-05-02 ENCOUNTER — Other Ambulatory Visit: Payer: Self-pay

## 2020-05-02 VITALS — Ht 62.5 in | Wt 175.0 lb

## 2020-05-02 DIAGNOSIS — M1611 Unilateral primary osteoarthritis, right hip: Secondary | ICD-10-CM | POA: Diagnosis not present

## 2020-05-02 DIAGNOSIS — G8929 Other chronic pain: Secondary | ICD-10-CM

## 2020-05-02 DIAGNOSIS — M5441 Lumbago with sciatica, right side: Secondary | ICD-10-CM

## 2020-05-02 DIAGNOSIS — M25551 Pain in right hip: Secondary | ICD-10-CM

## 2020-05-02 DIAGNOSIS — M545 Low back pain, unspecified: Secondary | ICD-10-CM | POA: Insufficient documentation

## 2020-05-02 NOTE — Progress Notes (Signed)
Office Visit Note   Patient: [name]           Date of Birth: [birth year]           MRN: [id number] Visit Date: 05/02/2020              Requested by: Lorrene Reid, PA-C [place],  [place] [address] PCP: Lorrene Reid, PA-C   Assessment & Plan: Visit Diagnoses:  1. Pain in right hip   2. Unilateral primary osteoarthritis, right hip   3. Chronic right-sided low back pain with right-sided sciatica     Plan: Right lateral hip pain could be a combination of factors including referred pain from the lumbar spine.  [name] had an epidural steroid injection in 2015 that alleviated "all of her pain".  She is not sure if she was having right or left-sided symptoms.  X-rays demonstrate diffuse degenerative changes in the lumbar spine and some arthritis of her right hip.  Difficult to tell from her exam and by history whether her pain is related to her hip or her back.  There is no groin pain or anterior thigh pain.  There is buttock and right hip pain.  We will asked Dr. Ernestina Patches to evaluate for an epidural steroid injection or right hip injection and then have her follow-up consider further diagnostic testing if no improvement  Follow-Up Instructions: Return After epidural steroid injection.   Orders:  Orders Placed This Encounter  Procedures  . XR Lumbar Spine 2-3 Views  . XR Pelvis 1-2 Views  . Ambulatory referral to Physical Medicine Rehab   No orders of the defined types were placed in this encounter.     Procedures: No procedures performed   Clinical Data: No additional findings.   Subjective: Chief Complaint  Patient presents with  . Right Hip - Pain  Patient presents today for right hip pain. It has been hurting for a year. No groin pain. She states that her pain is located laterally. When she wakes in the morning to get up it feels like it will give way, and feels weak. She enjoys walking for exercise and has noticed that it causes  stiffness in her back that radiates into her hips. No pain radiates down her leg. No numbness or tingling. She takes Tylenol 1000mg  in the morning. She has a history of lower back pain that she saw Dr.Christe Pham for several years ago. She received a lower back injection at the time and it took care of the pain.  Reviewing her chart she had an epidural steroid injection in 2015.  Not sure if her pain was to the right or left lower extremity at the time.  Presently having some low back but mostly buttock and right lateral hip pain.  No numbness or tingling  HPI  Review of Systems  Constitutional: Negative for fatigue.  HENT: Negative for ear pain.   Eyes: Negative for pain.  Respiratory: Negative for cough and shortness of breath.   Cardiovascular: Negative for leg swelling.  Gastrointestinal: Positive for diarrhea. Negative for constipation.  Endocrine: Negative for cold intolerance and heat intolerance.  Genitourinary: Negative for difficulty urinating.  Musculoskeletal: Negative for joint swelling.  Skin: Negative for rash.  Allergic/Immunologic: Negative for food allergies.  Neurological: Positive for weakness.  Hematological: Bruises/bleeds easily.  Psychiatric/Behavioral: Negative for sleep disturbance.     Objective: Vital Signs: Ht 5' 2.5" (1.588 m)   Wt 175 lb (79.4 kg)   LMP  09/24/1979   BMI 31.50 kg/m   Physical Exam Constitutional:      Appearance: She is well-developed.  Eyes:     Pupils: Pupils are equal, round, and reactive to light.  Pulmonary:     Effort: Pulmonary effort is normal.  Skin:    General: Skin is warm and dry.  Neurological:     Mental Status: She is alert and oriented to person, place, and time.  Psychiatric:        Behavior: Behavior normal.     Ortho Exam awake alert and oriented x3.  Comfortable sitting.  Walks without a limp but a little bit stiff when she first gets up from a sitting position with some pain along the lateral aspect of her  right hip.  Very little discomfort with motion of her right hip but on the extreme of external rotation she had a little lateral hip pain.  No groin discomfort or thigh pain.  No knee pain.  Straight leg raise negative.  Some mild discomfort along the lateral aspect of her hip but not what she typically experiences.  No percussible tenderness of the lumbar spine.  Motor exam intact.  No use of ambulatory aid  Specialty Comments:  No specialty comments available.  Imaging: XR Lumbar Spine 2-3 Views  Result Date: 05/02/2020 Films of the lumbar spine obtained in 2 projections.  There is a very small degenerative scoliosis.  Diffuse calcification of the abdominal aorta but without obvious aneurysmal dilatation.  Diffuse degenerative disc disease of throughout the lumbar spine with narrowing of the joint space.  Very slight anterior listhesis of L4 on 5.  Facet sclerosis diffusely but mostly at L4-5 and L5-S1.  Most of the joint space narrowing is at L5-S1.  Films are consistent with advanced osteoarthritis  XR Pelvis 1-2 Views  Result Date: 05/02/2020 AP the pelvis demonstrates arthritis involving the right hip joint.  There is narrowing of the joint space and some very small osteophytes.  Left hip looks clear    PMFS History: Patient Active Problem List   Diagnosis Date Noted  . Unilateral primary osteoarthritis, right hip 05/02/2020  . Low back pain 05/02/2020  . Diarrhea 07/26/2019  . Gaseous regurgitation- burping 07/26/2019  . Acute nonintractable headache 07/26/2019  . Other fatigue 07/26/2019  . Irritable bowel syndrome type symptoms at this time with diarrhea 07/26/2019  . Stress and adjustment reaction- loss of husband july 2020; settling estate now etc 07/26/2019  . Vertigo 03/24/2019  . Glucose intolerance (impaired glucose tolerance) 02/17/2019  . Other specified hypothyroidism 02/17/2019  . Caregiver stress 02/17/2019  . Caregiver stress syndrome 07/27/2018  . Vitamin D  deficiency 09/30/2016  . Obesity, Class I, BMI 30-34.9 09/30/2016  . Right carotid bruit 06/07/2016  . h/o Diverticulosis 06/07/2016  . GERD (gastroesophageal reflux disease) 06/07/2016  . Environmental and seasonal allergies 06/07/2016  . Arthritis 06/07/2016  . Moderate osteopenia 06/07/2016  . Bilateral carotid artery disease ([place]) 06/10/2012  . 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 Dyslipidemia- low HDL and inc VLDL & TG 04/04/2011  . Benign hypertensive heart disease without heart failure 04/04/2011   Past Medical History:  Diagnosis Date  . Arthritis   . Back pain    had cortisone injection 6/15, Dr Durward Fortes  . Carotid artery occlusion   . Carotid bruit   . Colitis, collagenous   . Diverticulosis   . GERD (gastroesophageal reflux disease)   . Hyperlipidemia   . Hypertension   . Injury of left leg  06/05/12   Pt. fell  . Peripheral vascular disease ([place])   . Shingles outbreak 2017  . Thyroid disease 02-27-12   lt. thyroid nodule, bx. done 5 yrs ago-now some enlargement is seen.    Family History  Problem Relation Age of Onset  . Heart disease Mother   . Hypertension Mother   . Arthritis Mother   . Stroke Mother   . Hyperlipidemia Mother   . Atrial fibrillation Mother   . Heart disease Brother        before age 14  . Heart attack Neg Hx     Past Surgical History:  Procedure Laterality Date  . ABDOMINAL HYSTERECTOMY  01/1980  . CAROTID ENDARTERECTOMY  1998   Stable since surgery -slight build up returning-follows with yrly Dopplers  . CATARACT EXTRACTION, BILATERAL  2004 or 2005 per pt  . COLONOSCOPY    . EYE SURGERY  02-27-12   Bil. Cataract surgery  . THYROID LOBECTOMY  03/02/2012   Procedure: THYROID LOBECTOMY;  Surgeon: Earnstine Regal, MD;  Location: [facility];  Service: General;  Laterality: Left;   Social History   Occupational History  . Not on file  Tobacco Use  . Smoking status: Former Smoker    Quit date: 09/24/1975    Years since quitting: 44.6  .  Smokeless tobacco: Never Used  Vaping Use  . Vaping Use: Never used  Substance and Sexual Activity  . Alcohol use: Yes    Alcohol/week: 1.0 standard drink    Types: 1 Glasses of wine per week  . Drug use: No  . Sexual activity: Not Currently    Partners: Male    Birth control/protection: Surgical    Comment: hysterectomy

## 2020-05-08 ENCOUNTER — Telehealth: Payer: Self-pay | Admitting: Physical Medicine and Rehabilitation

## 2020-05-08 NOTE — Telephone Encounter (Signed)
Documented in referral  

## 2020-05-08 NOTE — Telephone Encounter (Signed)
Patient called. She would like an appointment with Dr. Newton.  

## 2020-05-09 DIAGNOSIS — K52831 Collagenous colitis: Secondary | ICD-10-CM | POA: Diagnosis not present

## 2020-05-22 ENCOUNTER — Ambulatory Visit: Payer: Medicare Other | Admitting: Physical Medicine and Rehabilitation

## 2020-05-22 ENCOUNTER — Encounter: Payer: Self-pay | Admitting: Physical Medicine and Rehabilitation

## 2020-05-22 ENCOUNTER — Other Ambulatory Visit: Payer: Self-pay

## 2020-05-22 ENCOUNTER — Ambulatory Visit: Payer: Self-pay

## 2020-05-22 DIAGNOSIS — M25551 Pain in right hip: Secondary | ICD-10-CM

## 2020-05-22 NOTE — Progress Notes (Signed)
Pt state right hip. Pt state standing up makes the pain worse.  Numeric Pain Rating Scale and Functional Assessment Average Pain 4   In the last MONTH (on 0-10 scale) has pain interfered with the following?  1. General activity like being  able to carry out your everyday physical activities such as walking, climbing stairs, carrying groceries, or moving a chair?  Rating(8)   +Driver, -BT, -Dye Allergies.

## 2020-06-05 MED ORDER — TRIAMCINOLONE ACETONIDE 40 MG/ML IJ SUSP
60.0000 mg | INTRAMUSCULAR | Status: AC | PRN
  Administered 2020-05-22: 60 mg via INTRA_ARTICULAR

## 2020-06-05 MED ORDER — BUPIVACAINE HCL 0.25 % IJ SOLN
4.0000 mL | INTRAMUSCULAR | Status: AC | PRN
  Administered 2020-05-22: 4 mL via INTRA_ARTICULAR

## 2020-06-05 NOTE — Progress Notes (Signed)
[name] - 79 y.o. female MRN [id number]  Date of birth: [birth year]  Office Visit Note: Visit Date: 05/22/2020 PCP: Lorrene Reid, PA-C Referred by: Lorrene Reid, PA-C  Subjective: Chief Complaint  Patient presents with  . Right Hip - Pain   HPI:  [name] is a 79 y.o. female who comes in today at the request of Dr. Joni Fears for planned Right anesthetic hip arthrogram with fluoroscopic guidance.  The patient has failed conservative care including home exercise, medications, time and activity modification.  This injection will be diagnostic and hopefully therapeutic.  Please see requesting physician notes for further details and justification.  Patient having some mixed symptoms of hip pattern versus lumbar pattern.  Depending on relief today would look at lumbar epidural injection.  ROS Otherwise per HPI.  Assessment & Plan: Visit Diagnoses:  1. Pain in right hip     Plan: No additional findings.   Meds & Orders: No orders of the defined types were placed in this encounter.   Orders Placed This Encounter  Procedures  . Large Joint Inj  . Large Joint Inj  . XR C-ARM NO REPORT    Follow-up: Return for visit to requesting physician as needed.   Procedures: Large Joint Inj: R hip joint on 05/22/2020 10:15 AM Indications: pain and diagnostic evaluation Details: 22 G needle, anterior approach  Arthrogram: Yes  Medications: 4 mL bupivacaine 0.25 %; 60 mg triamcinolone acetonide 40 MG/ML Outcome: tolerated well, no immediate complications  Arthrogram demonstrated excellent flow of contrast throughout the joint surface without extravasation or obvious defect.  The patient had relief of symptoms during the anesthetic phase of the injection.  Procedure, treatment alternatives, risks and benefits explained, specific risks discussed. Consent was given by the patient. Immediately prior to procedure a time out was called to verify the correct patient,  procedure, equipment, support staff and site/side marked as required. Patient was prepped and draped in the usual sterile fashion.      No notes on file   Clinical History: No specialty comments available.     Objective:  VS:  HT:    WT:   BMI:     BP:   HR: bpm  TEMP: ( )  RESP:  Physical Exam Constitutional:      General: She is not in acute distress.    Appearance: Normal appearance. She is not ill-appearing.  HENT:     Head: Normocephalic and atraumatic.     Right Ear: External ear normal.     Left Ear: External ear normal.  Eyes:     Extraocular Movements: Extraocular movements intact.  Cardiovascular:     Rate and Rhythm: Normal rate.     Pulses: Normal pulses.  Musculoskeletal:     Right lower leg: No edema.     Left lower leg: No edema.     Comments: Patient has good distal strength with no pain over the greater trochanters.  No clonus or focal weakness.  Does have pain with internal hip rotation.  Skin:    Findings: No erythema, lesion or rash.  Neurological:     General: No focal deficit present.     Mental Status: She is alert and oriented to person, place, and time.     Sensory: No sensory deficit.     Motor: No weakness or abnormal muscle tone.     Coordination: Coordination normal.  Psychiatric:        Mood and Affect: Mood normal.  Behavior: Behavior normal.      Imaging: No results found.

## 2020-06-08 ENCOUNTER — Other Ambulatory Visit: Payer: Self-pay | Admitting: Interventional Cardiology

## 2020-06-14 NOTE — Progress Notes (Signed)
Cardiology Office Note   Date:  06/15/2020   ID:  [name], DOB Jan 26, 1941, MRN [id number]  PCP:  Lorrene Reid, PA-C    No chief complaint on file.  PAD  Wt Readings from Last 3 Encounters:  06/15/20 183 lb (83 kg)  05/02/20 175 lb (79.4 kg)  03/01/20 182 lb (82.6 kg)       History of Present Illness: [name] is a 79 y.o. female  has a past history of dyslipidemia. She was previously followed by Dr. Mare Ferrari. She has a history of known residual right carotid bruit after remote right carotid endarterectomy.  She does not have any history of ischemic heart disease. She had a normal nuclear stress test in December 27, 2003. She has had essential hypertension and obesity.   Her thyroid disease is followed by her GYN physician. She follows with vascular surgery regarding her carotid disease.  Chronic GERD sx.   In July 2020, stress test:Nuclear stress EF: 77%. The left ventricular ejection fraction is hyperdynamic (>65%).  There was no ST segment deviation noted during stress.  Defect 1: There is a small defect of mild severity present in the basal anterior, mid anterior and apical anterior location. This appears to be most consistent with breast attenuation  This is a low risk study. There is no evidence of ischemia or previous infarction. The study is normal.  In July 2020, she Had some dizziness with turning:"had an episode of vertigo Monday assoc with N/V and elevated BP. EMS ran an EKG which was normal. High salt diet the night before. Under a lot of stress with her husband in hospice care. Patient would like to proceed with echo and stress test to rule out cardiac source. Increase HCTZ to 25 mg 1 1/2 daily.F/U with Dr. Irish Lack after testing."BP was high at that time.  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 Rayfield Citizen hadmetastatic prostate cancer. He passed away in 12-27-2018.  Since the last visit, she had her COVID vaccines.   She had a negative antibody test.  She is on a steroid for collagenous colitis. When she tapered off, it flares up.    She feels some dizziness, unsteadiness.  Worse after taking her BP meds.   She had some palpitations, and they were isolated premature beats.    Past Medical History:  Diagnosis Date  . Arthritis   . Back pain    had cortisone injection 6/15, Dr Durward Fortes  . Carotid artery occlusion   . Carotid bruit   . Colitis, collagenous   . Diverticulosis   . GERD (gastroesophageal reflux disease)   . Hyperlipidemia   . Hypertension   . Injury of left leg 06/05/12   Pt. fell  . Peripheral vascular disease ([place])   . Shingles outbreak 2015/12/27  . Thyroid disease 02-27-12   lt. thyroid nodule, bx. done 5 yrs ago-now some enlargement is seen.    Past Surgical History:  Procedure Laterality Date  . ABDOMINAL HYSTERECTOMY  01/1980  . CAROTID ENDARTERECTOMY  1998   Stable since surgery -slight build up returning-follows with yrly Dopplers  . CATARACT EXTRACTION, BILATERAL  12/27/02 or 2003-12-27 per pt  . COLONOSCOPY    . EYE SURGERY  02-27-12   Bil. Cataract surgery  . THYROID LOBECTOMY  03/02/2012   Procedure: THYROID LOBECTOMY;  Surgeon: Earnstine Regal, MD;  Location: [facility];  Service: General;  Laterality: Left;     Current Outpatient Medications  Medication Sig Dispense Refill  .  acetaminophen (TYLENOL) 500 MG tablet Take 1,000 mg by mouth daily.    Marland Kitchen amLODipine (NORVASC) 2.5 MG tablet TAKE 1 TABLET BY MOUTH DAILY WITH BREAKFAST 90 tablet 1  . aspirin 81 MG EC tablet Take 81 mg by mouth daily. Taking 2 tablets daily    . B Complex Vitamins (VITAMIN-B COMPLEX PO) Take 1 tablet by mouth daily with breakfast.     . Bacillus Coagulans-Inulin (ALIGN PREBIOTIC-PROBIOTIC PO) Take by mouth.    . budesonide (ENTOCORT EC) 3 MG 24 hr capsule Take by mouth daily.    . Cholecalciferol (VITAMIN D) 50 MCG (2000 UT) tablet Take 4,000 Units by mouth daily.    . hydrochlorothiazide (HYDRODIURIL) 25 MG  tablet Take 1.5 tablets (37.5 mg total) by mouth daily. 135 tablet 3  . KRILL OIL PO Take 500 mg by mouth daily. MEGA RED    . levothyroxine (SYNTHROID) 112 MCG tablet Take 1 tablet (112 mcg total) by mouth daily. 60 tablet 0  . melatonin 5 MG TABS Take 5 mg by mouth at bedtime.    . metoprolol succinate (TOPROL-XL) 100 MG 24 hr tablet TAKE 1 TABLET BY MOUTH DAILY WITH BREAKFAST OR IMMEDIATELY FOLLOWING A MEAL 90 tablet 3  . Multiple Vitamin (MULTIVITAMIN) tablet Take 1 tablet by mouth daily.      . mupirocin ointment (BACTROBAN) 2 % SMARTSIG:1 Application Topical 2-3 Times Daily    . rosuvastatin (CRESTOR) 20 MG tablet Take 1 tablet (20 mg total) by mouth daily. 90 tablet 3  . telmisartan (MICARDIS) 80 MG tablet Take 1 tablet (80 mg total) by mouth daily. 90 tablet 3  . verapamil (CALAN-SR) 240 MG CR tablet Take 1 tablet (240 mg total) by mouth daily with breakfast. 90 tablet 3   No current facility-administered medications for this visit.    Allergies:   Zebeta and Ace inhibitors    Social History:  The patient  reports that she quit smoking about 44 years ago. She has never used smokeless tobacco. She reports current alcohol use of about 1.0 standard drink of alcohol per week. She reports that she does not use drugs.   Family History:  The patient's family history includes Arthritis in her mother; Atrial fibrillation in her mother; Heart disease in her brother and mother; Hyperlipidemia in her mother; Hypertension in her mother; Stroke in her mother.    ROS:  Please see the history of present illness.   Otherwise, review of systems are positive for unsteadiness.   All other systems are reviewed and negative.    PHYSICAL EXAM: VS:  BP 136/66   Pulse 68   Ht 5' 2.5" (1.588 m)   Wt 183 lb (83 kg)   LMP 09/24/1979   SpO2 98%   BMI 32.94 kg/m  , BMI Body mass index is 32.94 kg/m. GEN: Well nourished, well developed, in no acute distress  HEENT: normal  Neck: no JVD,; bilateral   carotid bruits R>L Cardiac: RRR; no murmurs, rubs, or gallops,no edema  Respiratory:  clear to auscultation bilaterally, normal work of breathing GI: soft, nontender, nondistended, + BS MS: no deformity or atrophy  Skin: warm and dry, no rash Neuro:  Strength and sensation are intact Psych: euthymic mood, full affect   EKG:   The ekg ordered 6/21 demonstrates NSR   Recent Labs: 10/18/2019: ALT 25; BUN 24; Creatinine, Ser 0.94; Hemoglobin 12.8; Platelets 309; Potassium 4.1; Sodium 141; TSH 3.480   Lipid Panel    Component Value Date/Time  CHOL 147 10/18/2019 0952   TRIG 136 10/18/2019 0952   HDL 61 10/18/2019 0952   CHOLHDL 2.4 10/18/2019 0952   CHOLHDL 3.9 08/26/2016 1035   VLDL 32 (H) 08/26/2016 1035   LDLCALC 63 10/18/2019 0952   LDLDIRECT 73.4 01/07/2013 0939     Other studies Reviewed: Additional studies/ records that were reviewed today with results demonstrating: Jan labs reviewed.   ASSESSMENT AND PLAN:  1. Hypertensive heart disease: The current medical regimen is effective;  continue present plan and medications. 2. Hyperlipidemia: LDL 63.  The current medical regimen is effective;  continue present plan and medications. 3. Carotid artery disease: 2/21 Moderate unilateral carotid disease. 4. Hypothyroid: Check TSH given her feelings of unsteadiness.  5. List for PCPs given.  Check labs since she no longer has a PCP.     Current medicines are reviewed at length with the patient today.  The patient concerns regarding her medicines were addressed.  The following changes have been made:  No change  Labs/ tests ordered today include:  No orders of the defined types were placed in this encounter.   Recommend 150 minutes/week of aerobic exercise Low fat, low carb, high fiber diet recommended  Disposition:   FU in 1 year   Signed, Larae Grooms, MD  06/15/2020 11:50 AM    [facility] [place], [place], [place]   [address] Phone: [phone]; Fax: [phone]

## 2020-06-15 ENCOUNTER — Other Ambulatory Visit: Payer: Self-pay

## 2020-06-15 ENCOUNTER — Encounter: Payer: Self-pay | Admitting: Interventional Cardiology

## 2020-06-15 ENCOUNTER — Ambulatory Visit: Payer: Medicare Other | Admitting: Interventional Cardiology

## 2020-06-15 VITALS — BP 136/66 | HR 68 | Ht 62.5 in | Wt 183.0 lb

## 2020-06-15 DIAGNOSIS — I119 Hypertensive heart disease without heart failure: Secondary | ICD-10-CM | POA: Diagnosis not present

## 2020-06-15 DIAGNOSIS — E039 Hypothyroidism, unspecified: Secondary | ICD-10-CM

## 2020-06-15 DIAGNOSIS — E785 Hyperlipidemia, unspecified: Secondary | ICD-10-CM

## 2020-06-15 DIAGNOSIS — I6523 Occlusion and stenosis of bilateral carotid arteries: Secondary | ICD-10-CM

## 2020-06-15 NOTE — Patient Instructions (Addendum)
Medication Instructions:  Your physician recommends that you continue on your current medications as directed. Please refer to the Current Medication list given to you today.  *If you need a refill on your cardiac medications before your next appointment, please call your pharmacy*   Lab Work: TODAY: CBC, CMET, LIPIDS, TSH  If you have labs (blood work) drawn today and your tests are completely normal, you will receive your results only by: Marland Kitchen MyChart Message (if you have MyChart) OR . A paper copy in the mail If you have any lab test that is abnormal or we need to change your treatment, we will call you to review the results.   Testing/Procedures: None   Follow-Up: At [facility], you and your health needs are our priority.  As part of our continuing mission to provide you with exceptional heart care, we have created designated Provider Care Teams.  These Care Teams include your primary Cardiologist (physician) and Advanced Practice Providers (APPs -  Physician Assistants and Nurse Practitioners) who all work together to provide you with the care you need, when you need it.  We recommend signing up for the patient portal called "MyChart".  Sign up information is provided on this After Visit Summary.  MyChart is used to connect with patients for Virtual Visits (Telemedicine).  Patients are able to view lab/test results, encounter notes, upcoming appointments, etc.  Non-urgent messages can be sent to your provider as well.   To learn more about what you can do with MyChart, go to [email or web address].    Your next appointment:   6 month(s)  The format for your next appointment:   In Person  Provider:   You may see Larae Grooms, MD or one of the following Advanced Practice Providers on your designated Care Team:    Melina Copa, PA-C  Ermalinda Barrios, PA-C    Other Instructions None

## 2020-06-16 ENCOUNTER — Other Ambulatory Visit: Payer: Self-pay

## 2020-06-16 ENCOUNTER — Other Ambulatory Visit: Payer: Self-pay | Admitting: *Deleted

## 2020-06-16 DIAGNOSIS — E785 Hyperlipidemia, unspecified: Secondary | ICD-10-CM

## 2020-06-16 DIAGNOSIS — I119 Hypertensive heart disease without heart failure: Secondary | ICD-10-CM

## 2020-06-16 DIAGNOSIS — I6523 Occlusion and stenosis of bilateral carotid arteries: Secondary | ICD-10-CM

## 2020-06-16 LAB — CBC
Hematocrit: 37 % (ref 34.0–46.6)
Hemoglobin: 12.5 g/dL (ref 11.1–15.9)
MCH: 31.3 pg (ref 26.6–33.0)
MCHC: 33.8 g/dL (ref 31.5–35.7)
MCV: 93 fL (ref 79–97)
Platelets: 300 10*3/uL (ref 150–450)
RBC: 3.99 x10E6/uL (ref 3.77–5.28)
RDW: 14.8 % (ref 11.7–15.4)
WBC: 11.3 10*3/uL — ABNORMAL HIGH (ref 3.4–10.8)

## 2020-06-16 LAB — LIPID PANEL
Chol/HDL Ratio: 3.4 ratio (ref 0.0–4.4)
Cholesterol, Total: 172 mg/dL (ref 100–199)
HDL: 50 mg/dL (ref >39)
LDL Chol Calc (NIH): 99 mg/dL (ref 0–99)
Triglycerides: 131 mg/dL (ref 0–149)
VLDL Cholesterol Cal: 23 mg/dL (ref 5–40)

## 2020-06-16 LAB — COMPREHENSIVE METABOLIC PANEL
ALT: 26 IU/L (ref 0–32)
AST: 17 IU/L (ref 0–40)
Albumin/Globulin Ratio: 1.7 (ref 1.2–2.2)
Albumin: 4.4 g/dL (ref 3.6–4.6)
Alkaline Phosphatase: 58 IU/L (ref 44–121)
BUN/Creatinine Ratio: 19 (ref 12–28)
BUN: 20 mg/dL (ref 8–27)
Bilirubin Total: 0.5 mg/dL (ref 0.0–1.2)
CO2: 23 mmol/L (ref 20–29)
Calcium: 9.8 mg/dL (ref 8.7–10.3)
Chloride: 101 mmol/L (ref 96–106)
Creatinine, Ser: 1.03 mg/dL — ABNORMAL HIGH (ref 0.57–1.00)
GFR calc Af Amer: 59 mL/min/{1.73_m2} — ABNORMAL LOW (ref >59)
GFR calc non Af Amer: 51 mL/min/{1.73_m2} — ABNORMAL LOW (ref >59)
Globulin, Total: 2.6 g/dL (ref 1.5–4.5)
Glucose: 91 mg/dL (ref 65–99)
Potassium: 4.7 mmol/L (ref 3.5–5.2)
Sodium: 140 mmol/L (ref 134–144)
Total Protein: 7 g/dL (ref 6.0–8.5)

## 2020-06-16 LAB — TSH: TSH: 3.55 u[IU]/mL (ref 0.450–4.500)

## 2020-06-22 ENCOUNTER — Other Ambulatory Visit: Payer: Self-pay | Admitting: Physician Assistant

## 2020-06-22 DIAGNOSIS — E890 Postprocedural hypothyroidism: Secondary | ICD-10-CM

## 2020-06-26 ENCOUNTER — Other Ambulatory Visit: Payer: Self-pay

## 2020-06-26 ENCOUNTER — Ambulatory Visit (HOSPITAL_COMMUNITY)
Admission: RE | Admit: 2020-06-26 | Discharge: 2020-06-26 | Disposition: A | Discharge status: Home / Self Care | Payer: Medicare Other | Source: Ambulatory Visit | Attending: Surgery | Admitting: Surgery

## 2020-06-26 ENCOUNTER — Ambulatory Visit (INDEPENDENT_AMBULATORY_CARE_PROVIDER_SITE_OTHER): Payer: Medicare Other | Admitting: Physician Assistant

## 2020-06-26 VITALS — BP 144/63 | HR 61 | Temp 98.0°F | Resp 20 | Ht 62.5 in | Wt 181.6 lb

## 2020-06-26 DIAGNOSIS — I6523 Occlusion and stenosis of bilateral carotid arteries: Secondary | ICD-10-CM

## 2020-06-26 NOTE — Progress Notes (Signed)
Office Note     CC:  follow up Requesting Provider:  Lorrene Reid, PA-C  HPI: [name] is a 79 y.o. (24-Apr-1941) female who presents for follow up of bilateral carotid artery stenosis. She has been monitored yearly now for stable asymptomatic carotid stenosis. She has remote history of right CEA in 1998 by Dr. Amedeo Plenty. This was done also for asymptomatic high grade stenosis. She has no history of CVA or TIAs.   She denies any amaurosis fugax, slurred speech, facial drooping, upper or lower extremity weakness or numbness. She overall states that she feels great. She is trying to work on weight loss and continuing to exercise. She had some recent episodes of dizziness but determined it was due to a blood pressure medication she was taking, so this is now improved. She otherwise does her activities of daily living without difficulty  Her husband unfortunately passed away last year  The pt is on a statin for cholesterol management.  The pt is on a daily aspirin.   Other AC: none  The pt is on ACE, ARB, BB, CCB and HCTZ for hypertension.   The pt is not diabetic. Tobacco hx: former, quit 1977  Past Medical History:  Diagnosis Date  . Arthritis   . Back pain    had cortisone injection 6/15, Dr Durward Fortes  . Carotid artery occlusion   . Carotid bruit   . Colitis, collagenous   . Diverticulosis   . GERD (gastroesophageal reflux disease)   . Hyperlipidemia   . Hypertension   . Injury of left leg 06/05/12   Pt. fell  . Peripheral vascular disease ([place])   . Shingles outbreak 2017  . Thyroid disease 02-27-12   lt. thyroid nodule, bx. done 5 yrs ago-now some enlargement is seen.    Past Surgical History:  Procedure Laterality Date  . ABDOMINAL HYSTERECTOMY  01/1980  . CAROTID ENDARTERECTOMY  1998   Stable since surgery -slight build up returning-follows with yrly Dopplers  . CATARACT EXTRACTION, BILATERAL  2004 or 2005 per pt  . COLONOSCOPY    . EYE SURGERY  02-27-12   Bil.  Cataract surgery  . THYROID LOBECTOMY  03/02/2012   Procedure: THYROID LOBECTOMY;  Surgeon: Earnstine Regal, MD;  Location: [facility];  Service: General;  Laterality: Left;    Social History   Socioeconomic History  . Marital status: Married    Spouse name: Not on file  . Number of children: Not on file  . Years of education: Not on file  . Highest education level: Not on file  Occupational History  . Not on file  Tobacco Use  . Smoking status: Former Smoker    Quit date: 09/24/1975    Years since quitting: 44.7  . Smokeless tobacco: Never Used  Vaping Use  . Vaping Use: Never used  Substance and Sexual Activity  . Alcohol use: Yes    Alcohol/week: 1.0 standard drink    Types: 1 Glasses of wine per week  . Drug use: No  . Sexual activity: Not Currently    Partners: Male    Birth control/protection: Surgical    Comment: hysterectomy  Other Topics Concern  . Not on file  Social History Narrative  . Not on file   Social Determinants of Health   Financial Resource Strain:   . Difficulty of Paying Living Expenses: Not on file  Food Insecurity:   . Worried About [name] fundraiser in the Last Year: Not on file  .  Ran Out of Food in the Last Year: Not on file  Transportation Needs:   . Lack of Transportation (Medical): Not on file  . Lack of Transportation (Non-Medical): Not on file  Physical Activity:   . Days of Exercise per Week: Not on file  . Minutes of Exercise per Session: Not on file  Stress:   . Feeling of Stress : Not on file  Social Connections:   . Frequency of Communication with Friends and Family: Not on file  . Frequency of Social Gatherings with Friends and Family: Not on file  . Attends Religious Services: Not on file  . Active Member of Clubs or Organizations: Not on file  . Attends Archivist Meetings: Not on file  . Marital Status: Not on file  Intimate Partner Violence:   . Fear of Current or Ex-Partner: Not on file  . Emotionally Abused:  Not on file  . Physically Abused: Not on file  . Sexually Abused: Not on file    Family History  Problem Relation Age of Onset  . Heart disease Mother   . Hypertension Mother   . Arthritis Mother   . Stroke Mother   . Hyperlipidemia Mother   . Atrial fibrillation Mother   . Heart disease Brother        before age 45  . Heart attack Neg Hx     Current Outpatient Medications  Medication Sig Dispense Refill  . acetaminophen (TYLENOL) 500 MG tablet Take 1,000 mg by mouth daily.    Marland Kitchen amLODipine (NORVASC) 2.5 MG tablet TAKE 1 TABLET BY MOUTH DAILY WITH BREAKFAST 90 tablet 1  . aspirin 81 MG EC tablet Take 81 mg by mouth daily. Taking 2 tablets daily    . B Complex Vitamins (VITAMIN-B COMPLEX PO) Take 1 tablet by mouth daily with breakfast.     . Bacillus Coagulans-Inulin (ALIGN PREBIOTIC-PROBIOTIC PO) Take by mouth.    . budesonide (ENTOCORT EC) 3 MG 24 hr capsule Take by mouth daily.    . Cholecalciferol (VITAMIN D) 50 MCG (2000 UT) tablet Take 4,000 Units by mouth daily.    . hydrochlorothiazide (HYDRODIURIL) 25 MG tablet Take 1.5 tablets (37.5 mg total) by mouth daily. 135 tablet 3  . KRILL OIL PO Take 500 mg by mouth daily. MEGA RED    . levothyroxine (SYNTHROID) 112 MCG tablet TAKE 1 TABLET BY MOUTH DAILY 60 tablet 0  . melatonin 5 MG TABS Take 10 mg by mouth at bedtime.     . metoprolol succinate (TOPROL-XL) 100 MG 24 hr tablet TAKE 1 TABLET BY MOUTH DAILY WITH BREAKFAST OR IMMEDIATELY FOLLOWING A MEAL 90 tablet 3  . Multiple Vitamin (MULTIVITAMIN) tablet Take 1 tablet by mouth daily.      . mupirocin ointment (BACTROBAN) 2 % SMARTSIG:1 Application Topical 2-3 Times Daily    . rosuvastatin (CRESTOR) 20 MG tablet Take 1 tablet (20 mg total) by mouth daily. 90 tablet 3  . telmisartan (MICARDIS) 80 MG tablet Take 1 tablet (80 mg total) by mouth daily. 90 tablet 3  . verapamil (CALAN-SR) 240 MG CR tablet Take 1 tablet (240 mg total) by mouth daily with breakfast. 90 tablet 3   No  current facility-administered medications for this visit.    Allergies  Allergen Reactions  . Zebeta Other (See Comments)    Does not work, per patient.  . Ace Inhibitors Other (See Comments)    cough     REVIEW OF SYSTEMS:  [X]  denotes positive  finding, [ ]  denotes negative finding Cardiac  Comments:  Chest pain or chest pressure:    Shortness of breath upon exertion:    Short of breath when lying flat:    Irregular heart rhythm:        Vascular    Pain in calf, thigh, or hip brought on by ambulation:    Pain in feet at night that wakes you up from your sleep:     Blood clot in your veins:    Leg swelling:         Pulmonary    Oxygen at home:    Productive cough:     Wheezing:         Neurologic    Sudden weakness in arms or legs:     Sudden numbness in arms or legs:     Sudden onset of difficulty speaking or slurred speech:    Temporary loss of vision in one eye:     Problems with dizziness:         Gastrointestinal    Blood in stool:     Vomited blood:         Genitourinary    Burning when urinating:     Blood in urine:        Psychiatric    Major depression:         Hematologic    Bleeding problems:    Problems with blood clotting too easily:        Skin    Rashes or ulcers:        Constitutional    Fever or chills:      PHYSICAL EXAMINATION:  Vitals:   06/26/20 0937 06/26/20 0940  BP: (!) 137/57 (!) 144/63  Pulse: 61   Resp: 20   Temp: 98 F (36.7 C)   TempSrc: Temporal   SpO2: 99%   Weight: 181 lb 9.6 oz (82.4 kg)   Height: 5' 2.5" (1.588 m)     General:  WDWN in NAD; vital signs documented above Gait: Normal HENT: WNL, normocephalic Pulmonary: normal non-labored breathing , without wheezing Cardiac: regular HR, without  Murmurs without carotid bruit Abdomen: soft, NT, no masses Vascular Exam/Pulses:  Right Left  Radial 2+ (normal) 2+ (normal)  Femoral 2+ (normal) 2+ (normal)  Popliteal 2+ (normal) 2+ (normal)  DP 2+ (normal)  2+ (normal)  PT 2+ (normal) 2+ (normal)   Extremities: without ischemic changes, without Gangrene , without cellulitis; without open wounds;  Musculoskeletal: no muscle wasting or atrophy  Neurologic: A&O X 3;  No focal weakness or paresthesias are detected Psychiatric:  The pt has Normal affect.   Non-Invasive Vascular Imaging:   Right Carotid: Velocities in the right ICA are consistent with a 60-79% stenosis.   Left Carotid: Velocities in the left ICA are consistent with a 40-59% stenosis.   Vertebrals: Bilateral vertebral arteries demonstrate antegrade flow.     ASSESSMENT/PLAN:: 79 y.o. female here for follow up for bilateral carotid artery stenosis. She is s/p right CEA in 1998 for asymptomatic carotid stenosis. She remains without symptoms . Today's Carotid duplex today shows stable left ICA stenosis of 40-59% and right ICA stenosis of 60-79%. She is compliant with her Aspirin and Statin. She is on several blood pressure medications but is well managed - She will follow up in 6 months with repeat carotid duplex - reviewed signs and symptoms of stroke/ TIA and should these occur she understands to seek immediate medical attention   Damiel Barthold,  PA-C [facility] [phone]  Clinic MD: Dr. Trula Slade

## 2020-06-29 ENCOUNTER — Other Ambulatory Visit: Payer: Self-pay | Admitting: *Deleted

## 2020-06-29 DIAGNOSIS — I6523 Occlusion and stenosis of bilateral carotid arteries: Secondary | ICD-10-CM

## 2020-07-03 ENCOUNTER — Other Ambulatory Visit: Payer: Self-pay | Admitting: Interventional Cardiology

## 2020-07-11 ENCOUNTER — Telehealth: Payer: Self-pay | Admitting: Interventional Cardiology

## 2020-07-11 NOTE — Telephone Encounter (Signed)
She should continue aspirin given prior carotid artery disease.   JV

## 2020-07-11 NOTE — Telephone Encounter (Signed)
Called and made patient aware that she should continue taking ASA 81 mg QD.

## 2020-07-11 NOTE — Telephone Encounter (Signed)
Pt called and stated that she seen the new research in the media about that the baby Asprin and would like to know if she needs to still stay on it?    Best number -[phone]

## 2020-07-14 ENCOUNTER — Other Ambulatory Visit: Payer: Self-pay

## 2020-07-14 ENCOUNTER — Ambulatory Visit (INDEPENDENT_AMBULATORY_CARE_PROVIDER_SITE_OTHER): Payer: Medicare Other

## 2020-07-14 DIAGNOSIS — Z23 Encounter for immunization: Secondary | ICD-10-CM | POA: Diagnosis not present

## 2020-07-14 NOTE — Progress Notes (Signed)
Pt here for influenza vaccine.  Screening questionnaire reviewed, VIS provided to patient, and any/all patient questions answered.  T. Dianelly Ferran, CMA  

## 2020-07-24 ENCOUNTER — Other Ambulatory Visit: Payer: Self-pay | Admitting: Interventional Cardiology

## 2020-08-22 ENCOUNTER — Other Ambulatory Visit: Payer: Self-pay | Admitting: Interventional Cardiology

## 2020-08-22 ENCOUNTER — Other Ambulatory Visit: Payer: Self-pay | Admitting: Physician Assistant

## 2020-08-22 DIAGNOSIS — E890 Postprocedural hypothyroidism: Secondary | ICD-10-CM

## 2020-08-23 ENCOUNTER — Telehealth: Payer: Self-pay

## 2020-08-23 NOTE — Telephone Encounter (Signed)
Please call pt to schedule appt.  No further refills until pt is seen.  T. Jequan Shahin, CMA  

## 2020-08-23 NOTE — Telephone Encounter (Signed)
rx refill

## 2020-09-21 ENCOUNTER — Other Ambulatory Visit: Payer: Self-pay | Admitting: Physician Assistant

## 2020-09-21 DIAGNOSIS — E890 Postprocedural hypothyroidism: Secondary | ICD-10-CM

## 2020-10-03 ENCOUNTER — Encounter: Payer: Self-pay | Admitting: Physician Assistant

## 2020-10-03 ENCOUNTER — Other Ambulatory Visit: Payer: Self-pay

## 2020-10-03 ENCOUNTER — Other Ambulatory Visit: Payer: Self-pay | Admitting: Interventional Cardiology

## 2020-10-03 ENCOUNTER — Ambulatory Visit (INDEPENDENT_AMBULATORY_CARE_PROVIDER_SITE_OTHER): Payer: Medicare Other | Admitting: Physician Assistant

## 2020-10-03 VITALS — BP 125/73 | HR 62 | Ht 62.5 in | Wt 184.6 lb

## 2020-10-03 DIAGNOSIS — I119 Hypertensive heart disease without heart failure: Secondary | ICD-10-CM | POA: Diagnosis not present

## 2020-10-03 DIAGNOSIS — K52831 Collagenous colitis: Secondary | ICD-10-CM | POA: Diagnosis not present

## 2020-10-03 DIAGNOSIS — I6523 Occlusion and stenosis of bilateral carotid arteries: Secondary | ICD-10-CM

## 2020-10-03 DIAGNOSIS — E785 Hyperlipidemia, unspecified: Secondary | ICD-10-CM

## 2020-10-03 DIAGNOSIS — E890 Postprocedural hypothyroidism: Secondary | ICD-10-CM

## 2020-10-03 MED ORDER — LEVOTHYROXINE SODIUM 112 MCG PO TABS: 112.0000 ug | ORAL_TABLET | Freq: Every day | ORAL | 3 refills | Status: AC

## 2020-10-03 NOTE — Assessment & Plan Note (Signed)
-  Followed by Cardiology- Dr. Irish Lack. -Continue current medication regimen. -Reviewed most recent CMP, wnl. -Recommend to follow low sodium diet and stay well hydrated.

## 2020-10-03 NOTE — Assessment & Plan Note (Addendum)
-  Reviewed most recent TSH, wnl. -Continue current medication regimen. Provided refill. -Will continue to monitor. Advised can follow up annually since pt has been stable on current medication dose for a long period of time and should continue to follow up with cardiology and gastroenterology for other medication management.

## 2020-10-03 NOTE — Assessment & Plan Note (Addendum)
-  Followed by Cardiology. -10/29/2019 VAS US Carotid Duplex:Summary:  Right Carotid: Velocities in the right ICA are consistent with a 60-79% stenosis. Non-hemodynamically significant plaque <50% noted  in the CCA. The ECA appears <50% stenosed.  Left Carotid: Velocities in the left ICA are consistent with a 40-59%  stenosis. Non-hemodynamically significant plaque <50% noted in the  CCA. The ECA appears >50% stenosed.

## 2020-10-03 NOTE — Assessment & Plan Note (Addendum)
-  Last lipid panel: Total cholesterol 172, triglycerides 131, HDL 50, LDL 99 -On Crestor 20 mg. -Recommend to follow a heart healthy diet low in saturated transfats. -Continue to follow-up with cardiology.

## 2020-10-03 NOTE — Patient Instructions (Signed)

## 2020-10-03 NOTE — Progress Notes (Signed)
Established Patient Office Visit  Subjective:  Patient ID: [name], female    DOB: December 22, 1940  Age: 80 y.o. MRN: [id number]  CC:  Chief Complaint  Patient presents with  . Hypothyroidism    HPI [name] presents for follow up on hypothyroidism. Patient reports has been on same dose of levothyroxine for several years. Denies fatigue, palpitations, or bowel changes. Was started on budesonide for collagenous colitis a few months ago so has experienced weight gain due to increased appetite. Patient is followed by cardiology for hypertension and hyperlipidemia. Denies chest pain, palpitations, shortness of breath, dizziness or lower extremity edema.   Past Medical History:  Diagnosis Date  . Arthritis   . Back pain    had cortisone injection 6/15, Dr Durward Fortes  . Carotid artery occlusion   . Carotid bruit   . Colitis, collagenous   . Diverticulosis   . GERD (gastroesophageal reflux disease)   . Hyperlipidemia   . Hypertension   . Injury of left leg 06/05/12   Pt. fell  . Peripheral vascular disease ([place])   . Shingles outbreak 2017  . Thyroid disease 02-27-12   lt. thyroid nodule, bx. done 5 yrs ago-now some enlargement is seen.    Past Surgical History:  Procedure Laterality Date  . ABDOMINAL HYSTERECTOMY  01/1980  . CAROTID ENDARTERECTOMY  1998   Stable since surgery -slight build up returning-follows with yrly Dopplers  . CATARACT EXTRACTION, BILATERAL  2004 or 2005 per pt  . COLONOSCOPY    . EYE SURGERY  02-27-12   Bil. Cataract surgery  . THYROID LOBECTOMY  03/02/2012   Procedure: THYROID LOBECTOMY;  Surgeon: Earnstine Regal, MD;  Location: [facility];  Service: General;  Laterality: Left;    Family History  Problem Relation Age of Onset  . Heart disease Mother   . Hypertension Mother   . Arthritis Mother   . Stroke Mother   . Hyperlipidemia Mother   . Atrial fibrillation Mother   . Heart disease Brother        before age 70  . Heart attack Neg Hx      Social History   Socioeconomic History  . Marital status: Married    Spouse name: Not on file  . Number of children: Not on file  . Years of education: Not on file  . Highest education level: Not on file  Occupational History  . Not on file  Tobacco Use  . Smoking status: Former Smoker    Quit date: 09/24/1975    Years since quitting: 45.0  . Smokeless tobacco: Never Used  Vaping Use  . Vaping Use: Never used  Substance and Sexual Activity  . Alcohol use: Yes    Alcohol/week: 1.0 standard drink    Types: 1 Glasses of wine per week  . Drug use: No  . Sexual activity: Not Currently    Partners: Male    Birth control/protection: Surgical    Comment: hysterectomy  Other Topics Concern  . Not on file  Social History Narrative  . Not on file   Social Determinants of Health   Financial Resource Strain: Not on file  Food Insecurity: Not on file  Transportation Needs: Not on file  Physical Activity: Not on file  Stress: Not on file  Social Connections: Not on file  Intimate Partner Violence: Not on file    Outpatient Medications Prior to Visit  Medication Sig Dispense Refill  . acetaminophen (TYLENOL) 500 MG tablet Take 1,000  mg by mouth daily.    Marland Kitchen amLODipine (NORVASC) 2.5 MG tablet TAKE 1 TABLET BY MOUTH DAILY WITH BREAKFAST 90 tablet 1  . aspirin 81 MG EC tablet Take 81 mg by mouth daily. Taking 2 tablets daily    . B Complex Vitamins (VITAMIN-B COMPLEX PO) Take 1 tablet by mouth daily with breakfast.    . Bacillus Coagulans-Inulin (ALIGN PREBIOTIC-PROBIOTIC PO) Take by mouth.    . budesonide (ENTOCORT EC) 3 MG 24 hr capsule Take by mouth daily.    . Cholecalciferol (VITAMIN D) 50 MCG (2000 UT) tablet Take 4,000 Units by mouth daily.    . hydrochlorothiazide (HYDRODIURIL) 25 MG tablet TAKE 1 AND 1/2 TABLETS BY MOUTH DAILY (Patient taking differently: Take 37.5 mg by mouth daily.) 135 tablet 0  . KRILL OIL PO Take 500 mg by mouth daily. MEGA RED    . melatonin 5 MG  TABS Take 10 mg by mouth at bedtime.     . metoprolol succinate (TOPROL-XL) 100 MG 24 hr tablet TAKE 1 TABLET BY MOUTH DAILY WITH BREAKFAST OR IMMEDIATELY FOLLOWING A MEAL 90 tablet 3  . Multiple Vitamin (MULTIVITAMIN) tablet Take 1 tablet by mouth daily.      . mupirocin ointment (BACTROBAN) 2 % SMARTSIG:1 Application Topical 2-3 Times Daily    . rosuvastatin (CRESTOR) 20 MG tablet TAKE 1 TABLET BY MOUTH DAILY 90 tablet 3  . telmisartan (MICARDIS) 80 MG tablet TAKE 1 TABLET BY MOUTH DAILY 90 tablet 3  . verapamil (CALAN-SR) 240 MG CR tablet TAKE 1 TABLET BY MOUTH DAILY WITH BREAKFAST 90 tablet 3  . levothyroxine (SYNTHROID) 112 MCG tablet TAKE 1 TABLET BY MOUTH DAILY 30 tablet 0   No facility-administered medications prior to visit.    Allergies  Allergen Reactions  . Zebeta Other (See Comments)    Does not work, per patient.  . Ace Inhibitors Other (See Comments)    cough    ROS Review of Systems A fourteen system review of systems was performed and found to be positive as per HPI.    Objective:    Physical Exam General:  Well Developed, well nourished, in no acute distress.  Neuro:  Alert and oriented,  extra-ocular muscles intact  HEENT:  Normocephalic, atraumatic, neck supple Skin:  no gross rash, warm, pink. Cardiac:  RRR, S1 S2 wnl's, w/o murmur Respiratory:  ECTA B/L, Not using accessory muscles, speaking in full sentences- unlabored. Vascular:  Ext warm, no cyanosis apprec.; cap RF less 2 sec. Psych:  No HI/SI, judgement and insight good, Euthymic mood. Full Affect.   BP 125/73   Pulse 62   Ht 5' 2.5" (1.588 m)   Wt 184 lb 9.6 oz (83.7 kg)   LMP 09/24/1979   SpO2 97%   BMI 33.23 kg/m  Wt Readings from Last 3 Encounters:  10/03/20 184 lb 9.6 oz (83.7 kg)  06/26/20 181 lb 9.6 oz (82.4 kg)  06/15/20 183 lb (83 kg)     Health Maintenance Due  Topic Date Due  . PNA vac Low Risk Adult (2 of 2 - PPSV23) 07/30/2016  . COVID-19 Vaccine (2 - Moderna 3-dose  booster series) 11/29/2019    There are no preventive care reminders to display for this patient.  Lab Results  Component Value Date   TSH 3.550 06/15/2020   Lab Results  Component Value Date   WBC 11.3 (H) 06/16/2020   HGB 12.5 06/16/2020   HCT 37.0 06/16/2020   MCV 93 06/16/2020  PLT 300 06/16/2020   Lab Results  Component Value Date   NA 140 06/15/2020   K 4.7 06/15/2020   CO2 23 06/15/2020   GLUCOSE 91 06/15/2020   BUN 20 06/15/2020   CREATININE 1.03 (H) 06/15/2020   BILITOT 0.5 06/15/2020   ALKPHOS 58 06/15/2020   AST 17 06/15/2020   ALT 26 06/15/2020   PROT 7.0 06/15/2020   ALBUMIN 4.4 06/15/2020   CALCIUM 9.8 06/15/2020   GFR 56.66 (L) 03/31/2015   Lab Results  Component Value Date   CHOL 172 06/15/2020   Lab Results  Component Value Date   HDL 50 06/15/2020   Lab Results  Component Value Date   LDLCALC 99 06/15/2020   Lab Results  Component Value Date   TRIG 131 06/15/2020   Lab Results  Component Value Date   CHOLHDL 3.4 06/15/2020   Lab Results  Component Value Date   HGBA1C 5.7 (H) 10/18/2019      Assessment & Plan:   Problem List Items Addressed This Visit      Cardiovascular and Mediastinum   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 Irish Lack. -Continue current medication regimen. -Reviewed most recent CMP, wnl. -Recommend to follow low sodium diet and stay well hydrated.      Bilateral carotid artery disease ([facility]) (Chronic)    -Followed by Cardiology. -10/29/2019 VAS US Carotid Duplex:Summary:  Right Carotid: Velocities in the right ICA are consistent with a 60-79% stenosis. Non-hemodynamically significant plaque <50% noted  in the CCA. The ECA appears <50% stenosed.  Left Carotid: Velocities in the left ICA are consistent with a 40-59%  stenosis. Non-hemodynamically significant plaque <50% noted in the  CCA. The ECA appears >50% stenosed.         Endocrine    Hypothyroidism, postsurgical - Primary (Chronic)    -Reviewed most recent TSH, wnl. -Continue current medication regimen. Provided refill. -Will continue to monitor. Advised can follow up annually since pt has been stable on current medication dose for a long period of time and should continue to follow up with cardiology and gastroenterology for other medication management.       Relevant Medications   levothyroxine (SYNTHROID) 112 MCG tablet     Other   Dyslipidemia- low HDL and inc VLDL & TG (Chronic)    -Last lipid panel: Total cholesterol 172, triglycerides 131, HDL 50, LDL 99 -On Crestor 20 mg. -Recommend to follow a heart healthy diet low in saturated transfats. -Continue to follow-up with cardiology.       Other Visit Diagnoses    Collagenous colitis         Collagenous colitis: -Followed by Gastroenterology. -Continue current medication regimen.  Of note, advised patient to schedule Medicare wellness for this year.  Patient plans to call back to schedule.  Meds ordered this encounter  Medications  . levothyroxine (SYNTHROID) 112 MCG tablet    Sig: Take 1 tablet (112 mcg total) by mouth daily.    Dispense:  90 tablet    Refill:  3    Order Specific Question:   Supervising Provider    Answer:   Beatrice Lecher D [[id number]]    Follow-up: Return in about 1 year (around 10/03/2021) for Hypothyroid.   Note:  This note was prepared with assistance of Dragon voice recognition software. Occasional wrong-word or sound-a-like substitutions may have occurred due to the inherent limitations of voice recognition software.  Lorrene Reid, PA-C

## 2020-10-26 DIAGNOSIS — Z13828 Encounter for screening for other musculoskeletal disorder: Secondary | ICD-10-CM | POA: Diagnosis not present

## 2020-10-26 DIAGNOSIS — M8580 Other specified disorders of bone density and structure, unspecified site: Secondary | ICD-10-CM | POA: Diagnosis not present

## 2020-10-26 DIAGNOSIS — E890 Postprocedural hypothyroidism: Secondary | ICD-10-CM | POA: Diagnosis not present

## 2020-10-26 DIAGNOSIS — I6523 Occlusion and stenosis of bilateral carotid arteries: Secondary | ICD-10-CM | POA: Diagnosis not present

## 2020-10-26 DIAGNOSIS — Z741 Need for assistance with personal care: Secondary | ICD-10-CM | POA: Diagnosis not present

## 2020-10-26 DIAGNOSIS — N1831 Chronic kidney disease, stage 3a: Secondary | ICD-10-CM | POA: Diagnosis not present

## 2020-10-26 DIAGNOSIS — I129 Hypertensive chronic kidney disease with stage 1 through stage 4 chronic kidney disease, or unspecified chronic kidney disease: Secondary | ICD-10-CM | POA: Diagnosis not present

## 2020-10-31 DIAGNOSIS — L281 Prurigo nodularis: Secondary | ICD-10-CM | POA: Diagnosis not present

## 2020-10-31 DIAGNOSIS — D225 Melanocytic nevi of trunk: Secondary | ICD-10-CM | POA: Diagnosis not present

## 2020-10-31 DIAGNOSIS — L821 Other seborrheic keratosis: Secondary | ICD-10-CM | POA: Diagnosis not present

## 2020-10-31 DIAGNOSIS — L570 Actinic keratosis: Secondary | ICD-10-CM | POA: Diagnosis not present

## 2020-10-31 DIAGNOSIS — D692 Other nonthrombocytopenic purpura: Secondary | ICD-10-CM | POA: Diagnosis not present

## 2020-11-02 ENCOUNTER — Other Ambulatory Visit: Payer: Self-pay | Admitting: Internal Medicine

## 2020-11-02 DIAGNOSIS — M8580 Other specified disorders of bone density and structure, unspecified site: Secondary | ICD-10-CM

## 2020-11-07 ENCOUNTER — Other Ambulatory Visit: Payer: Self-pay

## 2020-11-07 ENCOUNTER — Ambulatory Visit
Admission: RE | Admit: 2020-11-07 | Discharge: 2020-11-07 | Disposition: A | Discharge status: Home / Self Care | Payer: Medicare Other | Source: Ambulatory Visit | Attending: Internal Medicine | Admitting: Internal Medicine

## 2020-11-07 DIAGNOSIS — M8580 Other specified disorders of bone density and structure, unspecified site: Secondary | ICD-10-CM

## 2020-11-07 DIAGNOSIS — Z780 Asymptomatic menopausal state: Secondary | ICD-10-CM | POA: Diagnosis not present

## 2020-11-07 DIAGNOSIS — K52831 Collagenous colitis: Secondary | ICD-10-CM | POA: Diagnosis not present

## 2020-11-07 DIAGNOSIS — M85852 Other specified disorders of bone density and structure, left thigh: Secondary | ICD-10-CM | POA: Diagnosis not present

## 2020-12-11 NOTE — Progress Notes (Signed)
Cardiology Office Note   Date:  12/12/2020   ID:  [name], [name] 12/16/1940, MRN [id number]  PCP:  Michael Boston, MD    No chief complaint on file.  PAD  Wt Readings from Last 3 Encounters:  12/12/20 186 lb (84.4 kg)  10/03/20 184 lb 9.6 oz (83.7 kg)  06/26/20 181 lb 9.6 oz (82.4 kg)       History of Present Illness: [name] is a 80 y.o. female  whohas a past history of dyslipidemia. She was previously followed by Dr. Mare Ferrari. She has a history of known residual right carotid bruit after remote right carotid endarterectomy.  She had a normal nuclear stress test in Jan 04, 2004. She has had essential hypertension and obesity.   Her thyroid disease is followed by her GYN physician. She follows with vascular surgery regarding her carotid disease.  Chronic GERD sx.   In July 2020, stress test:Nuclear stress EF: 77%. The left ventricular ejection fraction is hyperdynamic (>65%).  There was no ST segment deviation noted during stress.  Defect 1: There is a small defect of mild severity present in the basal anterior, mid anterior and apical anterior location. This appears to be most consistent with breast attenuation  This is a low risk study. There is no evidence of ischemia or previous infarction. The study is normal.  In July 2020, she Had some dizziness with turning:"had an episode of vertigo Monday assoc with N/V and elevated BP. EMS ran an EKG which was normal. High salt diet the night before. Under a lot of stress with her husband in hospice care. Patient would like to proceed with echo and stress test to rule out cardiac source. Increase HCTZ to 25 mg 1 1/2 daily.F/U with Dr. Irish Lack after testing."BP was high at that time.  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 Rayfield Pham hadmetastatic prostate cancer. He passed away in 01-04-2019.  Since the last visit, she had her COVID vaccines. She had a negative antibody test. She is  on a steroid for collagenous colitis. When she tapered off, it flares up.    In the past, she felt some dizziness, unsteadiness.  Worse after taking her BP meds. She had some palpitations, and they were isolated premature beats. 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  She repeats these exercise several times a week.  No cardiovascular sx.    Denies : Chest pain. Dizziness. Leg edema. Nitroglycerin use. Orthopnea. Palpitations. Paroxysmal nocturnal dyspnea. Shortness of breath. Syncope.     Past Medical History:  Diagnosis Date  . Arthritis   . Back pain    had cortisone injection 6/15, Dr Durward Fortes  . Carotid artery occlusion   . Carotid bruit   . Colitis, collagenous   . Diverticulosis   . GERD (gastroesophageal reflux disease)   . Hyperlipidemia   . Hypertension   . Injury of left leg 06/05/12   Pt. fell  . Peripheral vascular disease ([place])   . Shingles outbreak 01/04/2016  . Thyroid disease 02-27-12   lt. thyroid nodule, bx. done 5 yrs ago-now some enlargement is seen.    Past Surgical History:  Procedure Laterality Date  . ABDOMINAL HYSTERECTOMY  01/1980  . CAROTID ENDARTERECTOMY  1998   Stable since surgery -slight build up returning-follows with yrly Dopplers  . CATARACT EXTRACTION, BILATERAL  2003-01-04 or 01-04-2004 per pt  . COLONOSCOPY    . EYE SURGERY  02-27-12   Bil. Cataract  surgery  . THYROID LOBECTOMY  03/02/2012   Procedure: THYROID LOBECTOMY;  Surgeon: Earnstine Regal, MD;  Location: [facility];  Service: General;  Laterality: Left;     Current Outpatient Medications  Medication Sig Dispense Refill  . acetaminophen (TYLENOL) 500 MG tablet Take 1,000 mg by mouth 2 (two) times daily.    Marland Kitchen amLODipine (NORVASC) 2.5 MG tablet TAKE 1 TABLET BY MOUTH DAILY WITH BREAKFAST 90 tablet 1  . aspirin 81 MG EC tablet Take 81 mg by mouth daily.    . B Complex Vitamins (VITAMIN-B COMPLEX PO) Take 1 tablet by mouth daily with breakfast.    . Bacillus Coagulans-Inulin (ALIGN  PREBIOTIC-PROBIOTIC PO) Take 1 capsule by mouth at bedtime.    . budesonide (ENTOCORT EC) 3 MG 24 hr capsule Take 3 mg by mouth every other day.    . Cholecalciferol (VITAMIN D) 50 MCG (2000 UT) tablet Take 4,000 Units by mouth daily.    . hydrochlorothiazide (HYDRODIURIL) 25 MG tablet TAKE 1 AND 1/2 TABLETS BY MOUTH DAILY 135 tablet 2  . KRILL OIL PO Take 500 mg by mouth daily. MEGA RED    . levothyroxine (SYNTHROID) 112 MCG tablet Take 1 tablet (112 mcg total) by mouth daily. 90 tablet 3  . melatonin 5 MG TABS Take 10 mg by mouth at bedtime.     . metoprolol succinate (TOPROL-XL) 100 MG 24 hr tablet TAKE 1 TABLET BY MOUTH DAILY WITH BREAKFAST OR IMMEDIATELY FOLLOWING A MEAL 90 tablet 3  . Multiple Vitamin (MULTIVITAMIN) tablet Take 1 tablet by mouth daily.      . mupirocin ointment (BACTROBAN) 2 % SMARTSIG:1 Application Topical 2-3 Times Daily    . oxybutynin (DITROPAN-XL) 10 MG 24 hr tablet Take 10 mg by mouth at bedtime.    . rosuvastatin (CRESTOR) 20 MG tablet TAKE 1 TABLET BY MOUTH DAILY 90 tablet 3  . telmisartan (MICARDIS) 80 MG tablet TAKE 1 TABLET BY MOUTH DAILY 90 tablet 3  . verapamil (CALAN-SR) 240 MG CR tablet TAKE 1 TABLET BY MOUTH DAILY WITH BREAKFAST 90 tablet 3   No current facility-administered medications for this visit.    Allergies:   Zebeta and Ace inhibitors    Social History:  The patient  reports that she quit smoking about 45 years ago. She has never used smokeless tobacco. She reports current alcohol use of about 1.0 standard drink of alcohol per week. She reports that she does not use drugs.   Family History:  The patient's family history includes Arthritis in her mother; Atrial fibrillation in her mother; Heart disease in her brother and mother; Hyperlipidemia in her mother; Hypertension in her mother; Stroke in her mother.    ROS:  Please see the history of present illness.   Otherwise, review of systems are positive for seasonal allergies and overactive  bladder.   All other systems are reviewed and negative.    PHYSICAL EXAM: VS:  BP (!) 120/50   Pulse 63   Ht 5\' 2"  (1.575 m)   Wt 186 lb (84.4 kg)   LMP 09/24/1979   SpO2 98%   BMI 34.02 kg/m  , BMI Body mass index is 34.02 kg/m. GEN: Well nourished, well developed, in no acute distress  HEENT: normal  Neck: no JVD, carotid bruits, or masses Cardiac: RRR; no murmurs, rubs, or gallops,no edema  Respiratory:  clear to auscultation bilaterally, normal work of breathing GI: soft, nontender, nondistended, + BS MS: no deformity or atrophy  Skin: warm  and dry, bruising on left shin Neuro:  Strength and sensation are intact Psych: euthymic mood, full affect   EKG:   The ekg ordered today demonstrates NSR, no ST changes   Recent Labs: 06/15/2020: ALT 26; BUN 20; Creatinine, Ser 1.03; Potassium 4.7; Sodium 140; TSH 3.550 06/16/2020: Hemoglobin 12.5; Platelets 300   Lipid Panel    Component Value Date/Time   CHOL 172 06/15/2020 1210   TRIG 131 06/15/2020 1210   HDL 50 06/15/2020 1210   CHOLHDL 3.4 06/15/2020 1210   CHOLHDL 3.9 08/26/2016 1035   VLDL 32 (H) 08/26/2016 1035   [place] 99 06/15/2020 1210   LDLDIRECT 73.4 01/07/2013 0939     Other studies Reviewed: Additional studies/ records that were reviewed today with results demonstrating: labs reviewed; LDL 99 in 9/21.   ASSESSMENT AND PLAN:  1.   Hypertensive heart disease: The current medical regimen is effective;  continue present plan and medications.  Controlled at home.   2.   Hyperlipidemia: Whole food, plant-based diet.  Avoid processed foods.   3.   Carotid artery disease: Moderate unilateral carotid disease in February 2021.  4.   Hypothyroid: TSH normal in 9/21 5.  Bruising, likely related to steroids and aspirin.  No internal bleeding.   Current medicines are reviewed at length with the patient today.  The patient concerns regarding her medicines were addressed.  The following changes have been made:  No  change  Labs/ tests ordered today include:  No orders of the defined types were placed in this encounter.   Recommend 150 minutes/week of aerobic exercise Low fat, low carb, high fiber diet recommended  Disposition:   FU in 9 months   Signed, Larae Grooms, MD  12/12/2020 1:45 PM    [facility] [place], [place], [place]  [address] Phone: [phone]; Fax: [phone]

## 2020-12-12 ENCOUNTER — Other Ambulatory Visit: Payer: Self-pay

## 2020-12-12 ENCOUNTER — Other Ambulatory Visit: Payer: Medicare Other

## 2020-12-12 ENCOUNTER — Ambulatory Visit: Payer: Medicare Other | Admitting: Interventional Cardiology

## 2020-12-12 ENCOUNTER — Encounter: Payer: Self-pay | Admitting: Interventional Cardiology

## 2020-12-12 VITALS — BP 120/50 | HR 63 | Ht 62.0 in | Wt 186.0 lb

## 2020-12-12 DIAGNOSIS — I119 Hypertensive heart disease without heart failure: Secondary | ICD-10-CM | POA: Diagnosis not present

## 2020-12-12 DIAGNOSIS — E785 Hyperlipidemia, unspecified: Secondary | ICD-10-CM | POA: Diagnosis not present

## 2020-12-12 DIAGNOSIS — E039 Hypothyroidism, unspecified: Secondary | ICD-10-CM | POA: Diagnosis not present

## 2020-12-12 DIAGNOSIS — I6523 Occlusion and stenosis of bilateral carotid arteries: Secondary | ICD-10-CM | POA: Diagnosis not present

## 2020-12-12 DIAGNOSIS — T148XXA Other injury of unspecified body region, initial encounter: Secondary | ICD-10-CM

## 2020-12-12 NOTE — Patient Instructions (Signed)
Medication Instructions:  Your physician recommends that you continue on your current medications as directed. Please refer to the Current Medication list given to you today.  *If you need a refill on your cardiac medications before your next appointment, please call your pharmacy*   Lab Work: none If you have labs (blood work) drawn today and your tests are completely normal, you will receive your results only by: Marland Kitchen MyChart Message (if you have MyChart) OR . A paper copy in the mail If you have any lab test that is abnormal or we need to change your treatment, we will call you to review the results.   Testing/Procedures: none   Follow-Up: At [facility], you and your health needs are our priority.  As part of our continuing mission to provide you with exceptional heart care, we have created designated Provider Care Teams.  These Care Teams include your primary Cardiologist (physician) and Advanced Practice Providers (APPs -  Physician Assistants and Nurse Practitioners) who all work together to provide you with the care you need, when you need it.  We recommend signing up for the patient portal called "MyChart".  Sign up information is provided on this After Visit Summary.  MyChart is used to connect with patients for Virtual Visits (Telemedicine).  Patients are able to view lab/test results, encounter notes, upcoming appointments, etc.  Non-urgent messages can be sent to your provider as well.   To learn more about what you can do with MyChart, go to [email or web address].    Your next appointment:   8 month(s)--November  The format for your next appointment:   In Person  Provider:   You may see Larae Grooms, MD or one of the following Advanced Practice Providers on your designated Care Team:    Melina Copa, PA-C  Ermalinda Barrios, PA-C    Other Instructions

## 2020-12-14 ENCOUNTER — Other Ambulatory Visit: Payer: Self-pay | Admitting: Interventional Cardiology

## 2020-12-18 ENCOUNTER — Encounter (HOSPITAL_COMMUNITY): Payer: Medicare Other

## 2020-12-29 ENCOUNTER — Ambulatory Visit: Payer: Self-pay

## 2020-12-29 ENCOUNTER — Other Ambulatory Visit (HOSPITAL_COMMUNITY): Payer: Self-pay

## 2021-01-01 ENCOUNTER — Ambulatory Visit (HOSPITAL_COMMUNITY)
Admission: RE | Admit: 2021-01-01 | Discharge: 2021-01-01 | Disposition: A | Discharge status: Home / Self Care | Payer: Medicare Other | Source: Ambulatory Visit | Attending: Internal Medicine | Admitting: Internal Medicine

## 2021-01-01 ENCOUNTER — Other Ambulatory Visit: Payer: Self-pay

## 2021-01-01 DIAGNOSIS — M810 Age-related osteoporosis without current pathological fracture: Secondary | ICD-10-CM | POA: Insufficient documentation

## 2021-01-01 MED ORDER — ZOLEDRONIC ACID 5 MG/100ML IV SOLN
INTRAVENOUS | Status: AC
  Filled 2021-01-01: qty 100

## 2021-01-01 MED ORDER — ZOLEDRONIC ACID 5 MG/100ML IV SOLN
5.0000 mg | Freq: Once | INTRAVENOUS | Status: AC
  Administered 2021-01-01: 5 mg via INTRAVENOUS

## 2021-01-01 NOTE — Discharge Instructions (Signed)
Zoledronic Acid Injection (Paget's Disease, Osteoporosis) What is this medicine? ZOLEDRONIC ACID (ZOE le dron ik AS id) slows calcium loss from bones. It treats Paget's disease and osteoporosis. It may be used in other people at risk for bone loss. This medicine may be used for other purposes; ask your health care provider or pharmacist if you have questions. COMMON BRAND NAME(S): Reclast, Zometa What should I tell my health care provider before I take this medicine? They need to know if you have any of these conditions:  bleeding disorder  cancer  dental disease  kidney disease  low levels of calcium in the blood  low red blood cell counts  lung or breathing disease (asthma)  receiving steroids like dexamethasone or prednisone  an unusual or allergic reaction to zoledronic acid, other medicines, foods, dyes, or preservatives  pregnant or trying to get pregnant  breast-feeding How should I use this medicine? This drug is injected into a vein. It is given by a health care provider in a hospital or clinic setting. A special MedGuide will be given to you before each treatment. Be sure to read this information carefully each time. Talk to your health care provider about the use of this drug in children. Special care may be needed. Overdosage: If you think you have taken too much of this medicine contact a poison control center or emergency room at once. NOTE: This medicine is only for you. Do not share this medicine with others. What if I miss a dose? Keep appointments for follow-up doses. It is important not to miss your dose. Call your health care provider if you are unable to keep an appointment. What may interact with this medicine?  certain antibiotics given by injection  NSAIDs, medicines for pain and inflammation, like ibuprofen or naproxen  some diuretics like bumetanide, furosemide  teriparatide This list may not describe all possible interactions. Give your health  care provider a list of all the medicines, herbs, non-prescription drugs, or dietary supplements you use. Also tell them if you smoke, drink alcohol, or use illegal drugs. Some items may interact with your medicine. What should I watch for while using this medicine? Visit your health care provider for regular checks on your progress. It may be some time before you see the benefit from this drug. Some people who take this drug have severe bone, joint, or muscle pain. This drug may also increase your risk for jaw problems or a broken thigh bone. Tell your health care provider right away if you have severe pain in your jaw, bones, joints, or muscles. Tell you health care provider if you have any pain that does not go away or that gets worse. You should make sure you get enough calcium and vitamin D while you are taking this drug. Discuss the foods you eat and the vitamins you take with your health care provider. You may need blood work done while you are taking this drug. Tell your dentist and dental surgeon that you are taking this drug. You should not have major dental surgery while on this drug. See your dentist to have a dental exam and fix any dental problems before starting this drug. Take good care of your teeth while on this drug. Make sure you see your dentist for regular follow-up appointments. What side effects may I notice from receiving this medicine? Side effects that you should report to your doctor or health care provider as soon as possible:  allergic reactions (skin rash, itching or   hives; swelling of the face, lips, or tongue)  bone pain  infection (fever, chills, cough, sore throat, pain or trouble passing urine)  jaw pain, especially after dental work  joint pain  kidney injury (trouble passing urine or change in the amount of urine)  low calcium levels (fast heartbeat; muscle cramps or pain; pain, tingling, or numbness in the hands or feet; seizures)  low red blood cell  counts (trouble breathing; feeling faint; lightheaded, falls; unusually weak or tired)  muscle pain  palpitations  redness, blistering, peeling, or loosening of the skin, including inside the mouth Side effects that usually do not require medical attention (report to your doctor or health care provider if they continue or are bothersome):  diarrhea  eye irritation, itching, or pain  fever  general ill feeling or flu-like symptoms  headache  increase in blood pressure  nausea  pain, redness, or irritation at site where injected  stomach pain  upset stomach This list may not describe all possible side effects. Call your doctor for medical advice about side effects. You may report side effects to FDA at 1-800-FDA-1088. Where should I keep my medicine? This drug is given in a hospital or clinic. It will not be stored at home. NOTE: This sheet is a summary. It may not cover all possible information. If you have questions about this medicine, talk to your doctor, pharmacist, or health care provider.  2021 Elsevier/Gold Standard (2019-06-28 11:46:18)   

## 2021-01-11 DIAGNOSIS — I6523 Occlusion and stenosis of bilateral carotid arteries: Secondary | ICD-10-CM | POA: Insufficient documentation

## 2021-01-11 DIAGNOSIS — M1610 Unilateral primary osteoarthritis, unspecified hip: Secondary | ICD-10-CM | POA: Insufficient documentation

## 2021-01-11 DIAGNOSIS — R32 Unspecified urinary incontinence: Secondary | ICD-10-CM | POA: Insufficient documentation

## 2021-01-11 DIAGNOSIS — I700 Atherosclerosis of aorta: Secondary | ICD-10-CM | POA: Insufficient documentation

## 2021-01-11 DIAGNOSIS — J309 Allergic rhinitis, unspecified: Secondary | ICD-10-CM | POA: Insufficient documentation

## 2021-01-11 DIAGNOSIS — N1831 Chronic kidney disease, stage 3a: Secondary | ICD-10-CM | POA: Insufficient documentation

## 2021-01-11 DIAGNOSIS — K52831 Collagenous colitis: Secondary | ICD-10-CM | POA: Insufficient documentation

## 2021-01-11 DIAGNOSIS — I129 Hypertensive chronic kidney disease with stage 1 through stage 4 chronic kidney disease, or unspecified chronic kidney disease: Secondary | ICD-10-CM | POA: Insufficient documentation

## 2021-01-11 DIAGNOSIS — E785 Hyperlipidemia, unspecified: Secondary | ICD-10-CM | POA: Insufficient documentation

## 2021-01-15 ENCOUNTER — Ambulatory Visit (INDEPENDENT_AMBULATORY_CARE_PROVIDER_SITE_OTHER): Payer: Medicare Other | Admitting: Obstetrics & Gynecology

## 2021-01-15 ENCOUNTER — Other Ambulatory Visit: Payer: Self-pay

## 2021-01-15 ENCOUNTER — Encounter (HOSPITAL_BASED_OUTPATIENT_CLINIC_OR_DEPARTMENT_OTHER): Payer: Self-pay | Admitting: Obstetrics & Gynecology

## 2021-01-15 VITALS — BP 138/59 | HR 76 | Ht 62.0 in | Wt 184.0 lb

## 2021-01-15 DIAGNOSIS — N3281 Overactive bladder: Secondary | ICD-10-CM

## 2021-01-15 DIAGNOSIS — Z90710 Acquired absence of both cervix and uterus: Secondary | ICD-10-CM | POA: Diagnosis not present

## 2021-01-15 DIAGNOSIS — N9089 Other specified noninflammatory disorders of vulva and perineum: Secondary | ICD-10-CM | POA: Diagnosis not present

## 2021-01-15 DIAGNOSIS — Z01419 Encounter for gynecological examination (general) (routine) without abnormal findings: Secondary | ICD-10-CM

## 2021-01-15 DIAGNOSIS — Z780 Asymptomatic menopausal state: Secondary | ICD-10-CM | POA: Diagnosis not present

## 2021-01-15 MED ORDER — MOMETASONE FUROATE 0.1 % EX CREA: 1.0000 "application " | TOPICAL_CREAM | Freq: Every day | CUTANEOUS | 1 refills | Status: DC

## 2021-01-15 MED ORDER — MIRABEGRON ER 50 MG PO TB24: 50.0000 mg | ORAL_TABLET | Freq: Every day | ORAL | 1 refills | Status: DC

## 2021-01-15 NOTE — Progress Notes (Signed)
80 y.o. G2P2 Widowed White or Caucasian female here for breast and pelvic exam.  Having a lot of issues with incontinence.  Is on ditropan XL.  This has really caused significant constipation and dry mouth.  The incontinence is primarily with urgency.  She leaks when she coughs but this isn't too bad.  Really pleased with improvement with ditropan, just needs something different (if possible) due to constipation changes and dry mouth.  Denies vaginal bleeding.  Had BMD 10/2020.  Started Reclast.  She will have this again in 2 years.  Pt had normal kidney function stable per pt and good enough for Reclast infusion.  Patient's last menstrual period was 09/24/1979.          Sexually active: No.  H/O STD:  no  Health Maintenance: PCP:  Dr. Cristie Hem.  Last wellness appt was 10/2020.  Did blood work at that appt:  Yes Vaccines are up to date:  Yes, pt is sure she's had both pneumonia vaccination Colonoscopy:  08/2019, collagenous colitis, Dr. Cristina Gong MMG:  03/2020 BMD:  10/2020 Last pap smear:  11/2018.   H/o abnormal pap smear:  no    reports that she quit smoking about 45 years ago. She has never used smokeless tobacco. She reports current alcohol use of about 1.0 standard drink of alcohol per week. She reports that she does not use drugs.  Past Medical History:  Diagnosis Date  . Arthritis   . Back pain    had cortisone injection 6/15, Dr Durward Fortes  . Carotid artery occlusion   . Carotid bruit   . Colitis, collagenous   . Diverticulosis   . GERD (gastroesophageal reflux disease)   . Hyperlipidemia   . Hypertension   . Injury of left leg 06/05/12   Pt. fell  . Peripheral vascular disease ([place])   . Shingles outbreak 2017  . Thyroid disease 02-27-12   lt. thyroid nodule, bx. done 5 yrs ago-now some enlargement is seen.    Past Surgical History:  Procedure Laterality Date  . ABDOMINAL HYSTERECTOMY  01/1980  . CAROTID ENDARTERECTOMY  1998   Stable since surgery -slight build up  returning-follows with yrly Dopplers  . CATARACT EXTRACTION, BILATERAL  2004 or 2005 per pt  . COLONOSCOPY    . EYE SURGERY  02-27-12   Bil. Cataract surgery  . THYROID LOBECTOMY  03/02/2012   Procedure: THYROID LOBECTOMY;  Surgeon: Earnstine Regal, MD;  Location: [facility];  Service: General;  Laterality: Left;    Current Outpatient Medications  Medication Sig Dispense Refill  . acetaminophen (TYLENOL) 500 MG tablet Take 1,000 mg by mouth 2 (two) times daily.    Marland Kitchen amLODipine (NORVASC) 2.5 MG tablet TAKE 1 TABLET BY MOUTH DAILY WITH BREAKFAST 90 tablet 3  . aspirin 81 MG EC tablet Take 81 mg by mouth daily.    . B Complex Vitamins (VITAMIN-B COMPLEX PO) Take 1 tablet by mouth daily with breakfast.    . Bacillus Coagulans-Inulin (ALIGN PREBIOTIC-PROBIOTIC PO) Take 1 capsule by mouth at bedtime.    . budesonide (ENTOCORT EC) 3 MG 24 hr capsule Take 3 mg by mouth every other day.    . Cholecalciferol (VITAMIN D) 50 MCG (2000 UT) tablet Take 4,000 Units by mouth daily.    . hydrochlorothiazide (HYDRODIURIL) 25 MG tablet TAKE 1 AND 1/2 TABLETS BY MOUTH DAILY 135 tablet 2  . KRILL OIL PO Take 500 mg by mouth daily. MEGA RED    . levothyroxine (SYNTHROID) 112  MCG tablet Take 1 tablet (112 mcg total) by mouth daily. 90 tablet 3  . melatonin 5 MG TABS Take 10 mg by mouth at bedtime.     . metoprolol succinate (TOPROL-XL) 100 MG 24 hr tablet TAKE 1 TABLET BY MOUTH DAILY WITH BREAKFAST OR IMMEDIATELY FOLLOWING A MEAL 90 tablet 3  . Multiple Vitamin (MULTIVITAMIN) tablet Take 1 tablet by mouth daily.      . mupirocin ointment (BACTROBAN) 2 % SMARTSIG:1 Application Topical 2-3 Times Daily    . oxybutynin (DITROPAN-XL) 10 MG 24 hr tablet Take 10 mg by mouth at bedtime.    . rosuvastatin (CRESTOR) 20 MG tablet TAKE 1 TABLET BY MOUTH DAILY 90 tablet 3  . telmisartan (MICARDIS) 80 MG tablet TAKE 1 TABLET BY MOUTH DAILY 90 tablet 3  . verapamil (CALAN-SR) 240 MG CR tablet TAKE 1 TABLET BY MOUTH DAILY WITH  BREAKFAST 90 tablet 3   No current facility-administered medications for this visit.    Family History  Problem Relation Age of Onset  . Heart disease Mother   . Hypertension Mother   . Arthritis Mother   . Stroke Mother   . Hyperlipidemia Mother   . Atrial fibrillation Mother   . Heart disease Brother        before age 3  . Heart attack Neg Hx     Review of Systems  Genitourinary: Positive for frequency.    Exam:   BP (!) 138/59   Pulse 76   Ht 5\' 2"  (1.575 m)   Wt 184 lb (83.5 kg)   LMP 09/24/1979   BMI 33.65 kg/m   Height: 5\' 2"  (157.5 cm)  General appearance: alert, cooperative and appears stated age Breasts: normal appearance, no masses or tenderness Abdomen: soft, non-tender; bowel sounds normal; no masses,  no organomegaly Lymph nodes: Cervical, supraclavicular, and axillary nodes normal.  No abnormal inguinal nodes palpated Neurologic: Grossly normal  Pelvic: External genitalia:  no lesions              Urethra:  normal appearing urethra with no masses, tenderness or lesions              Bartholins and Skenes: normal                 Vagina: normal appearing vagina with atrophic changes and no discharge, no lesions              Cervix: absent              Pap taken: No. Bimanual Exam:  Uterus:  uterus absent              Adnexa: no mass, fullness, tenderness               Rectovaginal: Confirms               Anus:  normal sphincter tone, no lesions  Chaperone, Prince Rome, CMA, was present for exam.  Assessment/Plan: 1. Encntr for gyn exam (general) (routine) w/o abn findings - Pap smear not indicated - Colonoscopy 12/202 - BMD 10/2020 - vaccines updated - lab work done with Dr. Jacalyn Lefevre   2. Postmenopausal - Off HRT  3. OAB (overactive bladder) - will stop current medication and switch to Myrbetriq.  Hypertension side effect discussed. Pt will monitor for signs/symptoms of hypertension.   - recheck 4 weeks - mirabegron ER (MYRBETRIQ) 50 MG TB24  tablet; Take 1 tablet (50 mg total) by mouth daily.  Dispense: 30  tablet; Refill: 1  4. H/O: hysterectomy - ovaries remain  5. Vulvar irritation - mometasone (ELOCON) 0.1 % cream; Apply 1 application topically daily.  Dispense: 45 g; Refill: 1

## 2021-01-18 ENCOUNTER — Ambulatory Visit (HOSPITAL_COMMUNITY)
Admission: RE | Admit: 2021-01-18 | Discharge: 2021-01-18 | Disposition: A | Discharge status: Home / Self Care | Payer: Medicare Other | Source: Ambulatory Visit | Attending: Vascular Surgery | Admitting: Vascular Surgery

## 2021-01-18 ENCOUNTER — Other Ambulatory Visit: Payer: Self-pay

## 2021-01-18 ENCOUNTER — Ambulatory Visit: Payer: Medicare Other | Admitting: Physician Assistant

## 2021-01-18 VITALS — BP 135/55 | HR 61 | Temp 97.8°F | Resp 20 | Ht 62.0 in | Wt 182.1 lb

## 2021-01-18 DIAGNOSIS — I6523 Occlusion and stenosis of bilateral carotid arteries: Secondary | ICD-10-CM

## 2021-01-18 NOTE — Progress Notes (Addendum)
History of Present Illness:  Patient is a 80 y.o. year old female who presents for evaluation of carotid stenosis.  She has been monitored yearly now for stable asymptomatic carotid stenosis. She has remote history of right CEA in 1998 by Dr. Amedeo Plenty. This was done also for asymptomatic high grade stenosis. She has no history of CVA or TIAs.  The patient denies symptoms of TIA, amaurosis, or stroke.   She is medically managed with Statin and ASA daily.  Past Medical History:  Diagnosis Date  . Arthritis   . Back pain    had cortisone injection 6/15, Dr Durward Fortes  . Carotid artery occlusion   . Carotid bruit   . Colitis, collagenous   . Diverticulosis   . GERD (gastroesophageal reflux disease)   . Hyperlipidemia   . Hypertension   . Injury of left leg 06/05/12   Pt. fell  . Peripheral vascular disease ([place])   . Shingles outbreak 2017  . Thyroid disease 02-27-12   lt. thyroid nodule, bx. done 5 yrs ago-now some enlargement is seen.    Past Surgical History:  Procedure Laterality Date  . ABDOMINAL HYSTERECTOMY  01/1980  . CAROTID ENDARTERECTOMY  1998   Stable since surgery -slight build up returning-follows with yrly Dopplers  . CATARACT EXTRACTION, BILATERAL  2004 or 2005 per pt  . COLONOSCOPY    . EYE SURGERY  02-27-12   Bil. Cataract surgery  . THYROID LOBECTOMY  03/02/2012   Procedure: THYROID LOBECTOMY;  Surgeon: Earnstine Regal, MD;  Location: [facility];  Service: General;  Laterality: Left;     Social History Social History   Tobacco Use  . Smoking status: Former Smoker    Quit date: 09/24/1975    Years since quitting: 45.3  . Smokeless tobacco: Never Used  Vaping Use  . Vaping Use: Never used  Substance Use Topics  . Alcohol use: Yes    Alcohol/week: 1.0 standard drink    Types: 1 Glasses of wine per week  . Drug use: No    Family History Family History  Problem Relation Age of Onset  . Heart disease Mother   . Hypertension Mother   . Arthritis Mother   .  Stroke Mother   . Hyperlipidemia Mother   . Atrial fibrillation Mother   . Heart disease Brother        before age 56  . Heart attack Neg Hx     Allergies  Allergies  Allergen Reactions  . Zebeta Other (See Comments)    Does not work, per patient.  . Ace Inhibitors Other (See Comments)    cough     Current Outpatient Medications  Medication Sig Dispense Refill  . acetaminophen (TYLENOL) 500 MG tablet Take 1,000 mg by mouth 2 (two) times daily.    Marland Kitchen amLODipine (NORVASC) 2.5 MG tablet TAKE 1 TABLET BY MOUTH DAILY WITH BREAKFAST 90 tablet 3  . aspirin 81 MG EC tablet Take 81 mg by mouth daily.    . B Complex Vitamins (VITAMIN-B COMPLEX PO) Take 1 tablet by mouth daily with breakfast.    . Bacillus Coagulans-Inulin (ALIGN PREBIOTIC-PROBIOTIC PO) Take 1 capsule by mouth at bedtime.    . budesonide (ENTOCORT EC) 3 MG 24 hr capsule Take 3 mg by mouth every other day.    . Cholecalciferol (VITAMIN D) 50 MCG (2000 UT) tablet Take 4,000 Units by mouth daily.    . hydrochlorothiazide (HYDRODIURIL) 25 MG tablet TAKE 1 AND 1/2  TABLETS BY MOUTH DAILY 135 tablet 2  . KRILL OIL PO Take 500 mg by mouth daily. MEGA RED    . levothyroxine (SYNTHROID) 112 MCG tablet Take 1 tablet (112 mcg total) by mouth daily. 90 tablet 3  . melatonin 5 MG TABS Take 10 mg by mouth at bedtime.     . metoprolol succinate (TOPROL-XL) 100 MG 24 hr tablet TAKE 1 TABLET BY MOUTH DAILY WITH BREAKFAST OR IMMEDIATELY FOLLOWING A MEAL 90 tablet 3  . mirabegron ER (MYRBETRIQ) 50 MG TB24 tablet Take 1 tablet (50 mg total) by mouth daily. 30 tablet 1  . mometasone (ELOCON) 0.1 % cream Apply 1 application topically daily. 45 g 1  . Multiple Vitamin (MULTIVITAMIN) tablet Take 1 tablet by mouth daily.      . mupirocin ointment (BACTROBAN) 2 % SMARTSIG:1 Application Topical 2-3 Times Daily    . rosuvastatin (CRESTOR) 20 MG tablet TAKE 1 TABLET BY MOUTH DAILY 90 tablet 3  . telmisartan (MICARDIS) 80 MG tablet TAKE 1 TABLET BY MOUTH  DAILY 90 tablet 3  . verapamil (CALAN-SR) 240 MG CR tablet TAKE 1 TABLET BY MOUTH DAILY WITH BREAKFAST 90 tablet 3   No current facility-administered medications for this visit.    ROS:   General:  No weight loss, Fever, chills  HEENT: No recent headaches, no nasal bleeding, no visual changes, no sore throat  Neurologic: No dizziness, blackouts, seizures. No recent symptoms of stroke or mini- stroke. No recent episodes of slurred speech, or temporary blindness.  Cardiac: No recent episodes of chest pain/pressure, no shortness of breath at rest.  No shortness of breath with exertion.  Positive atrial fibrillation or irregular heartbeat  Vascular: No history of rest pain in feet.  No history of claudication.  No history of non-healing ulcer, No history of DVT   Pulmonary: No home oxygen, no productive cough, no hemoptysis,  No asthma or wheezing  Musculoskeletal:  [ ]  Arthritis, [ ]  Low back pain,  [x ] Joint pain  Hematologic:No history of hypercoagulable state.  No history of easy bleeding.  No history of anemia  Gastrointestinal: No hematochezia or melena,  No gastroesophageal reflux, no trouble swallowing  Urinary: [ ]  chronic Kidney disease, [ ]  on HD - [ ]  MWF or [ ]  TTHS, [ ]  Burning with urination, [ ]  Frequent urination, [ ]  Difficulty urinating;   Skin: No rashes  Psychological: No history of anxiety,  No history of depression   Physical Examination  Vitals:   01/18/21 1412 01/18/21 1415  BP: (!) 160/67 (!) 135/55  Pulse: 61   Resp: 20   Temp: 97.8 F (36.6 C)   TempSrc: Temporal   SpO2: 98%   Weight: 182 lb 1.6 oz (82.6 kg)   Height: 5\' 2"  (1.575 m)     Body mass index is 33.31 kg/m.  General:  Alert and oriented, no acute distress HEENT: Normal Neck: No bruit or JVD Pulmonary: Clear to auscultation bilaterally Cardiac: RRR without murmer  Gastrointestinal: Soft, non-tender, non-distended, no mass, no scars Skin: No rash Musculoskeletal: No deformity  or edema  Neurologic: Upper and lower extremity motor 5/5 and symmetric  DATA:     Right Carotid Findings:  +----------+--------+--------+--------+-------------------------+--------+       PSV cm/sEDV cm/sStenosisPlaque Description    Comments  +----------+--------+--------+--------+-------------------------+--------+  CCA Prox 61   7                           +----------+--------+--------+--------+-------------------------+--------+  CCA Mid  46   12                          +----------+--------+--------+--------+-------------------------+--------+  CCA Distal61   15       heterogenous and calcific      +----------+--------+--------+--------+-------------------------+--------+  ICA Prox 382   68   60-79% calcific               +----------+--------+--------+--------+-------------------------+--------+  ICA Mid  97   22                          +----------+--------+--------+--------+-------------------------+--------+  ICA Distal93   21                          +----------+--------+--------+--------+-------------------------+--------+  ECA    39   0                           +----------+--------+--------+--------+-------------------------+--------+   +----------+--------+-------+----------------+-------------------+       PSV cm/sEDV cmsDescribe    Arm Pressure (mmHG)  +----------+--------+-------+----------------+-------------------+  [id number]   8   Multiphasic, WNL            +----------+--------+-------+----------------+-------------------+   +---------+--------+--+--------+--++  VertebralPSV cm/s62EDV cm/s12  +---------+--------+--+--------+--++      Left Carotid Findings:   +----------+--------+--------+--------+-------------------------+--------+       PSV cm/sEDV cm/sStenosisPlaque Description    Comments  +----------+--------+--------+--------+-------------------------+--------+  CCA Prox 98   18                          +----------+--------+--------+--------+-------------------------+--------+  CCA Mid  113   23       heterogenous             +----------+--------+--------+--------+-------------------------+--------+  CCA Distal125   20       heterogenous             +----------+--------+--------+--------+-------------------------+--------+  ICA Prox 315   44   40-59% heterogenous and calcific      +----------+--------+--------+--------+-------------------------+--------+  ICA Mid  103   22                          +----------+--------+--------+--------+-------------------------+--------+  ICA Distal126   25                          +----------+--------+--------+--------+-------------------------+--------+  ECA    281   26   >50%  heterogenous             +----------+--------+--------+--------+-------------------------+--------+   +----------+--------+--------+----------------+-------------------+       PSV cm/sEDV cm/sDescribe    Arm Pressure (mmHG)  +----------+--------+--------+----------------+-------------------+  Subclavian160   3    Multiphasic, WNL            +----------+--------+--------+----------------+-------------------+   +---------+--------+--+--------+--+---------+  VertebralPSV cm/s55EDV cm/s15Antegrade  +---------+--------+--+--------+--+---------+     Summary:  Right Carotid: Velocities in the right ICA are consistent with a 60-79%         stenosis. Calcific plaque may  obscure higher velocity.  Abnormal         ECA waveform, dampened, limited visualization.   Left Carotid: Velocities in the left ICA are consistent with a 40-59%  stenosis.        The ECA appears >50% stenosed.   Vertebrals: Bilateral vertebral arteries demonstrate antegrade flow.  Subclavians: Normal flow hemodynamics  were seen in bilateral subclavian        arteries.          NOTE: Further imaging modality may be warranted.     ASSESSMENT:  Asymptomatic carotid stenosis The carotid duplex is accentually unchanged from 6 months ago.   Right Carotid: Velocities in the right ICA are consistent with a 60-79%         stenosis. Calcific plaque may obscure higher velocity.  Abnormal         ECA waveform, dampened, limited visualization.   Left Carotid: Velocities in the left ICA are consistent with a 40-59%  stenosis.   PLAN: She will continue to stay active and take asa and statin daily.  I will schedule a repeat carotid duplex in 6 months.  If she has symptoms of stroke or TIA she will call 911.     Roxy Horseman PA-C [facility] Office: [phone]  MD in clinic [place]

## 2021-01-23 ENCOUNTER — Other Ambulatory Visit: Payer: Self-pay

## 2021-01-23 ENCOUNTER — Ambulatory Visit: Payer: Medicare Other | Admitting: Orthopaedic Surgery

## 2021-01-23 ENCOUNTER — Encounter: Payer: Self-pay | Admitting: Orthopaedic Surgery

## 2021-01-23 VITALS — Ht 62.0 in | Wt 182.0 lb

## 2021-01-23 DIAGNOSIS — M25551 Pain in right hip: Secondary | ICD-10-CM

## 2021-01-23 DIAGNOSIS — M1611 Unilateral primary osteoarthritis, right hip: Secondary | ICD-10-CM | POA: Diagnosis not present

## 2021-01-23 NOTE — Progress Notes (Signed)
Office Visit Note   Patient: [name]           Date of Birth: 1940/10/06           MRN: [id number] Visit Date: 01/23/2021              Requested by: Michael Boston, MD [address] [place],  [place] [address] PCP: Michael Boston, MD   Assessment & Plan: Visit Diagnoses:  1. Pain in right hip   2. Unilateral primary osteoarthritis, right hip     Plan: Mrs. Staff has primary osteoarthritis of her right hip.  Dr. Ernestina Patches performed an intra-articular cortisone injection last August and she relates that she has had at least 8 to 9 months of relief.  Within the past 3 to 4 weeks she has had some recurrent pain in her groin and the anterior thigh.  She has not used any ambulatory aid but she is always concerned about compromise of her activities as she does live by herself.  X-rays were performed last August demonstrating significant narrowing of the right hip joint.  As she did so well with the cortisone injection will repeat the injection with Dr. Ernestina Patches and have her return as needed.  We briefly discussed hip replacement as definitive procedure at some point in the future if needed  Follow-Up Instructions: Return if symptoms worsen or fail to improve.   Orders:  Orders Placed This Encounter  Procedures  . Ambulatory referral to Physical Medicine Rehab   No orders of the defined types were placed in this encounter.     Procedures: No procedures performed   Clinical Data: No additional findings.   Subjective: Chief Complaint  Patient presents with  . Right Hip - Pain  Patient presents today for right recurrent hip pain. She saw Dr.Newton in August of 2021 and received a cortisone injection. Patient states that the injection really helped, but started to hurt again two weeks ago. She said that walking on level ground she does okay. She takes over the counter medicine as needed. She wants to talk about her long term solution to the hip pain.  HPI  Review of  Systems   Objective: Vital Signs: Ht 5\' 2"  (1.575 m)   Wt 182 lb (82.6 kg)   LMP 09/24/1979   BMI 33.29 kg/m   Physical Exam Constitutional:      Appearance: She is well-developed.  Pulmonary:     Effort: Pulmonary effort is normal.  Skin:    General: Skin is warm and dry.  Neurological:     Mental Status: She is alert and oriented to person, place, and time.  Psychiatric:        Behavior: Behavior normal.     Ortho Exam Awake alert and oriented x3.  Comfortable sitting in no obvious distress.  No use of any ambulatory aid.  Has significant decreased motion of her right compared to left hip.  There is about 10 degrees of internal and external rotation with pain on extreme.  Did have a slight limp and some stiffness when first arising from a sitting position.  Neurologically intact straight leg raise negative.  No pain over the greater trochanter today Specialty Comments:  No specialty comments available.  Imaging: No results found.   PMFS History: Patient Active Problem List   Diagnosis Date Noted  . Allergic rhinitis 01/11/2021  . Chronic kidney disease due to hypertension 01/11/2021  . Chronic kidney disease, stage 3a ([place]) 01/11/2021  .  Collagenous colitis 01/11/2021  . Hardening of the aorta (main artery of the heart) ([place]) 01/11/2021  . Hyperlipidemia 01/11/2021  . Occlusion and stenosis of bilateral carotid arteries 01/11/2021  . Primary localized osteoarthritis of pelvic region and thigh 01/11/2021  . Urinary incontinence 01/11/2021  . Unilateral primary osteoarthritis, right hip 05/02/2020  . Low back pain 05/02/2020  . Diarrhea 07/26/2019  . Gaseous regurgitation- burping 07/26/2019  . Acute nonintractable headache 07/26/2019  . Other fatigue 07/26/2019  . Irritable bowel syndrome type symptoms at this time with diarrhea 07/26/2019  . Stress and adjustment reaction- loss of husband july 2020; settling estate now etc 07/26/2019  . Vertigo 03/24/2019  .  Glucose intolerance (impaired glucose tolerance) 02/17/2019  . Other specified hypothyroidism 02/17/2019  . Caregiver stress 02/17/2019  . Caregiver stress syndrome 07/27/2018  . Vitamin D deficiency 09/30/2016  . Obesity, Class I, BMI 30-34.9 09/30/2016  . Right carotid bruit 06/07/2016  . h/o Diverticulosis 06/07/2016  . GERD (gastroesophageal reflux disease) 06/07/2016  . Environmental and seasonal allergies 06/07/2016  . Arthritis 06/07/2016  . Moderate osteopenia 06/07/2016  . Bilateral carotid artery disease ([place]) 06/10/2012  . 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 Dyslipidemia- low HDL and inc VLDL & TG 04/04/2011  . Benign hypertensive heart disease without heart failure 04/04/2011   Past Medical History:  Diagnosis Date  . Arthritis   . Back pain    had cortisone injection 6/15, Dr Durward Fortes  . Carotid artery occlusion   . Carotid bruit   . Colitis, collagenous   . Diverticulosis   . GERD (gastroesophageal reflux disease)   . Hyperlipidemia   . Hypertension   . Injury of left leg 06/05/12   Pt. fell  . Peripheral vascular disease ([place])   . Shingles outbreak 2017  . Thyroid disease 02-27-12   lt. thyroid nodule, bx. done 5 yrs ago-now some enlargement is seen.    Family History  Problem Relation Age of Onset  . Heart disease Mother   . Hypertension Mother   . Arthritis Mother   . Stroke Mother   . Hyperlipidemia Mother   . Atrial fibrillation Mother   . Heart disease Brother        before age 19  . Heart attack Neg Hx     Past Surgical History:  Procedure Laterality Date  . ABDOMINAL HYSTERECTOMY  01/1980  . CAROTID ENDARTERECTOMY  1998   Stable since surgery -slight build up returning-follows with yrly Dopplers  . CATARACT EXTRACTION, BILATERAL  2004 or 2005 per pt  . COLONOSCOPY    . EYE SURGERY  02-27-12   Bil. Cataract surgery  . THYROID LOBECTOMY  03/02/2012   Procedure: THYROID LOBECTOMY;  Surgeon: Earnstine Regal, MD;  Location: [facility];  Service:  General;  Laterality: Left;   Social History   Occupational History  . Not on file  Tobacco Use  . Smoking status: Former Smoker    Quit date: 09/24/1975    Years since quitting: 45.3  . Smokeless tobacco: Never Used  Vaping Use  . Vaping Use: Never used  Substance and Sexual Activity  . Alcohol use: Yes    Alcohol/week: 1.0 standard drink    Types: 1 Glasses of wine per week  . Drug use: No  . Sexual activity: Not Currently    Partners: Male    Birth control/protection: Surgical    Comment: hysterectomy

## 2021-01-24 ENCOUNTER — Other Ambulatory Visit: Payer: Self-pay

## 2021-01-24 DIAGNOSIS — I6523 Occlusion and stenosis of bilateral carotid arteries: Secondary | ICD-10-CM

## 2021-01-25 ENCOUNTER — Telehealth: Payer: Self-pay | Admitting: Physical Medicine and Rehabilitation

## 2021-01-25 ENCOUNTER — Ambulatory Visit (HOSPITAL_BASED_OUTPATIENT_CLINIC_OR_DEPARTMENT_OTHER): Payer: Medicare Other | Admitting: Obstetrics & Gynecology

## 2021-01-25 NOTE — Telephone Encounter (Signed)
Called pt and sch 5/23 

## 2021-01-25 NOTE — Telephone Encounter (Signed)
Pt called and wondering if she can go ahead and get scheduled with Dr.Newton. CB [phone]

## 2021-02-06 ENCOUNTER — Other Ambulatory Visit: Payer: Self-pay | Admitting: Physical Medicine and Rehabilitation

## 2021-02-06 DIAGNOSIS — Z1231 Encounter for screening mammogram for malignant neoplasm of breast: Secondary | ICD-10-CM

## 2021-02-12 ENCOUNTER — Ambulatory Visit: Payer: Medicare Other | Admitting: Physical Medicine and Rehabilitation

## 2021-02-12 ENCOUNTER — Ambulatory Visit: Payer: Self-pay

## 2021-02-12 ENCOUNTER — Other Ambulatory Visit: Payer: Self-pay

## 2021-02-12 ENCOUNTER — Encounter: Payer: Self-pay | Admitting: Physical Medicine and Rehabilitation

## 2021-02-12 DIAGNOSIS — M25551 Pain in right hip: Secondary | ICD-10-CM

## 2021-02-12 MED ORDER — TRIAMCINOLONE ACETONIDE 40 MG/ML IJ SUSP
60.0000 mg | INTRAMUSCULAR | Status: AC | PRN
  Administered 2021-02-12: 60 mg via INTRA_ARTICULAR

## 2021-02-12 MED ORDER — BUPIVACAINE HCL 0.25 % IJ SOLN
4.0000 mL | INTRAMUSCULAR | Status: AC | PRN
  Administered 2021-02-12: 4 mL via INTRA_ARTICULAR

## 2021-02-12 NOTE — Progress Notes (Signed)
Pt state right hip pain. Pt state she feels has pain in her lower back. Pt state walking makes the pain worse. Pt state she take over the counter pain meds to help ease her pain.  Numeric Pain Rating Scale and Functional Assessment Average Pain 5   In the last MONTH (on 0-10 scale) has pain interfered with the following?  1. General activity like being  able to carry out your everyday physical activities such as walking, climbing stairs, carrying groceries, or moving a chair?  Rating(7)   -BT, -Dye Allergies.

## 2021-02-12 NOTE — Progress Notes (Signed)
   [name] - 80 y.o. female MRN [id number]  Date of birth: 01-26-1941  Office Visit Note: Visit Date: 02/12/2021 PCP: Michael Boston, MD Referred by: Michael Boston, MD  Subjective: Chief Complaint  Patient presents with  . Right Hip - Pain  . Lower Back - Pain   HPI:  [name] is a 79 y.o. female who comes in today for planned repeat Right anesthetic hip arthrogram with fluoroscopic guidance.  The patient has failed conservative care including home exercise, medications, time and activity modification. Prior injection gave more than 50% relief for several months. This injection will be diagnostic and hopefully therapeutic.  Please see requesting physician notes for further details and justification.  Referring: Dr. Joni Fears   ROS Otherwise per HPI.  Assessment & Plan: Visit Diagnoses:    ICD-10-CM   1. Pain in right hip  M25.551 XR C-ARM NO REPORT    Large Joint Inj: R hip joint    Plan: No additional findings.   Meds & Orders: No orders of the defined types were placed in this encounter.   Orders Placed This Encounter  Procedures  . Large Joint Inj: R hip joint  . XR C-ARM NO REPORT    Follow-up: Return for visit to requesting physician as needed.   Procedures: Large Joint Inj: R hip joint on 02/12/2021 9:06 AM Indications: diagnostic evaluation and pain Details: 22 G 3.5 in needle, fluoroscopy-guided anterior approach  Arthrogram: No  Medications: 4 mL bupivacaine 0.25 %; 60 mg triamcinolone acetonide 40 MG/ML Outcome: tolerated well, no immediate complications  There was excellent flow of contrast producing a partial arthrogram of the hip. The patient did have relief of symptoms during the anesthetic phase of the injection. Procedure, treatment alternatives, risks and benefits explained, specific risks discussed. Consent was given by the patient. Immediately prior to procedure a time out was called to verify the correct patient, procedure,  equipment, support staff and site/side marked as required. Patient was prepped and draped in the usual sterile fashion.          Clinical History: No specialty comments available.     Objective:  VS:  HT:    WT:   BMI:     BP:   HR: bpm  TEMP: ( )  RESP:  Physical Exam   Imaging: No results found.

## 2021-03-08 ENCOUNTER — Ambulatory Visit (HOSPITAL_BASED_OUTPATIENT_CLINIC_OR_DEPARTMENT_OTHER): Payer: Medicare Other | Admitting: Obstetrics & Gynecology

## 2021-03-08 ENCOUNTER — Encounter (HOSPITAL_BASED_OUTPATIENT_CLINIC_OR_DEPARTMENT_OTHER): Payer: Self-pay | Admitting: Obstetrics & Gynecology

## 2021-03-08 ENCOUNTER — Other Ambulatory Visit: Payer: Self-pay

## 2021-03-08 VITALS — BP 135/46 | HR 78 | Wt 187.0 lb

## 2021-03-08 DIAGNOSIS — N3281 Overactive bladder: Secondary | ICD-10-CM

## 2021-03-08 DIAGNOSIS — T887XXA Unspecified adverse effect of drug or medicament, initial encounter: Secondary | ICD-10-CM

## 2021-03-08 MED ORDER — GEMTESA 75 MG PO TABS: 1.0000 | ORAL_TABLET | Freq: Every day | ORAL | 2 refills | Status: DC

## 2021-03-08 NOTE — Progress Notes (Signed)
GYNECOLOGY  VISIT  CC:   medication and BP recheck  HPI: 80 y.o. G2P2 Widowed White or Caucasian female here for recheck after starting 50mg  myrbetriq.  She has been taking her blood pressure.  It is higher in the morning before she takes her blood pressure medication (which is around 10am).  She brought values with her.  These range from 139-161/58-64.  When she takes it after her medication, blood pressure is lower in the 130's/80's range.  She took her blood pressure mediation this morning.  She has noticed a fine facial rash that started when she started the myrbetriq.  This is bothersome and she is needing to placed cortisone on it every day.  She failed oxybutynin due to severe dry mouth.  Aware this may not get covered or prior authorization may be needed.  Also, discussed urogyn referral as well.  Reports recent change in renal function.  Medication dosage adjustment reviewed.  None needed.  Overall, bladder symptoms are much improved.  GYNECOLOGIC HISTORY: Patient's last menstrual period was 09/24/1979.  Patient Active Problem List   Diagnosis Date Noted   Allergic rhinitis 01/11/2021   Chronic kidney disease due to hypertension 01/11/2021   Chronic kidney disease, stage 3a ([place]) 01/11/2021   Collagenous colitis 01/11/2021   Hardening of the aorta (main artery of the heart) ([place]) 01/11/2021   Hyperlipidemia 01/11/2021   Occlusion and stenosis of bilateral carotid arteries 01/11/2021   Primary localized osteoarthritis of pelvic region and thigh 01/11/2021   Urinary incontinence 01/11/2021   Unilateral primary osteoarthritis, right hip 05/02/2020   Low back pain 05/02/2020   Diarrhea 07/26/2019   Gaseous regurgitation- burping 07/26/2019   Acute nonintractable headache 07/26/2019   Other fatigue 07/26/2019   Irritable bowel syndrome type symptoms at this time with diarrhea 07/26/2019   Stress and adjustment reaction- loss of husband july 2020; settling estate now etc 07/26/2019    Vertigo 03/24/2019   Glucose intolerance (impaired glucose tolerance) 02/17/2019   Other specified hypothyroidism 02/17/2019   Caregiver stress 02/17/2019   Caregiver stress syndrome 07/27/2018   Vitamin D deficiency 09/30/2016   Obesity, Class I, BMI 30-34.9 09/30/2016   Right carotid bruit 06/07/2016   h/o Diverticulosis 06/07/2016   GERD (gastroesophageal reflux disease) 06/07/2016   Environmental and seasonal allergies 06/07/2016   Arthritis 06/07/2016   Moderate osteopenia 06/07/2016   Bilateral carotid artery disease ([place]) 06/10/2012   Hypothyroidism, postsurgical 05/05/2012   Dyslipidemia- low HDL and inc VLDL & TG 04/04/2011   Benign hypertensive heart disease without heart failure 04/04/2011    Past Medical History:  Diagnosis Date   Arthritis    Back pain    had cortisone injection 6/15, Dr Durward Fortes   Carotid artery occlusion    Carotid bruit    Colitis, collagenous    Diverticulosis    GERD (gastroesophageal reflux disease)    Hyperlipidemia    Hypertension    Injury of left leg 06/05/12   Pt. fell   Peripheral vascular disease ([place])    Shingles outbreak 2017   Thyroid disease 02-27-12   lt. thyroid nodule, bx. done 5 yrs ago-now some enlargement is seen.    Past Surgical History:  Procedure Laterality Date   ABDOMINAL HYSTERECTOMY  01/1980   CAROTID ENDARTERECTOMY  1998   Stable since surgery -slight build up returning-follows with yrly Dopplers   CATARACT EXTRACTION, BILATERAL  2004 or 2005 per pt   COLONOSCOPY     EYE SURGERY  02-27-12   Bil. Cataract  surgery   THYROID LOBECTOMY  03/02/2012   Procedure: THYROID LOBECTOMY;  Surgeon: Earnstine Regal, MD;  Location: [facility];  Service: General;  Laterality: Left;    MEDS:   Current Outpatient Medications on File Prior to Visit  Medication Sig Dispense Refill   acetaminophen (TYLENOL) 500 MG tablet Take 1,000 mg by mouth 2 (two) times daily.     amLODipine (NORVASC) 2.5 MG tablet TAKE 1 TABLET BY MOUTH DAILY  WITH BREAKFAST 90 tablet 3   aspirin 81 MG EC tablet Take 81 mg by mouth daily.     B Complex Vitamins (VITAMIN-B COMPLEX PO) Take 1 tablet by mouth daily with breakfast.     Bacillus Coagulans-Inulin (ALIGN PREBIOTIC-PROBIOTIC PO) Take 1 capsule by mouth at bedtime.     budesonide (ENTOCORT EC) 3 MG 24 hr capsule Take 3 mg by mouth every other day.     Cholecalciferol (VITAMIN D) 50 MCG (2000 UT) tablet Take 4,000 Units by mouth daily.     hydrochlorothiazide (HYDRODIURIL) 25 MG tablet TAKE 1 AND 1/2 TABLETS BY MOUTH DAILY 135 tablet 2   KRILL OIL PO Take 500 mg by mouth daily. MEGA RED     levothyroxine (SYNTHROID) 112 MCG tablet Take 1 tablet (112 mcg total) by mouth daily. 90 tablet 3   melatonin 5 MG TABS Take 10 mg by mouth at bedtime.      metoprolol succinate (TOPROL-XL) 100 MG 24 hr tablet TAKE 1 TABLET BY MOUTH DAILY WITH BREAKFAST OR IMMEDIATELY FOLLOWING A MEAL 90 tablet 3   mirabegron ER (MYRBETRIQ) 50 MG TB24 tablet Take 1 tablet (50 mg total) by mouth daily. 30 tablet 1   mometasone (ELOCON) 0.1 % cream Apply 1 application topically daily. 45 g 1   Multiple Vitamin (MULTIVITAMIN) tablet Take 1 tablet by mouth daily.       mupirocin ointment (BACTROBAN) 2 % SMARTSIG:1 Application Topical 2-3 Times Daily     rosuvastatin (CRESTOR) 20 MG tablet TAKE 1 TABLET BY MOUTH DAILY 90 tablet 3   telmisartan (MICARDIS) 80 MG tablet TAKE 1 TABLET BY MOUTH DAILY 90 tablet 3   verapamil (CALAN-SR) 240 MG CR tablet TAKE 1 TABLET BY MOUTH DAILY WITH BREAKFAST 90 tablet 3   No current facility-administered medications on file prior to visit.    ALLERGIES: Zebeta and Ace inhibitors  Family History  Problem Relation Age of Onset   Heart disease Mother    Hypertension Mother    Arthritis Mother    Stroke Mother    Hyperlipidemia Mother    Atrial fibrillation Mother    Heart disease Brother        before age 51   Heart attack Neg Hx     SH:  widowed, non smoker  Review of Systems   Constitutional: Negative.   Genitourinary:  Positive for urgency.   PHYSICAL EXAMINATION:    BP (!) 135/46   Pulse 78   Wt 187 lb (84.8 kg)   LMP 09/24/1979   BMI 34.20 kg/m     General appearance: alert, cooperative and appears stated age No other physical exam performed   Assessment/Plan: 1. OAB (overactive bladder) - Vibegron (GEMTESA) 75 MG TABS; Take 1 tablet by mouth daily.  Dispense: 30 tablet; Refill: 2  2. Medication side effects

## 2021-03-15 ENCOUNTER — Ambulatory Visit (INDEPENDENT_AMBULATORY_CARE_PROVIDER_SITE_OTHER): Payer: Medicare Other | Admitting: Orthopaedic Surgery

## 2021-03-15 ENCOUNTER — Other Ambulatory Visit: Payer: Self-pay

## 2021-03-15 ENCOUNTER — Encounter: Payer: Self-pay | Admitting: Orthopaedic Surgery

## 2021-03-15 VITALS — Ht 62.0 in | Wt 187.0 lb

## 2021-03-15 DIAGNOSIS — G8929 Other chronic pain: Secondary | ICD-10-CM

## 2021-03-15 DIAGNOSIS — M5441 Lumbago with sciatica, right side: Secondary | ICD-10-CM | POA: Diagnosis not present

## 2021-03-15 NOTE — Progress Notes (Signed)
Office Visit Note   Patient: [name]           Date of Birth: 09-03-1941           MRN: [id number] Visit Date: 03/15/2021              Requested by: Michael Boston, MD [address] [place],  [place] [address] PCP: Michael Boston, MD   Assessment & Plan: Visit Diagnoses:  1. Chronic right-sided low back pain with right-sided sciatica     Plan: [name] has been followed recently for evaluation of right hip pain.  Dr. Ernestina Patches has injected the hip with an excellent response.  Within the last several weeks she has developed a problem with her low back and finds it the more she is on her feet the more trouble she has with back buttock and bilateral thigh pain.  She does not have any numbness or tingling.  She is able to distinguish between her back pain and her hip pain.  In 2015 she had a epidural steroid injection that made a huge difference with the pain she was experiencing at the time.  I cannot find an old MRI scan but I suspect she had 1 it may be out of the system.  Films of the lumbar spine were performed last year demonstrating diffuse degenerative changes with degenerative disc disease at L5-S1.  I am not sure if the pain she is experiencing in her buttock and her leg is referred pain or if she truly has stenosis.  I like Dr. Ernestina Patches to consider an epidural steroid injection and then consider an MRI scan if no improvement  Follow-Up Instructions: Return if symptoms worsen or fail to improve.   Orders:  Orders Placed This Encounter  Procedures   Ambulatory referral to Physical Medicine Rehab   No orders of the defined types were placed in this encounter.     Procedures: No procedures performed   Clinical Data: No additional findings.   Subjective: Chief Complaint  Patient presents with   Lower Back - Pain  Patient presents today for lower back pain. She said that it really started to bother her about a month ago. She said that she can only walk short distances.  She has pain that travels down the back of both legs. No numbness or tingling. She gets relief with sitting or lying down. She takes tylenol as needed.  She is able to distinguish between the pain in her right hip which is minimal at this point.  Dr. Ernestina Patches recently injected her hip.  Her present discomfort does interfere with her activities [place] being with her children  HPI  Review of Systems   Objective: Vital Signs: Ht 5\' 2"  (1.575 m)   Wt 187 lb (84.8 kg)   LMP 09/24/1979   BMI 34.20 kg/m   Physical Exam Constitutional:      Appearance: She is well-developed.  Pulmonary:     Effort: Pulmonary effort is normal.  Skin:    General: Skin is warm and dry.  Neurological:     Mental Status: She is alert and oriented to person, place, and time.  Psychiatric:        Behavior: Behavior normal.    Ortho Exam awake alert and oriented x3.  Comfortable sitting.  Straight leg raise negative.  Excellent strength of both lower extremities could motor is intact.  Good capillary refill to toes.  No pain over either greater trochanter  or with motion of either hip.  No percussible tenderness of lumbar spine or at the lumbosacral junction  Specialty Comments:  No specialty comments available.  Imaging: No results found.   PMFS History: Patient Active Problem List   Diagnosis Date Noted   Allergic rhinitis 01/11/2021   Chronic kidney disease due to hypertension 01/11/2021   Chronic kidney disease, stage 3a ([place]) 01/11/2021   Collagenous colitis 01/11/2021   Hardening of the aorta (main artery of the heart) ([place]) 01/11/2021   Hyperlipidemia 01/11/2021   Occlusion and stenosis of bilateral carotid arteries 01/11/2021   Primary localized osteoarthritis of pelvic region and thigh 01/11/2021   Urinary incontinence 01/11/2021   Unilateral primary osteoarthritis, right hip 05/02/2020   Low back pain 05/02/2020   Diarrhea 07/26/2019   Gaseous regurgitation- burping  07/26/2019   Acute nonintractable headache 07/26/2019   Other fatigue 07/26/2019   Irritable bowel syndrome type symptoms at this time with diarrhea 07/26/2019   Stress and adjustment reaction- loss of husband july 2020; settling estate now etc 07/26/2019   Vertigo 03/24/2019   Glucose intolerance (impaired glucose tolerance) 02/17/2019   Other specified hypothyroidism 02/17/2019   Caregiver stress 02/17/2019   Caregiver stress syndrome 07/27/2018   Vitamin D deficiency 09/30/2016   Obesity, Class I, BMI 30-34.9 09/30/2016   Right carotid bruit 06/07/2016   h/o Diverticulosis 06/07/2016   GERD (gastroesophageal reflux disease) 06/07/2016   Environmental and seasonal allergies 06/07/2016   Arthritis 06/07/2016   Moderate osteopenia 06/07/2016   Bilateral carotid artery disease ([place]) 06/10/2012   Hypothyroidism, postsurgical 05/05/2012   Dyslipidemia- low HDL and inc VLDL & TG 04/04/2011   Benign hypertensive heart disease without heart failure 04/04/2011   Past Medical History:  Diagnosis Date   Arthritis    Back pain    had cortisone injection 6/15, Dr Durward Fortes   Carotid artery occlusion    Carotid bruit    Colitis, collagenous    Diverticulosis    GERD (gastroesophageal reflux disease)    Hyperlipidemia    Hypertension    Injury of left leg 06/05/12   Pt. fell   Peripheral vascular disease ([place])    Shingles outbreak 2017   Thyroid disease 02-27-12   lt. thyroid nodule, bx. done 5 yrs ago-now some enlargement is seen.    Family History  Problem Relation Age of Onset   Heart disease Mother    Hypertension Mother    Arthritis Mother    Stroke Mother    Hyperlipidemia Mother    Atrial fibrillation Mother    Heart disease Brother        before age 85   Heart attack Neg Hx     Past Surgical History:  Procedure Laterality Date   ABDOMINAL HYSTERECTOMY  01/1980   CAROTID ENDARTERECTOMY  1998   Stable since surgery -slight build up returning-follows with yrly Dopplers    CATARACT EXTRACTION, BILATERAL  2004 or 2005 per pt   COLONOSCOPY     EYE SURGERY  02-27-12   Bil. Cataract surgery   THYROID LOBECTOMY  03/02/2012   Procedure: THYROID LOBECTOMY;  Surgeon: Earnstine Regal, MD;  Location: [facility];  Service: General;  Laterality: Left;   Social History   Occupational History   Not on file  Tobacco Use   Smoking status: Former    Pack years: 0.00    Types: Cigarettes    Quit date: 09/24/1975    Years since quitting: 45.5   Smokeless tobacco: Never  Vaping Use  Vaping Use: Never used  Substance and Sexual Activity   Alcohol use: Yes    Alcohol/week: 1.0 standard drink    Types: 1 Glasses of wine per week   Drug use: No   Sexual activity: Not Currently    Partners: Male    Birth control/protection: Surgical    Comment: hysterectomy

## 2021-04-04 ENCOUNTER — Ambulatory Visit
Admission: RE | Admit: 2021-04-04 | Discharge: 2021-04-04 | Disposition: A | Discharge status: Home / Self Care | Payer: Medicare Other | Source: Ambulatory Visit | Attending: Physical Medicine and Rehabilitation | Admitting: Physical Medicine and Rehabilitation

## 2021-04-04 ENCOUNTER — Other Ambulatory Visit: Payer: Self-pay

## 2021-04-04 DIAGNOSIS — Z1231 Encounter for screening mammogram for malignant neoplasm of breast: Secondary | ICD-10-CM

## 2021-04-05 ENCOUNTER — Ambulatory Visit: Payer: Medicare Other | Admitting: Physical Medicine and Rehabilitation

## 2021-04-05 DIAGNOSIS — M5416 Radiculopathy, lumbar region: Secondary | ICD-10-CM

## 2021-04-06 ENCOUNTER — Other Ambulatory Visit (HOSPITAL_BASED_OUTPATIENT_CLINIC_OR_DEPARTMENT_OTHER): Payer: Self-pay | Admitting: Obstetrics & Gynecology

## 2021-04-06 DIAGNOSIS — N3281 Overactive bladder: Secondary | ICD-10-CM

## 2021-04-06 NOTE — Progress Notes (Signed)
Patient was unable to secure a driver for today's procedure so her procedure was rescheduled.

## 2021-04-06 NOTE — Telephone Encounter (Signed)
Refill declined. Pt states that she is currently using [place]

## 2021-04-12 ENCOUNTER — Encounter: Payer: Self-pay | Admitting: Physical Medicine and Rehabilitation

## 2021-04-12 ENCOUNTER — Other Ambulatory Visit: Payer: Self-pay

## 2021-04-12 ENCOUNTER — Ambulatory Visit: Payer: Self-pay

## 2021-04-12 ENCOUNTER — Ambulatory Visit (INDEPENDENT_AMBULATORY_CARE_PROVIDER_SITE_OTHER): Payer: Medicare Other | Admitting: Physical Medicine and Rehabilitation

## 2021-04-12 VITALS — BP 123/74 | HR 72

## 2021-04-12 DIAGNOSIS — M5416 Radiculopathy, lumbar region: Secondary | ICD-10-CM | POA: Diagnosis not present

## 2021-04-12 MED ORDER — BETAMETHASONE SOD PHOS & ACET 6 (3-3) MG/ML IJ SUSP
12.0000 mg | Freq: Once | INTRAMUSCULAR | Status: AC
Start: 2021-04-12 — End: 2021-04-12
  Administered 2021-04-12: 12 mg

## 2021-04-12 NOTE — Progress Notes (Signed)
Pt state middle back pain that travels to her lower back. Pt state walking makes the pain worse. Pt state she takes over the counter pain meds and stop to rest to help ease her pain.  Numeric Pain Rating Scale and Functional Assessment Average Pain 5   In the last MONTH (on 0-10 scale) has pain interfered with the following?  1. General activity like being  able to carry out your everyday physical activities such as walking, climbing stairs, carrying groceries, or moving a chair?  Rating(8)   +Driver, -BT, -Dye Allergies.

## 2021-04-12 NOTE — Patient Instructions (Signed)

## 2021-04-13 NOTE — Progress Notes (Signed)
[name] - 80 y.o. female MRN [id number]  Date of birth: [birth year]  Office Visit Note: Visit Date: 04/12/2021 PCP: Michael Boston, MD Referred by: Michael Boston, MD  Subjective: Chief Complaint  Patient presents with   Middle Back - Pain   Lower Back - Pain   HPI:  [name] is a 80 y.o. female who comes in today at the request of Dr. Joni Fears for planned Right L5-S1 Lumbar Interlaminar epidural steroid injection with fluoroscopic guidance.  The patient has failed conservative care including home exercise, medications, time and activity modification.  This injection will be diagnostic and hopefully therapeutic.  Please see requesting physician notes for further details and justification.  Patient did not get much relief at all with diagnostic hip injection.  She has symptoms consistent with neurogenic claudication.  We are going to complete a diagnostic L5-S1 interlaminar injection.  Depending on relief would look at MRI of the lumbar spine along with Dr. Joni Fears.   ROS Otherwise per HPI.  Assessment & Plan: Visit Diagnoses:    ICD-10-CM   1. Lumbar radiculopathy  M54.16 XR C-ARM NO REPORT    Epidural Steroid injection    betamethasone acetate-betamethasone sodium phosphate (CELESTONE) injection 12 mg      Plan: No additional findings.   Meds & Orders:  Meds ordered this encounter  Medications   betamethasone acetate-betamethasone sodium phosphate (CELESTONE) injection 12 mg    Orders Placed This Encounter  Procedures   XR C-ARM NO REPORT   Epidural Steroid injection    Follow-up: No follow-ups on file.   Procedures: No procedures performed  Lumbar Epidural Steroid Injection - Interlaminar Approach with Fluoroscopic Guidance  Patient: [name]      Date of Birth: Jun 19, 1941 MRN: [id number] PCP: Michael Boston, MD      Visit Date: 04/12/2021   Universal Protocol:     Consent Given By: the patient  Position: PRONE  Additional  Comments: Vital signs were monitored before and after the procedure. Patient was prepped and draped in the usual sterile fashion. The correct patient, procedure, and site was verified.   Injection Procedure Details:   Procedure diagnoses: Lumbar radiculopathy [M54.16]   Meds Administered:  Meds ordered this encounter  Medications   betamethasone acetate-betamethasone sodium phosphate (CELESTONE) injection 12 mg     Laterality: Left  Location/Site:  L5-S1  Needle: 3.5 in., 20 ga. Tuohy  Needle Placement: Paramedian epidural  Findings:   -Comments: Excellent flow of contrast into the epidural space.  Procedure Details: Using a paramedian approach from the side mentioned above, the region overlying the inferior lamina was localized under fluoroscopic visualization and the soft tissues overlying this structure were infiltrated with 4 ml. of 1% Lidocaine without Epinephrine. The Tuohy needle was inserted into the epidural space using a paramedian approach.   The epidural space was localized using loss of resistance along with counter oblique bi-planar fluoroscopic views.  After negative aspirate for air, blood, and CSF, a 2 ml. volume of Isovue-250 was injected into the epidural space and the flow of contrast was observed. Radiographs were obtained for documentation purposes.    The injectate was administered into the level noted above.   Additional Comments:  The patient tolerated the procedure well Dressing: 2 x 2 sterile gauze and Band-Aid    Post-procedure details: Patient was observed during the procedure. Post-procedure instructions were reviewed.  Patient left the clinic in stable condition.   Clinical History: No  specialty comments available.     Objective:  VS:  HT:    WT:   BMI:     BP:123/74  HR:72bpm  TEMP: ( )  RESP:  Physical Exam Vitals and nursing note reviewed.  Constitutional:      General: She is not in acute distress.    Appearance: Normal  appearance. She is obese. She is not ill-appearing.  HENT:     Head: Normocephalic and atraumatic.     Right Ear: External ear normal.     Left Ear: External ear normal.  Eyes:     Extraocular Movements: Extraocular movements intact.  Cardiovascular:     Rate and Rhythm: Normal rate.     Pulses: Normal pulses.  Pulmonary:     Effort: Pulmonary effort is normal. No respiratory distress.  Abdominal:     General: There is no distension.     Palpations: Abdomen is soft.  Musculoskeletal:        General: Tenderness present.     Cervical back: Neck supple.     Right lower leg: No edema.     Left lower leg: No edema.     Comments: Patient has good distal strength with no pain over the greater trochanters.  No clonus or focal weakness.  Skin:    Findings: No erythema, lesion or rash.  Neurological:     General: No focal deficit present.     Mental Status: She is alert and oriented to person, place, and time.     Sensory: No sensory deficit.     Motor: No weakness or abnormal muscle tone.     Coordination: Coordination normal.  Psychiatric:        Mood and Affect: Mood normal.        Behavior: Behavior normal.     Imaging: XR C-ARM NO REPORT  Result Date: 04/12/2021 Please see Notes tab for imaging impression.

## 2021-04-13 NOTE — Procedures (Signed)
Lumbar Epidural Steroid Injection - Interlaminar Approach with Fluoroscopic Guidance  Patient: [name]      Date of Birth: October 08, 1940 MRN: [id number] PCP: Michael Boston, MD      Visit Date: 04/12/2021   Universal Protocol:     Consent Given By: the patient  Position: PRONE  Additional Comments: Vital signs were monitored before and after the procedure. Patient was prepped and draped in the usual sterile fashion. The correct patient, procedure, and site was verified.   Injection Procedure Details:   Procedure diagnoses: Lumbar radiculopathy [M54.16]   Meds Administered:  Meds ordered this encounter  Medications   betamethasone acetate-betamethasone sodium phosphate (CELESTONE) injection 12 mg     Laterality: Left  Location/Site:  L5-S1  Needle: 3.5 in., 20 ga. Tuohy  Needle Placement: Paramedian epidural  Findings:   -Comments: Excellent flow of contrast into the epidural space.  Procedure Details: Using a paramedian approach from the side mentioned above, the region overlying the inferior lamina was localized under fluoroscopic visualization and the soft tissues overlying this structure were infiltrated with 4 ml. of 1% Lidocaine without Epinephrine. The Tuohy needle was inserted into the epidural space using a paramedian approach.   The epidural space was localized using loss of resistance along with counter oblique bi-planar fluoroscopic views.  After negative aspirate for air, blood, and CSF, a 2 ml. volume of Isovue-250 was injected into the epidural space and the flow of contrast was observed. Radiographs were obtained for documentation purposes.    The injectate was administered into the level noted above.   Additional Comments:  The patient tolerated the procedure well Dressing: 2 x 2 sterile gauze and Band-Aid    Post-procedure details: Patient was observed during the procedure. Post-procedure instructions were reviewed.  Patient left the clinic  in stable condition.

## 2021-04-17 ENCOUNTER — Telehealth: Payer: Self-pay | Admitting: Interventional Cardiology

## 2021-04-17 NOTE — Telephone Encounter (Signed)
Pt has noticed increased swelling in feet and ankles since decreasing HCTZ to '25mg'$  QD about a month ago.  PCP had her decrease it to '25mg'$  from 37.'5mg'$  due to dizziness and elevated BUN.  Pt states the edema isn't terrible, but enough that her feet and hands are tight so she's uncomfortable.  Mentions this is not unusual for her in hot weather and always controlled it well with an occasional Furosemide '40mg'$ .  Admits to maybe having a little extra salt recently and is cutting that out.  She was hoping Dr. Irish Lack would be willing to send in 30 tablets of Furosemide for her to use as needed for swelling.  Mentioned this was previously prescribed in 2018 for 90 pills and she still  has over 39 left but they don't work now due to being so old.  Will route to Dr. Irish Lack for review.

## 2021-04-17 NOTE — Telephone Encounter (Signed)
*  STAT* If patient is at the pharmacy, call can be transferred to refill team.   1. Which medications need to be refilled? (please list name of each medication and dose if known) furosemide (LASIX) 40 MG tablet   2. Which pharmacy/location (including street and city if local pharmacy) is medication to be sent to? [facility], [address]  3. Do they need a 30 day or 90 day supply? 90 ds      Previous rx was discontinued, however pt states that she is experiencing swelling in the tops of her feet and fingers and would like to begin taking this rx again

## 2021-04-17 NOTE — Telephone Encounter (Signed)
OK to call in Furosemide 40 mg daily prn Disp 30 tabs

## 2021-04-18 MED ORDER — FUROSEMIDE 40 MG PO TABS: 40.0000 mg | ORAL_TABLET | Freq: Every day | ORAL | 0 refills | Status: DC | PRN

## 2021-04-18 NOTE — Telephone Encounter (Signed)
Called patient back with Dr. Hassell Done recommendation. Patient verbalized understanding.

## 2021-06-04 ENCOUNTER — Telehealth: Payer: Self-pay | Admitting: Orthopaedic Surgery

## 2021-06-04 DIAGNOSIS — G8929 Other chronic pain: Secondary | ICD-10-CM

## 2021-06-04 NOTE — Telephone Encounter (Signed)
Patient was last seen by you in the office 03/15/2021. You referred to Dr. Ernestina Patches to consider ESI and if no relief, were going to consider MRI. Patient had ESI on 04/12/2021.  Would you like to try and proceed with MRI or have patient come back in for return visit? Please advise.

## 2021-06-04 NOTE — Telephone Encounter (Signed)
Pt called requesting a call back. Pt states she had a back injection and it did not help and wanting a call back for steps. Please call pt back at [phone].

## 2021-06-05 NOTE — Telephone Encounter (Signed)
MRI L-S spine-thanks

## 2021-06-05 NOTE — Telephone Encounter (Signed)
Order entered. I called patient and advised. She will call the office once MRI is scheduled to schedule follow up appt with Dr. Durward Fortes.

## 2021-06-15 ENCOUNTER — Other Ambulatory Visit (HOSPITAL_BASED_OUTPATIENT_CLINIC_OR_DEPARTMENT_OTHER): Payer: Self-pay | Admitting: Obstetrics & Gynecology

## 2021-06-15 DIAGNOSIS — N3281 Overactive bladder: Secondary | ICD-10-CM

## 2021-06-21 ENCOUNTER — Ambulatory Visit
Admission: RE | Admit: 2021-06-21 | Discharge: 2021-06-21 | Disposition: A | Discharge status: Home / Self Care | Payer: Medicare Other | Source: Ambulatory Visit | Attending: Orthopaedic Surgery | Admitting: Orthopaedic Surgery

## 2021-06-21 ENCOUNTER — Other Ambulatory Visit: Payer: Self-pay

## 2021-06-21 DIAGNOSIS — M5441 Lumbago with sciatica, right side: Secondary | ICD-10-CM

## 2021-06-21 DIAGNOSIS — G8929 Other chronic pain: Secondary | ICD-10-CM

## 2021-06-26 ENCOUNTER — Ambulatory Visit: Payer: Medicare Other | Admitting: Orthopaedic Surgery

## 2021-06-26 ENCOUNTER — Other Ambulatory Visit: Payer: Self-pay

## 2021-06-26 ENCOUNTER — Encounter: Payer: Self-pay | Admitting: Orthopaedic Surgery

## 2021-06-26 DIAGNOSIS — G8929 Other chronic pain: Secondary | ICD-10-CM | POA: Diagnosis not present

## 2021-06-26 DIAGNOSIS — M5441 Lumbago with sciatica, right side: Secondary | ICD-10-CM

## 2021-06-26 NOTE — Progress Notes (Signed)
Office Visit Note   Patient: [name]           Date of Birth: [birth year]           MRN: [id number] Visit Date: 06/26/2021              Requested by: Michael Boston, MD [address] [place],  [place] [address] PCP: Michael Boston, MD   Assessment & Plan: Visit Diagnoses:  1. Chronic right-sided low back pain with right-sided sciatica     Plan: [name] had an MRI scan of her lumbar spine demonstrating a grade 1 anterolisthesis of L4 and L5.  There was severe facet arthrosis with right lateral recess stenosis at L4-5 that could contribute to right L5 radiculopathy.  L3-4 left lateral recess stenosis could contribute to left L4 radiculopathy.  There was L5-S1 mild right neuroforaminal stenosis.  More pain on the right than the left lower extremity.  Long discussion regarding the above.  She had an epidural steroid injection last year was not sure that it made much of a difference in.  She also has arthritis of her right hip and has had a cortisone injection that seemed to help.  We will try a course of physical therapy and check her back again in a month.  Follow-Up Instructions: Return in about 1 month (around 07/27/2021).   Orders:  Orders Placed This Encounter  Procedures   Ambulatory referral to Physical Therapy   No orders of the defined types were placed in this encounter.     Procedures: No procedures performed   Clinical Data: No additional findings.   Subjective: Chief Complaint  Patient presents with   Lower Back - Follow-up    MRI review  Patient presents today for follow up on her lower back. She had an MRI and is here today for those results.  HPI  Review of Systems   Objective: Vital Signs: LMP 09/24/1979   Physical Exam Constitutional:      Appearance: She is well-developed.  Pulmonary:     Effort: Pulmonary effort is normal.  Skin:    General: Skin is warm and dry.  Neurological:     Mental Status: She is alert and oriented to person,  place, and time.  Psychiatric:        Behavior: Behavior normal.    Ortho Exam walks without ambulatory aid.  Straight leg raise negative there is some decreased motion of her right compared to left hip consistent with the previously diagnosed arthritis.  Motor exam appears to be intact.  No percussible tenderness of lumbar spine  Specialty Comments:  No specialty comments available.  Imaging: No results found.   PMFS History: Patient Active Problem List   Diagnosis Date Noted   Allergic rhinitis 01/11/2021   Chronic kidney disease due to hypertension 01/11/2021   Chronic kidney disease, stage 3a ([place]) 01/11/2021   Collagenous colitis 01/11/2021   Hardening of the aorta (main artery of the heart) ([place]) 01/11/2021   Hyperlipidemia 01/11/2021   Occlusion and stenosis of bilateral carotid arteries 01/11/2021   Primary localized osteoarthritis of pelvic region and thigh 01/11/2021   Urinary incontinence 01/11/2021   Unilateral primary osteoarthritis, right hip 05/02/2020   Low back pain 05/02/2020   Diarrhea 07/26/2019   Gaseous regurgitation- burping 07/26/2019   Acute nonintractable headache 07/26/2019   Other fatigue 07/26/2019   Irritable bowel syndrome type symptoms at this time with diarrhea 07/26/2019   Stress and adjustment reaction- loss of  husband july 2020; settling estate now etc 07/26/2019   Vertigo 03/24/2019   Glucose intolerance (impaired glucose tolerance) 02/17/2019   Other specified hypothyroidism 02/17/2019   Caregiver stress 02/17/2019   Caregiver stress syndrome 07/27/2018   Vitamin D deficiency 09/30/2016   Obesity, Class I, BMI 30-34.9 09/30/2016   Right carotid bruit 06/07/2016   h/o Diverticulosis 06/07/2016   GERD (gastroesophageal reflux disease) 06/07/2016   Environmental and seasonal allergies 06/07/2016   Arthritis 06/07/2016   Moderate osteopenia 06/07/2016   Bilateral carotid artery disease ([place]) 06/10/2012   Hypothyroidism, postsurgical  05/05/2012   Dyslipidemia- low HDL and inc VLDL & TG 04/04/2011   Benign hypertensive heart disease without heart failure 04/04/2011   Past Medical History:  Diagnosis Date   Arthritis    Back pain    had cortisone injection 6/15, Dr Durward Fortes   Carotid artery occlusion    Carotid bruit    Colitis, collagenous    Diverticulosis    GERD (gastroesophageal reflux disease)    Hyperlipidemia    Hypertension    Injury of left leg 06/05/12   Pt. fell   Peripheral vascular disease ([place])    Shingles outbreak 2017   Thyroid disease 02-27-12   lt. thyroid nodule, bx. done 5 yrs ago-now some enlargement is seen.    Family History  Problem Relation Age of Onset   Heart disease Mother    Hypertension Mother    Arthritis Mother    Stroke Mother    Hyperlipidemia Mother    Atrial fibrillation Mother    Heart disease Brother        before age 24   Heart attack Neg Hx     Past Surgical History:  Procedure Laterality Date   ABDOMINAL HYSTERECTOMY  01/1980   CAROTID ENDARTERECTOMY  1998   Stable since surgery -slight build up returning-follows with yrly Dopplers   CATARACT EXTRACTION, BILATERAL  2004 or 2005 per pt   COLONOSCOPY     EYE SURGERY  02-27-12   Bil. Cataract surgery   THYROID LOBECTOMY  03/02/2012   Procedure: THYROID LOBECTOMY;  Surgeon: Earnstine Regal, MD;  Location: [facility];  Service: General;  Laterality: Left;   Social History   Occupational History   Not on file  Tobacco Use   Smoking status: Former    Types: Cigarettes    Quit date: 09/24/1975    Years since quitting: 45.7   Smokeless tobacco: Never  Vaping Use   Vaping Use: Never used  Substance and Sexual Activity   Alcohol use: Yes    Alcohol/week: 1.0 standard drink    Types: 1 Glasses of wine per week   Drug use: No   Sexual activity: Not Currently    Partners: Male    Birth control/protection: Surgical    Comment: hysterectomy

## 2021-07-10 ENCOUNTER — Encounter: Payer: Medicare Other | Admitting: Physical Therapy

## 2021-07-10 ENCOUNTER — Ambulatory Visit: Payer: Medicare Other | Admitting: Physical Therapy

## 2021-07-10 ENCOUNTER — Other Ambulatory Visit: Payer: Self-pay

## 2021-07-10 ENCOUNTER — Encounter: Payer: Self-pay | Admitting: Physical Therapy

## 2021-07-10 DIAGNOSIS — M6281 Muscle weakness (generalized): Secondary | ICD-10-CM

## 2021-07-10 DIAGNOSIS — G8929 Other chronic pain: Secondary | ICD-10-CM

## 2021-07-10 DIAGNOSIS — R262 Difficulty in walking, not elsewhere classified: Secondary | ICD-10-CM | POA: Diagnosis not present

## 2021-07-10 DIAGNOSIS — M5441 Lumbago with sciatica, right side: Secondary | ICD-10-CM

## 2021-07-10 NOTE — Patient Instructions (Signed)
Access Code: [email or web address] URL: [email or web address] Date: 07/10/2021 Prepared by: Kearney Hard  Exercises Supine Bridge - 2 x daily - 7 x weekly - 2 sets - 10 reps - 5 seconds hold Supine Lower Trunk Rotation - 2 x daily - 7 x weekly - 5 reps - 20 seconds hold Hooklying Single Knee to Chest Stretch - 2 x daily - 7 x weekly - 5 reps - 20 seconds hold Supine Figure 4 Piriformis Stretch - 2 x daily - 7 x weekly - 5 reps - 20 seconds hold Supine Active Straight Leg Raise - 2 x daily - 7 x weekly - 2 sets - 10 reps

## 2021-07-10 NOTE — Therapy (Signed)
[facility] [address] [place], [place], [address] Phone: [phone]   Fax:  [phone]  Physical Therapy Evaluation  Patient Details  Name: [name] MRN: [id number] Date of Birth: Sep 29, 1940 Referring Provider (PT): Joni Fears MD   Encounter Date: 07/10/2021   PT End of Session - 07/10/21 1206     Visit Number 1    Number of Visits 12    Date for PT Re-Evaluation 08/24/21    PT Start Time 5366    PT Stop Time 1225    PT Time Calculation (min) 40 min    Activity Tolerance Patient tolerated treatment well    Behavior During Therapy [facility] for tasks assessed/performed             Past Medical History:  Diagnosis Date   Arthritis    Back pain    had cortisone injection 6/15, Dr Durward Fortes   Carotid artery occlusion    Carotid bruit    Colitis, collagenous    Diverticulosis    GERD (gastroesophageal reflux disease)    Hyperlipidemia    Hypertension    Injury of left leg 06/05/12   Pt. fell   Peripheral vascular disease ([place])    Shingles outbreak 2017   Thyroid disease 02-27-12   lt. thyroid nodule, bx. done 5 yrs ago-now some enlargement is seen.    Past Surgical History:  Procedure Laterality Date   ABDOMINAL HYSTERECTOMY  01/1980   CAROTID ENDARTERECTOMY  1998   Stable since surgery -slight build up returning-follows with yrly Dopplers   CATARACT EXTRACTION, BILATERAL  2004 or 2005 per pt   COLONOSCOPY     EYE SURGERY  02-27-12   Bil. Cataract surgery   THYROID LOBECTOMY  03/02/2012   Procedure: THYROID LOBECTOMY;  Surgeon: Earnstine Regal, MD;  Location: [facility];  Service: General;  Laterality: Left;    There were no vitals filed for this visit.    Subjective Assessment - 07/10/21 1153     Subjective Pt arriving reporting about 1.5 year history of low back pain and right hip pain. Pt stating her left hip hurts at times. Pt stating her rigth hip has been injected twice. Pt also reporting mild discomfort in her  shoulders. Pt states she loves walking but is unable to do so right now.    Pertinent History arthritis, back pain, GERD, HTN, PVD, throid disease, thyroid disease, carotid artery occlusion, hyperlipidemia,    Limitations Walking;House hold activities;Standing    Diagnostic tests MRI: grade 1 anterolisthesis of L4-5, severe facet arthrosis with right lateral recess stenosis L4-5, L3-4 lateral recess stenosis.    Patient Stated Goals Get back to walking 1 mile.    Currently in Pain? Yes    Pain Score 3     Pain Location Back    Pain Orientation Lower;Right    Pain Descriptors / Indicators Tightness;Sore;Other (Comment)    Pain Type Chronic pain    Pain Radiating Towards into bilateral hips, right > left    Pain Onset More than a month ago    Pain Frequency Intermittent    Aggravating Factors  running her sweeper, standing prolonged    Pain Relieving Factors tylenol, massage                [facility] PT Assessment - 07/10/21 0001       Assessment   Medical Diagnosis M54.41 chronic right sided low back pain with right sided sciatica    Referring Provider (PT) Joni Fears  MD    Hand Dominance Right    Prior Therapy no      Precautions   Precautions None      Restrictions   Weight Bearing Restrictions No      Balance Screen   Has the patient fallen in the past 6 months No    Is the patient reluctant to leave their home because of a fear of falling?  No      Home Ecologist residence      Prior Function   Level of Independence Independent    Vocation Retired    Leisure walking      Cognition   Overall Cognitive Status Within Functional Limits for tasks assessed      Observation/Other Assessments   Focus on Therapeutic Outcomes (FOTO)  51% (predicted 60%)      Posture/Postural Control   Posture/Postural Control Postural limitations    Postural Limitations Forward head;Rounded Shoulders;Decreased lumbar lordosis      ROM / Strength    AROM / PROM / Strength AROM;Strength      AROM   AROM Assessment Site Lumbar    Lumbar Flexion 40    Lumbar Extension 12    Lumbar - Right Side Bend 20    Lumbar - Left Side Bend 16    Lumbar - Right Rotation 50% functional    Lumbar - Left Rotation 50% functional      Strength   Overall Strength Deficits    Strength Assessment Site Hip;Knee    Right/Left Hip Right;Left    Right Hip Flexion 4/5    Right Hip ABduction 4/5    Right Hip ADduction 4/5    Left Hip Flexion 4/5    Left Hip ABduction 4/5    Left Hip ADduction 4/5    Right/Left Knee Right;Left    Right Knee Flexion 5/5    Right Knee Extension 5/5    Left Knee Flexion 5/5    Left Knee Extension 5/5      Palpation   Palpation comment TTP along SI joint line and right sided lumbar paraspinals      Special Tests   Other special tests Negative slump test on right and negative SLR on right      Transfers   Five time sit to stand comments  19 seconds with UE support      Ambulation/Gait   Gait Pattern Within Functional Limits                        Objective measurements completed on examination: See above findings.       [facility] Adult PT Treatment/Exercise - 07/10/21 0001       Exercises   Exercises Lumbar      Lumbar Exercises: Stretches   Single Knee to Chest Stretch 2 reps;10 seconds    Lower Trunk Rotation 2 reps;10 seconds    Figure 4 Stretch 20 seconds;2 reps      Lumbar Exercises: Supine   Bridge 5 reps;3 seconds                     PT Education - 07/10/21 1205     Education Details PT POC, HEP    Person(s) Educated Patient    Methods Explanation;Demonstration;Tactile cues;Verbal cues;Handout    Comprehension Returned demonstration;Verbalized understanding              PT Short Term Goals - 07/10/21 1209  PT SHORT TERM GOAL #1   Title Pt will be independent in her initial HEP.    Time 3    Period Weeks    Status New    Target Date 08/03/21       PT SHORT TERM GOAL #2   Title Pt will improve 5 time sit to stand to </= 14 seconds with no UE support.    Time 3    Period Weeks    Status New    Target Date 08/03/21               PT Long Term Goals - 07/10/21 1638       PT LONG TERM GOAL #1   Title Pt will be independent in her advanced HEP.    Time 6    Period Weeks    Status New    Target Date 08/24/21      PT LONG TERM GOAL #2   Title Pt will increase her bilateral hip strength to 5/5 in order to improve funtional mobility.    Time 6    Period Weeks    Status New    Target Date 08/24/21      PT LONG TERM GOAL #3   Title Pt will be able to report pain of </= 2/10 with household chores.    Time 6    Period Weeks    Status New    Target Date 08/24/21      PT LONG TERM GOAL #4   Title Pt will be able to amb 1/2 mile with pain </= 2/10.    Time 6    Period Weeks    Status New    Target Date 08/24/21      PT LONG TERM GOAL #5   Title Pt will improve her FOTO to >/= 60 %    Baseline 51% on 07/10/2021    Time 6    Period Weeks    Status New    Target Date 08/24/21                    Plan - 07/10/21 1619     Clinical Impression Statement Pt arriving to therapy reporting varying degrees of pain in her low back. Pt dx with anterolisthesis of L4-5, facel arthrosis, stenosis of L3-4. Pt with pin point pain over bilateral SI joints and right sided lumbar paraspinals with trigger points noted. Pt with weakness noted in bilateral hips and pt responed well to flexion stretching. Pt was issued a HEP. Skilled PT needed to address pt's impairments with the below interventions.    Personal Factors and Comorbidities Comorbidity 3+    Comorbidities MRI: grade 1 anterolisthesis of L4-5, severe facet arthrosis with right lateral recess stenosis L4-5, L3-4 lateral recess stenosis.arthritis, back pain, GERD, HTN, PVD, throid disease, thyroid disease, carotid artery occlusion, hyperlipidemia,     Examination-Participation Restrictions Other;Community Activity    Stability/Clinical Decision Making Stable/Uncomplicated    Clinical Decision Making Low    Rehab Potential Good    PT Frequency 2x / week    PT Duration 6 weeks    PT Treatment/Interventions ADLs/Self Care Home Management;Electrical Stimulation;Cryotherapy;Iontophoresis 4mg /ml Dexamethasone;Moist Heat;Traction;Ultrasound;Gait training;Stair training;Functional mobility training;Therapeutic activities;Therapeutic exercise;Balance training;Neuromuscular re-education;Cognitive remediation;Patient/family education;Manual techniques;Dry needling;Taping;Passive range of motion;Spinal Manipulations;Joint Manipulations    PT Next Visit Plan lumbar stretching, core strengthening, lumbar mobs, hip strengthening, manual therapy as needed, DN if appropriate    PT Home Exercise Plan Access Code: [email or web address]  URL: [email or web address]  Date: 07/10/2021  Prepared by: Kearney Hard    Exercises  Supine Bridge - 2 x daily - 7 x weekly - 2 sets - 10 reps - 5 seconds hold  Supine Lower Trunk Rotation - 2 x daily - 7 x weekly - 5 reps - 20 seconds hold  Hooklying Single Knee to Chest Stretch - 2 x daily - 7 x weekly - 5 reps - 20 seconds hold  Supine Figure 4 Piriformis Stretch - 2 x daily - 7 x weekly - 5 reps - 20 seconds hold  Supine Active Straight Leg Raise - 2 x daily - 7 x weekly - 2 sets - 10 reps    Consulted and Agree with Plan of Care Patient             Patient will benefit from skilled therapeutic intervention in order to improve the following deficits and impairments:  Pain, Postural dysfunction, Decreased strength, Decreased balance, Impaired flexibility, Decreased activity tolerance, Difficulty walking, Decreased range of motion, Decreased mobility  Visit Diagnosis: Chronic right-sided low back pain with right-sided sciatica  Muscle weakness (generalized)  Difficulty in walking, not elsewhere  classified     Problem List Patient Active Problem List   Diagnosis Date Noted   Allergic rhinitis 01/11/2021   Chronic kidney disease due to hypertension 01/11/2021   Chronic kidney disease, stage 3a ([place]) 01/11/2021   Collagenous colitis 01/11/2021   Hardening of the aorta (main artery of the heart) ([place]) 01/11/2021   Hyperlipidemia 01/11/2021   Occlusion and stenosis of bilateral carotid arteries 01/11/2021   Primary localized osteoarthritis of pelvic region and thigh 01/11/2021   Urinary incontinence 01/11/2021   Unilateral primary osteoarthritis, right hip 05/02/2020   Low back pain 05/02/2020   Diarrhea 07/26/2019   Gaseous regurgitation- burping 07/26/2019   Acute nonintractable headache 07/26/2019   Other fatigue 07/26/2019   Irritable bowel syndrome type symptoms at this time with diarrhea 07/26/2019   Stress and adjustment reaction- loss of husband july 2020; settling estate now etc 07/26/2019   Vertigo 03/24/2019   Glucose intolerance (impaired glucose tolerance) 02/17/2019   Other specified hypothyroidism 02/17/2019   Caregiver stress 02/17/2019   Caregiver stress syndrome 07/27/2018   Vitamin D deficiency 09/30/2016   Obesity, Class I, BMI 30-34.9 09/30/2016   Right carotid bruit 06/07/2016   h/o Diverticulosis 06/07/2016   GERD (gastroesophageal reflux disease) 06/07/2016   Environmental and seasonal allergies 06/07/2016   Arthritis 06/07/2016   Moderate osteopenia 06/07/2016   Bilateral carotid artery disease ([place]) 06/10/2012   Hypothyroidism, postsurgical 05/05/2012   Dyslipidemia- low HDL and inc VLDL & TG 04/04/2011   Benign hypertensive heart disease without heart failure 04/04/2011    Oretha Caprice, PT, MPT 07/10/2021, 4:44 PM  [facility] [address] [place], [place], [address] Phone: [phone]   Fax:  [phone]  Name: [name] MRN: [id number] Date of Birth: [birth year]

## 2021-07-12 ENCOUNTER — Other Ambulatory Visit: Payer: Self-pay

## 2021-07-12 ENCOUNTER — Ambulatory Visit: Payer: Medicare Other | Admitting: Rehabilitative and Restorative Service Providers"

## 2021-07-12 DIAGNOSIS — R262 Difficulty in walking, not elsewhere classified: Secondary | ICD-10-CM

## 2021-07-12 DIAGNOSIS — M6281 Muscle weakness (generalized): Secondary | ICD-10-CM

## 2021-07-12 DIAGNOSIS — G8929 Other chronic pain: Secondary | ICD-10-CM | POA: Diagnosis not present

## 2021-07-12 DIAGNOSIS — M5441 Lumbago with sciatica, right side: Secondary | ICD-10-CM | POA: Diagnosis not present

## 2021-07-12 NOTE — Therapy (Signed)
[facility] Physical Therapy [address] [place], [place], [address] Phone: [phone]   Fax:  [phone]  Physical Therapy Treatment  Patient Details  Name: [name] MRN: [id number] Date of Birth: 09-Feb-1941 Referring Provider (PT): Joni Fears MD   Encounter Date: 07/12/2021   PT End of Session - 07/12/21 1406     Visit Number 2    Number of Visits 12    Date for PT Re-Evaluation 08/24/21    Authorization Type UHC medicare $30    Progress Note Due on Visit 10    PT Start Time 1347    PT Stop Time 1428    PT Time Calculation (min) 41 min    Activity Tolerance Patient tolerated treatment well    Behavior During Therapy [facility] for tasks assessed/performed             Past Medical History:  Diagnosis Date   Arthritis    Back pain    had cortisone injection 6/15, Dr Durward Fortes   Carotid artery occlusion    Carotid bruit    Colitis, collagenous    Diverticulosis    GERD (gastroesophageal reflux disease)    Hyperlipidemia    Hypertension    Injury of left leg 06/05/12   Pt. fell   Peripheral vascular disease ([place])    Shingles outbreak 2017   Thyroid disease 02-27-12   lt. thyroid nodule, bx. done 5 yrs ago-now some enlargement is seen.    Past Surgical History:  Procedure Laterality Date   ABDOMINAL HYSTERECTOMY  01/1980   CAROTID ENDARTERECTOMY  1998   Stable since surgery -slight build up returning-follows with yrly Dopplers   CATARACT EXTRACTION, BILATERAL  2004 or 2005 per pt   COLONOSCOPY     EYE SURGERY  02-27-12   Bil. Cataract surgery   THYROID LOBECTOMY  03/02/2012   Procedure: THYROID LOBECTOMY;  Surgeon: Earnstine Regal, MD;  Location: [facility];  Service: General;  Laterality: Left;    There were no vitals filed for this visit.   Subjective Assessment - 07/12/21 1402     Subjective Pt. indicated no specific pain upon arrival today but did indicate having stiffness in back.  Pt. also reported Rt hip was having some pain c  overpressure stretch.    Pertinent History arthritis, back pain, GERD, HTN, PVD, throid disease, thyroid disease, carotid artery occlusion, hyperlipidemia,    Limitations Walking;House hold activities;Standing    Diagnostic tests MRI: grade 1 anterolisthesis of L4-5, severe facet arthrosis with right lateral recess stenosis L4-5, L3-4 lateral recess stenosis.    Patient Stated Goals Get back to walking 1 mile.    Currently in Pain? No/denies    Pain Score 0-No pain    Pain Location Back    Pain Orientation Lower;Right    Pain Descriptors / Indicators Tightness    Pain Type Chronic pain    Pain Onset More than a month ago    Pain Frequency Intermittent    Aggravating Factors  overpressure on Rt hip in stretching    Pain Relieving Factors rest                               [facility] Adult PT Treatment/Exercise - 07/12/21 0001       Exercises   Exercises Other Exercises    Other Exercises  Verbal review of existing exercise c trial performance      Lumbar Exercises: Stretches  Single Knee to Chest Stretch 10 seconds;2 reps;Left;Right    Lower Trunk Rotation 10 seconds    Figure 4 Stretch 20 seconds;2 reps   bilateral. (cues to avoid worsening of symptoms)     Lumbar Exercises: Aerobic   Nustep Lvl 5 6 mins      Lumbar Exercises: Standing   Row 20 reps;Both    Theraband Level (Row) Level 3 (Green)      Lumbar Exercises: Seated   Sit to Stand 10 reps   18 inch table s UE assist, fast up, down slow   Other Seated Lumbar Exercises scapular retraction x 10      Lumbar Exercises: Supine   Bridge 3 seconds;10 reps    Straight Leg Raise 10 reps   bilateral                    PT Education - 07/12/21 1405     Education Details HEP review.    Person(s) Educated Patient    Methods Explanation;Demonstration;Verbal cues;Handout    Comprehension Returned demonstration;Verbalized understanding              PT Short Term Goals - 07/12/21 1424        PT SHORT TERM GOAL #1   Title Pt will be independent in her initial HEP.    Time 3    Period Weeks    Status On-going    Target Date 08/03/21      PT SHORT TERM GOAL #2   Title Pt will improve 5 time sit to stand to </= 14 seconds with no UE support.    Time 3    Period Weeks    Status On-going    Target Date 08/03/21               PT Long Term Goals - 07/10/21 1638       PT LONG TERM GOAL #1   Title Pt will be independent in her advanced HEP.    Time 6    Period Weeks    Status New    Target Date 08/24/21      PT LONG TERM GOAL #2   Title Pt will increase her bilateral hip strength to 5/5 in order to improve funtional mobility.    Time 6    Period Weeks    Status New    Target Date 08/24/21      PT LONG TERM GOAL #3   Title Pt will be able to report pain of </= 2/10 with household chores.    Time 6    Period Weeks    Status New    Target Date 08/24/21      PT LONG TERM GOAL #4   Title Pt will be able to amb 1/2 mile with pain </= 2/10.    Time 6    Period Weeks    Status New    Target Date 08/24/21      PT LONG TERM GOAL #5   Title Pt will improve her FOTO to >/= 60 %    Baseline 51% on 07/10/2021    Time 6    Period Weeks    Status New    Target Date 08/24/21                   Plan - 07/12/21 1409     Clinical Impression Statement Reviewed HEP c overall good knowledge.  Advised tightness only in stretching, avoiding pain increase during performance.  Personal Factors and Comorbidities Comorbidity 3+    Comorbidities MRI: grade 1 anterolisthesis of L4-5, severe facet arthrosis with right lateral recess stenosis L4-5, L3-4 lateral recess stenosis.arthritis, back pain, GERD, HTN, PVD, throid disease, thyroid disease, carotid artery occlusion, hyperlipidemia,    Examination-Participation Restrictions Other;Community Activity    Stability/Clinical Decision Making Stable/Uncomplicated    Rehab Potential Good    PT Frequency 2x / week     PT Duration 6 weeks    PT Treatment/Interventions ADLs/Self Care Home Management;Electrical Stimulation;Cryotherapy;Iontophoresis 4mg /ml Dexamethasone;Moist Heat;Traction;Ultrasound;Gait training;Stair training;Functional mobility training;Therapeutic activities;Therapeutic exercise;Balance training;Neuromuscular re-education;Cognitive remediation;Patient/family education;Manual techniques;Dry needling;Taping;Passive range of motion;Spinal Manipulations;Joint Manipulations    PT Next Visit Plan Continue lumbar/hip mobility gains, hip strengthening/core strengthening    PT Home Exercise Plan Access Code: [email or web address]  URL: [email or web address]  Date: 07/10/2021  Prepared by: Kearney Hard    Exercises  Supine Bridge - 2 x daily - 7 x weekly - 2 sets - 10 reps - 5 seconds hold  Supine Lower Trunk Rotation - 2 x daily - 7 x weekly - 5 reps - 20 seconds hold  Hooklying Single Knee to Chest Stretch - 2 x daily - 7 x weekly - 5 reps - 20 seconds hold  Supine Figure 4 Piriformis Stretch - 2 x daily - 7 x weekly - 5 reps - 20 seconds hold  Supine Active Straight Leg Raise - 2 x daily - 7 x weekly - 2 sets - 10 reps    Consulted and Agree with Plan of Care Patient             Patient will benefit from skilled therapeutic intervention in order to improve the following deficits and impairments:  Pain, Postural dysfunction, Decreased strength, Decreased balance, Impaired flexibility, Decreased activity tolerance, Difficulty walking, Decreased range of motion, Decreased mobility  Visit Diagnosis: Chronic right-sided low back pain with right-sided sciatica  Muscle weakness (generalized)  Difficulty in walking, not elsewhere classified     Problem List Patient Active Problem List   Diagnosis Date Noted   Allergic rhinitis 01/11/2021   Chronic kidney disease due to hypertension 01/11/2021   Chronic kidney disease, stage 3a ([place]) 01/11/2021   Collagenous colitis 01/11/2021   Hardening of  the aorta (main artery of the heart) ([place]) 01/11/2021   Hyperlipidemia 01/11/2021   Occlusion and stenosis of bilateral carotid arteries 01/11/2021   Primary localized osteoarthritis of pelvic region and thigh 01/11/2021   Urinary incontinence 01/11/2021   Unilateral primary osteoarthritis, right hip 05/02/2020   Low back pain 05/02/2020   Diarrhea 07/26/2019   Gaseous regurgitation- burping 07/26/2019   Acute nonintractable headache 07/26/2019   Other fatigue 07/26/2019   Irritable bowel syndrome type symptoms at this time with diarrhea 07/26/2019   Stress and adjustment reaction- loss of husband july 2020; settling estate now etc 07/26/2019   Vertigo 03/24/2019   Glucose intolerance (impaired glucose tolerance) 02/17/2019   Other specified hypothyroidism 02/17/2019   Caregiver stress 02/17/2019   Caregiver stress syndrome 07/27/2018   Vitamin D deficiency 09/30/2016   Obesity, Class I, BMI 30-34.9 09/30/2016   Right carotid bruit 06/07/2016   h/o Diverticulosis 06/07/2016   GERD (gastroesophageal reflux disease) 06/07/2016   Environmental and seasonal allergies 06/07/2016   Arthritis 06/07/2016   Moderate osteopenia 06/07/2016   Bilateral carotid artery disease ([place]) 06/10/2012   Hypothyroidism, postsurgical 05/05/2012   Dyslipidemia- low HDL and inc VLDL & TG 04/04/2011   Benign hypertensive heart disease without heart failure 04/04/2011    Scot Jun,  PT, DPT, OCS, ATC 07/12/21  2:29 PM    [facility] Physical Therapy [address] [place], [place], [address] Phone: [phone]   Fax:  [phone]  Name: [name] MRN: [id number] Date of Birth: [birth year]

## 2021-07-17 ENCOUNTER — Ambulatory Visit: Payer: Medicare Other | Admitting: Physical Therapy

## 2021-07-17 ENCOUNTER — Other Ambulatory Visit: Payer: Self-pay

## 2021-07-17 DIAGNOSIS — R262 Difficulty in walking, not elsewhere classified: Secondary | ICD-10-CM

## 2021-07-17 DIAGNOSIS — G8929 Other chronic pain: Secondary | ICD-10-CM | POA: Diagnosis not present

## 2021-07-17 DIAGNOSIS — M5441 Lumbago with sciatica, right side: Secondary | ICD-10-CM

## 2021-07-17 DIAGNOSIS — M6281 Muscle weakness (generalized): Secondary | ICD-10-CM | POA: Diagnosis not present

## 2021-07-17 NOTE — Therapy (Signed)
[facility] Physical Therapy [address] [place], [place], [address] Phone: [phone]   Fax:  [phone]  Physical Therapy Treatment  Patient Details  Name: [name] MRN: [id number] Date of Birth: Apr 23, 1941 Referring Provider (PT): Joni Fears MD   Encounter Date: 07/17/2021   PT End of Session - 07/17/21 0815     Visit Number 3    Number of Visits 12    Date for PT Re-Evaluation 08/24/21    Authorization Type UHC medicare $30    Progress Note Due on Visit 10    PT Start Time 0804    PT Stop Time 0845    PT Time Calculation (min) 41 min    Activity Tolerance Patient tolerated treatment well    Behavior During Therapy [facility] for tasks assessed/performed             Past Medical History:  Diagnosis Date   Arthritis    Back pain    had cortisone injection 6/15, Dr Durward Fortes   Carotid artery occlusion    Carotid bruit    Colitis, collagenous    Diverticulosis    GERD (gastroesophageal reflux disease)    Hyperlipidemia    Hypertension    Injury of left leg 06/05/12   Pt. fell   Peripheral vascular disease ([place])    Shingles outbreak 2017   Thyroid disease 02-27-12   lt. thyroid nodule, bx. done 5 yrs ago-now some enlargement is seen.    Past Surgical History:  Procedure Laterality Date   ABDOMINAL HYSTERECTOMY  01/1980   CAROTID ENDARTERECTOMY  1998   Stable since surgery -slight build up returning-follows with yrly Dopplers   CATARACT EXTRACTION, BILATERAL  2004 or 2005 per pt   COLONOSCOPY     EYE SURGERY  02-27-12   Bil. Cataract surgery   THYROID LOBECTOMY  03/02/2012   Procedure: THYROID LOBECTOMY;  Surgeon: Earnstine Regal, MD;  Location: [facility];  Service: General;  Laterality: Left;    There were no vitals filed for this visit.   Subjective Assessment - 07/17/21 0812     Subjective no back pain today, its more in her Rt hip, she does not rate intensity of pain. She feels the exercises are working so far    Pertinent  History arthritis, back pain, GERD, HTN, PVD, throid disease, thyroid disease, carotid artery occlusion, hyperlipidemia,    Limitations Walking;House hold activities;Standing    Diagnostic tests MRI: grade 1 anterolisthesis of L4-5, severe facet arthrosis with right lateral recess stenosis L4-5, L3-4 lateral recess stenosis.    Patient Stated Goals Get back to walking 1 mile.    Pain Onset More than a month ago              [facility] Adult PT Treatment/Exercise - 07/17/21 0001       Lumbar Exercises: Stretches   Single Knee to Chest Stretch Right;Left;2 reps;30 seconds    Double Knee to Chest Stretch 10 seconds    Double Knee to Chest Stretch Limitations 10 reps with feet on    Lower Trunk Rotation 10 seconds;5 reps    Figure 4 Stretch 2 reps;20 seconds      Lumbar Exercises: Aerobic   Recumbent Bike L3 X3 min then L1 X2 min due to report of her hip getting tired      Lumbar Exercises: Machines for Strengthening   Leg Press DL 75# X20, then SL 37# 2X10 bilat      Lumbar Exercises: Standing   Row 20  reps;Both    Theraband Level (Row) Level 3 (Green)    Other Standing Lumbar Exercises hip abd and hip extensions with green band around ankles X10 bilat      Lumbar Exercises: Supine   Clam 20 reps    Clam Limitations green    Bridge 3 seconds;15 reps    Straight Leg Raise 15 reps    Straight Leg Raises Limitations bilat                       PT Short Term Goals - 07/12/21 1424       PT SHORT TERM GOAL #1   Title Pt will be independent in her initial HEP.    Time 3    Period Weeks    Status On-going    Target Date 08/03/21      PT SHORT TERM GOAL #2   Title Pt will improve 5 time sit to stand to </= 14 seconds with no UE support.    Time 3    Period Weeks    Status On-going    Target Date 08/03/21               PT Long Term Goals - 07/10/21 1638       PT LONG TERM GOAL #1   Title Pt will be independent in her advanced HEP.    Time 6    Period  Weeks    Status New    Target Date 08/24/21      PT LONG TERM GOAL #2   Title Pt will increase her bilateral hip strength to 5/5 in order to improve funtional mobility.    Time 6    Period Weeks    Status New    Target Date 08/24/21      PT LONG TERM GOAL #3   Title Pt will be able to report pain of </= 2/10 with household chores.    Time 6    Period Weeks    Status New    Target Date 08/24/21      PT LONG TERM GOAL #4   Title Pt will be able to amb 1/2 mile with pain </= 2/10.    Time 6    Period Weeks    Status New    Target Date 08/24/21      PT LONG TERM GOAL #5   Title Pt will improve her FOTO to >/= 60 %    Baseline 51% on 07/10/2021    Time 6    Period Weeks    Status New    Target Date 08/24/21                   Plan - 07/17/21 4235     Clinical Impression Statement Continued with program of lumbar/hip stretching and strengthening, added a few more execises for this today with good overall tolerance by her and provided her green band to take home for clams, hip abd, and hip extensions to add into HEP. PT will continue to progress her functional abilities as tolerated.    Personal Factors and Comorbidities Comorbidity 3+    Comorbidities MRI: grade 1 anterolisthesis of L4-5, severe facet arthrosis with right lateral recess stenosis L4-5, L3-4 lateral recess stenosis.arthritis, back pain, GERD, HTN, PVD, throid disease, thyroid disease, carotid artery occlusion, hyperlipidemia,    Examination-Participation Restrictions Other;Community Activity    Stability/Clinical Decision Making Stable/Uncomplicated    Rehab Potential Good    PT Frequency  2x / week    PT Duration 6 weeks    PT Treatment/Interventions ADLs/Self Care Home Management;Electrical Stimulation;Cryotherapy;Iontophoresis 4mg /ml Dexamethasone;Moist Heat;Traction;Ultrasound;Gait training;Stair training;Functional mobility training;Therapeutic activities;Therapeutic exercise;Balance  training;Neuromuscular re-education;Cognitive remediation;Patient/family education;Manual techniques;Dry needling;Taping;Passive range of motion;Spinal Manipulations;Joint Manipulations    PT Next Visit Plan Continue lumbar/hip mobility gains, hip strengthening/core strengthening    PT Home Exercise Plan Access Code: [email or web address]  URL: [email or web address]  Date: 07/10/2021  Prepared by: Kearney Hard    Exercises  Supine Bridge - 2 x daily - 7 x weekly - 2 sets - 10 reps - 5 seconds hold  Supine Lower Trunk Rotation - 2 x daily - 7 x weekly - 5 reps - 20 seconds hold  Hooklying Single Knee to Chest Stretch - 2 x daily - 7 x weekly - 5 reps - 20 seconds hold  Supine Figure 4 Piriformis Stretch - 2 x daily - 7 x weekly - 5 reps - 20 seconds hold  Supine Active Straight Leg Raise - 2 x daily - 7 x weekly - 2 sets - 10 reps, added supine clams green X20, standing hip abd green x10, and standing hip extensions green X10    Consulted and Agree with Plan of Care Patient             Patient will benefit from skilled therapeutic intervention in order to improve the following deficits and impairments:  Pain, Postural dysfunction, Decreased strength, Decreased balance, Impaired flexibility, Decreased activity tolerance, Difficulty walking, Decreased range of motion, Decreased mobility  Visit Diagnosis: Chronic right-sided low back pain with right-sided sciatica  Muscle weakness (generalized)  Difficulty in walking, not elsewhere classified     Problem List Patient Active Problem List   Diagnosis Date Noted   Allergic rhinitis 01/11/2021   Chronic kidney disease due to hypertension 01/11/2021   Chronic kidney disease, stage 3a ([place]) 01/11/2021   Collagenous colitis 01/11/2021   Hardening of the aorta (main artery of the heart) ([place]) 01/11/2021   Hyperlipidemia 01/11/2021   Occlusion and stenosis of bilateral carotid arteries 01/11/2021   Primary localized osteoarthritis of pelvic  region and thigh 01/11/2021   Urinary incontinence 01/11/2021   Unilateral primary osteoarthritis, right hip 05/02/2020   Low back pain 05/02/2020   Diarrhea 07/26/2019   Gaseous regurgitation- burping 07/26/2019   Acute nonintractable headache 07/26/2019   Other fatigue 07/26/2019   Irritable bowel syndrome type symptoms at this time with diarrhea 07/26/2019   Stress and adjustment reaction- loss of husband july 2020; settling estate now etc 07/26/2019   Vertigo 03/24/2019   Glucose intolerance (impaired glucose tolerance) 02/17/2019   Other specified hypothyroidism 02/17/2019   Caregiver stress 02/17/2019   Caregiver stress syndrome 07/27/2018   Vitamin D deficiency 09/30/2016   Obesity, Class I, BMI 30-34.9 09/30/2016   Right carotid bruit 06/07/2016   h/o Diverticulosis 06/07/2016   GERD (gastroesophageal reflux disease) 06/07/2016   Environmental and seasonal allergies 06/07/2016   Arthritis 06/07/2016   Moderate osteopenia 06/07/2016   Bilateral carotid artery disease ([place]) 06/10/2012   Hypothyroidism, postsurgical 05/05/2012   Dyslipidemia- low HDL and inc VLDL & TG 04/04/2011   Benign hypertensive heart disease without heart failure 04/04/2011    Debbe Odea, PT,DPT 07/17/2021, 8:42 AM  [facility] Physical Therapy [address] [place], [place], [address] Phone: [phone]   Fax:  [phone]  Name: [name] MRN: [id number] Date of Birth: 23-Aug-1941

## 2021-07-20 ENCOUNTER — Encounter: Payer: Medicare Other | Admitting: Rehabilitative and Restorative Service Providers"

## 2021-07-23 ENCOUNTER — Other Ambulatory Visit: Payer: Self-pay | Admitting: Interventional Cardiology

## 2021-07-24 ENCOUNTER — Other Ambulatory Visit: Payer: Self-pay

## 2021-07-24 ENCOUNTER — Encounter: Payer: Self-pay | Admitting: Physical Therapy

## 2021-07-24 ENCOUNTER — Ambulatory Visit (INDEPENDENT_AMBULATORY_CARE_PROVIDER_SITE_OTHER): Payer: Medicare Other | Admitting: Physical Therapy

## 2021-07-24 DIAGNOSIS — R262 Difficulty in walking, not elsewhere classified: Secondary | ICD-10-CM

## 2021-07-24 DIAGNOSIS — M5441 Lumbago with sciatica, right side: Secondary | ICD-10-CM

## 2021-07-24 DIAGNOSIS — M6281 Muscle weakness (generalized): Secondary | ICD-10-CM

## 2021-07-24 DIAGNOSIS — G8929 Other chronic pain: Secondary | ICD-10-CM | POA: Diagnosis not present

## 2021-07-24 NOTE — Therapy (Signed)
[facility] [address] [place], [place], [address] Phone: [phone]   Fax:  [phone]  Physical Therapy Treatment  Patient Details  Name: [name] MRN: [id number] Date of Birth: 1941/02/15 Referring Provider (PT): Joni Fears MD   Encounter Date: 07/24/2021   PT End of Session - 07/24/21 1104     Visit Number 4    Number of Visits 12    Date for PT Re-Evaluation 08/24/21    Authorization Type UHC medicare $30    Progress Note Due on Visit 10    PT Start Time 1058    PT Stop Time 1138    PT Time Calculation (min) 40 min    Activity Tolerance Patient tolerated treatment well    Behavior During Therapy [facility] for tasks assessed/performed             Past Medical History:  Diagnosis Date   Arthritis    Back pain    had cortisone injection 6/15, Dr Durward Fortes   Carotid artery occlusion    Carotid bruit    Colitis, collagenous    Diverticulosis    GERD (gastroesophageal reflux disease)    Hyperlipidemia    Hypertension    Injury of left leg 06/05/12   Pt. fell   Peripheral vascular disease ([place])    Shingles outbreak 2017   Thyroid disease 02-27-12   lt. thyroid nodule, bx. done 5 yrs ago-now some enlargement is seen.    Past Surgical History:  Procedure Laterality Date   ABDOMINAL HYSTERECTOMY  01/1980   CAROTID ENDARTERECTOMY  1998   Stable since surgery -slight build up returning-follows with yrly Dopplers   CATARACT EXTRACTION, BILATERAL  2004 or 2005 per pt   COLONOSCOPY     EYE SURGERY  02-27-12   Bil. Cataract surgery   THYROID LOBECTOMY  03/02/2012   Procedure: THYROID LOBECTOMY;  Surgeon: Earnstine Regal, MD;  Location: [facility];  Service: General;  Laterality: Left;    There were no vitals filed for this visit.   Subjective Assessment - 07/24/21 1059     Subjective Pt reporting no back pain. Pt reporting 4/10 pain in her right hip. Pt stating she has been able to navigate her steps with step over step  pattern with holding onto rail. Pt stating she thinks it may be time to get another hip injection.    Pertinent History arthritis, back pain, GERD, HTN, PVD, throid disease, thyroid disease, carotid artery occlusion, hyperlipidemia,    Limitations Walking;House hold activities;Standing    Diagnostic tests MRI: grade 1 anterolisthesis of L4-5, severe facet arthrosis with right lateral recess stenosis L4-5, L3-4 lateral recess stenosis.    Patient Stated Goals Get back to walking 1 mile.    Currently in Pain? Yes    Pain Score 4     Pain Location Hip    Pain Orientation Right    Pain Descriptors / Indicators Aching    Pain Type Chronic pain    Pain Onset More than a month ago                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    Referring Provider (PT) Joni Fears MD      Transfers   Five time sit to stand comments  12 seconds no UE support  [facility] PT Treatment/Exercise - 07/24/21 0001       Lumbar Exercises: Stretches   Single Knee to Chest Stretch Right;Left;2 reps;30 seconds    Double Knee to Chest Stretch 10 seconds    Double Knee to Chest Stretch Limitations 10 reps with feet on    Lower Trunk Rotation 10 seconds;5 reps    Piriformis Stretch 2 reps;20 seconds    Figure 4 Stretch 2 reps;20 seconds      Lumbar Exercises: Aerobic   Nustep L6 x 10 minutes      Lumbar Exercises: Standing   Other Standing Lumbar Exercises hip abd and hip extensions with green band around ankles X10 bilat      Lumbar Exercises: Seated   Sit to Stand 10 reps      Lumbar Exercises: Supine   Bridge 10 reps;5 seconds      Lumbar Exercises: Sidelying   Hip Abduction Both;10 reps;2 seconds    Hip Abduction Limitations 2 sets      Manual Therapy   Manual therapy comments percussion to right lateral hip, glutes                        PT Short Term Goals - 07/24/21 1114       PT SHORT TERM GOAL #1   Title Pt will be independent in her initial HEP.    Status Achieved      PT SHORT TERM GOAL #2   Title Pt will improve 5 time sit to stand to </= 14 seconds with no UE support.    Baseline 12 seconds no UE support    Status Achieved               PT Long Term Goals - 07/24/21 1115       PT LONG TERM GOAL #1   Title Pt will be independent in her advanced HEP.    Status On-going      PT LONG TERM GOAL #2   Title Pt will increase her bilateral hip strength to 5/5 in order to improve funtional mobility.    Status On-going      PT LONG TERM GOAL #3   Title Pt will be able to report pain of </= 2/10 with household chores.    Status On-going      PT LONG TERM GOAL #4   Title Pt will be able to amb 1/2 mile with pain </= 2/10.    Status On-going      PT LONG TERM GOAL #5   Title Pt will improve her FOTO to >/= 60 %    Status On-going                   Plan - 07/24/21 1105     Clinical Impression Statement Pt tolerating exercises well for core strengthening and lumba stretching. Pt reporting increased functional mobiity at home being able to perform step over step when entering her home from her garage. Pt also reporting compliance in her HEP. Pt feels she may need to get another hip injection because they have seemed to help in the past in her right hip. Pt has met her STG's set.  Continue skilled PT toward long term goals set.    Personal Factors and Comorbidities Comorbidity 3+    Comorbidities MRI: grade 1 anterolisthesis of L4-5, severe facet arthrosis with right lateral recess stenosis L4-5, L3-4 lateral recess stenosis.arthritis, back pain, GERD, HTN, PVD, throid disease,  thyroid disease, carotid artery occlusion, hyperlipidemia,    Examination-Participation Restrictions Other;Community Activity    Stability/Clinical Decision Making Stable/Uncomplicated    Rehab  Potential Good    PT Frequency 2x / week    PT Duration 6 weeks    PT Treatment/Interventions ADLs/Self Care Home Management;Electrical Stimulation;Cryotherapy;Iontophoresis 4mg /ml Dexamethasone;Moist Heat;Traction;Ultrasound;Gait training;Stair training;Functional mobility training;Therapeutic activities;Therapeutic exercise;Balance training;Neuromuscular re-education;Cognitive remediation;Patient/family education;Manual techniques;Dry needling;Taping;Passive range of motion;Spinal Manipulations;Joint Manipulations    PT Next Visit Plan Continue lumbar/hip mobility gains, hip strengthening/core strengthening    PT Home Exercise Plan Access Code: [email or web address]  URL: [email or web address]  Date: 07/10/2021  Prepared by: Kearney Hard    Exercises  Supine Bridge - 2 x daily - 7 x weekly - 2 sets - 10 reps - 5 seconds hold  Supine Lower Trunk Rotation - 2 x daily - 7 x weekly - 5 reps - 20 seconds hold  Hooklying Single Knee to Chest Stretch - 2 x daily - 7 x weekly - 5 reps - 20 seconds hold  Supine Figure 4 Piriformis Stretch - 2 x daily - 7 x weekly - 5 reps - 20 seconds hold  Supine Active Straight Leg Raise - 2 x daily - 7 x weekly - 2 sets - 10 reps, added supine clams green X20, standing hip abd green x10, and standing hip extensions green X10    Consulted and Agree with Plan of Care Patient             Patient will benefit from skilled therapeutic intervention in order to improve the following deficits and impairments:  Pain, Postural dysfunction, Decreased strength, Decreased balance, Impaired flexibility, Decreased activity tolerance, Difficulty walking, Decreased range of motion, Decreased mobility  Visit Diagnosis: Chronic right-sided low back pain with right-sided sciatica  Muscle weakness (generalized)  Difficulty in walking, not elsewhere classified     Problem List Patient Active Problem List   Diagnosis Date Noted   Allergic rhinitis 01/11/2021   Chronic kidney  disease due to hypertension 01/11/2021   Chronic kidney disease, stage 3a ([place]) 01/11/2021   Collagenous colitis 01/11/2021   Hardening of the aorta (main artery of the heart) ([place]) 01/11/2021   Hyperlipidemia 01/11/2021   Occlusion and stenosis of bilateral carotid arteries 01/11/2021   Primary localized osteoarthritis of pelvic region and thigh 01/11/2021   Urinary incontinence 01/11/2021   Unilateral primary osteoarthritis, right hip 05/02/2020   Low back pain 05/02/2020   Diarrhea 07/26/2019   Gaseous regurgitation- burping 07/26/2019   Acute nonintractable headache 07/26/2019   Other fatigue 07/26/2019   Irritable bowel syndrome type symptoms at this time with diarrhea 07/26/2019   Stress and adjustment reaction- loss of husband july 2020; settling estate now etc 07/26/2019   Vertigo 03/24/2019   Glucose intolerance (impaired glucose tolerance) 02/17/2019   Other specified hypothyroidism 02/17/2019   Caregiver stress 02/17/2019   Caregiver stress syndrome 07/27/2018   Vitamin D deficiency 09/30/2016   Obesity, Class I, BMI 30-34.9 09/30/2016   Right carotid bruit 06/07/2016   h/o Diverticulosis 06/07/2016   GERD (gastroesophageal reflux disease) 06/07/2016   Environmental and seasonal allergies 06/07/2016   Arthritis 06/07/2016   Moderate osteopenia 06/07/2016   Bilateral carotid artery disease ([place]) 06/10/2012   Hypothyroidism, postsurgical 05/05/2012   Dyslipidemia- low HDL and inc VLDL & TG 04/04/2011   Benign hypertensive heart disease without heart failure 04/04/2011    Oretha Caprice, PT, MPT 07/24/2021, 11:42 AM  [facility] [address] [place], [place], [address] Phone: [phone]  Fax:  [phone]  Name: [name] MRN: [id number] Date of Birth: [birth year]

## 2021-07-27 ENCOUNTER — Ambulatory Visit: Payer: Medicare Other | Admitting: Rehabilitative and Restorative Service Providers"

## 2021-07-27 ENCOUNTER — Encounter: Payer: Self-pay | Admitting: Rehabilitative and Restorative Service Providers"

## 2021-07-27 ENCOUNTER — Other Ambulatory Visit: Payer: Self-pay

## 2021-07-27 DIAGNOSIS — M5441 Lumbago with sciatica, right side: Secondary | ICD-10-CM | POA: Diagnosis not present

## 2021-07-27 DIAGNOSIS — M6281 Muscle weakness (generalized): Secondary | ICD-10-CM

## 2021-07-27 DIAGNOSIS — G8929 Other chronic pain: Secondary | ICD-10-CM

## 2021-07-27 DIAGNOSIS — R262 Difficulty in walking, not elsewhere classified: Secondary | ICD-10-CM | POA: Diagnosis not present

## 2021-07-27 NOTE — Therapy (Signed)
[facility] [address] [place], [place], [address] Phone: [phone]   Fax:  [phone]  Physical Therapy Treatment  Patient Details  Name: [name] MRN: [id number] Date of Birth: 10/12/1940 Referring Provider (PT): Joni Fears MD   Encounter Date: 07/27/2021   PT End of Session - 07/27/21 1102     Visit Number 5    Number of Visits 12    Date for PT Re-Evaluation 08/24/21    Authorization Type UHC medicare $30    Progress Note Due on Visit 10    PT Start Time 1055    PT Stop Time 1134    PT Time Calculation (min) 39 min    Activity Tolerance Patient tolerated treatment well    Behavior During Therapy [facility] for tasks assessed/performed             Past Medical History:  Diagnosis Date   Arthritis    Back pain    had cortisone injection 6/15, Dr Durward Fortes   Carotid artery occlusion    Carotid bruit    Colitis, collagenous    Diverticulosis    GERD (gastroesophageal reflux disease)    Hyperlipidemia    Hypertension    Injury of left leg 06/05/12   Pt. fell   Peripheral vascular disease ([place])    Shingles outbreak 2017   Thyroid disease 02-27-12   lt. thyroid nodule, bx. done 5 yrs ago-now some enlargement is seen.    Past Surgical History:  Procedure Laterality Date   ABDOMINAL HYSTERECTOMY  01/1980   CAROTID ENDARTERECTOMY  1998   Stable since surgery -slight build up returning-follows with yrly Dopplers   CATARACT EXTRACTION, BILATERAL  2004 or 2005 per pt   COLONOSCOPY     EYE SURGERY  02-27-12   Bil. Cataract surgery   THYROID LOBECTOMY  03/02/2012   Procedure: THYROID LOBECTOMY;  Surgeon: Earnstine Regal, MD;  Location: [facility];  Service: General;  Laterality: Left;    There were no vitals filed for this visit.   Subjective Assessment - 07/27/21 1101     Subjective Pt. indicated no back pain gain.  Pt. stated mild complaints in Rt hip c walking into clinic today but nothing at rest in chair.  Pt. indicated  feeling like the muscle work last visit was helpful some.    Pertinent History arthritis, back pain, GERD, HTN, PVD, throid disease, thyroid disease, carotid artery occlusion, hyperlipidemia,    Limitations Walking;House hold activities;Standing    Diagnostic tests MRI: grade 1 anterolisthesis of L4-5, severe facet arthrosis with right lateral recess stenosis L4-5, L3-4 lateral recess stenosis.    Patient Stated Goals Get back to walking 1 mile.    Currently in Pain? Yes    Pain Score 2    mild   Pain Location Hip    Pain Orientation Right;Lateral    Pain Descriptors / Indicators Aching;Sore;Tender    Pain Type Chronic pain    Pain Onset More than a month ago    Pain Frequency Intermittent    Aggravating Factors  standing, walking, pressure    Pain Relieving Factors rest, last visit stuff                               [facility] Adult PT Treatment/Exercise - 07/27/21 0001       Lumbar Exercises: Stretches   Lower Trunk Rotation 10 seconds;3 reps   bilateral   Piriformis Stretch  20 seconds;3 reps;Left;Right    Figure 4 Stretch 20 seconds;3 reps   bilateral     Lumbar Exercises: Aerobic   Nustep Lvl 6 10 mins UE/LE      Lumbar Exercises: Supine   Bridge 15 reps;5 seconds      Lumbar Exercises: Sidelying   Hip Abduction Right   2 x 10 (cues for techniques - leading with heel)     Manual Therapy   Manual therapy comments percussive device to Rt glute med/min, compression c movement to same                       PT Short Term Goals - 07/24/21 1114       PT SHORT TERM GOAL #1   Title Pt will be independent in her initial HEP.    Status Achieved      PT SHORT TERM GOAL #2   Title Pt will improve 5 time sit to stand to </= 14 seconds with no UE support.    Baseline 12 seconds no UE support    Status Achieved               PT Long Term Goals - 07/24/21 1115       PT LONG TERM GOAL #1   Title Pt will be independent in her advanced HEP.     Status On-going      PT LONG TERM GOAL #2   Title Pt will increase her bilateral hip strength to 5/5 in order to improve funtional mobility.    Status On-going      PT LONG TERM GOAL #3   Title Pt will be able to report pain of </= 2/10 with household chores.    Status On-going      PT LONG TERM GOAL #4   Title Pt will be able to amb 1/2 mile with pain </= 2/10.    Status On-going      PT LONG TERM GOAL #5   Title Pt will improve her FOTO to >/= 60 %    Status On-going                   Plan - 07/27/21 1121     Clinical Impression Statement Continued improvement reported in back symptoms.  Rt lateral hip symptoms most evident c palpation in Rt glute med/min trigger points.  Positive improvement noted from manual release techniques.  Pt. to benefit from continued manual myofascial release and progressive strengthening.    Personal Factors and Comorbidities Comorbidity 3+    Comorbidities MRI: grade 1 anterolisthesis of L4-5, severe facet arthrosis with right lateral recess stenosis L4-5, L3-4 lateral recess stenosis.arthritis, back pain, GERD, HTN, PVD, throid disease, thyroid disease, carotid artery occlusion, hyperlipidemia,    Examination-Participation Restrictions Other;Community Activity    Stability/Clinical Decision Making Stable/Uncomplicated    Rehab Potential Good    PT Frequency 2x / week    PT Duration 6 weeks    PT Treatment/Interventions ADLs/Self Care Home Management;Electrical Stimulation;Cryotherapy;Iontophoresis 4mg /ml Dexamethasone;Moist Heat;Traction;Ultrasound;Gait training;Stair training;Functional mobility training;Therapeutic activities;Therapeutic exercise;Balance training;Neuromuscular re-education;Cognitive remediation;Patient/family education;Manual techniques;Dry needling;Taping;Passive range of motion;Spinal Manipulations;Joint Manipulations    PT Next Visit Plan Myofascial release to Rt lateral hip    PT Home Exercise Plan Access Code:  [email or web address]  URL: [email or web address]  Date: 07/10/2021  Prepared by: Kearney Hard    Exercises  Supine Bridge - 2 x daily - 7 x weekly - 2 sets - 10 reps - 5 seconds hold  Supine Lower Trunk Rotation - 2 x daily - 7 x weekly - 5 reps - 20 seconds hold  Hooklying Single Knee to Chest Stretch - 2 x daily - 7 x weekly - 5 reps - 20 seconds hold  Supine Figure 4 Piriformis Stretch - 2 x daily - 7 x weekly - 5 reps - 20 seconds hold  Supine Active Straight Leg Raise - 2 x daily - 7 x weekly - 2 sets - 10 reps, added supine clams green X20, standing hip abd green x10, and standing hip extensions green X10    Consulted and Agree with Plan of Care Patient             Patient will benefit from skilled therapeutic intervention in order to improve the following deficits and impairments:  Pain, Postural dysfunction, Decreased strength, Decreased balance, Impaired flexibility, Decreased activity tolerance, Difficulty walking, Decreased range of motion, Decreased mobility  Visit Diagnosis: Chronic right-sided low back pain with right-sided sciatica  Muscle weakness (generalized)  Difficulty in walking, not elsewhere classified     Problem List Patient Active Problem List   Diagnosis Date Noted   Allergic rhinitis 01/11/2021   Chronic kidney disease due to hypertension 01/11/2021   Chronic kidney disease, stage 3a ([place]) 01/11/2021   Collagenous colitis 01/11/2021   Hardening of the aorta (main artery of the heart) ([place]) 01/11/2021   Hyperlipidemia 01/11/2021   Occlusion and stenosis of bilateral carotid arteries 01/11/2021   Primary localized osteoarthritis of pelvic region and thigh 01/11/2021   Urinary incontinence 01/11/2021   Unilateral primary osteoarthritis, right hip 05/02/2020   Low back pain 05/02/2020   Diarrhea 07/26/2019   Gaseous regurgitation- burping 07/26/2019   Acute nonintractable headache 07/26/2019   Other fatigue 07/26/2019   Irritable bowel syndrome type  symptoms at this time with diarrhea 07/26/2019   Stress and adjustment reaction- loss of husband july 2020; settling estate now etc 07/26/2019   Vertigo 03/24/2019   Glucose intolerance (impaired glucose tolerance) 02/17/2019   Other specified hypothyroidism 02/17/2019   Caregiver stress 02/17/2019   Caregiver stress syndrome 07/27/2018   Vitamin D deficiency 09/30/2016   Obesity, Class I, BMI 30-34.9 09/30/2016   Right carotid bruit 06/07/2016   h/o Diverticulosis 06/07/2016   GERD (gastroesophageal reflux disease) 06/07/2016   Environmental and seasonal allergies 06/07/2016   Arthritis 06/07/2016   Moderate osteopenia 06/07/2016   Bilateral carotid artery disease ([place]) 06/10/2012   Hypothyroidism, postsurgical 05/05/2012   Dyslipidemia- low HDL and inc VLDL & TG 04/04/2011   Benign hypertensive heart disease without heart failure 04/04/2011   Scot Jun, PT, DPT, OCS, ATC 07/27/21  11:32 AM    [facility] [address] [place], [place], [address] Phone: [phone]   Fax:  [phone]  Name: [name] MRN: [id number] Date of Birth: 1941/07/24

## 2021-07-30 ENCOUNTER — Other Ambulatory Visit: Payer: Self-pay | Admitting: Interventional Cardiology

## 2021-07-30 ENCOUNTER — Encounter: Payer: Self-pay | Admitting: Physical Therapy

## 2021-07-30 ENCOUNTER — Ambulatory Visit: Payer: Medicare Other | Admitting: Physical Therapy

## 2021-07-30 ENCOUNTER — Other Ambulatory Visit: Payer: Self-pay

## 2021-07-30 DIAGNOSIS — M6281 Muscle weakness (generalized): Secondary | ICD-10-CM | POA: Diagnosis not present

## 2021-07-30 DIAGNOSIS — R262 Difficulty in walking, not elsewhere classified: Secondary | ICD-10-CM | POA: Diagnosis not present

## 2021-07-30 DIAGNOSIS — M5441 Lumbago with sciatica, right side: Secondary | ICD-10-CM | POA: Diagnosis not present

## 2021-07-30 DIAGNOSIS — G8929 Other chronic pain: Secondary | ICD-10-CM

## 2021-07-30 NOTE — Therapy (Signed)
[facility] [address] [place], [place], [address] Phone: [phone]   Fax:  [phone]  Physical Therapy Treatment  Patient Details  Name: [name] MRN: [id number] Date of Birth: Dec 18, 1940 Referring Provider (PT): Joni Fears MD   Encounter Date: 07/30/2021   PT End of Session - 07/30/21 1229     Visit Number 6    Number of Visits 12    Date for PT Re-Evaluation 08/24/21    Authorization Type UHC medicare $30    Progress Note Due on Visit 10    PT Start Time 1145    PT Stop Time 1225    PT Time Calculation (min) 40 min    Activity Tolerance Patient tolerated treatment well             Past Medical History:  Diagnosis Date   Arthritis    Back pain    had cortisone injection 6/15, Dr Durward Fortes   Carotid artery occlusion    Carotid bruit    Colitis, collagenous    Diverticulosis    GERD (gastroesophageal reflux disease)    Hyperlipidemia    Hypertension    Injury of left leg 06/05/12   Pt. fell   Peripheral vascular disease ([place])    Shingles outbreak 2017   Thyroid disease 02-27-12   lt. thyroid nodule, bx. done 5 yrs ago-now some enlargement is seen.    Past Surgical History:  Procedure Laterality Date   ABDOMINAL HYSTERECTOMY  01/1980   CAROTID ENDARTERECTOMY  1998   Stable since surgery -slight build up returning-follows with yrly Dopplers   CATARACT EXTRACTION, BILATERAL  2004 or 2005 per pt   COLONOSCOPY     EYE SURGERY  02-27-12   Bil. Cataract surgery   THYROID LOBECTOMY  03/02/2012   Procedure: THYROID LOBECTOMY;  Surgeon: Earnstine Regal, MD;  Location: [facility];  Service: General;  Laterality: Left;    There were no vitals filed for this visit.   Subjective Assessment - 07/30/21 1153     Subjective Pt arriving reporting right hip soreness following her last visit. Pt reporting she knows therapy is helping because she is now able to stand up from a chair without using her hands.    Pertinent History  arthritis, back pain, GERD, HTN, PVD, throid disease, thyroid disease, carotid artery occlusion, hyperlipidemia,    Limitations Walking;House hold activities;Standing    Diagnostic tests MRI: grade 1 anterolisthesis of L4-5, severe facet arthrosis with right lateral recess stenosis L4-5, L3-4 lateral recess stenosis.    Currently in Pain? Yes    Pain Location Hip    Pain Orientation Right    Pain Descriptors / Indicators Aching;Sore    Pain Type Chronic pain    Pain Onset More than a month ago                               [facility] Adult PT Treatment/Exercise - 07/30/21 0001       Lumbar Exercises: Stretches   Lower Trunk Rotation 3 reps;20 seconds   bilateral   Piriformis Stretch 20 seconds;3 reps;Left;Right    Figure 4 Stretch 20 seconds;3 reps   bilateral     Lumbar Exercises: Aerobic   Nustep Lvl 6 8 mins UE/LE      Lumbar Exercises: Machines for Strengthening   Leg Press Double knee 75#, 2x15      Lumbar Exercises: Standing   Other Standing Lumbar Exercises  lateral step up on 4 inch step x 15 with UE support    Other Standing Lumbar Exercises hip abd standing on Airex with UE support 2 x10      Lumbar Exercises: Supine   Bridge Limitations bridge on heels x 10, bridge with calves on red physioball x 10 holding 5 seconds each.      Lumbar Exercises: Sidelying   Clam Limitations reverse clams x 15    Hip Abduction Right   2 x 10 (cues for techniques - leading with heel)   Hip Abduction Limitations 2 sets      Manual Therapy   Manual therapy comments percussion to right glute min/med, compression with abduction and ER                       PT Short Term Goals - 07/24/21 1114       PT SHORT TERM GOAL #1   Title Pt will be independent in her initial HEP.    Status Achieved      PT SHORT TERM GOAL #2   Title Pt will improve 5 time sit to stand to </= 14 seconds with no UE support.    Baseline 12 seconds no UE support    Status Achieved                PT Long Term Goals - 07/30/21 1228       PT LONG TERM GOAL #1   Title Pt will be independent in her advanced HEP.    Status On-going      PT LONG TERM GOAL #2   Title Pt will increase her bilateral hip strength to 5/5 in order to improve funtional mobility.    Status On-going      PT LONG TERM GOAL #3   Title Pt will be able to report pain of </= 2/10 with household chores.    Status On-going      PT LONG TERM GOAL #4   Title Pt will be able to amb 1/2 mile with pain </= 2/10.    Status On-going      PT LONG TERM GOAL #5   Title Pt will improve her FOTO to >/= 60 %    Status On-going                   Plan - 07/30/21 1221     Clinical Impression Statement Pt arriving today reporting mild soreness in her right lateral hips and gluteals. Pt still with tenderness noted in right glute med/min with trigger points. Pt with good response to percussion with compression during exercises in sidelying. Continue skilled PT and manual therapy as pt tolerates to maximize pt's function.    Personal Factors and Comorbidities Comorbidity 3+    Comorbidities MRI: grade 1 anterolisthesis of L4-5, severe facet arthrosis with right lateral recess stenosis L4-5, L3-4 lateral recess stenosis.arthritis, back pain, GERD, HTN, PVD, throid disease, thyroid disease, carotid artery occlusion, hyperlipidemia,    Examination-Participation Restrictions Other;Community Activity    Stability/Clinical Decision Making Stable/Uncomplicated    Rehab Potential Good    PT Frequency 2x / week    PT Duration 6 weeks    PT Treatment/Interventions ADLs/Self Care Home Management;Electrical Stimulation;Cryotherapy;Iontophoresis 4mg /ml Dexamethasone;Moist Heat;Traction;Ultrasound;Gait training;Stair training;Functional mobility training;Therapeutic activities;Therapeutic exercise;Balance training;Neuromuscular re-education;Cognitive remediation;Patient/family education;Manual techniques;Dry  needling;Taping;Passive range of motion;Spinal Manipulations;Joint Manipulations    PT Next Visit Plan Myofascial release to Rt lateral hip, continue with overall strengthening of bilateral hips  PT Home Exercise Plan Access Code: [email or web address]  URL: [email or web address]  Date: 07/10/2021  Prepared by: Kearney Hard    Exercises  Supine Bridge - 2 x daily - 7 x weekly - 2 sets - 10 reps - 5 seconds hold  Supine Lower Trunk Rotation - 2 x daily - 7 x weekly - 5 reps - 20 seconds hold  Hooklying Single Knee to Chest Stretch - 2 x daily - 7 x weekly - 5 reps - 20 seconds hold  Supine Figure 4 Piriformis Stretch - 2 x daily - 7 x weekly - 5 reps - 20 seconds hold  Supine Active Straight Leg Raise - 2 x daily - 7 x weekly - 2 sets - 10 reps, added supine clams green X20, standing hip abd green x10, and standing hip extensions green X10    Consulted and Agree with Plan of Care Patient             Patient will benefit from skilled therapeutic intervention in order to improve the following deficits and impairments:  Pain, Postural dysfunction, Decreased strength, Decreased balance, Impaired flexibility, Decreased activity tolerance, Difficulty walking, Decreased range of motion, Decreased mobility  Visit Diagnosis: Chronic right-sided low back pain with right-sided sciatica  Muscle weakness (generalized)  Difficulty in walking, not elsewhere classified     Problem List Patient Active Problem List   Diagnosis Date Noted   Allergic rhinitis 01/11/2021   Chronic kidney disease due to hypertension 01/11/2021   Chronic kidney disease, stage 3a ([place]) 01/11/2021   Collagenous colitis 01/11/2021   Hardening of the aorta (main artery of the heart) ([place]) 01/11/2021   Hyperlipidemia 01/11/2021   Occlusion and stenosis of bilateral carotid arteries 01/11/2021   Primary localized osteoarthritis of pelvic region and thigh 01/11/2021   Urinary incontinence 01/11/2021   Unilateral primary  osteoarthritis, right hip 05/02/2020   Low back pain 05/02/2020   Diarrhea 07/26/2019   Gaseous regurgitation- burping 07/26/2019   Acute nonintractable headache 07/26/2019   Other fatigue 07/26/2019   Irritable bowel syndrome type symptoms at this time with diarrhea 07/26/2019   Stress and adjustment reaction- loss of husband july 2020; settling estate now etc 07/26/2019   Vertigo 03/24/2019   Glucose intolerance (impaired glucose tolerance) 02/17/2019   Other specified hypothyroidism 02/17/2019   Caregiver stress 02/17/2019   Caregiver stress syndrome 07/27/2018   Vitamin D deficiency 09/30/2016   Obesity, Class I, BMI 30-34.9 09/30/2016   Right carotid bruit 06/07/2016   h/o Diverticulosis 06/07/2016   GERD (gastroesophageal reflux disease) 06/07/2016   Environmental and seasonal allergies 06/07/2016   Arthritis 06/07/2016   Moderate osteopenia 06/07/2016   Bilateral carotid artery disease ([place]) 06/10/2012   Hypothyroidism, postsurgical 05/05/2012   Dyslipidemia- low HDL and inc VLDL & TG 04/04/2011   Benign hypertensive heart disease without heart failure 04/04/2011    Oretha Caprice, PT, MPT 07/30/2021, 12:31 PM  [facility] [address] [place], [place], [address] Phone: [phone]   Fax:  [phone]  Name: [name] MRN: [id number] Date of Birth: March 08, 1941

## 2021-08-03 ENCOUNTER — Ambulatory Visit: Payer: Medicare Other | Admitting: Rehabilitative and Restorative Service Providers"

## 2021-08-03 ENCOUNTER — Encounter: Payer: Self-pay | Admitting: Rehabilitative and Restorative Service Providers"

## 2021-08-03 ENCOUNTER — Other Ambulatory Visit: Payer: Self-pay

## 2021-08-03 DIAGNOSIS — M5441 Lumbago with sciatica, right side: Secondary | ICD-10-CM | POA: Diagnosis not present

## 2021-08-03 DIAGNOSIS — M6281 Muscle weakness (generalized): Secondary | ICD-10-CM | POA: Diagnosis not present

## 2021-08-03 DIAGNOSIS — G8929 Other chronic pain: Secondary | ICD-10-CM | POA: Diagnosis not present

## 2021-08-03 DIAGNOSIS — R262 Difficulty in walking, not elsewhere classified: Secondary | ICD-10-CM

## 2021-08-03 NOTE — Therapy (Signed)
[facility] [address] [place], [place], [address] Phone: [phone]   Fax:  [phone]  Physical Therapy Treatment  Patient Details  Name: [name] MRN: [id number] Date of Birth: 1941-03-23 Referring Provider (PT): Joni Fears MD   Encounter Date: 08/03/2021   PT End of Session - 08/03/21 1048     Visit Number 7    Number of Visits 12    Date for PT Re-Evaluation 08/24/21    Authorization Type UHC medicare $30    Progress Note Due on Visit 10    PT Start Time 1050    PT Stop Time 1129    PT Time Calculation (min) 39 min    Activity Tolerance Patient tolerated treatment well             Past Medical History:  Diagnosis Date   Arthritis    Back pain    had cortisone injection 6/15, Dr Durward Fortes   Carotid artery occlusion    Carotid bruit    Colitis, collagenous    Diverticulosis    GERD (gastroesophageal reflux disease)    Hyperlipidemia    Hypertension    Injury of left leg 06/05/12   Pt. fell   Peripheral vascular disease ([place])    Shingles outbreak 2017   Thyroid disease 02-27-12   lt. thyroid nodule, bx. done 5 yrs ago-now some enlargement is seen.    Past Surgical History:  Procedure Laterality Date   ABDOMINAL HYSTERECTOMY  01/1980   CAROTID ENDARTERECTOMY  1998   Stable since surgery -slight build up returning-follows with yrly Dopplers   CATARACT EXTRACTION, BILATERAL  2004 or 2005 per pt   COLONOSCOPY     EYE SURGERY  02-27-12   Bil. Cataract surgery   THYROID LOBECTOMY  03/02/2012   Procedure: THYROID LOBECTOMY;  Surgeon: Earnstine Regal, MD;  Location: [facility];  Service: General;  Laterality: Left;    There were no vitals filed for this visit.   Subjective Assessment - 08/03/21 1056     Subjective Pt. indicated over whole body soreness including arms and legs, noted after doing some workout around house this morning.  Pt. 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  No pain upon arrival sitting.    Pertinent History arthritis, back pain, GERD, HTN, PVD, throid disease, thyroid disease, carotid artery occlusion, hyperlipidemia,    Limitations Walking;House hold activities;Standing    Diagnostic tests MRI: grade 1 anterolisthesis of L4-5, severe facet arthrosis with right lateral recess stenosis L4-5, L3-4 lateral recess stenosis.    Currently in Pain? No/denies    Pain Score 0-No pain    Pain Onset More than a month ago                [facility] PT Assessment - 08/03/21 0001       Assessment   Medical Diagnosis M54.41 chronic right sided low back pain with right sided sciatica    Referring Provider (PT) Joni Fears MD                           [facility] Adult PT Treatment/Exercise - 08/03/21 0001       Lumbar Exercises: Stretches   Piriformis Stretch 20 seconds;3 reps;Left;Right    Figure 4 Stretch 20 seconds;3 reps      Lumbar Exercises: Aerobic   Nustep Lvl 6 12 min UE/LE  Lumbar Exercises: Machines for Strengthening   Leg Press Double leg 75 lbs 2 x 15      Lumbar Exercises: Supine   Bridge Other (comment)   2 x 10, bridge on heels     Lumbar Exercises: Sidelying   Clam Limitations reverse clams 2 x 10    Hip Abduction Right;Other (comment)   2 x 10     Manual Therapy   Manual therapy comments percussion to right glute min/med                       PT Short Term Goals - 07/24/21 1114       PT SHORT TERM GOAL #1   Title Pt will be independent in her initial HEP.    Status Achieved      PT SHORT TERM GOAL #2   Title Pt will improve 5 time sit to stand to </= 14 seconds with no UE support.    Baseline 12 seconds no UE support    Status Achieved               PT Long Term Goals - 07/30/21 1228       PT LONG TERM GOAL #1   Title Pt will be independent in her advanced HEP.    Status On-going      PT LONG TERM GOAL #2   Title Pt will  increase her bilateral hip strength to 5/5 in order to improve funtional mobility.    Status On-going      PT LONG TERM GOAL #3   Title Pt will be able to report pain of </= 2/10 with household chores.    Status On-going      PT LONG TERM GOAL #4   Title Pt will be able to amb 1/2 mile with pain </= 2/10.    Status On-going      PT LONG TERM GOAL #5   Title Pt will improve her FOTO to >/= 60 %    Status On-going                   Plan - 08/03/21 1119     Clinical Impression Statement Less specific pin point pain complaints indicated over last week.  Generalized soreness possibly due to activity level changes noted.  Continued strengthening to improve tolerance to activity warranted.    Personal Factors and Comorbidities Comorbidity 3+    Comorbidities MRI: grade 1 anterolisthesis of L4-5, severe facet arthrosis with right lateral recess stenosis L4-5, L3-4 lateral recess stenosis.arthritis, back pain, GERD, HTN, PVD, throid disease, thyroid disease, carotid artery occlusion, hyperlipidemia,    Examination-Participation Restrictions Other;Community Activity    Stability/Clinical Decision Making Stable/Uncomplicated    Rehab Potential Good    PT Frequency 2x / week    PT Duration 6 weeks    PT Treatment/Interventions ADLs/Self Care Home Management;Electrical Stimulation;Cryotherapy;Iontophoresis 4mg /ml Dexamethasone;Moist Heat;Traction;Ultrasound;Gait training;Stair training;Functional mobility training;Therapeutic activities;Therapeutic exercise;Balance training;Neuromuscular re-education;Cognitive remediation;Patient/family education;Manual techniques;Dry needling;Taping;Passive range of motion;Spinal Manipulations;Joint Manipulations    PT Next Visit Plan Myofascial release to Rt lateral hip, continue with overall strengthening    PT Home Exercise Plan Access Code: [email or web address]  URL: [email or web address]  Date: 07/10/2021  Prepared by: Kearney Hard    Exercises   Supine Bridge - 2 x daily - 7 x weekly - 2 sets - 10 reps - 5 seconds hold  Supine Lower Trunk Rotation - 2 x daily - 7 x weekly - 5 reps - 20  seconds hold  Hooklying Single Knee to Chest Stretch - 2 x daily - 7 x weekly - 5 reps - 20 seconds hold  Supine Figure 4 Piriformis Stretch - 2 x daily - 7 x weekly - 5 reps - 20 seconds hold  Supine Active Straight Leg Raise - 2 x daily - 7 x weekly - 2 sets - 10 reps, added supine clams green X20, standing hip abd green x10, and standing hip extensions green X10    Consulted and Agree with Plan of Care Patient             Patient will benefit from skilled therapeutic intervention in order to improve the following deficits and impairments:  Pain, Postural dysfunction, Decreased strength, Decreased balance, Impaired flexibility, Decreased activity tolerance, Difficulty walking, Decreased range of motion, Decreased mobility  Visit Diagnosis: Chronic right-sided low back pain with right-sided sciatica  Muscle weakness (generalized)  Difficulty in walking, not elsewhere classified     Problem List Patient Active Problem List   Diagnosis Date Noted   Allergic rhinitis 01/11/2021   Chronic kidney disease due to hypertension 01/11/2021   Chronic kidney disease, stage 3a ([place]) 01/11/2021   Collagenous colitis 01/11/2021   Hardening of the aorta (main artery of the heart) ([place]) 01/11/2021   Hyperlipidemia 01/11/2021   Occlusion and stenosis of bilateral carotid arteries 01/11/2021   Primary localized osteoarthritis of pelvic region and thigh 01/11/2021   Urinary incontinence 01/11/2021   Unilateral primary osteoarthritis, right hip 05/02/2020   Low back pain 05/02/2020   Diarrhea 07/26/2019   Gaseous regurgitation- burping 07/26/2019   Acute nonintractable headache 07/26/2019   Other fatigue 07/26/2019   Irritable bowel syndrome type symptoms at this time with diarrhea 07/26/2019   Stress and adjustment reaction- loss of husband july 2020;  settling estate now etc 07/26/2019   Vertigo 03/24/2019   Glucose intolerance (impaired glucose tolerance) 02/17/2019   Other specified hypothyroidism 02/17/2019   Caregiver stress 02/17/2019   Caregiver stress syndrome 07/27/2018   Vitamin D deficiency 09/30/2016   Obesity, Class I, BMI 30-34.9 09/30/2016   Right carotid bruit 06/07/2016   h/o Diverticulosis 06/07/2016   GERD (gastroesophageal reflux disease) 06/07/2016   Environmental and seasonal allergies 06/07/2016   Arthritis 06/07/2016   Moderate osteopenia 06/07/2016   Bilateral carotid artery disease ([place]) 06/10/2012   Hypothyroidism, postsurgical 05/05/2012   Dyslipidemia- low HDL and inc VLDL & TG 04/04/2011   Benign hypertensive heart disease without heart failure 04/04/2011    Scot Jun, PT, DPT, OCS, ATC 08/03/21  11:28 AM    [facility] [address] [place], [place], [address] Phone: [phone]   Fax:  [phone]  Name: [name] MRN: [id number] Date of Birth: [birth year]

## 2021-08-06 ENCOUNTER — Ambulatory Visit: Payer: Medicare Other | Admitting: Physical Therapy

## 2021-08-06 ENCOUNTER — Other Ambulatory Visit: Payer: Self-pay

## 2021-08-06 ENCOUNTER — Encounter: Payer: Self-pay | Admitting: Physical Therapy

## 2021-08-06 DIAGNOSIS — M6281 Muscle weakness (generalized): Secondary | ICD-10-CM | POA: Diagnosis not present

## 2021-08-06 DIAGNOSIS — M5441 Lumbago with sciatica, right side: Secondary | ICD-10-CM

## 2021-08-06 DIAGNOSIS — G8929 Other chronic pain: Secondary | ICD-10-CM | POA: Diagnosis not present

## 2021-08-06 NOTE — Therapy (Addendum)
[facility] [address] [place], [place], [address] Phone: [phone]   Fax:  [phone]  Physical Therapy Treatment  Patient Details  Name: [name] MRN: [id number] Date of Birth: 1941/04/22 Referring Provider (PT): Joni Fears MD   Encounter Date: 08/06/2021   PT End of Session - 08/06/21 1214     Visit Number 8    Number of Visits 12    Date for PT Re-Evaluation 08/24/21    Authorization Type UHC medicare $30    Progress Note Due on Visit 10    PT Start Time 1145    PT Stop Time 1223    PT Time Calculation (min) 38 min    Activity Tolerance Patient tolerated treatment well    Behavior During Therapy [facility] for tasks assessed/performed             Past Medical History:  Diagnosis Date   Arthritis    Back pain    had cortisone injection 6/15, Dr Durward Fortes   Carotid artery occlusion    Carotid bruit    Colitis, collagenous    Diverticulosis    GERD (gastroesophageal reflux disease)    Hyperlipidemia    Hypertension    Injury of left leg 06/05/12   Pt. fell   Peripheral vascular disease ([place])    Shingles outbreak 2017   Thyroid disease 02-27-12   lt. thyroid nodule, bx. done 5 yrs ago-now some enlargement is seen.    Past Surgical History:  Procedure Laterality Date   ABDOMINAL HYSTERECTOMY  01/1980   CAROTID ENDARTERECTOMY  1998   Stable since surgery -slight build up returning-follows with yrly Dopplers   CATARACT EXTRACTION, BILATERAL  2004 or 2005 per pt   COLONOSCOPY     EYE SURGERY  02-27-12   Bil. Cataract surgery   THYROID LOBECTOMY  03/02/2012   Procedure: THYROID LOBECTOMY;  Surgeon: Earnstine Regal, MD;  Location: [facility];  Service: General;  Laterality: Left;    There were no vitals filed for this visit.   Subjective Assessment - 08/06/21 1210     Subjective Pt arriving reporting mild soreness in left hip.  Pt stating her low back is doing much better. Pt stating she walked around [place] for a couple  of hours.    Pertinent History arthritis, back pain, GERD, HTN, PVD, throid disease, thyroid disease, carotid artery occlusion, hyperlipidemia,    Limitations Walking;House hold activities;Standing    Diagnostic tests MRI: grade 1 anterolisthesis of L4-5, severe facet arthrosis with right lateral recess stenosis L4-5, L3-4 lateral recess stenosis.    Currently in Pain? No/denies              Addendum: pain was in the right hip, not the left.  Kearney Hard, PT 08/14/21 11:11 AM                     [facility] Adult PT Treatment/Exercise - 08/06/21 0001       Neuro Re-ed    Neuro Re-ed Details  Side stepping 20 feet x 4 both directions, tandum walking 10 feet x 4, with close supervision      Lumbar Exercises: Stretches   Active Hamstring Stretch 2 reps;20 seconds;Right      Lumbar Exercises: Aerobic   Nustep Lvl 6 12 min UE/LE      Lumbar Exercises: Machines for Strengthening   Leg Press Double leg 75 lbs 2 x 15      Lumbar Exercises: Standing   Other  Standing Lumbar Exercises hip hike in door way x 5 holding 3-5 seconds    Other Standing Lumbar Exercises lateral step ups x 15      Lumbar Exercises: Supine   Bridge with Ball Squeeze 5 seconds;15 reps      Lumbar Exercises: Sidelying   Clam Limitations reverse clams 2 x 10    Hip Abduction Right;Other (comment)   2 x 10     Manual Therapy   Manual therapy comments IASTM to right glute min/med                       PT Short Term Goals - 07/24/21 1114       PT SHORT TERM GOAL #1   Title Pt will be independent in her initial HEP.    Status Achieved      PT SHORT TERM GOAL #2   Title Pt will improve 5 time sit to stand to </= 14 seconds with no UE support.    Baseline 12 seconds no UE support    Status Achieved               PT Long Term Goals - 08/06/21 1218       PT LONG TERM GOAL #1   Title Pt will be independent in her advanced HEP.    Status On-going      PT LONG TERM  GOAL #2   Title Pt will increase her bilateral hip strength to 5/5 in order to improve funtional mobility.    Status On-going      PT LONG TERM GOAL #3   Title Pt will be able to report pain of </= 2/10 with household chores.    Status On-going      PT LONG TERM GOAL #4   Title Pt will be able to amb 1/2 mile with pain </= 2/10.    Status On-going      PT LONG TERM GOAL #5   Title Pt will improve her FOTO to >/= 60 %    Status On-going                   Plan - 08/06/21 1215     Clinical Impression Statement Pt with reported soreness over later hip, glute min/med. IASTM performed with pt reporting less stiffness at end of session. Stretching and strengthening performed to progress toward pt's LTG's set.    Personal Factors and Comorbidities Comorbidity 3+    Comorbidities MRI: grade 1 anterolisthesis of L4-5, severe facet arthrosis with right lateral recess stenosis L4-5, L3-4 lateral recess stenosis.arthritis, back pain, GERD, HTN, PVD, throid disease, thyroid disease, carotid artery occlusion, hyperlipidemia,    Examination-Participation Restrictions Other;Community Activity    Stability/Clinical Decision Making Stable/Uncomplicated    Rehab Potential Good    PT Frequency 2x / week    PT Duration 6 weeks    PT Treatment/Interventions ADLs/Self Care Home Management;Electrical Stimulation;Cryotherapy;Iontophoresis 4mg /ml Dexamethasone;Moist Heat;Traction;Ultrasound;Gait training;Stair training;Functional mobility training;Therapeutic activities;Therapeutic exercise;Balance training;Neuromuscular re-education;Cognitive remediation;Patient/family education;Manual techniques;Dry needling;Taping;Passive range of motion;Spinal Manipulations;Joint Manipulations    PT Next Visit Plan Myofascial release to Rt lateral hip, continue with overall strengthening    PT Home Exercise Plan Access Code: [email or web address]  URL: [email or web address]  Date: 07/10/2021  Prepared by: Kearney Hard    Exercises  Supine Bridge - 2 x daily - 7 x weekly - 2 sets - 10 reps - 5 seconds hold  Supine Lower Trunk Rotation - 2 x daily - 7  x weekly - 5 reps - 20 seconds hold  Hooklying Single Knee to Chest Stretch - 2 x daily - 7 x weekly - 5 reps - 20 seconds hold  Supine Figure 4 Piriformis Stretch - 2 x daily - 7 x weekly - 5 reps - 20 seconds hold  Supine Active Straight Leg Raise - 2 x daily - 7 x weekly - 2 sets - 10 reps, added supine clams green X20, standing hip abd green x10, and standing hip extensions green X10    Consulted and Agree with Plan of Care Patient             Patient will benefit from skilled therapeutic intervention in order to improve the following deficits and impairments:  Pain, Postural dysfunction, Decreased strength, Decreased balance, Impaired flexibility, Decreased activity tolerance, Difficulty walking, Decreased range of motion, Decreased mobility  Visit Diagnosis: Chronic right-sided low back pain with right-sided sciatica  Muscle weakness (generalized)     Problem List Patient Active Problem List   Diagnosis Date Noted   Allergic rhinitis 01/11/2021   Chronic kidney disease due to hypertension 01/11/2021   Chronic kidney disease, stage 3a ([place]) 01/11/2021   Collagenous colitis 01/11/2021   Hardening of the aorta (main artery of the heart) ([place]) 01/11/2021   Hyperlipidemia 01/11/2021   Occlusion and stenosis of bilateral carotid arteries 01/11/2021   Primary localized osteoarthritis of pelvic region and thigh 01/11/2021   Urinary incontinence 01/11/2021   Unilateral primary osteoarthritis, right hip 05/02/2020   Low back pain 05/02/2020   Diarrhea 07/26/2019   Gaseous regurgitation- burping 07/26/2019   Acute nonintractable headache 07/26/2019   Other fatigue 07/26/2019   Irritable bowel syndrome type symptoms at this time with diarrhea 07/26/2019   Stress and adjustment reaction- loss of husband july 2020; settling estate now etc  07/26/2019   Vertigo 03/24/2019   Glucose intolerance (impaired glucose tolerance) 02/17/2019   Other specified hypothyroidism 02/17/2019   Caregiver stress 02/17/2019   Caregiver stress syndrome 07/27/2018   Vitamin D deficiency 09/30/2016   Obesity, Class I, BMI 30-34.9 09/30/2016   Right carotid bruit 06/07/2016   h/o Diverticulosis 06/07/2016   GERD (gastroesophageal reflux disease) 06/07/2016   Environmental and seasonal allergies 06/07/2016   Arthritis 06/07/2016   Moderate osteopenia 06/07/2016   Bilateral carotid artery disease ([place]) 06/10/2012   Hypothyroidism, postsurgical 05/05/2012   Dyslipidemia- low HDL and inc VLDL & TG 04/04/2011   Benign hypertensive heart disease without heart failure 04/04/2011    Oretha Caprice, PT, MPT 08/06/2021, 12:27 PM  [facility] [address] [place], [place], [address] Phone: [phone]   Fax:  [phone]  Name: [name] MRN: [id number] Date of Birth: 1941/02/26

## 2021-08-09 ENCOUNTER — Other Ambulatory Visit: Payer: Self-pay

## 2021-08-09 ENCOUNTER — Ambulatory Visit: Payer: Medicare Other | Admitting: Rehabilitative and Restorative Service Providers"

## 2021-08-09 ENCOUNTER — Encounter: Payer: Self-pay | Admitting: Rehabilitative and Restorative Service Providers"

## 2021-08-09 DIAGNOSIS — G8929 Other chronic pain: Secondary | ICD-10-CM

## 2021-08-09 DIAGNOSIS — M5441 Lumbago with sciatica, right side: Secondary | ICD-10-CM | POA: Diagnosis not present

## 2021-08-09 DIAGNOSIS — M6281 Muscle weakness (generalized): Secondary | ICD-10-CM

## 2021-08-09 DIAGNOSIS — R262 Difficulty in walking, not elsewhere classified: Secondary | ICD-10-CM

## 2021-08-09 NOTE — Therapy (Signed)
[facility] [address] [place], [place], [address] Phone: [phone]   Fax:  [phone]  Physical Therapy Treatment  Patient Details  Name: [name] MRN: [id number] Date of Birth: 1941-07-26 Referring Provider (PT): Joni Fears MD   Encounter Date: 08/09/2021   PT End of Session - 08/09/21 1127     Visit Number 9    Number of Visits 12    Date for PT Re-Evaluation 08/24/21    Authorization Type UHC medicare $30    Progress Note Due on Visit 10    PT Start Time 1104    PT Stop Time 1143    PT Time Calculation (min) 39 min    Activity Tolerance Patient tolerated treatment well    Behavior During Therapy [facility] for tasks assessed/performed             Past Medical History:  Diagnosis Date   Arthritis    Back pain    had cortisone injection 6/15, Dr Durward Fortes   Carotid artery occlusion    Carotid bruit    Colitis, collagenous    Diverticulosis    GERD (gastroesophageal reflux disease)    Hyperlipidemia    Hypertension    Injury of left leg 06/05/12   Pt. fell   Peripheral vascular disease ([place])    Shingles outbreak 2017   Thyroid disease 02-27-12   lt. thyroid nodule, bx. done 5 yrs ago-now some enlargement is seen.    Past Surgical History:  Procedure Laterality Date   ABDOMINAL HYSTERECTOMY  01/1980   CAROTID ENDARTERECTOMY  1998   Stable since surgery -slight build up returning-follows with yrly Dopplers   CATARACT EXTRACTION, BILATERAL  2004 or 2005 per pt   COLONOSCOPY     EYE SURGERY  02-27-12   Bil. Cataract surgery   THYROID LOBECTOMY  03/02/2012   Procedure: THYROID LOBECTOMY;  Surgeon: Earnstine Regal, MD;  Location: [facility];  Service: General;  Laterality: Left;    There were no vitals filed for this visit.   Subjective Assessment - 08/09/21 1108     Subjective Pt. indicated no complaints upon arrival today.  Indicated having some soreness complaints in shoulders and hips at times with general activity.     Pertinent History arthritis, back pain, GERD, HTN, PVD, throid disease, thyroid disease, carotid artery occlusion, hyperlipidemia,    Limitations Walking;House hold activities;Standing    Diagnostic tests MRI: grade 1 anterolisthesis of L4-5, severe facet arthrosis with right lateral recess stenosis L4-5, L3-4 lateral recess stenosis.    Currently in Pain? No/denies    Pain Score 0-No pain                [facility] PT Assessment - 08/09/21 0001       Observation/Other Assessments   Focus on Therapeutic Outcomes (FOTO)  updated 57%      Transfers   Five time sit to stand comments  10 seconds c no UE assist 18 inch chair                           [facility] Adult PT Treatment/Exercise - 08/09/21 0001       Lumbar Exercises: Aerobic   Nustep Lvl 6 6 mins, lvl 5 6 mins (12 mins total)      Lumbar Exercises: Machines for Strengthening   Leg Press 81 lbs 3 x 15 double leg      Lumbar Exercises: Standing   Other Standing Lumbar  Exercises hip hike in door way x 10 - 5 sec hold c cues throughout to improve technique    Other Standing Lumbar Exercises lateral step down focus 6 inch 2 x 10 bilateral      Manual Therapy   Manual therapy comments Pt. deferred today                       PT Short Term Goals - 07/24/21 1114       PT SHORT TERM GOAL #1   Title Pt will be independent in her initial HEP.    Status Achieved      PT SHORT TERM GOAL #2   Title Pt will improve 5 time sit to stand to </= 14 seconds with no UE support.    Baseline 12 seconds no UE support    Status Achieved               PT Long Term Goals - 08/06/21 1218       PT LONG TERM GOAL #1   Title Pt will be independent in her advanced HEP.    Status On-going      PT LONG TERM GOAL #2   Title Pt will increase her bilateral hip strength to 5/5 in order to improve funtional mobility.    Status On-going      PT LONG TERM GOAL #3   Title Pt will be able to report pain of  </= 2/10 with household chores.    Status On-going      PT LONG TERM GOAL #4   Title Pt will be able to amb 1/2 mile with pain </= 2/10.    Status On-going      PT LONG TERM GOAL #5   Title Pt will improve her FOTO to >/= 60 %    Status On-going                   Plan - 08/09/21 1129     Clinical Impression Statement Overall improvements per Pt. had her defer manual intervention today.  Discussed c Pt. the process and goals of progression towards established HEP transitioning as she felt comfortable and able.  Pt. indicated desire to keep next visit or two and will adjust as able. Continued focus on progression in strength in LE and mobility gains.    Personal Factors and Comorbidities Comorbidity 3+    Comorbidities MRI: grade 1 anterolisthesis of L4-5, severe facet arthrosis with right lateral recess stenosis L4-5, L3-4 lateral recess stenosis.arthritis, back pain, GERD, HTN, PVD, throid disease, thyroid disease, carotid artery occlusion, hyperlipidemia,    Examination-Participation Restrictions Other;Community Activity    Stability/Clinical Decision Making Stable/Uncomplicated    Rehab Potential Good    PT Frequency 2x / week    PT Duration 6 weeks    PT Treatment/Interventions ADLs/Self Care Home Management;Electrical Stimulation;Cryotherapy;Iontophoresis 4mg /ml Dexamethasone;Moist Heat;Traction;Ultrasound;Gait training;Stair training;Functional mobility training;Therapeutic activities;Therapeutic exercise;Balance training;Neuromuscular re-education;Cognitive remediation;Patient/family education;Manual techniques;Dry needling;Taping;Passive range of motion;Spinal Manipulations;Joint Manipulations    PT Next Visit Plan Manual as desired.  possible HEP transitioning goals.    PT Home Exercise Plan Access Code: [email or web address]  URL: [email or web address]  Date: 07/10/2021  Prepared by: Kearney Hard    Exercises  Supine Bridge - 2 x daily - 7 x weekly - 2 sets - 10 reps -  5 seconds hold  Supine Lower Trunk Rotation - 2 x daily - 7 x weekly - 5 reps - 20 seconds hold  Hooklying Single Knee  to Chest Stretch - 2 x daily - 7 x weekly - 5 reps - 20 seconds hold  Supine Figure 4 Piriformis Stretch - 2 x daily - 7 x weekly - 5 reps - 20 seconds hold  Supine Active Straight Leg Raise - 2 x daily - 7 x weekly - 2 sets - 10 reps, added supine clams green X20, standing hip abd green x10, and standing hip extensions green X10    Consulted and Agree with Plan of Care Patient             Patient will benefit from skilled therapeutic intervention in order to improve the following deficits and impairments:  Pain, Postural dysfunction, Decreased strength, Decreased balance, Impaired flexibility, Decreased activity tolerance, Difficulty walking, Decreased range of motion, Decreased mobility  Visit Diagnosis: Chronic right-sided low back pain with right-sided sciatica  Muscle weakness (generalized)  Difficulty in walking, not elsewhere classified     Problem List Patient Active Problem List   Diagnosis Date Noted   Allergic rhinitis 01/11/2021   Chronic kidney disease due to hypertension 01/11/2021   Chronic kidney disease, stage 3a ([place]) 01/11/2021   Collagenous colitis 01/11/2021   Hardening of the aorta (main artery of the heart) ([place]) 01/11/2021   Hyperlipidemia 01/11/2021   Occlusion and stenosis of bilateral carotid arteries 01/11/2021   Primary localized osteoarthritis of pelvic region and thigh 01/11/2021   Urinary incontinence 01/11/2021   Unilateral primary osteoarthritis, right hip 05/02/2020   Low back pain 05/02/2020   Diarrhea 07/26/2019   Gaseous regurgitation- burping 07/26/2019   Acute nonintractable headache 07/26/2019   Other fatigue 07/26/2019   Irritable bowel syndrome type symptoms at this time with diarrhea 07/26/2019   Stress and adjustment reaction- loss of husband july 2020; settling estate now etc 07/26/2019   Vertigo 03/24/2019    Glucose intolerance (impaired glucose tolerance) 02/17/2019   Other specified hypothyroidism 02/17/2019   Caregiver stress 02/17/2019   Caregiver stress syndrome 07/27/2018   Vitamin D deficiency 09/30/2016   Obesity, Class I, BMI 30-34.9 09/30/2016   Right carotid bruit 06/07/2016   h/o Diverticulosis 06/07/2016   GERD (gastroesophageal reflux disease) 06/07/2016   Environmental and seasonal allergies 06/07/2016   Arthritis 06/07/2016   Moderate osteopenia 06/07/2016   Bilateral carotid artery disease ([place]) 06/10/2012   Hypothyroidism, postsurgical 05/05/2012   Dyslipidemia- low HDL and inc VLDL & TG 04/04/2011   Benign hypertensive heart disease without heart failure 04/04/2011   Scot Jun, PT, DPT, OCS, ATC 08/09/21  11:40 AM     [facility] [address] [place], [place], [address] Phone: [phone]   Fax:  [phone]  Name: FLOYE FESLER MRN: [id number] Date of Birth: 06/23/1941

## 2021-08-10 IMAGING — MG MM DIGITAL SCREENING BILAT W/ TOMO AND CAD
6 of 12 series · 6 of 36 positions shown · non-contrast
Comparison: Previous exam(s).

CLINICAL DATA: Screening.

EXAM:
DIGITAL SCREENING BILATERAL MAMMOGRAM WITH TOMOSYNTHESIS AND CAD
TECHNIQUE: Bilateral screening digital craniocaudal and mediolateral oblique
mammograms were obtained. Bilateral screening digital breast
tomosynthesis was performed. The images were evaluated with
computer-aided detection.

[R MLO synth-2D (1 of 2)]
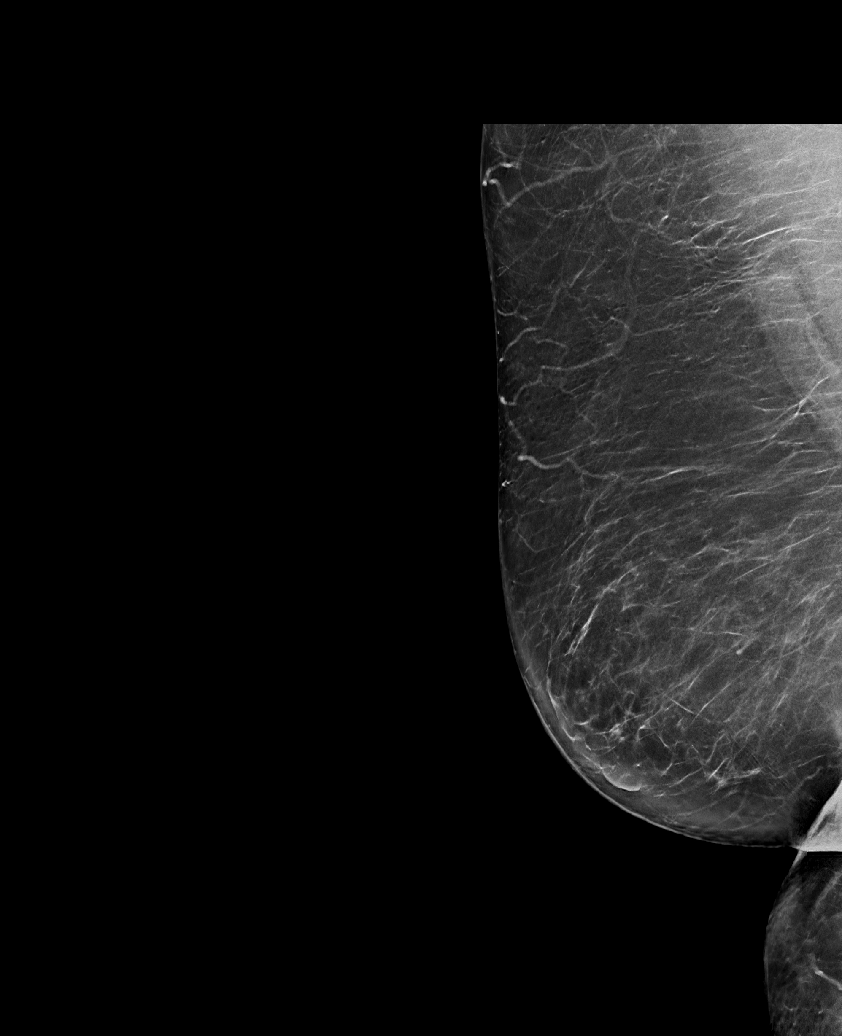

[L MLO synth-2D (1 of 2)]
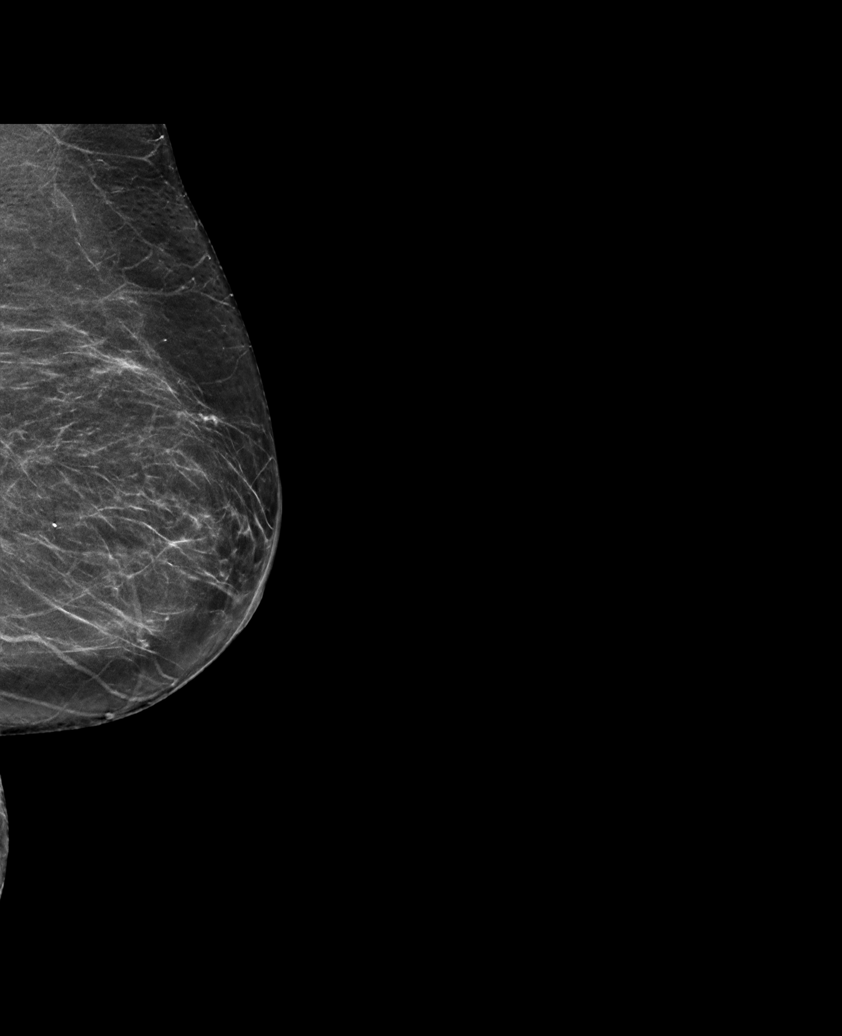

[R CC synth-2D]
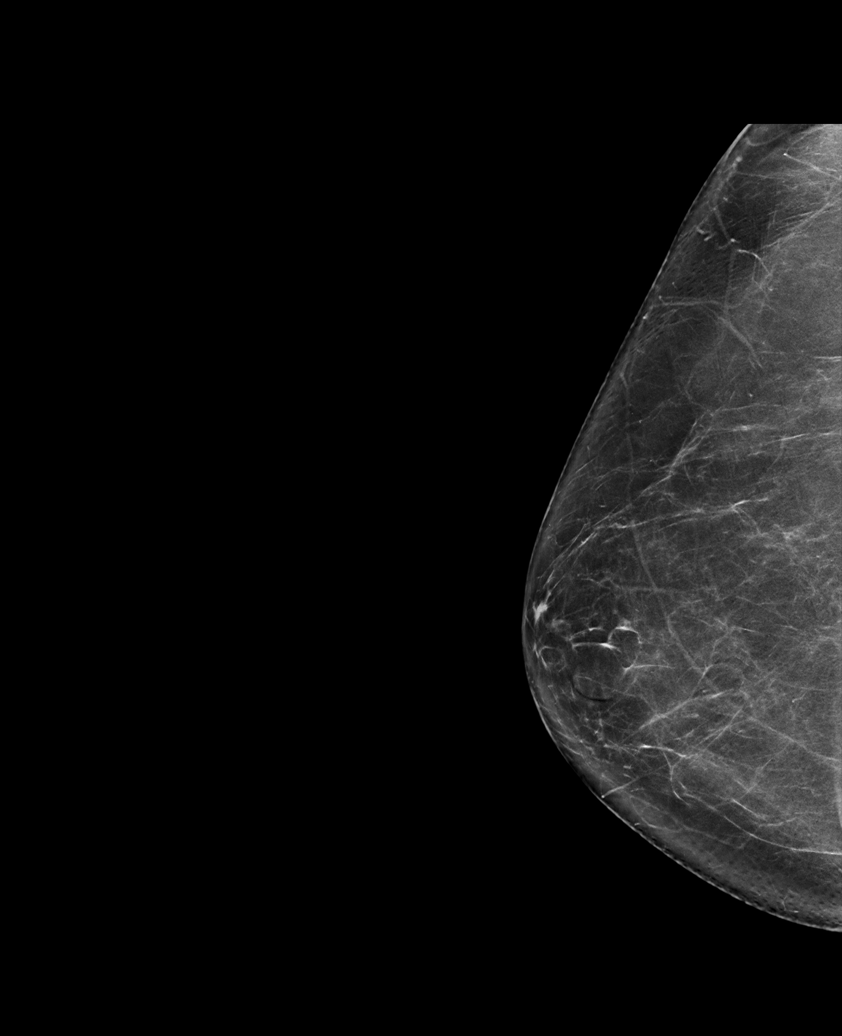

[R MLO synth-2D (2 of 2)]
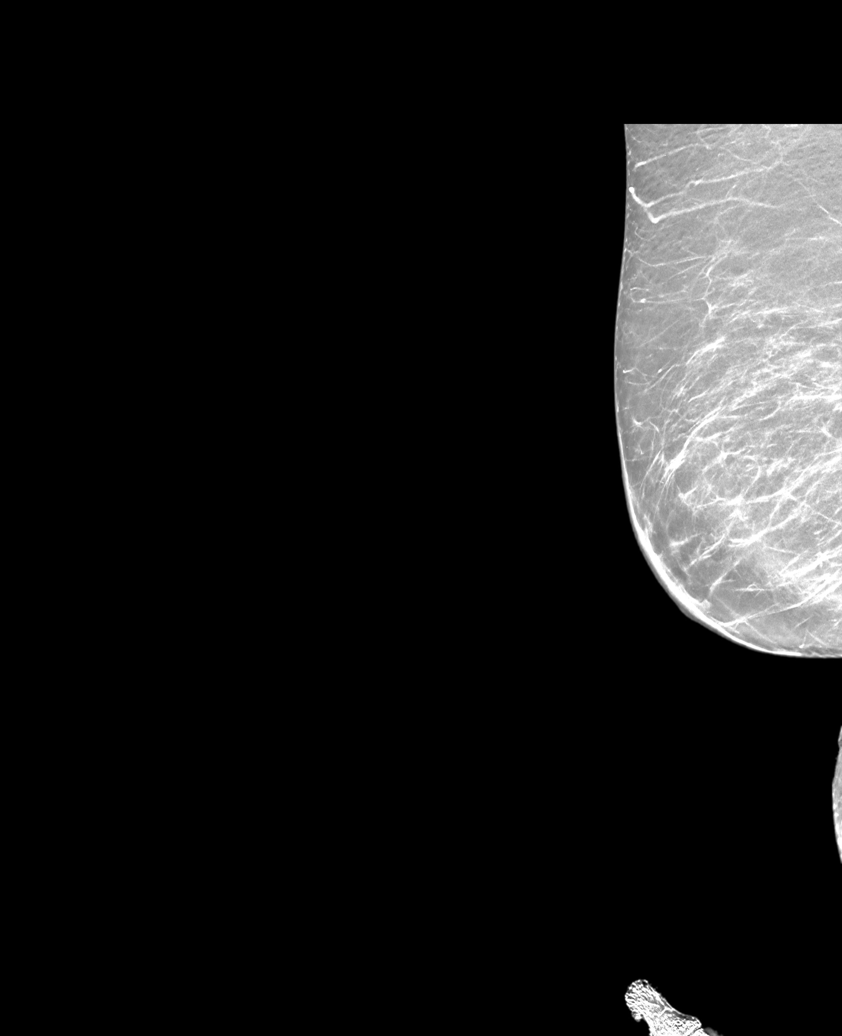

[L MLO synth-2D (2 of 2)]
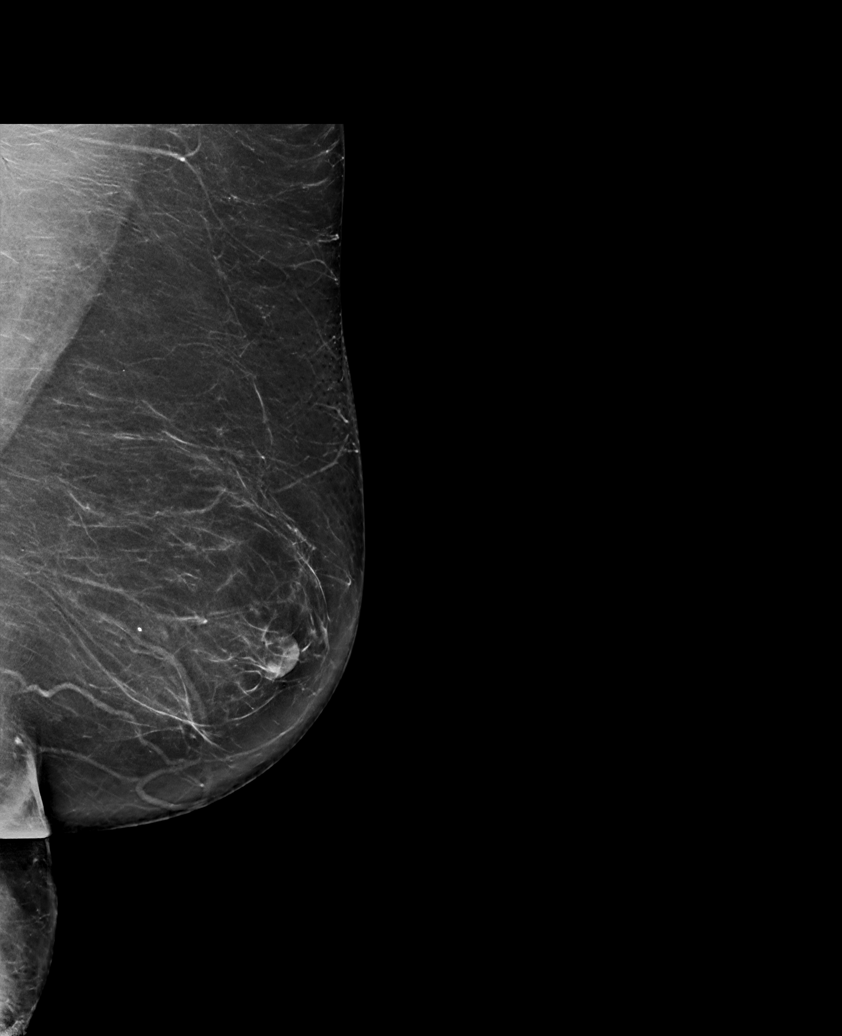

[L CC synth-2D]
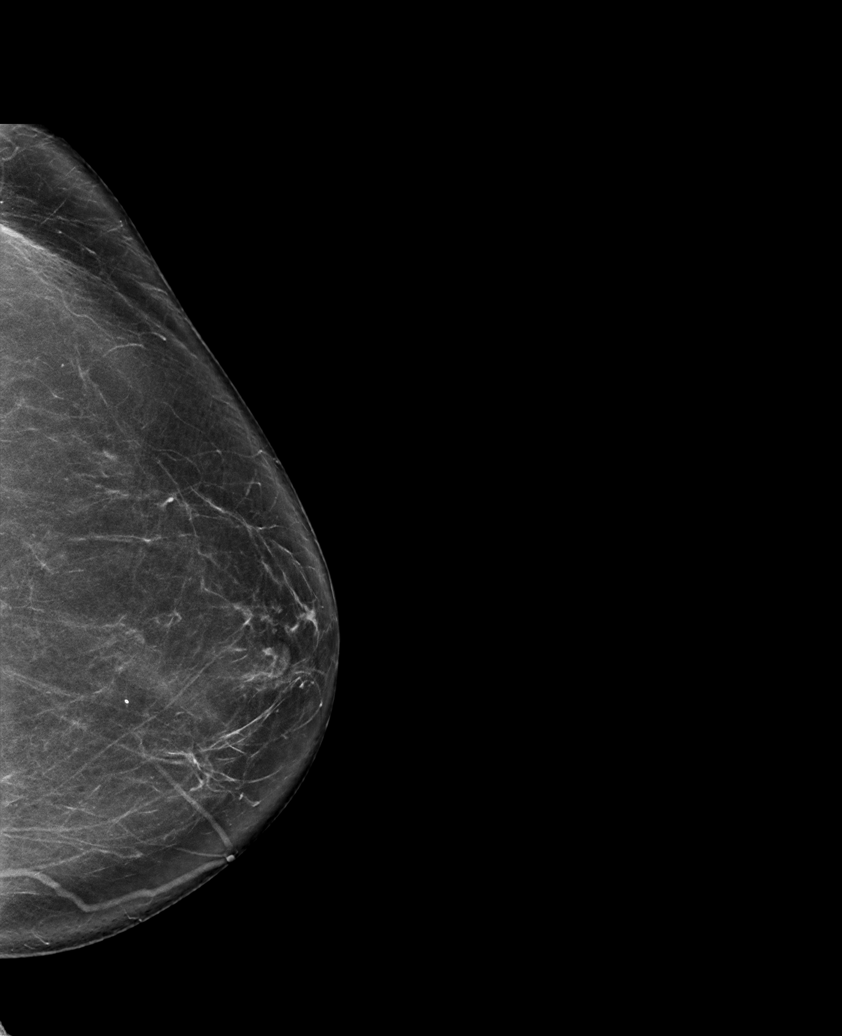

[6 of 36 positions shown; findings below may reference images not displayed]

ACR Breast Density Category b: There are scattered areas of
fibroglandular density.
FINDINGS: There are no findings suspicious for malignancy.
IMPRESSION: No mammographic evidence of malignancy. A result letter of this
screening mammogram will be mailed directly to the patient.

RECOMMENDATION:
Screening mammogram in one year. (Code:51-O-LD2)

BI-RADS CATEGORY  1: Negative.

## 2021-08-12 NOTE — Progress Notes (Signed)
Cardiology Office Note   Date:  08/13/2021   ID:  [name], DOB Mar 01, 1941, MRN [id number]  PCP:  Michael Boston, MD    No chief complaint on file.  PAD  Wt Readings from Last 3 Encounters:  08/13/21 185 lb (83.9 kg)  03/15/21 187 lb (84.8 kg)  03/08/21 187 lb (84.8 kg)       History of Present Illness: [name] is a 80 y.o. female   who has a past history of dyslipidemia.  She was previously followed by Dr. Mare Ferrari.  She has a history of known residual right carotid bruit after remote right carotid endarterectomy.   She had a normal nuclear stress test in 12-10-03. She has had essential hypertension and obesity.    Her thyroid disease is followed by her GYN physician. She follows with vascular surgery regarding her carotid disease.   Chronic GERD sx.   In July 2020, stress test: Nuclear stress EF: 77%. The left ventricular ejection fraction is hyperdynamic (>65%). There was no ST segment deviation noted during stress. Defect 1: There is a small defect of mild severity present in the basal anterior, mid anterior and apical anterior location. This appears to be most consistent with breast attenuation This is a low risk study. There is no evidence of ischemia or previous infarction. The study is normal.   In July 2020, she  Had some dizziness with turning:"had an episode of vertigo Monday assoc with N/V and elevated BP. EMS ran an EKG which was normal. High salt diet the night before. Under a lot of stress with her husband in hospice care.  Patient would like to proceed with echo and stress test to rule out cardiac source. Increase HCTZ to 25 mg 1 1/2 daily.F/U with Dr. Irish Lack after testing."  BP was high at that time.     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 Nolene Bernheim, who had metastatic prostate cancer.  He passed away in 12/10/2018.     She had her COVID vaccines.   She has taken a steroid for collagenous colitis. When she tapered off, it flares  up.     In the past, she felt some dizziness, unsteadiness.  Worse after taking her BP meds. She had some palpitations, and they were isolated premature beats.  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  She repeats these exercise several times a week.  No cardiovascular sx.    Doing PT for back.  Now with right hip pain.  Injections with Dr. Durward Fortes. These are giving relief.  Trying to avoid hip replacement.   Denies : Chest pain. Dizziness. Leg edema. Nitroglycerin use. Orthopnea. Palpitations. Paroxysmal nocturnal dyspnea. Shortness of breath. Syncope.    She has had a persistent cough, for 11 months. Tried allergy meds.  Did not change the cough.  She saw an ENT and Dr. Benjamine Mola started Atrovent. She still has a cough. Has some whitish phlegm.     Past Medical History:  Diagnosis Date   Arthritis    Back pain    had cortisone injection 6/15, Dr Durward Fortes   Carotid artery occlusion    Carotid bruit    Colitis, collagenous    Diverticulosis    GERD (gastroesophageal reflux disease)    Hyperlipidemia    Hypertension    Injury of left leg 06/05/12   Pt. fell   Peripheral vascular disease ([place])    Shingles outbreak 2015-12-10  Thyroid disease 02-27-12   lt. thyroid nodule, bx. done 5 yrs ago-now some enlargement is seen.    Past Surgical History:  Procedure Laterality Date   ABDOMINAL HYSTERECTOMY  01/1980   CAROTID ENDARTERECTOMY  1998   Stable since surgery -slight build up returning-follows with yrly Dopplers   CATARACT EXTRACTION, BILATERAL  2004 or 2005 per pt   COLONOSCOPY     EYE SURGERY  02-27-12   Bil. Cataract surgery   THYROID LOBECTOMY  03/02/2012   Procedure: THYROID LOBECTOMY;  Surgeon: Earnstine Regal, MD;  Location: [facility];  Service: General;  Laterality: Left;     Current Outpatient Medications  Medication Sig Dispense Refill   acetaminophen (TYLENOL) 500 MG tablet Take 1,000 mg by mouth 2 (two) times daily.     amLODipine (NORVASC) 2.5 MG tablet TAKE 1  TABLET BY MOUTH DAILY WITH BREAKFAST 90 tablet 3   aspirin 81 MG EC tablet Take 81 mg by mouth daily.     B Complex Vitamins (VITAMIN-B COMPLEX PO) Take 1 tablet by mouth daily with breakfast.     Cholecalciferol (VITAMIN D) 50 MCG (2000 UT) tablet Take 4,000 Units by mouth daily.     furosemide (LASIX) 40 MG tablet Take 1 tablet (40 mg total) by mouth daily as needed for edema. 30 tablet 0   GEMTESA 75 MG TABS TAKE 1 TABLET BY MOUTH DAILY 30 tablet 2   hydrochlorothiazide (HYDRODIURIL) 25 MG tablet Take 25 mg by mouth daily.     KRILL OIL PO Take 500 mg by mouth daily. MEGA RED     levothyroxine (SYNTHROID) 112 MCG tablet Take 1 tablet (112 mcg total) by mouth daily. 90 tablet 3   melatonin 5 MG TABS Take 10 mg by mouth at bedtime.      metoprolol succinate (TOPROL-XL) 100 MG 24 hr tablet TAKE 1 TABLET BY MOUTH DAILY WITH BREAKFAST OR IMMEDIATELY FOLLOWING A MEAL 90 tablet 3   mometasone (ELOCON) 0.1 % cream Apply 1 application topically daily. 45 g 1   Multiple Vitamin (MULTIVITAMIN) tablet Take 1 tablet by mouth daily.       mupirocin ointment (BACTROBAN) 2 % SMARTSIG:1 Application Topical 2-3 Times Daily     rosuvastatin (CRESTOR) 20 MG tablet Take 1 tablet (20 mg total) by mouth daily. Pt needs to keep upcoming appt in Nov for further refills 90 tablet 0   telmisartan (MICARDIS) 80 MG tablet TAKE 1 TABLET BY MOUTH DAILY 90 tablet 3   verapamil (CALAN-SR) 240 MG CR tablet TAKE 1 TABLET BY MOUTH DAILY WITH BREAKFAST 90 tablet 3   Bacillus Coagulans-Inulin (ALIGN PREBIOTIC-PROBIOTIC PO) Take 1 capsule by mouth at bedtime. (Patient not taking: Reported on 08/13/2021)     budesonide (ENTOCORT EC) 3 MG 24 hr capsule Take 3 mg by mouth every other day. (Patient not taking: Reported on 08/13/2021)     No current facility-administered medications for this visit.    Allergies:   Zebeta and Ace inhibitors    Social History:  The patient  reports that she quit smoking about 45 years ago. Her  smoking use included cigarettes. She has never used smokeless tobacco. She reports current alcohol use of about 1.0 standard drink per week. She reports that she does not use drugs.   Family History:  The patient's family history includes Arthritis in her mother; Atrial fibrillation in her mother; Heart disease in her brother and mother; Hyperlipidemia in her mother; Hypertension in her mother; Stroke in her mother.  ROS:  Please see the history of present illness.   Otherwise, review of systems are positive for cough.   All other systems are reviewed and negative.    PHYSICAL EXAM: VS:  BP 138/60   Pulse 82   Ht 5\' 2"  (1.575 m)   Wt 185 lb (83.9 kg)   LMP 09/24/1979   SpO2 94%   BMI 33.84 kg/m  , BMI Body mass index is 33.84 kg/m. GEN: Well nourished, well developed, in no acute distress HEENT: normal Neck: no JVD, carotid bruits, or masses Cardiac: RRR; no murmurs, rubs, or gallops,no edema  Respiratory:  clear to auscultation bilaterally, normal work of breathing GI: soft, nontender, nondistended, + BS MS: no deformity or atrophy Skin: warm and dry, no rash Neuro:  Strength and sensation are intact Psych: euthymic mood, full affect   EKG:   The ekg ordered 11/2020 demonstrates NSR, no ST changes   Recent Labs: No results found for requested labs within last 8760 hours.   Lipid Panel    Component Value Date/Time   CHOL 172 06/15/2020 1210   TRIG 131 06/15/2020 1210   HDL 50 06/15/2020 1210   CHOLHDL 3.4 06/15/2020 1210   CHOLHDL 3.9 08/26/2016 1035   VLDL 32 (H) 08/26/2016 1035   [place] 99 06/15/2020 1210   LDLDIRECT 73.4 01/07/2013 0939     Other studies Reviewed: Additional studies/ records that were reviewed today with results demonstrating: labs reviewed.   ASSESSMENT AND PLAN:  Hypertensive heart disease: The current medical regimen is effective;  continue present plan and medications. Persistent cough: plan for CXR.  No relief with allergy  treatments.  No sign of CHF on exam.  Hyperlipidemia: LDL 111 in 01/2021.  Continue rosuvastatin.  Staying away from salt.  Lots of fiber intake as well.  Whole food, plant based diet.  CArotid artery disease: stable. Followed with [facility].  Hypothyroid: TSH 1.55 in 01/2021 Bruising: Better after steroids were stopped.    Current medicines are reviewed at length with the patient today.  The patient concerns regarding her medicines were addressed.  The following changes have been made:  No change  Labs/ tests ordered today include:  No orders of the defined types were placed in this encounter.   Recommend 150 minutes/week of aerobic exercise Low fat, low carb, high fiber diet recommended  Disposition:   FU in 8 months   Signed, Larae Grooms, MD  08/13/2021 11:34 AM    [facility] [place], [place], [place]  [address] Phone: [phone]; Fax: [phone]

## 2021-08-13 ENCOUNTER — Ambulatory Visit: Payer: Medicare Other | Admitting: Interventional Cardiology

## 2021-08-13 ENCOUNTER — Other Ambulatory Visit: Payer: Self-pay

## 2021-08-13 ENCOUNTER — Encounter: Payer: Self-pay | Admitting: Interventional Cardiology

## 2021-08-13 VITALS — BP 138/60 | HR 82 | Ht 62.0 in | Wt 185.0 lb

## 2021-08-13 DIAGNOSIS — I6523 Occlusion and stenosis of bilateral carotid arteries: Secondary | ICD-10-CM

## 2021-08-13 DIAGNOSIS — R059 Cough, unspecified: Secondary | ICD-10-CM | POA: Diagnosis not present

## 2021-08-13 DIAGNOSIS — E785 Hyperlipidemia, unspecified: Secondary | ICD-10-CM | POA: Diagnosis not present

## 2021-08-13 DIAGNOSIS — I119 Hypertensive heart disease without heart failure: Secondary | ICD-10-CM | POA: Diagnosis not present

## 2021-08-13 MED ORDER — METOPROLOL SUCCINATE ER 100 MG PO TB24: ORAL_TABLET | ORAL | 3 refills | Status: DC

## 2021-08-13 MED ORDER — FUROSEMIDE 40 MG PO TABS: 40.0000 mg | ORAL_TABLET | Freq: Every day | ORAL | 6 refills | Status: DC | PRN

## 2021-08-13 MED ORDER — ROSUVASTATIN CALCIUM 20 MG PO TABS: 20.0000 mg | ORAL_TABLET | Freq: Every day | ORAL | 0 refills | Status: DC

## 2021-08-13 NOTE — Patient Instructions (Addendum)
Medication Instructions:  Your physician recommends that you continue on your current medications as directed. Please refer to the Current Medication list given to you today.  *If you need a refill on your cardiac medications before your next appointment, please call your pharmacy*   Lab Work: none If you have labs (blood work) drawn today and your tests are completely normal, you will receive your results only by: [place] (if you have MyChart) OR A paper copy in the mail If you have any lab test that is abnormal or we need to change your treatment, we will call you to review the results.   Testing/Procedures: Have Chest X ray done at [place]: At [facility], you and your health needs are our priority.  As part of our continuing mission to provide you with exceptional heart care, we have created designated Provider Care Teams.  These Care Teams include your primary Cardiologist (physician) and Advanced Practice Providers (APPs -  Physician Assistants and Nurse Practitioners) who all work together to provide you with the care you need, when you need it.  We recommend signing up for the patient portal called "MyChart".  Sign up information is provided on this After Visit Summary.  MyChart is used to connect with patients for Virtual Visits (Telemedicine).  Patients are able to view lab/test results, encounter notes, upcoming appointments, etc.  Non-urgent messages can be sent to your provider as well.   To learn more about what you can do with MyChart, go to [email or web address].    Your next appointment:   7 month(s)--June or July 2023  The format for your next appointment:   In Person  Provider:   Larae Grooms, MD     Other Instructions

## 2021-08-14 ENCOUNTER — Ambulatory Visit
Admission: RE | Admit: 2021-08-14 | Discharge: 2021-08-14 | Disposition: A | Discharge status: Home / Self Care | Payer: Medicare Other | Source: Ambulatory Visit | Attending: Interventional Cardiology | Admitting: Interventional Cardiology

## 2021-08-14 ENCOUNTER — Encounter: Payer: Self-pay | Admitting: Physical Therapy

## 2021-08-14 ENCOUNTER — Ambulatory Visit: Payer: Medicare Other | Admitting: Physical Therapy

## 2021-08-14 DIAGNOSIS — R262 Difficulty in walking, not elsewhere classified: Secondary | ICD-10-CM

## 2021-08-14 DIAGNOSIS — M5441 Lumbago with sciatica, right side: Secondary | ICD-10-CM | POA: Diagnosis not present

## 2021-08-14 DIAGNOSIS — G8929 Other chronic pain: Secondary | ICD-10-CM | POA: Diagnosis not present

## 2021-08-14 DIAGNOSIS — M6281 Muscle weakness (generalized): Secondary | ICD-10-CM | POA: Diagnosis not present

## 2021-08-14 DIAGNOSIS — R059 Cough, unspecified: Secondary | ICD-10-CM

## 2021-08-14 NOTE — Therapy (Signed)
[facility] [address] [place], [place], [address] Phone: [phone]   Fax:  [phone]  Physical Therapy Treatment  Patient Details  Name: [name] MRN: [id number] Date of Birth: August 03, 1941 Referring Provider (PT): Joni Fears MD   Encounter Date: 08/14/2021   PT End of Session - 08/14/21 1105     Visit Number 10    Number of Visits 12    Date for PT Re-Evaluation 08/24/21    Authorization Type [employer] medicare $30    Progress Note Due on Visit 10    PT Start Time 1102    PT Stop Time 1140    PT Time Calculation (min) 38 min    Activity Tolerance Patient tolerated treatment well    Behavior During Therapy [facility] for tasks assessed/performed             Past Medical History:  Diagnosis Date   Arthritis    Back pain    had cortisone injection 6/15, Dr Durward Fortes   Carotid artery occlusion    Carotid bruit    Colitis, collagenous    Diverticulosis    GERD (gastroesophageal reflux disease)    Hyperlipidemia    Hypertension    Injury of left leg 06/05/12   Pt. fell   Peripheral vascular disease ([place])    Shingles outbreak 2017   Thyroid disease 02-27-12   lt. thyroid nodule, bx. done 5 yrs ago-now some enlargement is seen.    Past Surgical History:  Procedure Laterality Date   ABDOMINAL HYSTERECTOMY  01/1980   CAROTID ENDARTERECTOMY  1998   Stable since surgery -slight build up returning-follows with yrly Dopplers   CATARACT EXTRACTION, BILATERAL  2004 or 2005 per pt   COLONOSCOPY     EYE SURGERY  02-27-12   Bil. Cataract surgery   THYROID LOBECTOMY  03/02/2012   Procedure: THYROID LOBECTOMY;  Surgeon: Earnstine Regal, MD;  Location: [facility];  Service: General;  Laterality: Left;    There were no vitals filed for this visit.   Subjective Assessment - 08/14/21 1102     Subjective Pt arriiving today reporting no pain at rest. Pt still stating she has mild soreness in left hip and when walking she has to stop at times.     Pertinent History arthritis, back pain, GERD, HTN, PVD, throid disease, thyroid disease, carotid artery occlusion, hyperlipidemia,    Limitations Walking;House hold activities;Standing    Diagnostic tests MRI: grade 1 anterolisthesis of L4-5, severe facet arthrosis with right lateral recess stenosis L4-5, L3-4 lateral recess stenosis.    Patient Stated Goals Get back to walking 1 mile.    Currently in Pain? No/denies                               [facility] Adult PT Treatment/Exercise - 08/14/21 0001       Lumbar Exercises: Aerobic   Nustep L5 x 6 minutes      Lumbar Exercises: Machines for Strengthening   Leg Press 81 lbs 3 x 10 double leg      Lumbar Exercises: Standing   Heel Raises 15 reps;3 seconds    Row Strengthening;Both;15 reps    Other Standing Lumbar Exercises hip hike in door way x 10 - 5 sec hold c cues throughout to improve technique    Other Standing Lumbar Exercises lateral step down focus 6 inch 2 x 10 bilateral, hip abdcution x 15 each LE with  UE support                       PT Short Term Goals - 08/14/21 1142       PT SHORT TERM GOAL #1   Status Achieved      PT SHORT TERM GOAL #2   Title Pt will improve 5 time sit to stand to </= 14 seconds with no UE support.    Status Achieved               PT Long Term Goals - 08/06/21 1218       PT LONG TERM GOAL #1   Title Pt will be independent in her advanced HEP.    Status On-going      PT LONG TERM GOAL #2   Title Pt will increase her bilateral hip strength to 5/5 in order to improve funtional mobility.    Status On-going      PT LONG TERM GOAL #3   Title Pt will be able to report pain of </= 2/10 with household chores.    Status Achieved      PT LONG TERM GOAL #4   Title Pt will be able to amb 1/2 mile with pain </= 2/10.    Baseline Pt reporting amb in grocery store using a cart to push she is able to walk 20 minutes without a problem, but is unsure if she would be  able to walk with support for that long.    Status --      PT LONG TERM GOAL #5   Title Pt will improve her FOTO to >/= 60 %    Baseline 57% on 08/09/2021    Status Partially Met                   Plan - 08/14/21 1136     Clinical Impression Statement Pt reporting no pain in her low back. Pt stating she feels like her hip is more of a problem than her low back. Pt has improved her LE strength still progressing with right hip abduction. Pt has progress toward her LTG's. We discussed discharge at her next visit to home based program.    Personal Factors and Comorbidities Comorbidity 3+    Comorbidities MRI: grade 1 anterolisthesis of L4-5, severe facet arthrosis with right lateral recess stenosis L4-5, L3-4 lateral recess stenosis.arthritis, back pain, GERD, HTN, PVD, throid disease, thyroid disease, carotid artery occlusion, hyperlipidemia,    Examination-Participation Restrictions Other;Community Activity    Stability/Clinical Decision Making Stable/Uncomplicated    Rehab Potential Good    PT Frequency 2x / week    PT Duration 6 weeks    PT Treatment/Interventions ADLs/Self Care Home Management;Electrical Stimulation;Cryotherapy;Iontophoresis 4mg /ml Dexamethasone;Moist Heat;Traction;Ultrasound;Gait training;Stair training;Functional mobility training;Therapeutic activities;Therapeutic exercise;Balance training;Neuromuscular re-education;Cognitive remediation;Patient/family education;Manual techniques;Dry needling;Taping;Passive range of motion;Spinal Manipulations;Joint Manipulations    PT Next Visit Plan Update HEP and discharge at next visit    PT Home Exercise Plan Access Code: [email or web address]  URL: [email or web address]  Date: 07/10/2021  Prepared by: Kearney Hard    Exercises  Supine Bridge - 2 x daily - 7 x weekly - 2 sets - 10 reps - 5 seconds hold  Supine Lower Trunk Rotation - 2 x daily - 7 x weekly - 5 reps - 20 seconds hold  Hooklying Single Knee to Chest Stretch -  2 x daily - 7 x weekly - 5 reps - 20 seconds hold  Supine Figure 4 Piriformis Stretch -  2 x daily - 7 x weekly - 5 reps - 20 seconds hold  Supine Active Straight Leg Raise - 2 x daily - 7 x weekly - 2 sets - 10 reps, added supine clams green X20, standing hip abd green x10, and standing hip extensions green X10    Consulted and Agree with Plan of Care Patient             Patient will benefit from skilled therapeutic intervention in order to improve the following deficits and impairments:  Pain, Postural dysfunction, Decreased strength, Decreased balance, Impaired flexibility, Decreased activity tolerance, Difficulty walking, Decreased range of motion, Decreased mobility  Visit Diagnosis: Chronic right-sided low back pain with right-sided sciatica  Muscle weakness (generalized)  Difficulty in walking, not elsewhere classified     Problem List Patient Active Problem List   Diagnosis Date Noted   Allergic rhinitis 01/11/2021   Chronic kidney disease due to hypertension 01/11/2021   Chronic kidney disease, stage 3a ([place]) 01/11/2021   Collagenous colitis 01/11/2021   Hardening of the aorta (main artery of the heart) ([place]) 01/11/2021   Hyperlipidemia 01/11/2021   Occlusion and stenosis of bilateral carotid arteries 01/11/2021   Primary localized osteoarthritis of pelvic region and thigh 01/11/2021   Urinary incontinence 01/11/2021   Unilateral primary osteoarthritis, right hip 05/02/2020   Low back pain 05/02/2020   Diarrhea 07/26/2019   Gaseous regurgitation- burping 07/26/2019   Acute nonintractable headache 07/26/2019   Other fatigue 07/26/2019   Irritable bowel syndrome type symptoms at this time with diarrhea 07/26/2019   Stress and adjustment reaction- loss of husband july 2020; settling estate now etc 07/26/2019   Vertigo 03/24/2019   Glucose intolerance (impaired glucose tolerance) 02/17/2019   Other specified hypothyroidism 02/17/2019   Caregiver stress 02/17/2019    Caregiver stress syndrome 07/27/2018   Vitamin D deficiency 09/30/2016   Obesity, Class I, BMI 30-34.9 09/30/2016   Right carotid bruit 06/07/2016   h/o Diverticulosis 06/07/2016   GERD (gastroesophageal reflux disease) 06/07/2016   Environmental and seasonal allergies 06/07/2016   Arthritis 06/07/2016   Moderate osteopenia 06/07/2016   Bilateral carotid artery disease ([place]) 06/10/2012   Hypothyroidism, postsurgical 05/05/2012   Dyslipidemia- low HDL and inc VLDL & TG 04/04/2011   Benign hypertensive heart disease without heart failure 04/04/2011    Oretha Caprice, PT,MPT 08/14/2021, 11:44 AM  [facility] [address] [place], [place], [address] Phone: [phone]   Fax:  [phone]  Name: TIFFANY CALMES MRN: [id number] Date of Birth: October 02, 1940

## 2021-08-14 NOTE — Progress Notes (Signed)
The patient has been notified of the result and verbalized understanding.  All questions (if any) were answered. Leodis Liverpool, RN 08/14/2021 4:21 PM

## 2021-08-21 ENCOUNTER — Other Ambulatory Visit: Payer: Self-pay

## 2021-08-21 ENCOUNTER — Encounter: Payer: Self-pay | Admitting: Physical Therapy

## 2021-08-21 ENCOUNTER — Ambulatory Visit: Payer: Medicare Other | Admitting: Physical Therapy

## 2021-08-21 DIAGNOSIS — M6281 Muscle weakness (generalized): Secondary | ICD-10-CM

## 2021-08-21 DIAGNOSIS — R262 Difficulty in walking, not elsewhere classified: Secondary | ICD-10-CM

## 2021-08-21 DIAGNOSIS — G8929 Other chronic pain: Secondary | ICD-10-CM

## 2021-08-21 DIAGNOSIS — M5441 Lumbago with sciatica, right side: Secondary | ICD-10-CM

## 2021-08-21 NOTE — Therapy (Signed)
[facility] Physical Therapy [address] [place], [place], [address] Phone: [phone]   Fax:  [phone]  Physical Therapy Treatment Discharge   Patient Details  Name: [name] MRN: [id number] Date of Birth: August 17, 1941 Referring Provider (PT): Joni Fears MD   Encounter Date: 08/21/2021   PT End of Session - 08/21/21 1147     Visit Number 11    Number of Visits 12    Date for PT Re-Evaluation 08/24/21    Authorization Type [employer] medicare $30    PT Start Time 1103    PT Stop Time 1141    PT Time Calculation (min) 38 min    Activity Tolerance Patient tolerated treatment well    Behavior During Therapy [facility] for tasks assessed/performed             Past Medical History:  Diagnosis Date   Arthritis    Back pain    had cortisone injection 6/15, Dr Durward Fortes   Carotid artery occlusion    Carotid bruit    Colitis, collagenous    Diverticulosis    GERD (gastroesophageal reflux disease)    Hyperlipidemia    Hypertension    Injury of left leg 06/05/12   Pt. fell   Peripheral vascular disease ([place])    Shingles outbreak 2017   Thyroid disease 02-27-12   lt. thyroid nodule, bx. done 5 yrs ago-now some enlargement is seen.    Past Surgical History:  Procedure Laterality Date   ABDOMINAL HYSTERECTOMY  01/1980   CAROTID ENDARTERECTOMY  1998   Stable since surgery -slight build up returning-follows with yrly Dopplers   CATARACT EXTRACTION, BILATERAL  2004 or 2005 per pt   COLONOSCOPY     EYE SURGERY  02-27-12   Bil. Cataract surgery   THYROID LOBECTOMY  03/02/2012   Procedure: THYROID LOBECTOMY;  Surgeon: Earnstine Regal, MD;  Location: [facility];  Service: General;  Laterality: Left;    There were no vitals filed for this visit.   Subjective Assessment - 08/21/21 1103     Subjective Pt arriving today reporting 2/10 pain in her right hip today. Pt feeling better overall since starting therapy.    Pertinent History arthritis, back pain, GERD,  HTN, PVD, throid disease, thyroid disease, carotid artery occlusion, hyperlipidemia,    Limitations Walking;House hold activities;Standing    Diagnostic tests MRI: grade 1 anterolisthesis of L4-5, severe facet arthrosis with right lateral recess stenosis L4-5, L3-4 lateral recess stenosis.    Patient Stated Goals Get back to walking 1 mile.    Currently in Pain? Yes    Pain Score 2     Pain Location Hip    Pain Orientation Right    Pain Descriptors / Indicators Aching    Pain Type Chronic pain    Pain Onset More than a month ago                [facility] PT Assessment - 08/21/21 0001       Assessment   Medical Diagnosis M54.41 chronic right sided low back pain    Referring Provider (PT) Joni Fears MD      Observation/Other Assessments   Focus on Therapeutic Outcomes (FOTO)  updated 72.4 %      Strength   Right Hip Flexion 5/5    Right Hip ABduction 4+/5    Right Hip ADduction 5/5    Left Hip Flexion 5/5    Left Hip ABduction 5/5    Left Hip ADduction 5/5  Transfers   Five time sit to stand comments  10 seconds with no UE support                           [facility] Adult PT Treatment/Exercise - 08/21/21 0001       Lumbar Exercises: Aerobic   Nustep L5 x 6 minutes      Lumbar Exercises: Standing   Heel Raises 15 reps;3 seconds    Other Standing Lumbar Exercises hip hike in door way x 10 - 5 sec hold c cues throughout to improve technique    Other Standing Lumbar Exercises step ups, hip abd, hip extension all x 10                       PT Short Term Goals - 08/14/21 1142       PT SHORT TERM GOAL #1   Status Achieved      PT SHORT TERM GOAL #2   Title Pt will improve 5 time sit to stand to </= 14 seconds with no UE support.    Status Achieved               PT Long Term Goals - 08/21/21 1143       PT LONG TERM GOAL #1   Title Pt will be independent in her advanced HEP.    Status Achieved      PT LONG TERM GOAL #2    Title Pt will increase her bilateral hip strength to 5/5 in order to improve funtional mobility.    Baseline hip abduction 4+/5 in right hip    Status Partially Met      PT LONG TERM GOAL #3   Title Pt will be able to report pain of </= 2/10 with household chores.    Status Achieved      PT LONG TERM GOAL #4   Title Pt will be able to amb 1/2 mile with pain </= 2/10.    Status Achieved      PT LONG TERM GOAL #5   Title Pt will improve her FOTO to >/= 60 %    Baseline 72% on 08/21/2021    Status Achieved                   Plan - 08/21/21 1108     Clinical Impression Statement Pt's has made great progress with therapy. Pt is still progressing toward improved right hip abduction strength. Pt 's HEP was updated and pt able to return demonstration on all exericses with handout given. 4/5 TLG's met. Pt pleased with therapy outcome. Pt is being discharged from skilled PT services and will continue her home program using a personal trainer at her gym.    Personal Factors and Comorbidities Comorbidity 3+    Comorbidities MRI: grade 1 anterolisthesis of L4-5, severe facet arthrosis with right lateral recess stenosis L4-5, L3-4 lateral recess stenosis.arthritis, back pain, GERD, HTN, PVD, throid disease, thyroid disease, carotid artery occlusion, hyperlipidemia,    Examination-Participation Restrictions Other;Community Activity    Stability/Clinical Decision Making Stable/Uncomplicated    PT Frequency 2x / week    PT Duration 6 weeks    PT Treatment/Interventions ADLs/Self Care Home Management;Electrical Stimulation;Cryotherapy;Iontophoresis 44m/ml Dexamethasone;Moist Heat;Traction;Ultrasound;Gait training;Stair training;Functional mobility training;Therapeutic activities;Therapeutic exercise;Balance training;Neuromuscular re-education;Cognitive remediation;Patient/family education;Manual techniques;Dry needling;Taping;Passive range of motion;Spinal Manipulations;Joint Manipulations     PT Next Visit Plan discharged pt on 08/21/2021    PT Home Exercise  Plan Access Code: [email or web address]  URL: [email or web address]  Date: 07/10/2021  Prepared by: Kearney Hard    Exercises  Supine Bridge - 2 x daily - 7 x weekly - 2 sets - 10 reps - 5 seconds hold  Supine Lower Trunk Rotation - 2 x daily - 7 x weekly - 5 reps - 20 seconds hold  Hooklying Single Knee to Chest Stretch - 2 x daily - 7 x weekly - 5 reps - 20 seconds hold  Supine Figure 4 Piriformis Stretch - 2 x daily - 7 x weekly - 5 reps - 20 seconds hold  Supine Active Straight Leg Raise - 2 x daily - 7 x weekly - 2 sets - 10 reps, added supine clams green X20, standing hip abd green x10, and standing hip extensions green X10    Consulted and Agree with Plan of Care Patient             Patient will benefit from skilled therapeutic intervention in order to improve the following deficits and impairments:  Pain, Postural dysfunction, Decreased strength, Decreased balance, Impaired flexibility, Decreased activity tolerance, Difficulty walking, Decreased range of motion, Decreased mobility  Visit Diagnosis: Chronic right-sided low back pain with right-sided sciatica  Muscle weakness (generalized)  Difficulty in walking, not elsewhere classified     Problem List Patient Active Problem List   Diagnosis Date Noted   Allergic rhinitis 01/11/2021   Chronic kidney disease due to hypertension 01/11/2021   Chronic kidney disease, stage 3a ([place]) 01/11/2021   Collagenous colitis 01/11/2021   Hardening of the aorta (main artery of the heart) ([place]) 01/11/2021   Hyperlipidemia 01/11/2021   Occlusion and stenosis of bilateral carotid arteries 01/11/2021   Primary localized osteoarthritis of pelvic region and thigh 01/11/2021   Urinary incontinence 01/11/2021   Unilateral primary osteoarthritis, right hip 05/02/2020   Low back pain 05/02/2020   Diarrhea 07/26/2019   Gaseous regurgitation- burping 07/26/2019   Acute  nonintractable headache 07/26/2019   Other fatigue 07/26/2019   Irritable bowel syndrome type symptoms at this time with diarrhea 07/26/2019   Stress and adjustment reaction- loss of husband july 2020; settling estate now etc 07/26/2019   Vertigo 03/24/2019   Glucose intolerance (impaired glucose tolerance) 02/17/2019   Other specified hypothyroidism 02/17/2019   Caregiver stress 02/17/2019   Caregiver stress syndrome 07/27/2018   Vitamin D deficiency 09/30/2016   Obesity, Class I, BMI 30-34.9 09/30/2016   Right carotid bruit 06/07/2016   h/o Diverticulosis 06/07/2016   GERD (gastroesophageal reflux disease) 06/07/2016   Environmental and seasonal allergies 06/07/2016   Arthritis 06/07/2016   Moderate osteopenia 06/07/2016   Bilateral carotid artery disease ([place]) 06/10/2012   Hypothyroidism, postsurgical 05/05/2012   Dyslipidemia- low HDL and inc VLDL & TG 04/04/2011   Benign hypertensive heart disease without heart failure 04/04/2011   PHYSICAL THERAPY DISCHARGE SUMMARY  Visits from Start of Care: 11  Current functional level related to goals / functional outcomes: See above   Remaining deficits: Hip abd strength 4+/5   Education / Equipment: HEP   Patient agrees to discharge. Patient goals were partially met. Patient is being discharged due to being pleased with the current functional level.  Oretha Caprice, PT, MPT 08/21/2021, 11:48 AM  [facility] Physical Therapy [address] [place], [place], [address] Phone: [phone]   Fax:  [phone]  Name: [name] MRN: [id number] Date of Birth: 05/24/1941

## 2021-08-29 ENCOUNTER — Other Ambulatory Visit: Payer: Self-pay | Admitting: Interventional Cardiology

## 2021-09-18 ENCOUNTER — Other Ambulatory Visit (HOSPITAL_BASED_OUTPATIENT_CLINIC_OR_DEPARTMENT_OTHER): Payer: Self-pay | Admitting: Obstetrics & Gynecology

## 2021-09-18 DIAGNOSIS — N3281 Overactive bladder: Secondary | ICD-10-CM

## 2021-10-15 ENCOUNTER — Ambulatory Visit (HOSPITAL_COMMUNITY)
Admission: RE | Admit: 2021-10-15 | Discharge: 2021-10-15 | Disposition: A | Discharge status: Home / Self Care | Payer: Medicare Other | Source: Ambulatory Visit | Attending: Surgery | Admitting: Surgery

## 2021-10-15 ENCOUNTER — Other Ambulatory Visit: Payer: Self-pay

## 2021-10-15 ENCOUNTER — Ambulatory Visit: Payer: Medicare Other | Admitting: Physician Assistant

## 2021-10-15 VITALS — BP 133/63 | HR 67 | Temp 97.2°F | Resp 17 | Ht 62.0 in | Wt 184.0 lb

## 2021-10-15 DIAGNOSIS — I6523 Occlusion and stenosis of bilateral carotid arteries: Secondary | ICD-10-CM

## 2021-10-15 DIAGNOSIS — Z0000 Encounter for general adult medical examination without abnormal findings: Secondary | ICD-10-CM | POA: Insufficient documentation

## 2021-10-15 NOTE — Progress Notes (Signed)
Office Note     CC:  follow up Requesting Provider:  Michael Boston, MD  HPI: [name] is a 81 y.o. ([birth year]) female who presents for surveillance of carotid artery stenosis.  She underwent right carotid endarterectomy by Dr. Amedeo Plenty in 1998.  She was last seen in office 6 months ago and denies any diagnosis of CVA or TIA since last office visit.  She also denies any current strokelike symptoms including slurring speech, changes in vision, or one-sided weakness.  She is followed regularly by her PCP for management of chronic medical conditions including hyperlipidemia and hypertension.  She is also followed by a cardiologist regularly.  She states she had an ischemic cardiac work-up in July 2020 which was negative.  She denies shortness of breath or chest pain with exertion.   Past Medical History:  Diagnosis Date   Arthritis    Back pain    had cortisone injection 6/15, Dr Durward Fortes   Carotid artery occlusion    Carotid bruit    Colitis, collagenous    Diverticulosis    GERD (gastroesophageal reflux disease)    Hyperlipidemia    Hypertension    Injury of left leg 06/05/12   Pt. fell   Peripheral vascular disease ([place])    Shingles outbreak 2017   Thyroid disease 02-27-12   lt. thyroid nodule, bx. done 5 yrs ago-now some enlargement is seen.    Past Surgical History:  Procedure Laterality Date   ABDOMINAL HYSTERECTOMY  01/1980   CAROTID ENDARTERECTOMY  1998   Stable since surgery -slight build up returning-follows with yrly Dopplers   CATARACT EXTRACTION, BILATERAL  2004 or 2005 per pt   COLONOSCOPY     EYE SURGERY  02-27-12   Bil. Cataract surgery   THYROID LOBECTOMY  03/02/2012   Procedure: THYROID LOBECTOMY;  Surgeon: Earnstine Regal, MD;  Location: [facility];  Service: General;  Laterality: Left;    Social History   Socioeconomic History   Marital status: Widowed    Spouse name: Not on file   Number of children: Not on file   Years of education: Not on file   Highest  education level: Not on file  Occupational History   Not on file  Tobacco Use   Smoking status: Former    Types: Cigarettes    Quit date: 09/24/1975    Years since quitting: 46.0   Smokeless tobacco: Never  Vaping Use   Vaping Use: Never used  Substance and Sexual Activity   Alcohol use: Yes    Alcohol/week: 1.0 standard drink    Types: 1 Glasses of wine per week   Drug use: No   Sexual activity: Not Currently    Partners: Male    Birth control/protection: Surgical    Comment: hysterectomy  Other Topics Concern   Not on file  Social History Narrative   Not on file   Social Determinants of Health   Financial Resource Strain: Not on file  Food Insecurity: Not on file  Transportation Needs: Not on file  Physical Activity: Not on file  Stress: Not on file  Social Connections: Not on file  Intimate Partner Violence: Not on file    Family History  Problem Relation Age of Onset   Heart disease Mother    Hypertension Mother    Arthritis Mother    Stroke Mother    Hyperlipidemia Mother    Atrial fibrillation Mother    Heart disease Brother  before age 26   Heart attack Neg Hx     Current Outpatient Medications  Medication Sig Dispense Refill   acetaminophen (TYLENOL) 500 MG tablet Take 1,000 mg by mouth 2 (two) times daily.     amLODipine (NORVASC) 2.5 MG tablet TAKE 1 TABLET BY MOUTH DAILY WITH BREAKFAST 90 tablet 3   aspirin 81 MG EC tablet Take 81 mg by mouth daily.     B Complex Vitamins (VITAMIN-B COMPLEX PO) Take 1 tablet by mouth daily with breakfast.     Cholecalciferol (VITAMIN D) 50 MCG (2000 UT) tablet Take 4,000 Units by mouth daily.     furosemide (LASIX) 40 MG tablet Take 1 tablet (40 mg total) by mouth daily as needed for edema. 30 tablet 6   GEMTESA 75 MG TABS TAKE 1 TABLET BY MOUTH DAILY 30 tablet 2   hydrochlorothiazide (HYDRODIURIL) 25 MG tablet Take 1 tablet (25 mg total) by mouth daily. 90 tablet 2   KRILL OIL PO Take 500 mg by mouth daily.  MEGA RED     levothyroxine (SYNTHROID) 112 MCG tablet Take 1 tablet (112 mcg total) by mouth daily. 90 tablet 3   melatonin 5 MG TABS Take 10 mg by mouth at bedtime.      mometasone (ELOCON) 0.1 % cream Apply 1 application topically daily. 45 g 1   Multiple Vitamin (MULTIVITAMIN) tablet Take 1 tablet by mouth daily.       mupirocin ointment (BACTROBAN) 2 % SMARTSIG:1 Application Topical 2-3 Times Daily     rosuvastatin (CRESTOR) 20 MG tablet Take 1 tablet (20 mg total) by mouth daily. 90 tablet 0   telmisartan (MICARDIS) 80 MG tablet TAKE 1 TABLET BY MOUTH DAILY 90 tablet 3   verapamil (CALAN-SR) 240 MG CR tablet TAKE 1 TABLET BY MOUTH DAILY WITH BREAKFAST 90 tablet 3   Bacillus Coagulans-Inulin (ALIGN PREBIOTIC-PROBIOTIC PO) Take 1 capsule by mouth at bedtime. (Patient not taking: Reported on 08/13/2021)     budesonide (ENTOCORT EC) 3 MG 24 hr capsule Take 3 mg by mouth every other day. (Patient not taking: Reported on 08/13/2021)     metoprolol succinate (TOPROL-XL) 100 MG 24 hr tablet Take with or immediately following a meal. (Patient not taking: Reported on 10/15/2021) 90 tablet 3   No current facility-administered medications for this visit.    Allergies  Allergen Reactions   Zebeta Other (See Comments)    Does not work, per patient.   Ace Inhibitors Other (See Comments)    cough     REVIEW OF SYSTEMS:   [X]  denotes positive finding, [ ]  denotes negative finding Cardiac  Comments:  Chest pain or chest pressure:    Shortness of breath upon exertion:    Short of breath when lying flat:    Irregular heart rhythm:        Vascular    Pain in calf, thigh, or hip brought on by ambulation:    Pain in feet at night that wakes you up from your sleep:     Blood clot in your veins:    Leg swelling:         Pulmonary    Oxygen at home:    Productive cough:     Wheezing:         Neurologic    Sudden weakness in arms or legs:     Sudden numbness in arms or legs:     Sudden onset  of difficulty speaking or slurred speech:  Temporary loss of vision in one eye:     Problems with dizziness:         Gastrointestinal    Blood in stool:     Vomited blood:         Genitourinary    Burning when urinating:     Blood in urine:        Psychiatric    Major depression:         Hematologic    Bleeding problems:    Problems with blood clotting too easily:        Skin    Rashes or ulcers:        Constitutional    Fever or chills:      PHYSICAL EXAMINATION:  Vitals:   10/15/21 1128 10/15/21 1131  BP: 132/64 133/63  Pulse: 67   Resp: 17   Temp: (!) 97.2 F (36.2 C)   TempSrc: Oral   SpO2: 98%   Weight: 184 lb (83.5 kg)   Height: 5\' 2"  (1.575 m)     General:  WDWN in NAD; vital signs documented above Gait: Not observed HENT: WNL, normocephalic Pulmonary: normal non-labored breathing , without Rales, rhonchi,  wheezing Cardiac: regular HR Abdomen: soft, NT, no masses Skin: without rashes Vascular Exam/Pulses:  Right Left  Radial 2+ (normal) 2+ (normal)  DP 1+ (weak) 2+ (normal)   Extremities: without ischemic changes, without Gangrene , without cellulitis; without open wounds;  Musculoskeletal: no muscle wasting or atrophy  Neurologic: A&O X 3;  CN grossly intact Psychiatric:  The pt has Normal affect.   Non-Invasive Vascular Imaging:   Right ICA peak systolic velocity is 017 cm/s and end-diastolic velocity is 97 cm/s  Left ICA peak systolic velocity is 494 cm/s and end-diastolic velocity is 62 cm/s    ASSESSMENT/PLAN:: 81 y.o. female here for follow up for surveillance of carotid artery stenosis.  History of right carotid endarterectomy  -Subjectively patient denies any strokelike symptoms and does not have any deficit on neuro exam in the office today -Carotid duplex demonstrates increasing peak systolic as well as end-diastolic velocities in the right ICA; stenosis remains asymptomatic -Given that these velocities are approaching critical  levels, we will proceed with CTA head and neck.  Patient will follow-up with Dr. Scot Dock in about 2 weeks to review results and management plan going forward.  Patient is aware she may require revascularization of right ICA   Dagoberto Ligas, PA-C [facility] [phone]  Clinic MD:   Trula Slade

## 2021-11-07 ENCOUNTER — Other Ambulatory Visit: Payer: Self-pay

## 2021-11-07 ENCOUNTER — Ambulatory Visit
Admission: RE | Admit: 2021-11-07 | Discharge: 2021-11-07 | Disposition: A | Discharge status: Home / Self Care | Payer: Medicare Other | Source: Ambulatory Visit | Attending: Vascular Surgery | Admitting: Vascular Surgery

## 2021-11-07 ENCOUNTER — Other Ambulatory Visit: Payer: Medicare Other

## 2021-11-07 DIAGNOSIS — I6523 Occlusion and stenosis of bilateral carotid arteries: Secondary | ICD-10-CM

## 2021-11-07 MED ORDER — IOPAMIDOL (ISOVUE-370) INJECTION 76%
60.0000 mL | Freq: Once | INTRAVENOUS | Status: AC | PRN
  Administered 2021-11-07: 60 mL via INTRAVENOUS

## 2021-11-08 ENCOUNTER — Encounter: Payer: Self-pay | Admitting: Surgery

## 2021-11-08 ENCOUNTER — Ambulatory Visit: Payer: Medicare Other | Admitting: Surgery

## 2021-11-08 VITALS — BP 134/65 | HR 71 | Temp 98.1°F | Resp 16 | Ht 63.0 in | Wt 179.0 lb

## 2021-11-08 DIAGNOSIS — I6523 Occlusion and stenosis of bilateral carotid arteries: Secondary | ICD-10-CM

## 2021-11-08 NOTE — Progress Notes (Signed)
[facility] [place]  Patient name: [name] MRN: [id number] DOB: 15-Mar-1941 Sex: female   REASON FOR VISIT:    Follow up  HISOTRY OF PRESENT ILLNESS:    [name] is a 81 y.o. female with a history of right carotid endarterectomy by Dr. Amedeo Plenty in 1998.  She presented for surveillance follow-up a few weeks ago and was found to have worsening findings on ultrasound and so she was sent for a CT scan.  She is back today to discuss these results.  She remains asymptomatic.  Specifically she denies numbness or weakness in either extremity.  She denies slurred speech.  She denies amaurosis fugax.   PAST MEDICAL HISTORY:   Past Medical History:  Diagnosis Date   Arthritis    Back pain    had cortisone injection 6/15, Dr Durward Fortes   Carotid artery occlusion    Carotid bruit    Colitis, collagenous    Diverticulosis    GERD (gastroesophageal reflux disease)    Hyperlipidemia    Hypertension    Injury of left leg 06/05/12   Pt. fell   Peripheral vascular disease ([place])    Shingles outbreak 2017   Thyroid disease 02-27-12   lt. thyroid nodule, bx. done 5 yrs ago-now some enlargement is seen.     FAMILY HISTORY:   Family History  Problem Relation Age of Onset   Heart disease Mother    Hypertension Mother    Arthritis Mother    Stroke Mother    Hyperlipidemia Mother    Atrial fibrillation Mother    Heart disease Brother        before age 75   Heart attack Neg Hx     SOCIAL HISTORY:   Social History   Tobacco Use   Smoking status: Former    Types: Cigarettes    Quit date: 09/24/1975    Years since quitting: 46.1   Smokeless tobacco: Never  Substance Use Topics   Alcohol use: Yes    Alcohol/week: 1.0 standard drink    Types: 1 Glasses of wine per week     ALLERGIES:   Allergies  Allergen Reactions   Zebeta Other (See Comments)    Does not work, per patient.   Ace Inhibitors Other (See  Comments)    cough     CURRENT MEDICATIONS:   Current Outpatient Medications  Medication Sig Dispense Refill   acetaminophen (TYLENOL) 500 MG tablet Take 1,000 mg by mouth 2 (two) times daily.     amLODipine (NORVASC) 2.5 MG tablet TAKE 1 TABLET BY MOUTH DAILY WITH BREAKFAST 90 tablet 3   aspirin 81 MG EC tablet Take 81 mg by mouth daily.     B Complex Vitamins (VITAMIN-B COMPLEX PO) Take 1 tablet by mouth daily with breakfast.     Cholecalciferol (VITAMIN D) 50 MCG (2000 UT) tablet Take 4,000 Units by mouth daily.     furosemide (LASIX) 40 MG tablet Take 1 tablet (40 mg total) by mouth daily as needed for edema. 30 tablet 6   GEMTESA 75 MG TABS TAKE 1 TABLET BY MOUTH DAILY 30 tablet 2   hydrochlorothiazide (HYDRODIURIL) 25 MG tablet Take 1 tablet (25 mg total) by mouth daily. 90 tablet 2   KRILL OIL PO Take 500 mg by mouth daily. MEGA RED     levothyroxine (SYNTHROID) 112 MCG tablet Take 1 tablet (112 mcg total) by mouth daily. 90 tablet 3   melatonin 5 MG TABS Take  10 mg by mouth at bedtime.      mometasone (ELOCON) 0.1 % cream Apply 1 application topically daily. 45 g 1   Multiple Vitamin (MULTIVITAMIN) tablet Take 1 tablet by mouth daily.       mupirocin ointment (BACTROBAN) 2 % SMARTSIG:1 Application Topical 2-3 Times Daily     rosuvastatin (CRESTOR) 20 MG tablet Take 1 tablet (20 mg total) by mouth daily. 90 tablet 0   telmisartan (MICARDIS) 80 MG tablet TAKE 1 TABLET BY MOUTH DAILY 90 tablet 3   verapamil (CALAN-SR) 240 MG CR tablet TAKE 1 TABLET BY MOUTH DAILY WITH BREAKFAST 90 tablet 3   No current facility-administered medications for this visit.    REVIEW OF SYSTEMS:   [X]  denotes positive finding, [ ]  denotes negative finding Cardiac  Comments:  Chest pain or chest pressure:    Shortness of breath upon exertion:    Short of breath when lying flat:    Irregular heart rhythm:        Vascular    Pain in calf, thigh, or hip brought on by ambulation:    Pain in feet at  night that wakes you up from your sleep:     Blood clot in your veins:    Leg swelling:         Pulmonary    Oxygen at home:    Productive cough:     Wheezing:         Neurologic    Sudden weakness in arms or legs:     Sudden numbness in arms or legs:     Sudden onset of difficulty speaking or slurred speech:    Temporary loss of vision in one eye:     Problems with dizziness:         Gastrointestinal    Blood in stool:     Vomited blood:         Genitourinary    Burning when urinating:     Blood in urine:        Psychiatric    Major depression:         Hematologic    Bleeding problems:    Problems with blood clotting too easily:        Skin    Rashes or ulcers:        Constitutional    Fever or chills:      PHYSICAL EXAM:   Vitals:   11/08/21 1037 11/08/21 1041  BP: (!) 141/63 134/65  Pulse: 71 71  Resp: 16   Temp: 98.1 F (36.7 C)   TempSrc: Temporal   SpO2: 99%   Weight: 179 lb (81.2 kg)   Height: 5\' 3"  (1.6 m)     GENERAL: The patient is a well-nourished female, in no acute distress. The vital signs are documented above. CARDIAC: There is a regular rate and rhythm.  PULMONARY: Non-labored respirations MUSCULOSKELETAL: There are no major deformities or cyanosis. NEUROLOGIC: No focal weakness or paresthesias are detected. SKIN: There are no ulcers or rashes noted. PSYCHIATRIC: The patient has a normal affect.  STUDIES:   I have reviewed her CTA with the following findings: 1. 60% atheromatous narrowing at the left ICA origin. 2. 40% narrowing at the right common carotid bifurcation and proximal ICA, although notable plaque irregularity with web like features. 3. 50% narrowing at the origin of the non dominant right vertebral artery which mainly serves the right PICA.    MEDICAL ISSUES:   Bilateral carotid stenosis: After  reviewing her CT scan, it appears that the ultrasound overestimated the degree of stenosis.  She does have luminal  irregularity and weblike formation as it could have artificially elevated her ultrasound findings.  Regardless, she remains asymptomatic with bilateral stenoses less than 80%, and so no role for surgical intervention at this time.  She will follow-up in 6 months with a repeat ultrasound.  Moving forward we will need to carefully interpret her ultrasounds given the discrepancy between CT and ultrasound recently.    Leia Alf, MD, FACS [facility] [phone] Pager [phone]

## 2021-11-09 ENCOUNTER — Other Ambulatory Visit: Payer: Self-pay | Admitting: *Deleted

## 2021-11-09 DIAGNOSIS — I6523 Occlusion and stenosis of bilateral carotid arteries: Secondary | ICD-10-CM

## 2021-12-07 ENCOUNTER — Other Ambulatory Visit (HOSPITAL_BASED_OUTPATIENT_CLINIC_OR_DEPARTMENT_OTHER): Payer: Self-pay | Admitting: Obstetrics & Gynecology

## 2021-12-07 DIAGNOSIS — N3281 Overactive bladder: Secondary | ICD-10-CM

## 2021-12-10 ENCOUNTER — Other Ambulatory Visit: Payer: Self-pay | Admitting: Interventional Cardiology

## 2022-01-14 ENCOUNTER — Telehealth: Payer: Self-pay | Admitting: Orthopaedic Surgery

## 2022-01-14 NOTE — Telephone Encounter (Signed)
Spoke with patient. She is scheduled to see Dr.Whitfield next Wednesday. In our conversation she mentioned that she is limited in what medications she can take, I recommended that she speak with her PCP regarding pain medicine control. ?

## 2022-01-14 NOTE — Telephone Encounter (Signed)
Patient called. She would like to know if there is something else she could take for pain other than Tylenol  ?

## 2022-01-15 ENCOUNTER — Other Ambulatory Visit: Payer: Self-pay | Admitting: Interventional Cardiology

## 2022-01-18 ENCOUNTER — Telehealth: Payer: Self-pay | Admitting: Orthopaedic Surgery

## 2022-01-18 NOTE — Telephone Encounter (Signed)
Worked her in. Ok per  [employer] [place] [employer]. ?Patient aware. Scheduled in [place]. ? ?

## 2022-01-18 NOTE — Telephone Encounter (Signed)
Pt called and is upset because she has been waiting for this appt to see whitfield and it was scheduled at the [facility] office and not the [facility] office. Now we do not have anything until 05/09 and she wants to be seen asap. She wants to know if she can be worked in sooner  ?

## 2022-01-18 NOTE — Telephone Encounter (Signed)
Duplicate msg.

## 2022-01-18 NOTE — Telephone Encounter (Signed)
Patient called. She would like to get worked in next week to see Dr. Durward Fortes. Her call back number is [phone] ?

## 2022-01-22 ENCOUNTER — Ambulatory Visit: Payer: Medicare Other | Admitting: Orthopaedic Surgery

## 2022-01-22 ENCOUNTER — Encounter: Payer: Self-pay | Admitting: Orthopaedic Surgery

## 2022-01-22 DIAGNOSIS — M5441 Lumbago with sciatica, right side: Secondary | ICD-10-CM

## 2022-01-22 DIAGNOSIS — G8929 Other chronic pain: Secondary | ICD-10-CM | POA: Diagnosis not present

## 2022-01-22 MED ORDER — TRAMADOL HCL 50 MG PO TABS: 50.0000 mg | ORAL_TABLET | Freq: Two times a day (BID) | ORAL | 0 refills | Status: DC | PRN

## 2022-01-22 NOTE — Progress Notes (Signed)
? ?Office Visit Note ?  ?Patient: [name]           ?Date of Birth: Jan 12, 1941           ?MRN: [id number] ?Visit Date: 01/22/2022 ?             ?Requested by: Michael Boston, MD ?[address] ?[place],  [place] [address] ?PCP: Michael Boston, MD ? ? ?Assessment & Plan: ?Visit Diagnoses:  ?1. Chronic right-sided low back pain with right-sided sciatica   ? ? ?Plan: [name] has a chronic history of low back pain with recurrent episodes of low back pain and right lower extremity radiculopathy.  She had an MRI scan performed this past September demonstrating L4-5 severe facet arthrosis with a grade 1 anterolisthesis.  The lateral recess stenosis on the right could contribute to right L5 radiculopathy.  There was lateral recess stenosis at L3-4 and the left and mild right neuroforaminal stenosis at L5-S1.  She recently had an exacerbation of her back pain and was seen by her family physician and placed on a 5-day course of prednisone 20 mg a day.  She also had Zanaflex and the combination made a big difference.  She is having a little recurrence of her pain and is doing her exercises from the prior physical therapy sessions.  She is planning a trip with her daughters to [place] next month and is just concerned that she has a recurrence.  Long discussion regarding all of the above.  Clinically she seems to be fine I will call in some tramadol and she can use on the trip as needed, apply a lumbar support and then she has the Zanaflex.  Be happy to see her back as needed.  She is not having much trouble with her right hip with prior films demonstrating early osteoarthritis ? ?Follow-Up Instructions: Return if symptoms worsen or fail to improve.  ? ?Orders:  ?No orders of the defined types were placed in this encounter. ? ?Meds ordered this encounter  ?Medications  ? traMADol (ULTRAM) 50 MG tablet  ?  Sig: Take 1 tablet (50 mg total) by mouth every 12 (twelve) hours as needed.  ?  Dispense:  30 tablet  ?  Refill:  0   ? ? ? ? Procedures: ?No procedures performed ? ? ?Clinical Data: ?No additional findings. ? ? ?Subjective: ?Chief Complaint  ?Patient presents with  ? Lower Back - Pain  ?Patient presents today for lower back pain. She said that she woke a week ago with pain. No known injury. She has pain and "spasms" in her lower back. She saw her PCP and was give a 5day dose pack of prednisone. She is doing much better and is back to doing her home exercises. She is taking Zanaflex twice daily. She has had injections in her lower back in the past, which have helped.  ? ?HPI ? ?Review of Systems ? ? ?Objective: ?Vital Signs: LMP 09/24/1979  ? ?Physical Exam ?Constitutional:   ?   Appearance: She is well-developed.  ?Pulmonary:  ?   Effort: Pulmonary effort is normal.  ?Skin: ?   General: Skin is warm and dry.  ?Neurological:  ?   Mental Status: She is alert and oriented to person, place, and time.  ?Psychiatric:     ?   Behavior: Behavior normal.  ? ? ?Ortho Exam awake alert and oriented x3.  In no acute distress.  Comfortable sitting and standing.  Straight leg raise negative.  Neurologically intact.  Painless range of motion both hips.  No percussible tenderness of the lumbar spine.  No pain over either SI joint.  No flank pain ? ?Specialty Comments:  ?No specialty comments available. ? ?Imaging: ?No results found. ? ? ?PMFS History: ?Patient Active Problem List  ? Diagnosis Date Noted  ? Encounter for general adult medical examination without abnormal findings 10/15/2021  ? Allergic rhinitis 01/11/2021  ? Chronic kidney disease due to hypertension 01/11/2021  ? Chronic kidney disease, stage 3a ([place]) 01/11/2021  ? Collagenous colitis 01/11/2021  ? Hardening of the aorta (main artery of the heart) ([place]) 01/11/2021  ? Hyperlipidemia 01/11/2021  ? Occlusion and stenosis of bilateral carotid arteries 01/11/2021  ? Primary localized osteoarthritis of pelvic region and thigh 01/11/2021  ? Urinary incontinence 01/11/2021  ? Unilateral  primary osteoarthritis, right hip 05/02/2020  ? Low back pain 05/02/2020  ? Diarrhea 07/26/2019  ? Gaseous regurgitation- burping 07/26/2019  ? Acute nonintractable headache 07/26/2019  ? Other fatigue 07/26/2019  ? Irritable bowel syndrome type symptoms at this time with diarrhea 07/26/2019  ? Stress and adjustment reaction- loss of husband july 2020; settling estate now etc 07/26/2019  ? Vertigo 03/24/2019  ? Glucose intolerance (impaired glucose tolerance) 02/17/2019  ? Other specified hypothyroidism 02/17/2019  ? Caregiver stress 02/17/2019  ? Caregiver stress syndrome 07/27/2018  ? Vitamin D deficiency 09/30/2016  ? Obesity, Class I, BMI 30-34.9 09/30/2016  ? Right carotid bruit 06/07/2016  ? h/o Diverticulosis 06/07/2016  ? GERD (gastroesophageal reflux disease) 06/07/2016  ? Environmental and seasonal allergies 06/07/2016  ? Arthritis 06/07/2016  ? Moderate osteopenia 06/07/2016  ? Bilateral carotid artery disease ([place]) 06/10/2012  ? Hypothyroidism, postsurgical 05/05/2012  ? Dyslipidemia- low HDL and inc VLDL & TG 04/04/2011  ? Benign hypertensive heart disease without heart failure 04/04/2011  ? ?Past Medical History:  ?Diagnosis Date  ? Arthritis   ? Back pain   ? had cortisone injection 6/15, Dr Durward Fortes  ? Carotid artery occlusion   ? Carotid bruit   ? Colitis, collagenous   ? Diverticulosis   ? GERD (gastroesophageal reflux disease)   ? Hyperlipidemia   ? Hypertension   ? Injury of left leg 06/05/12  ? Pt. fell  ? Peripheral vascular disease ([place])   ? Shingles outbreak 2017  ? Thyroid disease 02-27-12  ? lt. thyroid nodule, bx. done 5 yrs ago-now some enlargement is seen.  ?  ?Family History  ?Problem Relation Age of Onset  ? Heart disease Mother   ? Hypertension Mother   ? Arthritis Mother   ? Stroke Mother   ? Hyperlipidemia Mother   ? Atrial fibrillation Mother   ? Heart disease Brother   ?     before age 13  ? Heart attack Neg Hx   ?  ?Past Surgical History:  ?Procedure Laterality Date  ? ABDOMINAL  HYSTERECTOMY  01/1980  ? CAROTID ENDARTERECTOMY  1998  ? Stable since surgery -slight build up returning-follows with yrly Dopplers  ? CATARACT EXTRACTION, BILATERAL  2004 or 2005 per pt  ? COLONOSCOPY    ? EYE SURGERY  02-27-12  ? Bil. Cataract surgery  ? THYROID LOBECTOMY  03/02/2012  ? Procedure: THYROID LOBECTOMY;  Surgeon: Earnstine Regal, MD;  Location: [facility];  Service: General;  Laterality: Left;  ? ?Social History  ? ?Occupational History  ? Not on file  ?Tobacco Use  ? Smoking status: Former  ?  Types: Cigarettes  ?  Quit date:  09/24/1975  ?  Years since quitting: 46.3  ? Smokeless tobacco: Never  ?Vaping Use  ? Vaping Use: Never used  ?Substance and Sexual Activity  ? Alcohol use: Yes  ?  Alcohol/week: 1.0 standard drink  ?  Types: 1 Glasses of wine per week  ? Drug use: No  ? Sexual activity: Not Currently  ?  Partners: Male  ?  Birth control/protection: Surgical  ?  Comment: hysterectomy  ? ? ? ? ? ? ?

## 2022-01-23 ENCOUNTER — Ambulatory Visit: Payer: Medicare Other | Admitting: Orthopaedic Surgery

## 2022-01-24 ENCOUNTER — Other Ambulatory Visit (HOSPITAL_COMMUNITY): Payer: Self-pay | Admitting: *Deleted

## 2022-01-25 ENCOUNTER — Ambulatory Visit (HOSPITAL_COMMUNITY)
Admission: RE | Admit: 2022-01-25 | Discharge: 2022-01-25 | Disposition: A | Discharge status: Home / Self Care | Payer: Medicare Other | Source: Ambulatory Visit | Attending: Internal Medicine | Admitting: Internal Medicine

## 2022-01-25 DIAGNOSIS — M810 Age-related osteoporosis without current pathological fracture: Secondary | ICD-10-CM | POA: Insufficient documentation

## 2022-01-25 MED ORDER — ZOLEDRONIC ACID 5 MG/100ML IV SOLN
5.0000 mg | Freq: Once | INTRAVENOUS | Status: AC
  Administered 2022-01-25: 5 mg via INTRAVENOUS

## 2022-01-25 MED ORDER — ZOLEDRONIC ACID 5 MG/100ML IV SOLN
INTRAVENOUS | Status: AC
  Filled 2022-01-25: qty 100

## 2022-02-19 ENCOUNTER — Encounter: Payer: Self-pay | Admitting: Orthopaedic Surgery

## 2022-02-19 ENCOUNTER — Ambulatory Visit (INDEPENDENT_AMBULATORY_CARE_PROVIDER_SITE_OTHER): Payer: Medicare Other

## 2022-02-19 ENCOUNTER — Ambulatory Visit: Payer: Medicare Other | Admitting: Orthopaedic Surgery

## 2022-02-19 DIAGNOSIS — M1711 Unilateral primary osteoarthritis, right knee: Secondary | ICD-10-CM | POA: Diagnosis not present

## 2022-02-19 DIAGNOSIS — G8929 Other chronic pain: Secondary | ICD-10-CM

## 2022-02-19 DIAGNOSIS — M25561 Pain in right knee: Secondary | ICD-10-CM

## 2022-02-19 MED ORDER — METHYLPREDNISOLONE ACETATE 40 MG/ML IJ SUSP
80.0000 mg | INTRAMUSCULAR | Status: AC | PRN
  Administered 2022-02-19: 80 mg via INTRA_ARTICULAR

## 2022-02-19 MED ORDER — LIDOCAINE HCL 1 % IJ SOLN
2.0000 mL | INTRAMUSCULAR | Status: AC | PRN
  Administered 2022-02-19: 2 mL

## 2022-02-19 MED ORDER — BUPIVACAINE HCL 0.25 % IJ SOLN
2.0000 mL | INTRAMUSCULAR | Status: AC | PRN
  Administered 2022-02-19: 2 mL via INTRA_ARTICULAR

## 2022-02-19 NOTE — Progress Notes (Signed)
Office Visit Note   Patient: [name]           Date of Birth: May 09, 1941           MRN: [id number] Visit Date: 02/19/2022              Requested by: Michael Boston, MD [address] [place],  [place] [address] PCP: Michael Boston, MD   Assessment & Plan: Visit Diagnoses:  1. Chronic pain of right knee   2. Unilateral primary osteoarthritis, right knee     Plan: [name] has osteoarthritis in the right knee predominantly in the medial compartment and at the patellofemoral joint.  Will inject with Depo-Medrol and monitor response.  Might be a candidate for viscosupplementation.  Follow-Up Instructions: Return if symptoms worsen or fail to improve.   Orders:  Orders Placed This Encounter  Procedures   XR KNEE 3 VIEW RIGHT   No orders of the defined types were placed in this encounter.     Procedures: Large Joint Inj: R knee on 02/19/2022 2:43 PM Indications: pain and diagnostic evaluation Details: 25 G 1.5 in needle, anteromedial approach  Arthrogram: No  Medications: 2 mL lidocaine 1 %; 80 mg methylPREDNISolone acetate 40 MG/ML; 2 mL bupivacaine 0.25 % Procedure, treatment alternatives, risks and benefits explained, specific risks discussed. Consent was given by the patient. Immediately prior to procedure a time out was called to verify the correct patient, procedure, equipment, support staff and site/side marked as required. Patient was prepped and draped in the usual sterile fashion.      Clinical Data: No additional findings.   Subjective: Chief Complaint  Patient presents with   Right Knee - Pain  Patient presents today for right knee pain. She said that it started about 6 days ago, no known injury. Her pain was initially posteriorly She said that she did a lot of walking on uneven surfaces this past weekend while attending a graduation and now the pain is all throughout her knee. She is not improving. She is walking with the assistance of a cane. She feels  better first thing in the morning, but the pain returns with walking. She has been taking Tylenol or Aleve as needed. She has a history of a Bakers cyst rupture 8years ago and said that it feels the same was it did then.  HPI  Review of Systems   Objective: Vital Signs: LMP 09/24/1979   Physical Exam Constitutional:      Appearance: She is well-developed.  Eyes:     Pupils: Pupils are equal, round, and reactive to light.  Pulmonary:     Effort: Pulmonary effort is normal.  Skin:    General: Skin is warm and dry.  Neurological:     Mental Status: She is alert and oriented to person, place, and time.  Psychiatric:        Behavior: Behavior normal.    Ortho Exam awake alert and oriented x3.  Comfortable sitting.  No acute distress.  Does use a cane for ambulation referable to her right knee.  The right knee had a small effusion but was not hot red or warm.  Mostly medial joint pain but relatively mild.  Some patella crepitation but no pain with compression.  There was some fullness in the popliteal space but no pain.  No calf pain.  No lateral joint pain or evidence of instability.  No pain with range of motion of either hip.  Motor exam intact  Specialty Comments:  No specialty comments available.  Imaging: XR KNEE 3 VIEW RIGHT  Result Date: 02/19/2022 Films of the right knee are obtained in 3 projections standing.  There are tricompartmental arthritis changes predominantly in the medial compartment with there is near bone-on-bone in about 1 to 2 degrees of varus.  There are are some small osteophytes and subchondral sclerosis.  There is also significant lateral facet degenerative change of patellofemoral joint and lesser changes in the lateral compartment.  Films are consistent with advanced osteoarthritis.  No acute changes or ectopic calcification    PMFS History: Patient Active Problem List   Diagnosis Date Noted   Unilateral primary osteoarthritis, right knee 02/19/2022    Encounter for general adult medical examination without abnormal findings 10/15/2021   Allergic rhinitis 01/11/2021   Chronic kidney disease due to hypertension 01/11/2021   Chronic kidney disease, stage 3a ([place]) 01/11/2021   Collagenous colitis 01/11/2021   Hardening of the aorta (main artery of the heart) ([place]) 01/11/2021   Hyperlipidemia 01/11/2021   Occlusion and stenosis of bilateral carotid arteries 01/11/2021   Primary localized osteoarthritis of pelvic region and thigh 01/11/2021   Urinary incontinence 01/11/2021   Unilateral primary osteoarthritis, right hip 05/02/2020   Low back pain 05/02/2020   Diarrhea 07/26/2019   Gaseous regurgitation- burping 07/26/2019   Acute nonintractable headache 07/26/2019   Other fatigue 07/26/2019   Irritable bowel syndrome type symptoms at this time with diarrhea 07/26/2019   Stress and adjustment reaction- loss of husband july 2020; settling estate now etc 07/26/2019   Vertigo 03/24/2019   Glucose intolerance (impaired glucose tolerance) 02/17/2019   Other specified hypothyroidism 02/17/2019   Caregiver stress 02/17/2019   Caregiver stress syndrome 07/27/2018   Vitamin D deficiency 09/30/2016   Obesity, Class I, BMI 30-34.9 09/30/2016   Right carotid bruit 06/07/2016   h/o Diverticulosis 06/07/2016   GERD (gastroesophageal reflux disease) 06/07/2016   Environmental and seasonal allergies 06/07/2016   Arthritis 06/07/2016   Moderate osteopenia 06/07/2016   Bilateral carotid artery disease ([place]) 06/10/2012   Hypothyroidism, postsurgical 05/05/2012   Dyslipidemia- low HDL and inc VLDL & TG 04/04/2011   Benign hypertensive heart disease without heart failure 04/04/2011   Past Medical History:  Diagnosis Date   Arthritis    Back pain    had cortisone injection 6/15, Dr Durward Fortes   Carotid artery occlusion    Carotid bruit    Colitis, collagenous    Diverticulosis    GERD (gastroesophageal reflux disease)    Hyperlipidemia     Hypertension    Injury of left leg 06/05/12   Pt. fell   Peripheral vascular disease ([place])    Shingles outbreak 2017   Thyroid disease 02-27-12   lt. thyroid nodule, bx. done 5 yrs ago-now some enlargement is seen.    Family History  Problem Relation Age of Onset   Heart disease Mother    Hypertension Mother    Arthritis Mother    Stroke Mother    Hyperlipidemia Mother    Atrial fibrillation Mother    Heart disease Brother        before age 43   Heart attack Neg Hx     Past Surgical History:  Procedure Laterality Date   ABDOMINAL HYSTERECTOMY  01/1980   CAROTID ENDARTERECTOMY  1998   Stable since surgery -slight build up returning-follows with yrly Dopplers   CATARACT EXTRACTION, BILATERAL  2004 or 2005 per pt   COLONOSCOPY     EYE SURGERY  02-27-12   Bil. Cataract surgery   THYROID LOBECTOMY  03/02/2012   Procedure: THYROID LOBECTOMY;  Surgeon: Earnstine Regal, MD;  Location: [facility];  Service: General;  Laterality: Left;   Social History   Occupational History   Not on file  Tobacco Use   Smoking status: Former    Types: Cigarettes    Quit date: 09/24/1975    Years since quitting: 46.4   Smokeless tobacco: Never  Vaping Use   Vaping Use: Never used  Substance and Sexual Activity   Alcohol use: Yes    Alcohol/week: 1.0 standard drink    Types: 1 Glasses of wine per week   Drug use: No   Sexual activity: Not Currently    Partners: Male    Birth control/protection: Surgical    Comment: hysterectomy

## 2022-03-04 ENCOUNTER — Other Ambulatory Visit: Payer: Self-pay | Admitting: Physical Medicine and Rehabilitation

## 2022-03-04 DIAGNOSIS — Z1231 Encounter for screening mammogram for malignant neoplasm of breast: Secondary | ICD-10-CM

## 2022-03-05 ENCOUNTER — Other Ambulatory Visit (HOSPITAL_BASED_OUTPATIENT_CLINIC_OR_DEPARTMENT_OTHER): Payer: Self-pay | Admitting: Obstetrics & Gynecology

## 2022-03-05 DIAGNOSIS — N3281 Overactive bladder: Secondary | ICD-10-CM

## 2022-04-05 ENCOUNTER — Ambulatory Visit
Admission: RE | Admit: 2022-04-05 | Discharge: 2022-04-05 | Disposition: A | Discharge status: Home / Self Care | Payer: Medicare Other | Source: Ambulatory Visit | Attending: Physical Medicine and Rehabilitation | Admitting: Physical Medicine and Rehabilitation

## 2022-04-05 DIAGNOSIS — Z1231 Encounter for screening mammogram for malignant neoplasm of breast: Secondary | ICD-10-CM

## 2022-04-09 ENCOUNTER — Other Ambulatory Visit (HOSPITAL_BASED_OUTPATIENT_CLINIC_OR_DEPARTMENT_OTHER): Payer: Self-pay | Admitting: Obstetrics & Gynecology

## 2022-04-09 DIAGNOSIS — N3281 Overactive bladder: Secondary | ICD-10-CM

## 2022-04-11 ENCOUNTER — Other Ambulatory Visit: Payer: Self-pay | Admitting: Interventional Cardiology

## 2022-05-16 ENCOUNTER — Other Ambulatory Visit (HOSPITAL_BASED_OUTPATIENT_CLINIC_OR_DEPARTMENT_OTHER): Payer: Self-pay | Admitting: *Deleted

## 2022-05-16 DIAGNOSIS — N3281 Overactive bladder: Secondary | ICD-10-CM

## 2022-05-16 MED ORDER — GEMTESA 75 MG PO TABS: 1.0000 | ORAL_TABLET | Freq: Every day | ORAL | 0 refills | Status: DC

## 2022-06-05 ENCOUNTER — Other Ambulatory Visit: Payer: Self-pay | Admitting: Interventional Cardiology

## 2022-06-07 ENCOUNTER — Ambulatory Visit: Payer: Medicare Other | Attending: Interventional Cardiology | Admitting: Interventional Cardiology

## 2022-06-07 ENCOUNTER — Encounter: Payer: Self-pay | Admitting: Interventional Cardiology

## 2022-06-07 VITALS — BP 140/60 | HR 66 | Ht 62.0 in | Wt 185.0 lb

## 2022-06-07 DIAGNOSIS — E785 Hyperlipidemia, unspecified: Secondary | ICD-10-CM

## 2022-06-07 DIAGNOSIS — I119 Hypertensive heart disease without heart failure: Secondary | ICD-10-CM | POA: Diagnosis not present

## 2022-06-07 DIAGNOSIS — T148XXA Other injury of unspecified body region, initial encounter: Secondary | ICD-10-CM

## 2022-06-07 DIAGNOSIS — T148XXD Other injury of unspecified body region, subsequent encounter: Secondary | ICD-10-CM

## 2022-06-07 DIAGNOSIS — I6523 Occlusion and stenosis of bilateral carotid arteries: Secondary | ICD-10-CM

## 2022-06-07 NOTE — Progress Notes (Signed)
Cardiology Office Note   Date:  06/07/2022   ID:  [name], DOB 1941/02/14, MRN [id number]  PCP:  Michael Boston, MD    No chief complaint on file.  Hypertensive heart disease  Wt Readings from Last 3 Encounters:  06/07/22 185 lb (83.9 kg)  01/25/22 182 lb (82.6 kg)  11/08/21 179 lb (81.2 kg)       History of Present Illness: [name] is a 81 y.o. female   who has a past history of dyslipidemia.  She was previously followed by Dr. Mare Ferrari.  She has a history of known residual right carotid bruit after remote right carotid endarterectomy.   She had a normal nuclear stress test in 01-02-04. She has had essential hypertension and obesity.    Her thyroid disease is followed by her GYN physician. She follows with vascular surgery regarding her carotid disease.   Chronic GERD sx.   In July 2020, stress test: Nuclear stress EF: 77%. The left ventricular ejection fraction is hyperdynamic (>65%). There was no ST segment deviation noted during stress. Defect 1: There is a small defect of mild severity present in the basal anterior, mid anterior and apical anterior location. This appears to be most consistent with breast attenuation This is a low risk study. There is no evidence of ischemia or previous infarction. The study is normal.   In July 2020, she  Had some dizziness with turning:"had an episode of vertigo Monday assoc with N/V and elevated BP. EMS ran an EKG which was normal. High salt diet the night before. Under a lot of stress with her husband in hospice care.  Patient would like to proceed with echo and stress test to rule out cardiac source. Increase HCTZ to 25 mg 1 1/2 daily.F/U with Dr. Irish Lack after testing."  BP was high at that time.     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 Nolene Bernheim, who had metastatic prostate cancer.  He passed away in 01-02-19.     She had her COVID vaccines.   She has taken a steroid for collagenous colitis. When she  tapered off, it flares up.     In the past, she felt some dizziness, unsteadiness.  Worse after taking her BP meds. She had some palpitations, and they were isolated premature beats.  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  She repeats these exercise several times a week.  No cardiovascular sx.     Doing PT for back.  Now with right hip pain.  Injections with Dr. Durward Fortes. These are giving relief.  Trying to avoid hip replacement.   She has had a persistent cough, for 11 months. Tried allergy meds.  Did not change the cough.  She saw an ENT and Dr. Benjamine Mola started Atrovent. She still has a cough. Has some whitish phlegm.  In 01-01-2022, she had some more swelling.  Took a few doses of Lasix.  Tested positive for Covid in 7/23.  Isolated and took Merck antiviral.  Planning on taking RSV, Covid and flu shots.   Past Medical History:  Diagnosis Date   Arthritis    Back pain    had cortisone injection 6/15, Dr Durward Fortes   Carotid artery occlusion    Carotid bruit    Colitis, collagenous    Diverticulosis    GERD (gastroesophageal reflux disease)    Hyperlipidemia    Hypertension    Injury of left leg 06/05/12  Pt. fell   Peripheral vascular disease ([place])    Shingles outbreak 2017   Thyroid disease 02-27-12   lt. thyroid nodule, bx. done 5 yrs ago-now some enlargement is seen.    Past Surgical History:  Procedure Laterality Date   ABDOMINAL HYSTERECTOMY  01/1980   CAROTID ENDARTERECTOMY  1998   Stable since surgery -slight build up returning-follows with yrly Dopplers   CATARACT EXTRACTION, BILATERAL  2004 or 2005 per pt   COLONOSCOPY     EYE SURGERY  02-27-12   Bil. Cataract surgery   THYROID LOBECTOMY  03/02/2012   Procedure: THYROID LOBECTOMY;  Surgeon: Earnstine Regal, MD;  Location: [facility];  Service: General;  Laterality: Left;     Current Outpatient Medications  Medication Sig Dispense Refill   acetaminophen (TYLENOL) 500 MG tablet Take 1,000 mg by mouth 2 (two) times  daily.     amLODipine (NORVASC) 2.5 MG tablet Take 1 tablet (2.5 mg total) by mouth daily with breakfast. 30 tablet 0   aspirin 81 MG EC tablet Take 81 mg by mouth daily.     B Complex Vitamins (VITAMIN-B COMPLEX PO) Take 1 tablet by mouth daily with breakfast.     Cholecalciferol (VITAMIN D) 50 MCG (2000 UT) tablet Take 4,000 Units by mouth daily.     furosemide (LASIX) 40 MG tablet Take 1 tablet (40 mg total) by mouth daily as needed for edema. 30 tablet 6   hydrochlorothiazide (HYDRODIURIL) 25 MG tablet Take 1 tablet (25 mg total) by mouth daily. 30 tablet 0   KRILL OIL PO Take 500 mg by mouth daily. MEGA RED     levothyroxine (SYNTHROID) 112 MCG tablet Take 1 tablet (112 mcg total) by mouth daily. 90 tablet 3   metoprolol succinate (TOPROL-XL) 100 MG 24 hr tablet Take 100 mg by mouth daily. Take with or immediately following a meal.     mometasone (ELOCON) 0.1 % cream Apply 1 application topically daily. 45 g 1   Multiple Vitamin (MULTIVITAMIN) tablet Take 1 tablet by mouth daily.       mupirocin ointment (BACTROBAN) 2 % SMARTSIG:1 Application Topical 2-3 Times Daily     Psyllium (METAMUCIL PO) Take 1 Capful by mouth daily.     rosuvastatin (CRESTOR) 20 MG tablet TAKE 1 TABLET BY MOUTH DAILY 90 tablet 0   telmisartan (MICARDIS) 80 MG tablet TAKE 1 TABLET BY MOUTH DAILY 90 tablet 3   traMADol (ULTRAM) 50 MG tablet Take 1 tablet (50 mg total) by mouth every 12 (twelve) hours as needed. 30 tablet 0   verapamil (CALAN-SR) 240 MG CR tablet TAKE 1 TABLET BY MOUTH DAILY WITH BREAKFAST 90 tablet 3   Vibegron (GEMTESA) 75 MG TABS Take 1 tablet by mouth daily. 30 tablet 0   famotidine (PEPCID) 20 MG tablet Take 1 tablet by mouth as needed.     melatonin 5 MG TABS Take 10 mg by mouth at bedtime.  (Patient not taking: Reported on 06/07/2022)     No current facility-administered medications for this visit.    Allergies:   Zebeta and Ace inhibitors    Social History:  The patient  reports that she  quit smoking about 46 years ago. Her smoking use included cigarettes. She has never used smokeless tobacco. She reports current alcohol use of about 1.0 standard drink of alcohol per week. She reports that she does not use drugs.   Family History:  The patient's family history includes Arthritis in her mother; Atrial fibrillation  in her mother; Heart disease in her brother and mother; Hyperlipidemia in her mother; Hypertension in her mother; Stroke in her mother.    ROS:  Please see the history of present illness.   Otherwise, review of systems are positive for cough-light.   All other systems are reviewed and negative.    PHYSICAL EXAM: VS:  BP (!) 140/60   Pulse 66   Ht '5\' 2"'$  (1.575 m)   Wt 185 lb (83.9 kg)   LMP 09/24/1979   SpO2 97%   BMI 33.84 kg/m  , BMI Body mass index is 33.84 kg/m. GEN: Well nourished, well developed, in no acute distress HEENT: normal Neck: no JVD, ; right carotid bruit, no masses Cardiac: RRR; no murmurs, rubs, or gallops,; tr ankle edema  Respiratory:  clear to auscultation bilaterally, normal work of breathing GI: soft, nontender, nondistended, + BS MS: no deformity or atrophy Skin: warm and dry, no rash Neuro:  Strength and sensation are intact Psych: euthymic mood, full affect   EKG:   The ekg ordered today demonstrates NSR, no ST changes   Recent Labs: No results found for requested labs within last 365 days.   Lipid Panel    Component Value Date/Time   CHOL 172 06/15/2020 1210   TRIG 131 06/15/2020 1210   HDL 50 06/15/2020 1210   CHOLHDL 3.4 06/15/2020 1210   CHOLHDL 3.9 08/26/2016 1035   VLDL 32 (H) 08/26/2016 1035   [place] 99 06/15/2020 1210   LDLDIRECT 73.4 01/07/2013 0939     Other studies Reviewed: Additional studies/ records that were reviewed today with results demonstrating: labs reviewed.   ASSESSMENT AND PLAN:  Hypertensive heart disease: The current medical regimen is effective;  continue present plan and  medications.  Readings at home are in the 034-742V systolic. Cough: Persistent for some time now.  Improving. Hyperlipidemia: July 2023 total cholesterol 149 HDL 40 LDL 71 triglycerides 191.  Continue rosuvastatin.   Carotid disease: followed with [facility].   Hypothyroid: managed by PMD.  Bruising: improved off of steroids.   Current medicines are reviewed at length with the patient today.  The patient concerns regarding her medicines were addressed.  The following changes have been made:  No change  Labs/ tests ordered today include:  No orders of the defined types were placed in this encounter.   Recommend 150 minutes/week of aerobic exercise Low fat, low carb, high fiber diet recommended  Disposition:   FU in 8-9 months   Signed, Larae Grooms, MD  06/07/2022 5:01 PM    [facility] [place], [place], [place]  [address] Phone: [phone]; Fax: [phone]

## 2022-06-07 NOTE — Patient Instructions (Signed)
Medication Instructions:  Your physician recommends that you continue on your current medications as directed. Please refer to the Current Medication list given to you today.  *If you need a refill on your cardiac medications before your next appointment, please call your pharmacy*   Lab Work: none If you have labs (blood work) drawn today and your tests are completely normal, you will receive your results only by: [place] (if you have MyChart) OR A paper copy in the mail If you have any lab test that is abnormal or we need to change your treatment, we will call you to review the results.   Testing/Procedures: none   Follow-Up: At [facility], you and your health needs are our priority.  As part of our continuing mission to provide you with exceptional heart care, we have created designated Provider Care Teams.  These Care Teams include your primary Cardiologist (physician) and Advanced Practice Providers (APPs -  Physician Assistants and Nurse Practitioners) who all work together to provide you with the care you need, when you need it.  We recommend signing up for the patient portal called "MyChart".  Sign up information is provided on this After Visit Summary.  MyChart is used to connect with patients for Virtual Visits (Telemedicine).  Patients are able to view lab/test results, encounter notes, upcoming appointments, etc.  Non-urgent messages can be sent to your provider as well.   To learn more about what you can do with MyChart, go to [email or web address].    Your next appointment:   May/June 2024  The format for your next appointment:   In Person  Provider:   Larae Grooms, MD     Other Instructions    Important Information About Sugar

## 2022-06-19 ENCOUNTER — Other Ambulatory Visit (HOSPITAL_BASED_OUTPATIENT_CLINIC_OR_DEPARTMENT_OTHER): Payer: Self-pay | Admitting: Obstetrics & Gynecology

## 2022-06-19 DIAGNOSIS — N3281 Overactive bladder: Secondary | ICD-10-CM

## 2022-07-01 ENCOUNTER — Encounter (HOSPITAL_BASED_OUTPATIENT_CLINIC_OR_DEPARTMENT_OTHER): Payer: Self-pay | Admitting: Obstetrics & Gynecology

## 2022-07-01 ENCOUNTER — Ambulatory Visit (INDEPENDENT_AMBULATORY_CARE_PROVIDER_SITE_OTHER): Payer: Medicare Other | Admitting: Obstetrics & Gynecology

## 2022-07-01 VITALS — BP 139/50 | HR 72 | Ht 63.0 in | Wt 185.2 lb

## 2022-07-01 DIAGNOSIS — Z780 Asymptomatic menopausal state: Secondary | ICD-10-CM

## 2022-07-01 DIAGNOSIS — N3281 Overactive bladder: Secondary | ICD-10-CM

## 2022-07-01 DIAGNOSIS — Z01419 Encounter for gynecological examination (general) (routine) without abnormal findings: Secondary | ICD-10-CM

## 2022-07-01 DIAGNOSIS — N9089 Other specified noninflammatory disorders of vulva and perineum: Secondary | ICD-10-CM | POA: Diagnosis not present

## 2022-07-01 MED ORDER — GEMTESA 75 MG PO TABS: 1.0000 | ORAL_TABLET | Freq: Every day | ORAL | 4 refills | Status: DC

## 2022-07-01 NOTE — Progress Notes (Signed)
81 y.o. G2P2 Widowed White or Caucasian female here for breast and pelvic exam.  I am also following her for complaints of OAB.  Logan Bores helps her a lot during the day.  Does need RF.  Is pleased with how much it helps during the day.  However, still has some issues at night and if needs to get up at night, can have urinary leakage.  PT discussed.  Interested in this.  Referral to urogyn discussed.  Will hold on this right now.    Denies vaginal bleeding.  She is receiving reclast.  Has done two doses.  Will have BMD done again next year.  Patient's last menstrual period was 09/24/1979.          Sexually active: No.  H/O STD:  no  Health Maintenance: PCP:  Dr. Jacalyn Lefevre.  Last wellness appt was 03/2022.  Did blood work at that appt:  yes Vaccines are up to date:  planning on having Covid, flu and RSV done this fall Colonoscopy:  09/10/2019 MMG:  04/05/2022 Negative BMD:  11/07/2020  Last pap smear:  11/24/2018 Negative.   H/o abnormal pap smear:  no    reports that she quit smoking about 46 years ago. Her smoking use included cigarettes. She has never used smokeless tobacco. She reports current alcohol use of about 1.0 standard drink of alcohol per week. She reports that she does not use drugs.  Past Medical History:  Diagnosis Date   Arthritis    Back pain    had cortisone injection 6/15, Dr Durward Fortes   Carotid artery occlusion    Carotid bruit    Colitis, collagenous    Diverticulosis    GERD (gastroesophageal reflux disease)    Hyperlipidemia    Hypertension    Injury of left leg 06/05/12   Pt. fell   Peripheral vascular disease ([place])    Shingles outbreak 2017   Thyroid disease 02-27-12   lt. thyroid nodule, bx. done 5 yrs ago-now some enlargement is seen.    Past Surgical History:  Procedure Laterality Date   ABDOMINAL HYSTERECTOMY  01/1980   CAROTID ENDARTERECTOMY  1998   Stable since surgery -slight build up returning-follows with yrly Dopplers   CATARACT EXTRACTION,  BILATERAL  2004 or 2005 per pt   COLONOSCOPY     EYE SURGERY  02-27-12   Bil. Cataract surgery   THYROID LOBECTOMY  03/02/2012   Procedure: THYROID LOBECTOMY;  Surgeon: Earnstine Regal, MD;  Location: [facility];  Service: General;  Laterality: Left;    Current Outpatient Medications  Medication Sig Dispense Refill   acetaminophen (TYLENOL) 500 MG tablet Take 1,000 mg by mouth 2 (two) times daily.     aspirin 81 MG EC tablet Take 81 mg by mouth daily.     B Complex Vitamins (VITAMIN-B COMPLEX PO) Take 1 tablet by mouth daily with breakfast.     Cholecalciferol (VITAMIN D) 50 MCG (2000 UT) tablet Take 4,000 Units by mouth daily.     famotidine (PEPCID) 20 MG tablet Take 1 tablet by mouth as needed.     furosemide (LASIX) 40 MG tablet Take 1 tablet (40 mg total) by mouth daily as needed for edema. 30 tablet 6   KRILL OIL PO Take 500 mg by mouth daily. MEGA RED     levothyroxine (SYNTHROID) 112 MCG tablet Take 1 tablet (112 mcg total) by mouth daily. 90 tablet 3   melatonin 5 MG TABS Take 10 mg by mouth at bedtime.  metoprolol succinate (TOPROL-XL) 100 MG 24 hr tablet Take 100 mg by mouth daily. Take with or immediately following a meal.     mometasone (ELOCON) 0.1 % cream Apply 1 application topically daily. 45 g 1   Multiple Vitamin (MULTIVITAMIN) tablet Take 1 tablet by mouth daily.       mupirocin ointment (BACTROBAN) 2 % SMARTSIG:1 Application Topical 2-3 Times Daily     Psyllium (METAMUCIL PO) Take 1 Capful by mouth daily.     rosuvastatin (CRESTOR) 20 MG tablet TAKE 1 TABLET BY MOUTH DAILY 90 tablet 0   telmisartan (MICARDIS) 80 MG tablet TAKE 1 TABLET BY MOUTH DAILY 90 tablet 3   traMADol (ULTRAM) 50 MG tablet Take 1 tablet (50 mg total) by mouth every 12 (twelve) hours as needed. 30 tablet 0   verapamil (CALAN-SR) 240 MG CR tablet TAKE 1 TABLET BY MOUTH DAILY WITH BREAKFAST 90 tablet 3   amLODipine (NORVASC) 2.5 MG tablet TAKE 1 TABLET BY MOUTH DAILY WITH BREAKFAST 90 tablet 3    hydrochlorothiazide (HYDRODIURIL) 25 MG tablet Take 1 tablet by mouth daily. 90 tablet 3   Vibegron (GEMTESA) 75 MG TABS Take 1 tablet by mouth daily. 90 tablet 4   No current facility-administered medications for this visit.    Family History  Problem Relation Age of Onset   Heart disease Mother    Hypertension Mother    Arthritis Mother    Stroke Mother    Hyperlipidemia Mother    Atrial fibrillation Mother    Heart disease Brother        before age 71   Heart attack Neg Hx    Breast cancer Neg Hx     Review of Systems  Constitutional: Negative.   Genitourinary: Negative.     Exam:   BP (!) 139/50 (BP Location: Left Arm, Patient Position: Sitting, Cuff Size: Large)   Pulse 72   Ht '5\' 3"'$  (1.6 m) Comment: Reported  Wt 185 lb 3.2 oz (84 kg)   LMP 09/24/1979   BMI 32.81 kg/m   Height: '5\' 3"'$  (160 cm) (Reported)  General appearance: alert, cooperative and appears stated age Breasts: normal appearance, no masses or tenderness Abdomen: soft, non-tender; bowel sounds normal; no masses,  no organomegaly Lymph nodes: Cervical, supraclavicular, and axillary nodes normal.  No abnormal inguinal nodes palpated Neurologic: Grossly normal  Pelvic: External genitalia:  no lesions              Urethra:  normal appearing urethra with no masses, tenderness or lesions              Bartholins and Skenes: normal                 Vagina: normal appearing vagina with atrophic changes and no discharge, no lesions              Cervix: absent              Pap taken: No. Bimanual Exam:  Uterus:  uterus absent              Adnexa: no mass, fullness, tenderness               Rectovaginal: Confirms               Anus:  normal sphincter tone, no lesions  Chaperone, Octaviano Batty, CMA, was present for exam.  Assessment/Plan: 1. Encntr for gyn exam (general) (routine) w/o abn findings - Pap smear not indicated.  Reviewed guidelines.   - Mammogram 04/05/2022 - Colonoscopy 12/18/202 - Bone mineral  density 11/07/2020 - lab work done done with Dr. Jacalyn Lefevre - vaccines reviewed/updated.  Pt is planning on having Covid update, getting influenza vaccination and RSV this fall.  2. OAB (overactive bladder) - Vibegron (GEMTESA) 75 MG TABS; Take 1 tablet by mouth daily.  Dispense: 90 tablet; Refill: 4 - Ambulatory referral to Physical Therapy - consider urogyn referral if this does not help.  TNS, Botox could be options for her as well  3. Postmenopausal - no HRT  4. Vulvar irritation - does use mometasone on occasion but does not need RF.

## 2022-07-03 ENCOUNTER — Other Ambulatory Visit: Payer: Self-pay | Admitting: Interventional Cardiology

## 2022-07-11 ENCOUNTER — Other Ambulatory Visit: Payer: Self-pay | Admitting: Interventional Cardiology

## 2022-07-11 ENCOUNTER — Ambulatory Visit (HOSPITAL_BASED_OUTPATIENT_CLINIC_OR_DEPARTMENT_OTHER): Payer: Medicare Other | Admitting: Obstetrics & Gynecology

## 2022-07-22 NOTE — Therapy (Signed)
OUTPATIENT PHYSICAL THERAPY FEMALE PELVIC EVALUATION   Patient Name: [name] MRN: [id number] DOB:05-13-1941, 81 y.o., female Today's Date: 07/23/2022   PT End of Session - 07/23/22 0927     Visit Number 1    Date for PT Re-Evaluation 09/17/22    Authorization Type [employer] Medicare    PT Start Time 0930    PT Stop Time 1015    PT Time Calculation (min) 45 min    Activity Tolerance Patient tolerated treatment well    Behavior During Therapy [facility] for tasks assessed/performed             Past Medical History:  Diagnosis Date   Arthritis    Back pain    had cortisone injection 6/15, Dr Durward Fortes   Carotid artery occlusion    Carotid bruit    Colitis, collagenous    Diverticulosis    GERD (gastroesophageal reflux disease)    Hyperlipidemia    Hypertension    Injury of left leg 06/05/12   Pt. fell   Peripheral vascular disease ([place])    Shingles outbreak 2017   Thyroid disease 02-27-12   lt. thyroid nodule, bx. done 5 yrs ago-now some enlargement is seen.   Past Surgical History:  Procedure Laterality Date   ABDOMINAL HYSTERECTOMY  01/1980   CAROTID ENDARTERECTOMY  1998   Stable since surgery -slight build up returning-follows with yrly Dopplers   CATARACT EXTRACTION, BILATERAL  2004 or 2005 per pt   COLONOSCOPY     EYE SURGERY  02-27-12   Bil. Cataract surgery   THYROID LOBECTOMY  03/02/2012   Procedure: THYROID LOBECTOMY;  Surgeon: Earnstine Regal, MD;  Location: [facility];  Service: General;  Laterality: Left;   Patient Active Problem List   Diagnosis Date Noted   Unilateral primary osteoarthritis, right knee 02/19/2022   Encounter for general adult medical examination without abnormal findings 10/15/2021   Allergic rhinitis 01/11/2021   Chronic kidney disease, stage 3a ([place]) 01/11/2021   Collagenous colitis 01/11/2021   Hardening of the aorta (main artery of the heart) ([place]) 01/11/2021   Hyperlipidemia 01/11/2021   Primary localized osteoarthritis of pelvic region  and thigh 01/11/2021   Urinary incontinence 01/11/2021   Unilateral primary osteoarthritis, right hip 05/02/2020   Low back pain 05/02/2020   Gaseous regurgitation- burping 07/26/2019   Other fatigue 07/26/2019   Irritable bowel syndrome type symptoms at this time with diarrhea 07/26/2019   Vertigo 03/24/2019   Glucose intolerance (impaired glucose tolerance) 02/17/2019   Vitamin D deficiency 09/30/2016   Obesity, Class I, BMI 30-34.9 09/30/2016   h/o Diverticulosis 06/07/2016   GERD (gastroesophageal reflux disease) 06/07/2016   Environmental and seasonal allergies 06/07/2016   Arthritis 06/07/2016   Moderate osteopenia 06/07/2016   Bilateral carotid artery disease ([place]) 06/10/2012   Hypothyroidism, postsurgical 05/05/2012   Dyslipidemia- low HDL and inc VLDL & TG 04/04/2011   Benign hypertensive heart disease without heart failure 04/04/2011    PCP: Michael Boston, MD  REFERRING PROVIDER: Megan Salon, MD  REFERRING DIAG: N32.81 (ICD-10-CM) - OAB (overactive bladder)  THERAPY DIAG:  Abnormal posture  OAB (overactive bladder)  Muscle weakness (generalized)  Unspecified lack of coordination  Rationale for Evaluation and Treatment: Rehabilitation  ONSET DATE: several years ago  SUBJECTIVE:  07/23/22 SUBJECTIVE STATEMENT: Pt states that she has had several years of stress urinary incontinence with laughing/coughing. But now, if she sits and plays bridge for 2 or 3 ours, she would stand up and gush urine out; she had to start wearing thicker pads at that time. A year ago she started trialling different medications for overactive bladder; she is now on gemtesa and feels like it works very well during the day, but not at not. When she gets out of bed in the morning she immediately starts  leaking, even with pad on. She had steroid treatment for colitis for 18 months and feels like this made her urinary symptoms worse. When she has a good bowel movement in the morning, she feels like urinary symptoms are better.  Fluid intake: Yes: drinks decaf ice tea throughout the day; tries to get fluids in morning and afternoon (6-8 glasses)    PAIN:  Are you having pain? No   PRECAUTIONS: None  WEIGHT BEARING RESTRICTIONS: No  FALLS:  Has patient fallen in last 6 months? No  LIVING ENVIRONMENT: Lives with: lives alone Lives in: House/apartment  OCCUPATION: Retired Marine scientist  PLOF: Independent  PATIENT GOALS: improve overactive bladder symptoms, especially at night  PERTINENT HISTORY:  Collagenous colitis, Back pain, diverticulitis, thyroid disease, abdominal hysterectomy 79, Rt hip pain managed by exercise Sexual abuse: No  BOWEL MOVEMENT: Pain with bowel movement: No Type of bowel movement:Frequency 1-3x/day and Strain No Fully empty rectum: Yes: but then can have another BM 45 minutes later Leakage: Yes: occasionally when stool is loose Pads: Yes: - Fiber supplement: No Taking Miralax daily for constipation; feels like constipation and bloating makes incontinence worse.   URINATION: Pain with urination: No Fully empty bladder: Yes: - Stream: Strong Urgency: Yes: better now, worst in the morning or after prolonged sitting Frequency: 4x/day Leakage: Urge to void, Walking to the bathroom, Coughing, Sneezing, and Laughing - better since starting new medication Pads: Yes: number 4 Always, 2-3 a day, and then longer pad at night  INTERCOURSE: Pain with intercourse:  not sexually active right now  - never pain in the past Ability to have vaginal penetration:  Yes: NA Climax: yes Marinoff Scale: 0/3  PREGNANCY: Vaginal deliveries 2 Tearing Yes: 2 episiotomies C-section deliveries 0 Currently pregnant No  PROLAPSE: None   OBJECTIVE:   07/23/22 PATIENT SURVEYS:    [id number]  COGNITION: Overall cognitive status: Within functional limits for tasks assessed     SENSATION: Light touch: Appears intact Proprioception: Appears intact  FUNCTIONAL TESTS:   Rt 6 seconds, Lt 8 seconds GAIT:  Comments: decreased hip extension  POSTURE: rounded shoulders, forward head, decreased lumbar lordosis, increased thoracic kyphosis, posterior pelvic tilt, and elevated Lt shoulder/Lt iliac crest  LUMBARAROM/PROM:  A/PROM A/PROM  Eval (%)  Flexion 75  Extension 50, stiffness in low back  Right lateral flexion 75  Left lateral flexion 75  Right rotation 75  Left rotation 75   (Blank rows = not tested)  LOWER EXTREMITY MMT: Grossly 4/5 hip strength   PALPATION:   General  WNL                External Perineal Exam NA                             Internal Pelvic Floor NA  Patient confirms identification and approves PT to assess internal pelvic floor and treatment Yes most likely next treatment session  PELVIC MMT:   MMT eval  Vaginal   Internal Anal Sphincter   External Anal Sphincter   Puborectalis   Diastasis Recti   (Blank rows = not tested)        TONE: NA  PROLAPSE: NA  TODAY'S TREATMENT:                                                                                                                              DATE: 07/23/22  EVAL  Self-care: Bowel massage Fiber supplement Voiding schedule 2.5 hours    PATIENT EDUCATION:  Education details: See above self-care Person educated: Patient Education method: Explanation, Demonstration, Tactile cues, Verbal cues, and Handouts Education comprehension: verbalized understanding  HOME EXERCISE PROGRAM: Written handout  ASSESSMENT:  CLINICAL IMPRESSION: Patient is a 81 y.o. female who was seen today for physical therapy evaluation and treatment for urinary incontinence. Exam findings notable for abnormal posture, decreased lumbar A/ROM, and impaired balance; will plan to  perform internal pelvic floor assessment next treatment session. Signs and symptoms from objective/subjective findings are most consistent with decreased pelvic floor and core weakness and consistent bowel dysfunction. Initial treatment include bowel massage, fiber supplementation, and voiding schedule education. She will benefit from skilled PT intervention in order to decrease urinary urgency/incontinence and improve QOL.   OBJECTIVE IMPAIRMENTS: decreased activity tolerance, decreased coordination, decreased endurance, decreased strength, and postural dysfunction.   ACTIVITY LIMITATIONS: continence  PARTICIPATION LIMITATIONS: community activity  PERSONAL FACTORS: 3+ comorbidities: Collagenous colitis, Back pain, diverticulitis, thyroid disease, abdominal hysterectomy 79, Rt hip pain managed by exercise  are also affecting patient's functional outcome.   REHAB POTENTIAL: Good  CLINICAL DECISION MAKING: Stable/uncomplicated  EVALUATION COMPLEXITY: Low   GOALS: Goals reviewed with patient? Yes  SHORT TERM GOALS: Target date: 08/20/2022  Pt will be independent with HEP.   Baseline: Goal status: INITIAL  2.  Pt will increase all impaired lumbar A/ROM by 25% without pain. Baseline:  Goal status: INITIAL  3.  Pt will be independent with use of squatty potty, relaxed toileting mechanics, and improved bowel movement techniques in order to increase ease of bowel movements and complete evacuation.   Baseline:  Goal status: INITIAL  4.  Pt will be independent with the knack, urge suppression technique, and double voiding in order to improve bladder habits and decrease urinary incontinence.   Baseline:  Goal status: INITIAL   LONG TERM GOALS: Target date: 09/17/2022   Pt will be independent with advanced HEP.   Baseline:  Goal status: INITIAL  2.  Pt will demonstrate normal pelvic floor muscle tone and A/ROM, able to achieve 3/5 strength with contractions and 10 sec endurance,  in order to provide appropriate lumbopelvic support in functional activities.   Baseline:  Goal status: INITIAL  3.  Pt will report no episodes of urinary incontinence in order to improve confidence in community activities and personal hygiene.   Baseline:  Goal status: INITIAL  4.  Pt  will be able to stand/get up in the morning without any urinary urgency or leaking. Baseline:  Goal status: INITIAL    PLAN:  PT FREQUENCY: 1x/week  PT DURATION: 8 weeks  PLANNED INTERVENTIONS: Therapeutic exercises, Therapeutic activity, Neuromuscular re-education, Balance training, Gait training, Patient/Family education, Self Care, Joint mobilization, Dry Needling, Biofeedback, and Manual therapy  PLAN FOR NEXT SESSION: Internal pelvic floor assessment; pelvic floor strengthening; TA training.   Heather Roberts, PT, DPT10/31/2310:31 AM

## 2022-07-23 ENCOUNTER — Ambulatory Visit: Payer: Medicare Other | Attending: Obstetrics & Gynecology

## 2022-07-23 ENCOUNTER — Other Ambulatory Visit: Payer: Self-pay

## 2022-07-23 DIAGNOSIS — M6281 Muscle weakness (generalized): Secondary | ICD-10-CM | POA: Diagnosis not present

## 2022-07-23 DIAGNOSIS — R279 Unspecified lack of coordination: Secondary | ICD-10-CM | POA: Diagnosis not present

## 2022-07-23 DIAGNOSIS — R293 Abnormal posture: Secondary | ICD-10-CM | POA: Insufficient documentation

## 2022-07-23 DIAGNOSIS — Z79899 Other long term (current) drug therapy: Secondary | ICD-10-CM | POA: Insufficient documentation

## 2022-07-23 DIAGNOSIS — N3281 Overactive bladder: Secondary | ICD-10-CM | POA: Insufficient documentation

## 2022-07-23 NOTE — Patient Instructions (Signed)
Bowel massage: To assist with more regular and more comfortable bowel movements, try performing bowel massage nightly for 5-10 minutes. Place hands in the lower right side of your abdomen to start; in small circles, massage up, across, and down the left side of your abdomen. Pressure does not need to be hard, but just comfortable. You can use lotion or oil to make more comfortable.    Fiber supplement: Psyllium husk/Metamucil   Try to go to the bathroom every 2.5 hours throughout the day.

## 2022-07-29 NOTE — Therapy (Signed)
OUTPATIENT PHYSICAL THERAPY TREATMENT NOTE   Patient Name: [name] MRN: [id number] DOB:[birth year], 81 y.o., female Today's Date: 07/30/2022  PCP: Michael Boston, MD REFERRING PROVIDER: Megan Salon, MD  END OF SESSION:   PT End of Session - 07/30/22 1521     Visit Number 2    Date for PT Re-Evaluation 09/17/22    Authorization Type [employer] Medicare    PT Start Time 1430    PT Stop Time 1508    PT Time Calculation (min) 38 min    Activity Tolerance Patient tolerated treatment well    Behavior During Therapy [facility] for tasks assessed/performed             Past Medical History:  Diagnosis Date   Arthritis    Back pain    had cortisone injection 6/15, Dr Durward Fortes   Carotid artery occlusion    Carotid bruit    Colitis, collagenous    Diverticulosis    GERD (gastroesophageal reflux disease)    Hyperlipidemia    Hypertension    Injury of left leg 06/05/12   Pt. fell   Peripheral vascular disease ([place])    Shingles outbreak 2017   Thyroid disease 02-27-12   lt. thyroid nodule, bx. done 5 yrs ago-now some enlargement is seen.   Past Surgical History:  Procedure Laterality Date   ABDOMINAL HYSTERECTOMY  01/1980   CAROTID ENDARTERECTOMY  1998   Stable since surgery -slight build up returning-follows with yrly Dopplers   CATARACT EXTRACTION, BILATERAL  2004 or 2005 per pt   COLONOSCOPY     EYE SURGERY  02-27-12   Bil. Cataract surgery   THYROID LOBECTOMY  03/02/2012   Procedure: THYROID LOBECTOMY;  Surgeon: Earnstine Regal, MD;  Location: [facility];  Service: General;  Laterality: Left;   Patient Active Problem List   Diagnosis Date Noted   Unilateral primary osteoarthritis, right knee 02/19/2022   Encounter for general adult medical examination without abnormal findings 10/15/2021   Allergic rhinitis 01/11/2021   Chronic kidney disease, stage 3a ([place]) 01/11/2021   Collagenous colitis 01/11/2021   Hardening of the aorta (main artery of the heart) ([place]) 01/11/2021    Hyperlipidemia 01/11/2021   Primary localized osteoarthritis of pelvic region and thigh 01/11/2021   Urinary incontinence 01/11/2021   Unilateral primary osteoarthritis, right hip 05/02/2020   Low back pain 05/02/2020   Gaseous regurgitation- burping 07/26/2019   Other fatigue 07/26/2019   Irritable bowel syndrome type symptoms at this time with diarrhea 07/26/2019   Vertigo 03/24/2019   Glucose intolerance (impaired glucose tolerance) 02/17/2019   Vitamin D deficiency 09/30/2016   Obesity, Class I, BMI 30-34.9 09/30/2016   h/o Diverticulosis 06/07/2016   GERD (gastroesophageal reflux disease) 06/07/2016   Environmental and seasonal allergies 06/07/2016   Arthritis 06/07/2016   Moderate osteopenia 06/07/2016   Bilateral carotid artery disease ([place]) 06/10/2012   Hypothyroidism, postsurgical 05/05/2012   Dyslipidemia- low HDL and inc VLDL & TG 04/04/2011   Benign hypertensive heart disease without heart failure 04/04/2011    REFERRING DIAG: N32.81 (ICD-10-CM) - OAB (overactive bladder)  THERAPY DIAG:  Abnormal posture  Muscle weakness (generalized)  Unspecified lack of coordination  Rationale for Evaluation and Treatment Rehabilitation  PERTINENT HISTORY: Collagenous colitis, Back pain, diverticulitis, thyroid disease, abdominal hysterectomy 79, Rt hip pain managed by exercise  PRECAUTIONS: NA  SUBJECTIVE:  SUBJECTIVE STATEMENT:  Pt states that she has not seem much change so far. She has only had one episode of early morning gush when she got up. She has been performing bowel massage. She is also working on going to the bathroom more frequently   PAIN:  Are you having pain? No   07/23/22 SUBJECTIVE STATEMENT: Pt states that she has had several years of stress urinary incontinence with  laughing/coughing. But now, if she sits and plays bridge for 2 or 3 ours, she would stand up and gush urine out; she had to start wearing thicker pads at that time. A year ago she started trialling different medications for overactive bladder; she is now on gemtesa and feels like it works very well during the day, but not at not. When she gets out of bed in the morning she immediately starts leaking, even with pad on. She had steroid treatment for colitis for 18 months and feels like this made her urinary symptoms worse. When she has a good bowel movement in the morning, she feels like urinary symptoms are better.  Fluid intake: Yes: drinks decaf ice tea throughout the day; tries to get fluids in morning and afternoon (6-8 glasses)     PAIN:  Are you having pain? No     PRECAUTIONS: None   WEIGHT BEARING RESTRICTIONS: No   FALLS:  Has patient fallen in last 6 months? No   LIVING ENVIRONMENT: Lives with: lives alone Lives in: House/apartment   OCCUPATION: Retired Marine scientist   PLOF: Independent   PATIENT GOALS: improve overactive bladder symptoms, especially at night   PERTINENT HISTORY:  Collagenous colitis, Back pain, diverticulitis, thyroid disease, abdominal hysterectomy 79, Rt hip pain managed by exercise Sexual abuse: No   BOWEL MOVEMENT: Pain with bowel movement: No Type of bowel movement:Frequency 1-3x/day and Strain No Fully empty rectum: Yes: but then can have another BM 45 minutes later Leakage: Yes: occasionally when stool is loose Pads: Yes: - Fiber supplement: No Taking Miralax daily for constipation; feels like constipation and bloating makes incontinence worse.    URINATION: Pain with urination: No Fully empty bladder: Yes: - Stream: Strong Urgency: Yes: better now, worst in the morning or after prolonged sitting Frequency: 4x/day Leakage: Urge to void, Walking to the bathroom, Coughing, Sneezing, and Laughing - better since starting new medication Pads: Yes:  number 4 Always, 2-3 a day, and then longer pad at night   INTERCOURSE: Pain with intercourse:  not sexually active right now  - never pain in the past Ability to have vaginal penetration:  Yes: NA Climax: yes Marinoff Scale: 0/3   PREGNANCY: Vaginal deliveries 2 Tearing Yes: 2 episiotomies C-section deliveries 0 Currently pregnant No   PROLAPSE: None     OBJECTIVE:  07/30/22: PALPATION:   General  WNL                 External Perineal Exam Dryness, skin irritation                             Internal Pelvic Floor Dryness and tenderness in superficial pelvic floor/surrounding introitus, most notable at posterior fourchette   Patient confirms identification and approves PT to assess internal pelvic floor and treatment Yes    PELVIC MMT:   MMT eval  Vaginal  2/5, 2 seconds, 3 repetitions - poor coordination with abdominal/gluteal co-contraction and breath holding  Internal Anal Sphincter    External Anal Sphincter  Puborectalis    Diastasis Recti    (Blank rows = not tested)         TONE: WNL    PROLAPSE: none detected this session      07/23/22 PATIENT SURVEYS:    [id number]   COGNITION: Overall cognitive status: Within functional limits for tasks assessed                          SENSATION: Light touch: Appears intact Proprioception: Appears intact   FUNCTIONAL TESTS:              Rt 6 seconds, Lt 8 seconds GAIT:   Comments: decreased hip extension   POSTURE: rounded shoulders, forward head, decreased lumbar lordosis, increased thoracic kyphosis, posterior pelvic tilt, and elevated Lt shoulder/Lt iliac crest   LUMBARAROM/PROM:   A/PROM A/PROM  Eval (%)  Flexion 75  Extension 50, stiffness in low back  Right lateral flexion 75  Left lateral flexion 75  Right rotation 75  Left rotation 75   (Blank rows = not tested)   LOWER EXTREMITY MMT: Grossly 4/5 hip strength      TODAY'S TREATMENT 07/30/22:  Manual: Internal pelvic floor  techniques: No emotional/communication barriers or cognitive limitation. Patient is motivated to learn. Patient understands and agrees with treatment goals and plan. PT explains patient will be examined in standing, sitting, and lying down to see how their muscles and joints work. When they are ready, they will be asked to remove their underwear so PT can examine their perineum. The patient is also given the option of providing their own chaperone as one is not provided in our facility. The patient also has the right and is explained the right to defer or refuse any part of the evaluation or treatment including the internal exam. With the patient's consent, PT will use one gloved finger to gently assess the muscles of the pelvic floor, seeing how well it contracts and relaxes and if there is muscle symmetry. After, the patient will get dressed and PT and patient will discuss exam findings and plan of care. PT and patient discuss plan of care, schedule, attendance policy and HEP activities. Internal vaginal assessment of pelvic floor Neuromuscular re-education: Pelvic floor contraction training: Pelvic floor contraction training Quick flicks 01X Long holds 5 x 10 seconds Urge suppression technique training Self-care: Vulvovaginal massage Talking with MD about estrogen cream Talking with pharmacist about questions in regarding timing of fiber The knack Urge suppression technique                                                                                                                              DATE: 07/23/22  EVAL  Self-care: Bowel massage Fiber supplement Voiding schedule 2.5 hours       PATIENT EDUCATION:  Education details: See above self-care Person educated: Patient Education method: Explanation, Demonstration, Tactile cues, Verbal cues, and Handouts Education comprehension: verbalized  understanding   HOME EXERCISE PROGRAM: [id number]   ASSESSMENT:   CLINICAL  IMPRESSION: Pt is beginning to see some amount of progress demonstrated by only one episode of leaking in the mornings in the last week. She consented to internal pelvic floor assessment and demonstrated weakness and difficulty with motor control of pelvic floor contraction. Contraction training performed with breath coordination with excellent improvements. HEP given with quick flicks and long holds. We discussed the urge suppression technique and the knack to help improve urgency in the mornings/after prolonged sitting and the instances of increased abdominal pressure. She did well with practice of these technique using internal tactile cues for feedback. She will continue to benefit from skilled PT intervention in order to decrease urinary urgency/incontinence and improve QOL.    OBJECTIVE IMPAIRMENTS: decreased activity tolerance, decreased coordination, decreased endurance, decreased strength, and postural dysfunction.    ACTIVITY LIMITATIONS: continence   PARTICIPATION LIMITATIONS: community activity   PERSONAL FACTORS: 3+ comorbidities: Collagenous colitis, Back pain, diverticulitis, thyroid disease, abdominal hysterectomy 79, Rt hip pain managed by exercise  are also affecting patient's functional outcome.    REHAB POTENTIAL: Good   CLINICAL DECISION MAKING: Stable/uncomplicated   EVALUATION COMPLEXITY: Low     GOALS: Goals reviewed with patient? Yes   SHORT TERM GOALS: Target date: 08/20/2022   Pt will be independent with HEP.    Baseline: Goal status: INITIAL   2.  Pt will increase all impaired lumbar A/ROM by 25% without pain. Baseline:  Goal status: INITIAL   3.  Pt will be independent with use of squatty potty, relaxed toileting mechanics, and improved bowel movement techniques in order to increase ease of bowel movements and complete evacuation.    Baseline:  Goal status: INITIAL   4.  Pt will be independent with the knack, urge suppression technique, and double  voiding in order to improve bladder habits and decrease urinary incontinence.    Baseline:  Goal status: INITIAL     LONG TERM GOALS: Target date: 09/17/2022    Pt will be independent with advanced HEP.    Baseline:  Goal status: INITIAL   2.  Pt will demonstrate normal pelvic floor muscle tone and A/ROM, able to achieve 3/5 strength with contractions and 10 sec endurance, in order to provide appropriate lumbopelvic support in functional activities.    Baseline:  Goal status: INITIAL   3.  Pt will report no episodes of urinary incontinence in order to improve confidence in community activities and personal hygiene.    Baseline:  Goal status: INITIAL   4.  Pt will be able to stand/get up in the morning without any urinary urgency or leaking. Baseline:  Goal status: INITIAL       PLAN:   PT FREQUENCY: 1x/week   PT DURATION: 8 weeks   PLANNED INTERVENTIONS: Therapeutic exercises, Therapeutic activity, Neuromuscular re-education, Balance training, Gait training, Patient/Family education, Self Care, Joint mobilization, Dry Needling, Biofeedback, and Manual therapy   PLAN FOR NEXT SESSION: TA training; how is the urge suppression technique going? Did she hear from MD about estrogen cream?   Heather Roberts, PT, DPT11/07/233:23 PM

## 2022-07-30 ENCOUNTER — Other Ambulatory Visit: Payer: Self-pay | Admitting: Interventional Cardiology

## 2022-07-30 ENCOUNTER — Ambulatory Visit: Payer: Medicare Other | Attending: Obstetrics & Gynecology

## 2022-07-30 DIAGNOSIS — M62838 Other muscle spasm: Secondary | ICD-10-CM | POA: Diagnosis present

## 2022-07-30 DIAGNOSIS — R293 Abnormal posture: Secondary | ICD-10-CM | POA: Insufficient documentation

## 2022-07-30 DIAGNOSIS — M6281 Muscle weakness (generalized): Secondary | ICD-10-CM | POA: Insufficient documentation

## 2022-07-30 DIAGNOSIS — R279 Unspecified lack of coordination: Secondary | ICD-10-CM | POA: Diagnosis present

## 2022-07-30 NOTE — Patient Instructions (Signed)
Vulvar/vaginal Massage: This is a technique to help decrease painful sensitivity in the vaginal area. It can also help to restore normal moisture levels in the vaginal tissues. With coconut oil, aloe, jojoba oil, or a specific vaginal moisturizer, gently massage into vaginal tissues. Think of this as part of your post-shower routine and moisturizing just like you would the rest of the body with lotion. This helps to increase good blood flow to the vaginal tissues. In addition, it also teaches the body that touch to the vagina does not have to be painful or threatening, but moisturizing and gentle.     Urge suppression technique: A technique to help you hold urine until it's an appropriate time to go, whether this is making it home or trying to reach a specific voiding time frame according to your schedule. It helps to send signals from the bladder to the brain that say you don't actually have to void urine right now. This most likely only give you several minutes of relief at first, but repeat as needed; the benefit will last longer as you use this technique more and get into better bladder habits. ?  The technique: o Perform 5 quick flicks (Kegels) rapidly, not worrying about fully relaxing in between each (only in this technique). o Then perform several deep belly breaths while focusing on relaxing the pelvic floor. o Go do something else to help distract yourself from the urge to urinate. o Repeat as needed.     [facility] [address], [place], [place] [address] Phone # [phone] Fax [phone]

## 2022-07-31 ENCOUNTER — Other Ambulatory Visit (HOSPITAL_BASED_OUTPATIENT_CLINIC_OR_DEPARTMENT_OTHER): Payer: Self-pay | Admitting: Obstetrics & Gynecology

## 2022-07-31 ENCOUNTER — Telehealth (HOSPITAL_BASED_OUTPATIENT_CLINIC_OR_DEPARTMENT_OTHER): Payer: Self-pay | Admitting: *Deleted

## 2022-07-31 MED ORDER — ESTRADIOL 0.1 MG/GM VA CREA: TOPICAL_CREAM | VAGINAL | 1 refills | Status: DC

## 2022-07-31 NOTE — Telephone Encounter (Signed)
-----   Message from Megan Salon, MD sent at 07/31/2022 10:40 AM EST ----- Regarding: prescription for estrogen cream Ashley Royalty, one of the pelvic PT providers sent me a message to see if I would prescribe some topical estrogen cream for this pt.  Is she doing PT to see if can help with urinary incontinence.  She has some superficial skin irritation from pads and being wet and Cyril Mourning thought a little estrogen would help.  I reviewed the chart and do not think it is contracindicated with this pt.  Can you call her and let know I sent in a prescription?  She should use a small amount externally nightly for the next week and then use only twice weekly.  She can apply topical aquaphor the other days.  This will help to give a barrier to the skin as well.  Please double check with pt that she's never had a DVT or PE.  Also, let her know the package insert will have all kinds of warnings like breast cancer and clot risk but this is required by the FDA because it is an estrogen.  So they use the same warning for oral HRT as they do for topical estrogen cream.  The risks with the estrogen cream are minimal because she will be using a small amount just externally and only twice weekly.  Thank you.  Vinnie Level

## 2022-07-31 NOTE — Telephone Encounter (Signed)
LMOVM for pt to return my call.  

## 2022-08-01 ENCOUNTER — Telehealth (HOSPITAL_BASED_OUTPATIENT_CLINIC_OR_DEPARTMENT_OTHER): Payer: Self-pay | Admitting: *Deleted

## 2022-08-01 NOTE — Therapy (Signed)
OUTPATIENT PHYSICAL THERAPY TREATMENT NOTE   Patient Name: [name] MRN: [id number] DOB:March 28, 1941, 81 y.o., female Today's Date: 08/05/2022  PCP: Michael Boston, MD REFERRING PROVIDER: Megan Salon, MD  END OF SESSION:   PT End of Session - 08/05/22 1101     Visit Number 3    Date for PT Re-Evaluation 09/17/22    Authorization Type [employer] Medicare    PT Start Time 1100    PT Stop Time 1140    PT Time Calculation (min) 40 min    Activity Tolerance Patient tolerated treatment well    Behavior During Therapy [facility] for tasks assessed/performed              Past Medical History:  Diagnosis Date   Arthritis    Back pain    had cortisone injection 6/15, Dr Durward Fortes   Carotid artery occlusion    Carotid bruit    Colitis, collagenous    Diverticulosis    GERD (gastroesophageal reflux disease)    Hyperlipidemia    Hypertension    Injury of left leg 06/05/12   Pt. fell   Peripheral vascular disease ([place])    Shingles outbreak 2017   Thyroid disease 02-27-12   lt. thyroid nodule, bx. done 5 yrs ago-now some enlargement is seen.   Past Surgical History:  Procedure Laterality Date   ABDOMINAL HYSTERECTOMY  01/1980   CAROTID ENDARTERECTOMY  1998   Stable since surgery -slight build up returning-follows with yrly Dopplers   CATARACT EXTRACTION, BILATERAL  2004 or 2005 per pt   COLONOSCOPY     EYE SURGERY  02-27-12   Bil. Cataract surgery   THYROID LOBECTOMY  03/02/2012   Procedure: THYROID LOBECTOMY;  Surgeon: Earnstine Regal, MD;  Location: [facility];  Service: General;  Laterality: Left;   Patient Active Problem List   Diagnosis Date Noted   Unilateral primary osteoarthritis, right knee 02/19/2022   Encounter for general adult medical examination without abnormal findings 10/15/2021   Allergic rhinitis 01/11/2021   Chronic kidney disease, stage 3a ([place]) 01/11/2021   Collagenous colitis 01/11/2021   Hardening of the aorta (main artery of the heart) ([place]) 01/11/2021    Hyperlipidemia 01/11/2021   Primary localized osteoarthritis of pelvic region and thigh 01/11/2021   Urinary incontinence 01/11/2021   Unilateral primary osteoarthritis, right hip 05/02/2020   Low back pain 05/02/2020   Gaseous regurgitation- burping 07/26/2019   Other fatigue 07/26/2019   Irritable bowel syndrome type symptoms at this time with diarrhea 07/26/2019   Vertigo 03/24/2019   Glucose intolerance (impaired glucose tolerance) 02/17/2019   Vitamin D deficiency 09/30/2016   Obesity, Class I, BMI 30-34.9 09/30/2016   h/o Diverticulosis 06/07/2016   GERD (gastroesophageal reflux disease) 06/07/2016   Environmental and seasonal allergies 06/07/2016   Arthritis 06/07/2016   Moderate osteopenia 06/07/2016   Bilateral carotid artery disease ([place]) 06/10/2012   Hypothyroidism, postsurgical 05/05/2012   Dyslipidemia- low HDL and inc VLDL & TG 04/04/2011   Benign hypertensive heart disease without heart failure 04/04/2011    REFERRING DIAG: N32.81 (ICD-10-CM) - OAB (overactive bladder)  THERAPY DIAG:  Abnormal posture  Muscle weakness (generalized)  Other muscle spasm  Unspecified lack of coordination  Rationale for Evaluation and Treatment Rehabilitation  PERTINENT HISTORY: Collagenous colitis, Back pain, diverticulitis, thyroid disease, abdominal hysterectomy 79, Rt hip pain managed by exercise  PRECAUTIONS: NA  SUBJECTIVE:  SUBJECTIVE STATEMENT:  Pt states that she is doing better - she has not had any episodes of jumping out of bed and having urine run down her leg. She has been working on exercises regularly, but notices she has a difficult time with full relaxation. She feels like content of bowel movement is better with fiber, but it makes her feel like she has to have a bowel movement all the  time, so she is going to cut back on fiber. She feels like the flow of urine is better.   PAIN:  Are you having pain? No   07/23/22 SUBJECTIVE STATEMENT: Pt states that she has had several years of stress urinary incontinence with laughing/coughing. But now, if she sits and plays bridge for 2 or 3 ours, she would stand up and gush urine out; she had to start wearing thicker pads at that time. A year ago she started trialling different medications for overactive bladder; she is now on gemtesa and feels like it works very well during the day, but not at not. When she gets out of bed in the morning she immediately starts leaking, even with pad on. She had steroid treatment for colitis for 18 months and feels like this made her urinary symptoms worse. When she has a good bowel movement in the morning, she feels like urinary symptoms are better.  Fluid intake: Yes: drinks decaf ice tea throughout the day; tries to get fluids in morning and afternoon (6-8 glasses)     PAIN:  Are you having pain? No     PRECAUTIONS: None   WEIGHT BEARING RESTRICTIONS: No   FALLS:  Has patient fallen in last 6 months? No   LIVING ENVIRONMENT: Lives with: lives alone Lives in: House/apartment   OCCUPATION: Retired Marine scientist   PLOF: Independent   PATIENT GOALS: improve overactive bladder symptoms, especially at night   PERTINENT HISTORY:  Collagenous colitis, Back pain, diverticulitis, thyroid disease, abdominal hysterectomy 79, Rt hip pain managed by exercise Sexual abuse: No   BOWEL MOVEMENT: Pain with bowel movement: No Type of bowel movement:Frequency 1-3x/day and Strain No Fully empty rectum: Yes: but then can have another BM 45 minutes later Leakage: Yes: occasionally when stool is loose Pads: Yes: - Fiber supplement: No Taking Miralax daily for constipation; feels like constipation and bloating makes incontinence worse.    URINATION: Pain with urination: No Fully empty bladder: Yes: - Stream:  Strong Urgency: Yes: better now, worst in the morning or after prolonged sitting Frequency: 4x/day Leakage: Urge to void, Walking to the bathroom, Coughing, Sneezing, and Laughing - better since starting new medication Pads: Yes: number 4 Always, 2-3 a day, and then longer pad at night   INTERCOURSE: Pain with intercourse:  not sexually active right now  - never pain in the past Ability to have vaginal penetration:  Yes: NA Climax: yes Marinoff Scale: 0/3   PREGNANCY: Vaginal deliveries 2 Tearing Yes: 2 episiotomies C-section deliveries 0 Currently pregnant No   PROLAPSE: None     OBJECTIVE:  07/30/22: PALPATION:   General  WNL                 External Perineal Exam Dryness, skin irritation                             Internal Pelvic Floor Dryness and tenderness in superficial pelvic floor/surrounding introitus, most notable at posterior fourchette   Patient confirms identification and approves  PT to assess internal pelvic floor and treatment Yes    PELVIC MMT:   MMT eval  Vaginal  2/5, 2 seconds, 3 repetitions - poor coordination with abdominal/gluteal co-contraction and breath holding  Internal Anal Sphincter    External Anal Sphincter    Puborectalis    Diastasis Recti    (Blank rows = not tested)         TONE: WNL    PROLAPSE: none detected this session      07/23/22 PATIENT SURVEYS:    [id number]   COGNITION: Overall cognitive status: Within functional limits for tasks assessed                          SENSATION: Light touch: Appears intact Proprioception: Appears intact   FUNCTIONAL TESTS:              Rt 6 seconds, Lt 8 seconds GAIT:   Comments: decreased hip extension   POSTURE: rounded shoulders, forward head, decreased lumbar lordosis, increased thoracic kyphosis, posterior pelvic tilt, and elevated Lt shoulder/Lt iliac crest   LUMBARAROM/PROM:   A/PROM A/PROM  Eval (%)  Flexion 75  Extension 50, stiffness in low back  Right  lateral flexion 75  Left lateral flexion 75  Right rotation 75  Left rotation 75   (Blank rows = not tested)   LOWER EXTREMITY MMT: Grossly 4/5 hip strength      TODAY'S TREATMENT 08/05/22: Neuromuscular re-education: Core retraining:  Transversus abdominus training with multimodal cues for improved motor control and breath coordination Core facilitation: Supine march 2 x 10 Supine leg slides 5x bil Seated march 2 x 10 (bil out front holding) Pelvic floor contraction training: Quick flicks Wide stance 98X Staggered stance bil 10x Exercises: Strengthening: Bridge with hip adduction ball squeeze 2 x 10 supine; 5x seated Clam shells 2 x 10 bil    TREATMENT 07/30/22:  Manual: Internal pelvic floor techniques: No emotional/communication barriers or cognitive limitation. Patient is motivated to learn. Patient understands and agrees with treatment goals and plan. PT explains patient will be examined in standing, sitting, and lying down to see how their muscles and joints work. When they are ready, they will be asked to remove their underwear so PT can examine their perineum. The patient is also given the option of providing their own chaperone as one is not provided in our facility. The patient also has the right and is explained the right to defer or refuse any part of the evaluation or treatment including the internal exam. With the patient's consent, PT will use one gloved finger to gently assess the muscles of the pelvic floor, seeing how well it contracts and relaxes and if there is muscle symmetry. After, the patient will get dressed and PT and patient will discuss exam findings and plan of care. PT and patient discuss plan of care, schedule, attendance policy and HEP activities. Internal vaginal assessment of pelvic floor Neuromuscular re-education: Pelvic floor contraction training: Pelvic floor contraction training Quick flicks 21J Long holds 5 x 10 seconds Urge suppression  technique training Self-care: Vulvovaginal massage Talking with MD about estrogen cream Talking with pharmacist about questions in regarding timing of fiber The knack Urge suppression technique  DATE: 07/23/22  EVAL  Self-care: Bowel massage Fiber supplement Voiding schedule 2.5 hours       PATIENT EDUCATION:  Education details: See above self-care Person educated: Patient Education method: Explanation, Demonstration, Tactile cues, Verbal cues, and Handouts Education comprehension: verbalized understanding   HOME EXERCISE PROGRAM: [email or web address]   ASSESSMENT:   CLINICAL IMPRESSION: Pt is seeing excellent progress without any episodes of full urinary incontinence in the morning when she wakes to go to the bathroom. We reviewed pelvic floor contractions and progressed the standing positions to include wide and staggered stance. She did very well with core training and strengthening progressions. She was able to begin functional hip strengthening with core/pelvic floor contractions and appropriate breath coordination. She will continue to benefit from skilled PT intervention in order to decrease urinary urgency/incontinence and improve QOL.    OBJECTIVE IMPAIRMENTS: decreased activity tolerance, decreased coordination, decreased endurance, decreased strength, and postural dysfunction.    ACTIVITY LIMITATIONS: continence   PARTICIPATION LIMITATIONS: community activity   PERSONAL FACTORS: 3+ comorbidities: Collagenous colitis, Back pain, diverticulitis, thyroid disease, abdominal hysterectomy 79, Rt hip pain managed by exercise  are also affecting patient's functional outcome.    REHAB POTENTIAL: Good   CLINICAL DECISION MAKING: Stable/uncomplicated   EVALUATION COMPLEXITY: Low     GOALS: Goals reviewed with patient? Yes   SHORT TERM GOALS: Target date:  08/20/2022   Pt will be independent with HEP.    Baseline: Goal status: INITIAL   2.  Pt will increase all impaired lumbar A/ROM by 25% without pain. Baseline:  Goal status: INITIAL   3.  Pt will be independent with use of squatty potty, relaxed toileting mechanics, and improved bowel movement techniques in order to increase ease of bowel movements and complete evacuation.    Baseline:  Goal status: INITIAL   4.  Pt will be independent with the knack, urge suppression technique, and double voiding in order to improve bladder habits and decrease urinary incontinence.    Baseline:  Goal status: INITIAL     LONG TERM GOALS: Target date: 09/17/2022    Pt will be independent with advanced HEP.    Baseline:  Goal status: INITIAL   2.  Pt will demonstrate normal pelvic floor muscle tone and A/ROM, able to achieve 3/5 strength with contractions and 10 sec endurance, in order to provide appropriate lumbopelvic support in functional activities.    Baseline:  Goal status: INITIAL   3.  Pt will report no episodes of urinary incontinence in order to improve confidence in community activities and personal hygiene.    Baseline:  Goal status: INITIAL   4.  Pt will be able to stand/get up in the morning without any urinary urgency or leaking. Baseline:  Goal status: INITIAL       PLAN:   PT FREQUENCY: 1x/week   PT DURATION: 8 weeks   PLANNED INTERVENTIONS: Therapeutic exercises, Therapeutic activity, Neuromuscular re-education, Balance training, Gait training, Patient/Family education, Self Care, Joint mobilization, Dry Needling, Biofeedback, and Manual therapy   PLAN FOR NEXT SESSION: progress to more functional strengthening activities; possible discharge/possibly schedule 1 more appointment in December (cancelling one on 27th?)   Ruthell Feigenbaum, PT, DPT11/13/2311:39 AM

## 2022-08-01 NOTE — Telephone Encounter (Signed)
-----   Message from Megan Salon, MD sent at 07/31/2022 10:40 AM EST ----- Regarding: prescription for estrogen cream [name], one of the pelvic PT providers sent me a message to see if I would prescribe some topical estrogen cream for this pt.  Is she doing PT to see if can help with urinary incontinence.  She has some superficial skin irritation from pads and being wet and Cyril Mourning thought a little estrogen would help.  I reviewed the chart and do not think it is contracindicated with this pt.  Can you call her and let know I sent in a prescription?  She should use a small amount externally nightly for the next week and then use only twice weekly.  She can apply topical aquaphor the other days.  This will help to give a barrier to the skin as well.  Please double check with pt that she's never had a DVT or PE.  Also, let her know the package insert will have all kinds of warnings like breast cancer and clot risk but this is required by the FDA because it is an estrogen.  So they use the same warning for oral HRT as they do for topical estrogen cream.  The risks with the estrogen cream are minimal because she will be using a small amount just externally and only twice weekly.  Thank you.  Vinnie Level

## 2022-08-01 NOTE — Telephone Encounter (Signed)
Called pt to advised that Pelvic Floor PT requested that Dr. Sabra Heck send a prescription for topical estrogen cream to help with the skin irritation that she is experiencing. Advised that Rx was sent to pharmacy and she should use externally, nightly for the next week and then twice weekly. Pt has picked up prescription and started use last night. Pt has never had a DVT or PE. Advised that warnings included with Rx may say increases risk of breast cancer and clots but that is required by the FDA because it is an estrogen product. Advised that the risk with the cream are minimal because she is using a small amount externally only twice weekly. Pt verbalized understanding.

## 2022-08-05 ENCOUNTER — Ambulatory Visit: Payer: Medicare Other

## 2022-08-05 DIAGNOSIS — M62838 Other muscle spasm: Secondary | ICD-10-CM

## 2022-08-05 DIAGNOSIS — R293 Abnormal posture: Secondary | ICD-10-CM | POA: Diagnosis not present

## 2022-08-05 DIAGNOSIS — R279 Unspecified lack of coordination: Secondary | ICD-10-CM

## 2022-08-05 DIAGNOSIS — M6281 Muscle weakness (generalized): Secondary | ICD-10-CM

## 2022-08-12 ENCOUNTER — Ambulatory Visit: Payer: Medicare Other

## 2022-08-12 DIAGNOSIS — R293 Abnormal posture: Secondary | ICD-10-CM

## 2022-08-12 DIAGNOSIS — M6281 Muscle weakness (generalized): Secondary | ICD-10-CM

## 2022-08-12 DIAGNOSIS — R279 Unspecified lack of coordination: Secondary | ICD-10-CM

## 2022-08-12 NOTE — Therapy (Signed)
OUTPATIENT PHYSICAL THERAPY TREATMENT NOTE   Patient Name: [name] MRN: [id number] DOB:[birth year], 81 y.o., female Today's Date: 08/12/2022  PCP: Michael Boston, MD REFERRING PROVIDER: Megan Salon, MD  END OF SESSION:   PT End of Session - 08/12/22 1051     Visit Number 4    Date for PT Re-Evaluation 09/17/22    Authorization Type [employer] Medicare    PT Start Time 1100    PT Stop Time 1140    PT Time Calculation (min) 40 min    Activity Tolerance Patient tolerated treatment well    Behavior During Therapy [facility] for tasks assessed/performed              Past Medical History:  Diagnosis Date   Arthritis    Back pain    had cortisone injection 6/15, Dr Durward Fortes   Carotid artery occlusion    Carotid bruit    Colitis, collagenous    Diverticulosis    GERD (gastroesophageal reflux disease)    Hyperlipidemia    Hypertension    Injury of left leg 06/05/12   Pt. fell   Peripheral vascular disease ([place])    Shingles outbreak 2017   Thyroid disease 02-27-12   lt. thyroid nodule, bx. done 5 yrs ago-now some enlargement is seen.   Past Surgical History:  Procedure Laterality Date   ABDOMINAL HYSTERECTOMY  01/1980   CAROTID ENDARTERECTOMY  1998   Stable since surgery -slight build up returning-follows with yrly Dopplers   CATARACT EXTRACTION, BILATERAL  2004 or 2005 per pt   COLONOSCOPY     EYE SURGERY  02-27-12   Bil. Cataract surgery   THYROID LOBECTOMY  03/02/2012   Procedure: THYROID LOBECTOMY;  Surgeon: Earnstine Regal, MD;  Location: [facility];  Service: General;  Laterality: Left;   Patient Active Problem List   Diagnosis Date Noted   Unilateral primary osteoarthritis, right knee 02/19/2022   Encounter for general adult medical examination without abnormal findings 10/15/2021   Allergic rhinitis 01/11/2021   Chronic kidney disease, stage 3a ([place]) 01/11/2021   Collagenous colitis 01/11/2021   Hardening of the aorta (main artery of the heart) ([place]) 01/11/2021    Hyperlipidemia 01/11/2021   Primary localized osteoarthritis of pelvic region and thigh 01/11/2021   Urinary incontinence 01/11/2021   Unilateral primary osteoarthritis, right hip 05/02/2020   Low back pain 05/02/2020   Gaseous regurgitation- burping 07/26/2019   Other fatigue 07/26/2019   Irritable bowel syndrome type symptoms at this time with diarrhea 07/26/2019   Vertigo 03/24/2019   Glucose intolerance (impaired glucose tolerance) 02/17/2019   Vitamin D deficiency 09/30/2016   Obesity, Class I, BMI 30-34.9 09/30/2016   h/o Diverticulosis 06/07/2016   GERD (gastroesophageal reflux disease) 06/07/2016   Environmental and seasonal allergies 06/07/2016   Arthritis 06/07/2016   Moderate osteopenia 06/07/2016   Bilateral carotid artery disease ([place]) 06/10/2012   Hypothyroidism, postsurgical 05/05/2012   Dyslipidemia- low HDL and inc VLDL & TG 04/04/2011   Benign hypertensive heart disease without heart failure 04/04/2011    REFERRING DIAG: N32.81 (ICD-10-CM) - OAB (overactive bladder)  THERAPY DIAG:  Abnormal posture  Muscle weakness (generalized)  Unspecified lack of coordination  Rationale for Evaluation and Treatment Rehabilitation  PERTINENT HISTORY: Collagenous colitis, Back pain, diverticulitis, thyroid disease, abdominal hysterectomy 79, Rt hip pain managed by exercise  PRECAUTIONS: NA  SUBJECTIVE:  SUBJECTIVE STATEMENT:  Pt reports progress and she has not had any episodes of urine running down her leg in the morning. She has been working on exercises regularly. [place] feel like her low back pain is improving some.    PAIN:  Are you having pain? No   07/23/22 SUBJECTIVE STATEMENT: Pt states that she has had several years of stress urinary incontinence with laughing/coughing. But now,  if she sits and plays bridge for 2 or 3 ours, she would stand up and gush urine out; she had to start wearing thicker pads at that time. A year ago she started trialling different medications for overactive bladder; she is now on gemtesa and feels like it works very well during the day, but not at not. When she gets out of bed in the morning she immediately starts leaking, even with pad on. She had steroid treatment for colitis for 18 months and feels like this made her urinary symptoms worse. When she has a good bowel movement in the morning, she feels like urinary symptoms are better.  Fluid intake: Yes: drinks decaf ice tea throughout the day; tries to get fluids in morning and afternoon (6-8 glasses)     PAIN:  Are you having pain? No     PRECAUTIONS: None   WEIGHT BEARING RESTRICTIONS: No   FALLS:  Has patient fallen in last 6 months? No   LIVING ENVIRONMENT: Lives with: lives alone Lives in: House/apartment   OCCUPATION: Retired Marine scientist   PLOF: Independent   PATIENT GOALS: improve overactive bladder symptoms, especially at night   PERTINENT HISTORY:  Collagenous colitis, Back pain, diverticulitis, thyroid disease, abdominal hysterectomy 79, Rt hip pain managed by exercise Sexual abuse: No   BOWEL MOVEMENT: Pain with bowel movement: No Type of bowel movement:Frequency 1-3x/day and Strain No Fully empty rectum: Yes: but then can have another BM 45 minutes later Leakage: Yes: occasionally when stool is loose Pads: Yes: - Fiber supplement: No Taking Miralax daily for constipation; feels like constipation and bloating makes incontinence worse.    URINATION: Pain with urination: No Fully empty bladder: Yes: - Stream: Strong Urgency: Yes: better now, worst in the morning or after prolonged sitting Frequency: 4x/day Leakage: Urge to void, Walking to the bathroom, Coughing, Sneezing, and Laughing - better since starting new medication Pads: Yes: number 4 Always, 2-3 a day, and  then longer pad at night   INTERCOURSE: Pain with intercourse:  not sexually active right now  - never pain in the past Ability to have vaginal penetration:  Yes: NA Climax: yes Marinoff Scale: 0/3   PREGNANCY: Vaginal deliveries 2 Tearing Yes: 2 episiotomies C-section deliveries 0 Currently pregnant No   PROLAPSE: None     OBJECTIVE:  07/30/22: PALPATION:   General  WNL                 External Perineal Exam Dryness, skin irritation                             Internal Pelvic Floor Dryness and tenderness in superficial pelvic floor/surrounding introitus, most notable at posterior fourchette   Patient confirms identification and approves PT to assess internal pelvic floor and treatment Yes    PELVIC MMT:   MMT eval  Vaginal  2/5, 2 seconds, 3 repetitions - poor coordination with abdominal/gluteal co-contraction and breath holding  Internal Anal Sphincter    External Anal Sphincter    Puborectalis  Diastasis Recti    (Blank rows = not tested)         TONE: WNL    PROLAPSE: none detected this session      07/23/22 PATIENT SURVEYS:    [id number]   COGNITION: Overall cognitive status: Within functional limits for tasks assessed                          SENSATION: Light touch: Appears intact Proprioception: Appears intact   FUNCTIONAL TESTS:              Rt 6 seconds, Lt 8 seconds GAIT:   Comments: decreased hip extension   POSTURE: rounded shoulders, forward head, decreased lumbar lordosis, increased thoracic kyphosis, posterior pelvic tilt, and elevated Lt shoulder/Lt iliac crest   LUMBARAROM/PROM:   A/PROM A/PROM  Eval (%) A/ROM 08/12/22 (%)  Flexion 75 100  Extension 50, stiffness in low back 75  Right lateral flexion 75 WNL  Left lateral flexion 75 WNL  Right rotation 75 WNL  Left rotation 75 WNL   (Blank rows = not tested)   LOWER EXTREMITY MMT: Grossly 4/5 hip strength      TODAY'S TREATMENT 08/12/22: Neuromuscular  re-education: Core facilitation: D2 PNF 10x bil yellow band Standing march 2 x 10 with bil UE anterior Unilateral bent row 10x bil, 5 lbs Pallof press 10x bil red band  Exercises: Strengthening: Squat to chair 10x with breath coordination Sidestepping 2 x 10 Hell raises 2 x 10    TREATMENT 08/05/22: Neuromuscular re-education: Core retraining:  Transversus abdominus training with multimodal cues for improved motor control and breath coordination Core facilitation: Supine march 2 x 10 Supine leg slides 5x bil Seated march 2 x 10 (bil out front holding) Pelvic floor contraction training: Quick flicks Wide stance 23F Staggered stance bil 10x Exercises: Strengthening: Bridge with hip adduction ball squeeze 2 x 10 supine; 5x seated Clam shells 2 x 10 bil    TREATMENT 07/30/22:  Manual: Internal pelvic floor techniques: No emotional/communication barriers or cognitive limitation. Patient is motivated to learn. Patient understands and agrees with treatment goals and plan. PT explains patient will be examined in standing, sitting, and lying down to see how their muscles and joints work. When they are ready, they will be asked to remove their underwear so PT can examine their perineum. The patient is also given the option of providing their own chaperone as one is not provided in our facility. The patient also has the right and is explained the right to defer or refuse any part of the evaluation or treatment including the internal exam. With the patient's consent, PT will use one gloved finger to gently assess the muscles of the pelvic floor, seeing how well it contracts and relaxes and if there is muscle symmetry. After, the patient will get dressed and PT and patient will discuss exam findings and plan of care. PT and patient discuss plan of care, schedule, attendance policy and HEP activities. Internal vaginal assessment of pelvic floor Neuromuscular re-education: Pelvic floor  contraction training: Pelvic floor contraction training Quick flicks 57D Long holds 5 x 10 seconds Urge suppression technique training Self-care: Vulvovaginal massage Talking with MD about estrogen cream Talking with pharmacist about questions in regarding timing of fiber The knack Urge suppression technique  PATIENT EDUCATION:  Education details: See above self-care Person educated: Patient Education method: Explanation, Demonstration, Tactile cues, Verbal cues, and Handouts Education comprehension: verbalized understanding   HOME EXERCISE PROGRAM: [id number]   ASSESSMENT:   CLINICAL IMPRESSION: Patient is overall doing very well with no symptoms of morning incontinence and only one small episode when getting up from a chair in which she was able to stop. She was able to progress all functional strengthening exercises with good tolerance and motor control of core/pelvic floor. She had to slow down in order to give herself reminders for correct breath coordination, but able to do so. Due to progress and patient request, she is prepared to D/C at this time.    OBJECTIVE IMPAIRMENTS: decreased activity tolerance, decreased coordination, decreased endurance, decreased strength, and postural dysfunction.    ACTIVITY LIMITATIONS: continence   PARTICIPATION LIMITATIONS: community activity   PERSONAL FACTORS: 3+ comorbidities: Collagenous colitis, Back pain, diverticulitis, thyroid disease, abdominal hysterectomy 79, Rt hip pain managed by exercise  are also affecting patient's functional outcome.    REHAB POTENTIAL: Good   CLINICAL DECISION MAKING: Stable/uncomplicated   EVALUATION COMPLEXITY: Low     GOALS: Goals reviewed with patient? Yes   SHORT TERM GOALS: Target date: 08/20/2022 - updated 08/12/22   Pt will be independent with HEP.     Baseline: Goal status: MET   2.  Pt will increase all impaired lumbar A/ROM by 25% without pain. Baseline:  Goal status: MET   3.  Pt will be independent with use of squatty potty, relaxed toileting mechanics, and improved bowel movement techniques in order to increase ease of bowel movements and complete evacuation.    Baseline:  Goal status: MET   4.  Pt will be independent with the knack, urge suppression technique, and double voiding in order to improve bladder habits and decrease urinary incontinence.    Baseline:  Goal status: MET     LONG TERM GOALS: Target date: 09/17/2022 - 08/12/22   Pt will be independent with advanced HEP.    Baseline:  Goal status: MET   2.  Pt will demonstrate normal pelvic floor muscle tone and A/ROM, able to achieve 3/5 strength with contractions and 10 sec endurance, in order to provide appropriate lumbopelvic support in functional activities.    Baseline:  Goal status: MET - strength gains demonstrated by significant improvement in symptoms   3.  Pt will report no episodes of urinary incontinence in order to improve confidence in community activities and personal hygiene.    Baseline: 1x - but she states this was small and stopped with urge suppression technique Goal status: MET   4.  Pt will be able to stand/get up in the morning without any urinary urgency or leaking. Baseline:  Goal status: MET       PLAN:   PT FREQUENCY: 1x/week   PT DURATION: 8 weeks   PLANNED INTERVENTIONS: Therapeutic exercises, Therapeutic activity, Neuromuscular re-education, Balance training, Gait training, Patient/Family education, Self Care, Joint mobilization, Dry Needling, Biofeedback, and Manual therapy   PLAN FOR NEXT SESSION: progress to more functional strengthening activities; possible discharge/possibly schedule 1 more appointment in December (cancelling one on 27th?)   Janziel Hockett, PT, DPT11/20/2311:43 AM  PHYSICAL THERAPY DISCHARGE  SUMMARY  Visits from Start of Care: 4  Current functional level related to goals / functional outcomes: MET   Remaining deficits: See above   Education / Equipment: HEP   Patient agrees to discharge. Patient goals were met. Patient is  being discharged due to meeting the stated rehab goals.

## 2022-08-14 ENCOUNTER — Other Ambulatory Visit: Payer: Self-pay | Admitting: *Deleted

## 2022-08-14 ENCOUNTER — Other Ambulatory Visit: Payer: Self-pay | Admitting: Interventional Cardiology

## 2022-08-14 DIAGNOSIS — I6523 Occlusion and stenosis of bilateral carotid arteries: Secondary | ICD-10-CM

## 2022-08-26 ENCOUNTER — Encounter: Payer: Self-pay | Admitting: Surgery

## 2022-08-26 ENCOUNTER — Ambulatory Visit: Payer: Medicare Other | Admitting: Surgery

## 2022-08-26 ENCOUNTER — Ambulatory Visit (HOSPITAL_COMMUNITY)
Admission: RE | Admit: 2022-08-26 | Discharge: 2022-08-26 | Disposition: A | Discharge status: Home / Self Care | Payer: Medicare Other | Source: Ambulatory Visit | Attending: Surgery | Admitting: Surgery

## 2022-08-26 VITALS — BP 145/80 | HR 62 | Temp 97.9°F | Resp 20 | Ht 63.0 in | Wt 186.0 lb

## 2022-08-26 DIAGNOSIS — I6523 Occlusion and stenosis of bilateral carotid arteries: Secondary | ICD-10-CM | POA: Insufficient documentation

## 2022-08-26 NOTE — Progress Notes (Signed)
[facility]  Patient name: [name] MRN: [id number] DOB: 05/04/1941 Sex: female   REASON FOR VISIT:    Follow up  HISOTRY OF PRESENT ILLNESS:    [name] is a 81 y.o. female with history of right carotid endarterectomy by Dr. Amedeo Plenty in 1998.  I saw her 6 months ago for progression of stenosis on ultrasound.  She had a CT scan that showed 40% right-sided stenosis and 60% left.  She remains asymptomatic.  Specifically, she denies numbness or weakness in either extremity.  She denies slurred speech.  She denies amaurosis fugax.  She is back today for ultrasound  The patient is a former smoker.  She is medically managed for hypertension.  She takes a statin for hypercholesterolemia. PAST MEDICAL HISTORY:   Past Medical History:  Diagnosis Date   Arthritis    Back pain    had cortisone injection 6/15, Dr Durward Fortes   Carotid artery occlusion    Carotid bruit    Colitis, collagenous    Diverticulosis    GERD (gastroesophageal reflux disease)    Hyperlipidemia    Hypertension    Injury of left leg 06/05/12   Pt. fell   Peripheral vascular disease ([place])    Shingles outbreak 2017   Thyroid disease 02-27-12   lt. thyroid nodule, bx. done 5 yrs ago-now some enlargement is seen.     FAMILY HISTORY:   Family History  Problem Relation Age of Onset   Heart disease Mother    Hypertension Mother    Arthritis Mother    Stroke Mother    Hyperlipidemia Mother    Atrial fibrillation Mother    Heart disease Brother        before age 25   Heart attack Neg Hx    Breast cancer Neg Hx     SOCIAL HISTORY:   Social History   Tobacco Use   Smoking status: Former    Types: Cigarettes    Quit date: 09/24/1975    Years since quitting: 46.9   Smokeless tobacco: Never  Substance Use Topics   Alcohol use: Yes    Alcohol/week: 1.0 standard drink of alcohol    Types: 1 Glasses of wine per week     ALLERGIES:    Allergies  Allergen Reactions   Zebeta Other (See Comments)    Does not work, per patient.   Ace Inhibitors Other (See Comments)    cough     CURRENT MEDICATIONS:   Current Outpatient Medications  Medication Sig Dispense Refill   acetaminophen (TYLENOL) 500 MG tablet Take 1,000 mg by mouth 2 (two) times daily.     amLODipine (NORVASC) 2.5 MG tablet TAKE 1 TABLET BY MOUTH DAILY WITH BREAKFAST 90 tablet 3   aspirin 81 MG EC tablet Take 81 mg by mouth daily.     B Complex Vitamins (VITAMIN-B COMPLEX PO) Take 1 tablet by mouth daily with breakfast.     Cholecalciferol (VITAMIN D) 50 MCG (2000 UT) tablet Take 4,000 Units by mouth daily.     estradiol (ESTRACE) 0.1 MG/GM vaginal cream Apply to skin externally nightly for the next 7 days and then decrease to using twice weekly. 42.5 g 1   famotidine (PEPCID) 20 MG tablet Take 1 tablet by mouth as needed.     furosemide (LASIX) 40 MG tablet Take 1 tablet (40 mg total) by mouth daily as needed for edema. 30 tablet 6   hydrochlorothiazide (HYDRODIURIL) 25 MG tablet  Take 1 tablet by mouth daily. 90 tablet 3   KRILL OIL PO Take 500 mg by mouth daily. MEGA RED     levothyroxine (SYNTHROID) 112 MCG tablet Take 1 tablet (112 mcg total) by mouth daily. 90 tablet 3   melatonin 5 MG TABS Take 10 mg by mouth at bedtime.     metoprolol succinate (TOPROL-XL) 100 MG 24 hr tablet Take 1 tablet (100 mg total) by mouth daily. 90 tablet 3   mometasone (ELOCON) 0.1 % cream Apply 1 application topically daily. 45 g 1   Multiple Vitamin (MULTIVITAMIN) tablet Take 1 tablet by mouth daily.       mupirocin ointment (BACTROBAN) 2 % SMARTSIG:1 Application Topical 2-3 Times Daily     Psyllium (METAMUCIL PO) Take 1 Capful by mouth daily.     rosuvastatin (CRESTOR) 20 MG tablet TAKE 1 TABLET BY MOUTH DAILY 90 tablet 3   telmisartan (MICARDIS) 80 MG tablet TAKE 1 TABLET BY MOUTH DAILY 90 tablet 3   traMADol (ULTRAM) 50 MG tablet Take 1 tablet (50 mg total) by mouth  every 12 (twelve) hours as needed. 30 tablet 0   verapamil (CALAN-SR) 240 MG CR tablet TAKE 1 TABLET BY MOUTH DAILY WITH BREAKFAST 90 tablet 3   Vibegron (GEMTESA) 75 MG TABS Take 1 tablet by mouth daily. 90 tablet 4   No current facility-administered medications for this visit.    REVIEW OF SYSTEMS:   '[X]'$  denotes positive finding, '[ ]'$  denotes negative finding Cardiac  Comments:  Chest pain or chest pressure:    Shortness of breath upon exertion:    Short of breath when lying flat:    Irregular heart rhythm:        Vascular    Pain in calf, thigh, or hip brought on by ambulation:    Pain in feet at night that wakes you up from your sleep:     Blood clot in your veins:    Leg swelling:         Pulmonary    Oxygen at home:    Productive cough:     Wheezing:         Neurologic    Sudden weakness in arms or legs:     Sudden numbness in arms or legs:     Sudden onset of difficulty speaking or slurred speech:    Temporary loss of vision in one eye:     Problems with dizziness:         Gastrointestinal    Blood in stool:     Vomited blood:         Genitourinary    Burning when urinating:     Blood in urine:        Psychiatric    Major depression:         Hematologic    Bleeding problems:    Problems with blood clotting too easily:        Skin    Rashes or ulcers:        Constitutional    Fever or chills:      PHYSICAL EXAM:   Vitals:   08/26/22 1225 08/26/22 1228  BP: 135/81 (!) 145/80  Pulse: 62   Resp: 20   Temp: 97.9 F (36.6 C)   SpO2: 95%   Weight: 186 lb (84.4 kg)   Height: '5\' 3"'$  (1.6 m)     GENERAL: The patient is a well-nourished female, in no acute distress. The vital signs are  documented above. PULMONARY: Non-labored respirations MUSCULOSKELETAL: There are no major deformities or cyanosis. NEUROLOGIC: No focal weakness or paresthesias are detected. SKIN: There are no ulcers or rashes noted. PSYCHIATRIC: The patient has a normal  affect.  STUDIES:   I reviewed the following studies: Carotid ultrasound: Right Carotid: Velocities in the right ICA are consistent with a 60-79%                 stenosis.   Left Carotid: Velocities in the left ICA are consistent with a 40-59%  stenosis.               The ECA appears >50% stenosed.   Vertebrals:  Bilateral vertebral arteries demonstrate antegrade flow.  Subclavians: Normal flow hemodynamics were seen in bilateral subclavian               arteries.   MEDICAL ISSUES:   Carotid stenosis: I suspect that her ultrasound numbers are slightly elevated given that this is what prompted her to have a CT scan 6 months ago which showed 40% right-sided stenosis and 60% left-sided stenosis.  Regardless, she remains asymptomatic.  Therefore no surgical intervention is recommended.  She will continue medical therapy.  I will have her come back in 1 year for repeat carotid ultrasound    Annamarie Major, IV, MD, FACS [facility] [phone] Pager [phone]

## 2022-11-01 ENCOUNTER — Other Ambulatory Visit: Payer: Self-pay | Admitting: Internal Medicine

## 2022-11-01 DIAGNOSIS — M8580 Other specified disorders of bone density and structure, unspecified site: Secondary | ICD-10-CM

## 2023-01-31 ENCOUNTER — Other Ambulatory Visit (HOSPITAL_COMMUNITY): Payer: Self-pay | Admitting: *Deleted

## 2023-02-03 ENCOUNTER — Ambulatory Visit (HOSPITAL_COMMUNITY)
Admission: RE | Admit: 2023-02-03 | Discharge: 2023-02-03 | Disposition: A | Discharge status: Home / Self Care | Payer: Medicare Other | Source: Ambulatory Visit | Attending: Internal Medicine | Admitting: Internal Medicine

## 2023-02-03 DIAGNOSIS — M810 Age-related osteoporosis without current pathological fracture: Secondary | ICD-10-CM | POA: Insufficient documentation

## 2023-02-03 MED ORDER — ZOLEDRONIC ACID 5 MG/100ML IV SOLN
5.0000 mg | Freq: Once | INTRAVENOUS | Status: AC
  Administered 2023-02-03: 5 mg via INTRAVENOUS

## 2023-02-03 MED ORDER — ZOLEDRONIC ACID 5 MG/100ML IV SOLN
INTRAVENOUS | Status: AC
  Filled 2023-02-03: qty 100

## 2023-03-10 ENCOUNTER — Other Ambulatory Visit: Payer: Self-pay | Admitting: Internal Medicine

## 2023-03-10 DIAGNOSIS — Z1231 Encounter for screening mammogram for malignant neoplasm of breast: Secondary | ICD-10-CM

## 2023-03-19 NOTE — Progress Notes (Unsigned)
Cardiology Office Note   Date:  03/20/2023   ID:  [name], DOB 06-08-1941, MRN [id number]  PCP:  Melida Quitter, MD    No chief complaint on file.  Hypertensive heart disease  Wt Readings from Last 3 Encounters:  03/20/23 183 lb 12.8 oz (83.4 kg)  08/26/22 186 lb (84.4 kg)  07/01/22 185 lb 3.2 oz (84 kg)       History of Present Illness: [name] is a 82 y.o. female  who has a past history of dyslipidemia.  She was previously followed by Dr. Patty Sermons.  She has a history of known residual right carotid bruit after remote right carotid endarterectomy.   She had a normal nuclear stress test in April 06, 2004. She has had essential hypertension and obesity.    Her thyroid disease is followed by her GYN physician. She follows with vascular surgery regarding her carotid disease.   Chronic GERD sx.   In Jul 15, 2020stress test: Nuclear stress EF: 77%. The left ventricular ejection fraction is hyperdynamic (>65%). There was no ST segment deviation noted during stress. Defect 1: There is a small defect of mild severity present in the basal anterior, mid anterior and apical anterior location. This appears to be most consistent with breast attenuation This is a low risk study. There is no evidence of ischemia or previous infarction. The study is normal.   In July 15, 2020she  Had some dizziness with turning:"had an episode of vertigo Monday assoc with N/V and elevated BP. EMS ran an EKG which was normal. High salt diet the night before. Under a lot of stress with her husband in hospice care.  Patient would like to proceed with echo and stress test to rule out cardiac source. Increase HCTZ to 25 mg 1 1/2 daily.F/U with Dr. Eldridge Dace after testing."  BP was high at that time.     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 Lorelle Pham, who had metastatic prostate cancer.  He passed away in 07-Apr-2019.     She had her COVID vaccines.   She has taken a steroid for collagenous colitis.  When she tapered off, it flares up.    Tested positive for Covid in 7/23. Isolated and took [employer] antiviral. Planning on taking RSV, Covid and flu shots. Had persistent cough in Apr 06, 2022.  BP has improved.  Some dizziness when she takes all of her meds.    Had COVID in 4/24.  Took Paxlovid.  Had a rash a week af andter this.   Past Medical History:  Diagnosis Date   Arthritis    Back pain    had cortisone injection 6/15, Dr Cleophas Dunker   Carotid artery occlusion    Carotid bruit    Colitis, collagenous    Diverticulosis    GERD (gastroesophageal reflux disease)    Hyperlipidemia    Hypertension    Injury of left leg 06/05/12   Pt. fell   Peripheral vascular disease (HCC)    Shingles outbreak 04-06-16   Thyroid disease 02-27-12   lt. thyroid nodule, bx. done 5 yrs ago-now some enlargement is seen.    Past Surgical History:  Procedure Laterality Date   ABDOMINAL HYSTERECTOMY  01/1980   CAROTID ENDARTERECTOMY  1998   Stable since surgery -slight build up returning-follows with yrly Dopplers   CATARACT EXTRACTION, BILATERAL  April 07, 2003 or 04/06/04 per pt   COLONOSCOPY     EYE SURGERY  02-27-12   Bil. Cataract surgery  THYROID LOBECTOMY  03/02/2012   Procedure: THYROID LOBECTOMY;  Surgeon: Velora Heckler, MD;  Location: [facility];  Service: General;  Laterality: Left;     Current Outpatient Medications  Medication Sig Dispense Refill   acetaminophen (TYLENOL) 500 MG tablet Take 1,000 mg by mouth 2 (two) times daily.     amLODipine (NORVASC) 2.5 MG tablet TAKE 1 TABLET BY MOUTH DAILY WITH BREAKFAST 90 tablet 3   aspirin 81 MG EC tablet Take 81 mg by mouth daily.     B Complex Vitamins (VITAMIN-B COMPLEX PO) Take 1 tablet by mouth daily with breakfast.     Cholecalciferol (VITAMIN D) 50 MCG (2000 UT) tablet Take 4,000 Units by mouth daily.     estradiol (ESTRACE) 0.1 MG/GM vaginal cream Apply to skin externally nightly for the next 7 days and then decrease to using twice weekly. 42.5 g 1   famotidine  (PEPCID) 20 MG tablet Take 1 tablet by mouth as needed.     furosemide (LASIX) 40 MG tablet Take 1 tablet (40 mg total) by mouth daily as needed for edema. 30 tablet 6   hydrochlorothiazide (HYDRODIURIL) 25 MG tablet Take 1 tablet by mouth daily. 90 tablet 3   leflunomide (ARAVA) 20 MG tablet Take 20 mg by mouth daily.     tiZANidine (ZANAFLEX) 2 MG tablet Take 2 mg by mouth 3 (three) times daily.     triamcinolone cream (KENALOG) 0.1 % Apply topically 2 (two) times daily.     KRILL OIL PO Take 500 mg by mouth daily. MEGA RED     levothyroxine (SYNTHROID) 112 MCG tablet Take 1 tablet (112 mcg total) by mouth daily. 90 tablet 3   melatonin 5 MG TABS Take 10 mg by mouth at bedtime.     metoprolol succinate (TOPROL-XL) 100 MG 24 hr tablet Take 1 tablet (100 mg total) by mouth daily. 90 tablet 3   mometasone (ELOCON) 0.1 % cream Apply 1 application topically daily. 45 g 1   Multiple Vitamin (MULTIVITAMIN) tablet Take 1 tablet by mouth daily.       rosuvastatin (CRESTOR) 20 MG tablet TAKE 1 TABLET BY MOUTH DAILY 90 tablet 3   telmisartan (MICARDIS) 80 MG tablet TAKE 1 TABLET BY MOUTH DAILY 90 tablet 3   verapamil (CALAN-SR) 240 MG CR tablet TAKE 1 TABLET BY MOUTH DAILY WITH BREAKFAST 90 tablet 3   Vibegron (GEMTESA) 75 MG TABS Take 1 tablet by mouth daily. 90 tablet 4   No current facility-administered medications for this visit.    Allergies:   Zebeta, Ace inhibitors, and Hydroxychloroquine sulfate    Social History:  The patient  reports that she quit smoking about 47 years ago. Her smoking use included cigarettes. She has never used smokeless tobacco. She reports current alcohol use of about 1.0 standard drink of alcohol per week. She reports that she does not use drugs.   Family History:  The patient's family history includes Arthritis in her mother; Atrial fibrillation in her mother; Heart disease in her brother and mother; Hyperlipidemia in her mother; Hypertension in her mother; Stroke in  her mother.    ROS:  Please see the history of present illness.   Otherwise, review of systems are positive for *residual rash on right leg.   All other systems are reviewed and negative.    PHYSICAL EXAM: VS:  BP 124/80   Pulse 83   Ht 5\' 3"  (1.6 m)   Wt 183 lb 12.8 oz (83.4 kg)  LMP 09/24/1979   SpO2 95%   BMI 32.56 kg/m  , BMI Body mass index is 32.56 kg/m. GEN: Well nourished, well developed, in no acute distress HEENT: normal Neck: no JVD, bilateral carotid bruits, no masses Cardiac: RRR; no murmurs, rubs, or gallops,no edema  Respiratory:  clear to auscultation bilaterally, normal work of breathing GI: soft, nontender, nondistended, + BS MS: no deformity or atrophy Skin: warm and dry;rash on right leg Neuro:  Strength and sensation are intact Psych: euthymic mood, full affect   EKG:   The ekg ordered today demonstrates NSR, no ST changes   Recent Labs: No results found for requested labs within last 365 days.   Lipid Panel    Component Value Date/Time   CHOL 172 06/15/2020 1210   TRIG 131 06/15/2020 1210   HDL 50 06/15/2020 1210   CHOLHDL 3.4 06/15/2020 1210   CHOLHDL 3.9 08/26/2016 1035   VLDL 32 (H) 08/26/2016 1035   LDLCALC 99 06/15/2020 1210   LDLDIRECT 73.4 01/07/2013 0939     Other studies Reviewed: Additional studies/ records that were reviewed today with results demonstrating: labs reviewed.   ASSESSMENT AND PLAN:  Hypertensive heart disease: The current medical regimen is effective;  continue present plan and medications.  Highest reading is in the 150s, no severe spikes.  Hyperlipidemia: Whole food, plant-based diet.  Continue rosuvastatin.  LDL 71 in 7/24. Carotid artery disease: Followed by [facility]. Hypothyroidism: Managed by primary medical doctor.   Current medicines are reviewed at length with the patient today.  The patient concerns regarding her medicines were addressed.  The following changes have been made:  No change  Labs/ tests  ordered today include:  No orders of the defined types were placed in this encounter.   Recommend 150 minutes/week of aerobic exercise Low fat, low carb, high fiber diet recommended  Disposition:   FU in 6 months   Signed, Lance Muss, MD  03/20/2023 12:23 PM    [facility] [address] [place], [place], [place]  [address] Phone: [phone]; Fax: [phone]

## 2023-03-20 ENCOUNTER — Ambulatory Visit: Payer: Medicare Other | Attending: Interventional Cardiology | Admitting: Interventional Cardiology

## 2023-03-20 ENCOUNTER — Encounter: Payer: Self-pay | Admitting: Interventional Cardiology

## 2023-03-20 VITALS — BP 124/80 | HR 83 | Ht 63.0 in | Wt 183.8 lb

## 2023-03-20 DIAGNOSIS — I6523 Occlusion and stenosis of bilateral carotid arteries: Secondary | ICD-10-CM

## 2023-03-20 DIAGNOSIS — I119 Hypertensive heart disease without heart failure: Secondary | ICD-10-CM

## 2023-03-20 DIAGNOSIS — E785 Hyperlipidemia, unspecified: Secondary | ICD-10-CM

## 2023-03-20 NOTE — Patient Instructions (Signed)
Medication Instructions:  Your physician recommends that you continue on your current medications as directed. Please refer to the Current Medication list given to you today.  *If you need a refill on your cardiac medications before your next appointment, please call your pharmacy*   Lab Work: none If you have labs (blood work) drawn today and your tests are completely normal, you will receive your results only by: MyChart Message (if you have MyChart) OR A paper copy in the mail If you have any lab test that is abnormal or we need to change your treatment, we will call you to review the results.   Testing/Procedures: none   Follow-Up: At [facility], you and your health needs are our priority.  As part of our continuing mission to provide you with exceptional heart care, we have created designated Provider Care Teams.  These Care Teams include your primary Cardiologist (physician) and Advanced Practice Providers (APPs -  Physician Assistants and Nurse Practitioners) who all work together to provide you with the care you need, when you need it.  We recommend signing up for the patient portal called "MyChart".  Sign up information is provided on this After Visit Summary.  MyChart is used to connect with patients for Virtual Visits (Telemedicine).  Patients are able to view lab/test results, encounter notes, upcoming appointments, etc.  Non-urgent messages can be sent to your provider as well.   To learn more about what you can do with MyChart, go to [email or web address].    Your next appointment:   6 month(s)  Provider:   Jayadeep Varanasi, MD     Other Instructions    

## 2023-03-24 ENCOUNTER — Encounter: Payer: Self-pay | Admitting: Orthopedic Surgery

## 2023-03-24 ENCOUNTER — Ambulatory Visit: Payer: Medicare Other | Admitting: Orthopedic Surgery

## 2023-03-24 ENCOUNTER — Other Ambulatory Visit (INDEPENDENT_AMBULATORY_CARE_PROVIDER_SITE_OTHER): Payer: Medicare Other

## 2023-03-24 VITALS — BP 108/67 | HR 61 | Ht 63.0 in | Wt 184.0 lb

## 2023-03-24 DIAGNOSIS — M5416 Radiculopathy, lumbar region: Secondary | ICD-10-CM

## 2023-03-24 NOTE — Progress Notes (Signed)
Orthopedic Spine Surgery Office Note  Assessment: Patient is a 82 y.o. female with low back pain that radiates into the bilateral posterior thighs that improves with lumbar flexion, suspect neurogenic claudication.  Has anterolisthesis at L4/5   Plan: -Explained that initially conservative treatment is tried as a significant number of patients may experience relief with these treatment modalities. Discussed that the conservative treatments include:  -activity modification  -physical therapy  -over the counter pain medications  -medrol dosepak  -lumbar steroid injections -Patient has tried PT, Tylenol, lumbar steroid injections -Since she has gotten good and lasting relief with prior lumbar steroid injections, recommended a repeat injection.  Referral was provided to her today -Patient should return to office on an as-needed basis   Patient expressed understanding of the plan and all questions were answered to the patient's satisfaction.   ___________________________________________________________________________   History:  Patient is a 82 y.o. female who presents today for lumbar spine.  Patient has had about 2 years of low back pain that radiates into the bilateral posterior thighs.  She says it has gotten worse within the last 3 months.  She notes that if she walks for more than 2 or 3 minutes.  She says that it gets better if she sits down.  She does not have any significant pain when seated.  She also notes he gets better if she leans over a shopping cart at the store.  She says that she is gotten significant relief that last for several months with lumbar injections.  No pain radiating past the knee on either side.   Weakness: denies Symptoms of imbalance: denies Paresthesias and numbness: denies Bowel or bladder incontinence: Yes, has a several year history of urinary incontinence that is worse with coughing or sneezing.  Has been prescribed medication.  No recent changes.  No  bowel incontinence. Saddle anesthesia: Denies  Treatments tried: PT, Tylenol, lumbar steroid injections  Review of systems: Denies fevers and chills, night sweats, unexplained weight loss, history of cancer, pain that wakes them at night  Past medical history: Osteopenia HLD HTN CKD GERD RA Chronic pain Collagenous colitis   Allergies: zebeta, ACEi, hydroxychloroquine  Past surgical history:  Carotid endarterectomy Bilateral cataract surgery Thyroid lobectomy Hysterectomy  Social history: Denies use of nicotine product (smoking, vaping, patches, smokeless) Alcohol use: social Denies recreational drug use   Physical Exam:  BMI of 32.6  General: no acute distress, appears stated age Neurologic: alert, answering questions appropriately, following commands Respiratory: unlabored breathing on room air, symmetric chest rise Psychiatric: appropriate affect, normal cadence to speech   MSK (spine):  -Strength exam      Left  Right EHL    5/5  5/5 TA    5/5  5/5 GSC    5/5  5/5 Knee extension  5/5  5/5 Hip flexion   5/5  5/5  -Sensory exam    Sensation intact to light touch in L3-S1 nerve distributions of bilateral lower extremities  -Straight leg raise: negative bilaterally -Femoral nerve stretch test: negative bilaterally -Clonus: no beats bilaterally  -Left hip exam: no pain through range of motion, negative stinchfield, negative faber -Right hip exam: positive fadir, no pain through remainder of range of motion, negative stinchfield, negative faber  Imaging: XR of the lumbar spine from 03/24/2023 was independently reviewed and interpreted, showing lumbar degenerative scoliosis with apex to the right. Asymmetric disc height loss at L3/4. Spondylolisthesis at L4/5. Disc height loss with anterior osteophyte formation at L2/3, L3/4, L4/5. No  fracture or dislocation seen.   MRI of the lumbar spine from 06/21/2021 was independently reviewed and interpreted,  showing L4/5 anterolisthesis. Lateral recess stenosis at L4/5. DDD at L2/3, L3/4, L4/5.    Patient name: [name] Patient MRN: [id number] Date of visit: 03/24/23

## 2023-04-17 ENCOUNTER — Other Ambulatory Visit: Payer: Self-pay

## 2023-04-17 ENCOUNTER — Ambulatory Visit: Payer: Medicare Other | Admitting: Physical Medicine and Rehabilitation

## 2023-04-17 VITALS — BP 118/68 | HR 65

## 2023-04-17 DIAGNOSIS — M5416 Radiculopathy, lumbar region: Secondary | ICD-10-CM | POA: Diagnosis not present

## 2023-04-17 MED ORDER — METHYLPREDNISOLONE ACETATE 80 MG/ML IJ SUSP
80.0000 mg | Freq: Once | INTRAMUSCULAR | Status: AC
Start: 2023-04-17 — End: 2023-04-17
  Administered 2023-04-17: 80 mg

## 2023-04-17 NOTE — Patient Instructions (Signed)

## 2023-04-17 NOTE — Progress Notes (Signed)
Functional Pain Scale - descriptive words and definitions  Moderate (4)   Constantly aware of pain, can complete ADLs with modification/sleep marginally affected at times/passive distraction is of no use, but active distraction gives some relief. Moderate range order  Average Pain 4   +Driver, -BT, -Dye Allergies.  Lower back pain on right side. Radiation down the back of both legs to the knee

## 2023-04-23 ENCOUNTER — Ambulatory Visit
Admission: RE | Admit: 2023-04-23 | Discharge: 2023-04-23 | Disposition: A | Discharge status: Home / Self Care | Payer: Medicare Other | Source: Ambulatory Visit | Attending: Internal Medicine | Admitting: Internal Medicine

## 2023-04-23 DIAGNOSIS — Z1231 Encounter for screening mammogram for malignant neoplasm of breast: Secondary | ICD-10-CM

## 2023-04-23 DIAGNOSIS — M8580 Other specified disorders of bone density and structure, unspecified site: Secondary | ICD-10-CM

## 2023-04-23 DIAGNOSIS — M79671 Pain in right foot: Secondary | ICD-10-CM | POA: Insufficient documentation

## 2023-04-23 NOTE — Progress Notes (Signed)
[name] - 82 y.o. female MRN [id number]  Date of birth: 1941/05/31  Office Visit Note: Visit Date: 04/17/2023 PCP: Melida Quitter, MD Referred by: London Sheer, MD  Subjective: Chief Complaint  Patient presents with   Lower Back - Pain   HPI:  [name] is a 82 y.o. female who comes in today at the request of Dr. Willia Craze for planned Right L4-5 Lumbar Interlaminar epidural steroid injection with fluoroscopic guidance.  The patient has failed conservative care including home exercise, medications, time and activity modification.  This injection will be diagnostic and hopefully therapeutic.  Please see requesting physician notes for further details and justification.   ROS Otherwise per HPI.  Assessment & Plan: Visit Diagnoses:    ICD-10-CM   1. Lumbar radiculopathy  M54.16 XR C-ARM NO REPORT    Epidural Steroid injection    methylPREDNISolone acetate (DEPO-MEDROL) injection 80 mg      Plan: No additional findings.   Meds & Orders:  Meds ordered this encounter  Medications   methylPREDNISolone acetate (DEPO-MEDROL) injection 80 mg    Orders Placed This Encounter  Procedures   XR C-ARM NO REPORT   Epidural Steroid injection    Follow-up: Return for visit to requesting provider as needed.   Procedures: No procedures performed  Lumbar Epidural Steroid Injection - Interlaminar Approach with Fluoroscopic Guidance  Patient: [name]      Date of Birth: 12-16-1940 MRN: [id number] PCP: Melida Quitter, MD      Visit Date: 04/17/2023   Universal Protocol:     Consent Given By: the patient  Position: PRONE  Additional Comments: Vital signs were monitored before and after the procedure. Patient was prepped and draped in the usual sterile fashion. The correct patient, procedure, and site was verified.   Injection Procedure Details:   Procedure diagnoses: Lumbar radiculopathy [M54.16]   Meds Administered:  Meds ordered this encounter   Medications   methylPREDNISolone acetate (DEPO-MEDROL) injection 80 mg     Laterality: Right  Location/Site:  L4-5  Needle: 3.5 in., 20 ga. Tuohy  Needle Placement: Paramedian epidural  Findings:   -Comments: Excellent flow of contrast into the epidural space.  Procedure Details: Using a paramedian approach from the side mentioned above, the region overlying the inferior lamina was localized under fluoroscopic visualization and the soft tissues overlying this structure were infiltrated with 4 ml. of 1% Lidocaine without Epinephrine. The Tuohy needle was inserted into the epidural space using a paramedian approach.   The epidural space was localized using loss of resistance along with counter oblique bi-planar fluoroscopic views.  After negative aspirate for air, blood, and CSF, a 2 ml. volume of Isovue-250 was injected into the epidural space and the flow of contrast was observed. Radiographs were obtained for documentation purposes.    The injectate was administered into the level noted above.   Additional Comments:  No complications occurred Dressing: 2 x 2 sterile gauze and Band-Aid    Post-procedure details: Patient was observed during the procedure. Post-procedure instructions were reviewed.  Patient left the clinic in stable condition.   Clinical History: EXAM: MRI LUMBAR SPINE WITHOUT CONTRAST   TECHNIQUE: Multiplanar, multisequence MR imaging of the lumbar spine was performed. No intravenous contrast was administered.   COMPARISON:  None.   FINDINGS: Segmentation:  Standard.   Alignment:  Grade 1 anterolisthesis at L4-5   Vertebrae:  No fracture, evidence of discitis, or bone lesion.   Conus medullaris and  cauda equina: Conus extends to the L1 level. Conus and cauda equina appear normal.   Paraspinal and other soft tissues: Negative.   Disc levels:   T12-L1: Disc desiccation with minimal bulge.  No stenosis.   L1-L2: Normal disc space and facet  joints. No spinal canal stenosis. No neural foraminal stenosis.   L2-L3: Small disc bulge. No spinal canal stenosis. No neural foraminal stenosis.   L3-L4: Intermediate sized left asymmetric disc bulge with mild facet hypertrophy. Left lateral recess narrowing without central spinal canal stenosis. No neural foraminal stenosis.   L4-L5: Severe facet hypertrophy with left asymmetric disc bulge and superimposed right subarticular disc protrusion. Right lateral recess narrowing without central spinal canal stenosis. No neural foraminal stenosis.   L5-S1: Right asymmetric disc bulge. No spinal canal stenosis. Mild right neural foraminal stenosis.   Visualized sacrum: Normal.   IMPRESSION: 1. L4-L5 severe facet arthrosis with grade 1 anterolisthesis. Right lateral recess stenosis could contribute to right L5 radiculopathy. 2. L3-L4 left lateral recess stenosis could contribute to left L4 radiculopathy. 3. L5-S1 mild right neural foraminal stenosis.     Electronically Signed   By: Deatra Robinson M.D.   On: 06/22/2021 19:34     Objective:  VS:  HT:    WT:   BMI:     BP:118/68  HR:65bpm  TEMP: ( )  RESP:  Physical Exam Vitals and nursing note reviewed.  Constitutional:      General: She is not in acute distress.    Appearance: Normal appearance. She is not ill-appearing.  HENT:     Head: Normocephalic and atraumatic.     Right Ear: External ear normal.     Left Ear: External ear normal.  Eyes:     Extraocular Movements: Extraocular movements intact.  Cardiovascular:     Rate and Rhythm: Normal rate.     Pulses: Normal pulses.  Pulmonary:     Effort: Pulmonary effort is normal. No respiratory distress.  Abdominal:     General: There is no distension.     Palpations: Abdomen is soft.  Musculoskeletal:        General: Tenderness present.     Cervical back: Neck supple.     Right lower leg: No edema.     Left lower leg: No edema.     Comments: Patient has good  distal strength with no pain over the greater trochanters.  No clonus or focal weakness.  Skin:    Findings: No erythema, lesion or rash.  Neurological:     General: No focal deficit present.     Mental Status: She is alert and oriented to person, place, and time.     Sensory: No sensory deficit.     Motor: No weakness or abnormal muscle tone.     Coordination: Coordination normal.  Psychiatric:        Mood and Affect: Mood normal.        Behavior: Behavior normal.      Imaging: DG BONE DENSITY (DXA)  Result Date: 04/23/2023 EXAM: DUAL X-RAY ABSORPTIOMETRY (DXA) FOR BONE MINERAL DENSITY IMPRESSION: Referring Physician:  Melida Quitter Your patient completed a bone mineral density test using GE Lunar iDXA system (analysis version: 16). Technologist:    lmn PATIENT: Name: Corda, Leiber Patient ID: [id number] Birth Date: 1941/07/15 Height: 62.0 in. Sex: Female Measured: 04/23/2023 Weight: 181.0 lbs. Indications: Advanced Age, Caucasian, Chronic Kidney Disease, Estrogen Deficient, Hypothyroid, Hysterectomy, osteoarthritis, Postmenopausal, Rheumatoid Arthritis (714.0), Synthroid, Vitamin D Deficient Fractures: NONE  Treatments: Multivitamin, Reclast, Vitamin D (E933.5) ASSESSMENT: The BMD measured at Femur Neck Right is 0.748 g/cm2 with a T-score of -2.1. This patient is considered osteopenic/low bone mass according to World Health Organization [facility]) criteria. The quality of the exam is good. L3 and L4 were excluded due to degenerative changes. Site Region Measured Date Measured Age YA BMD Significant CHANGE T-score DualFemur Neck Right 04/23/2023 83.9 -2.1 0.748 g/cm2 AP Spine L1-L2 04/23/2023 83.9 0.1 1.198 g/cm2 DualFemur Total Mean 04/23/2023 83.9 -1.0 0.887 g/cm2 Left Forearm Radius 33% 04/23/2023 83.9 0.5 0.937 g/cm2 World Health Organization [facility]) criteria for post-menopausal, Caucasian Women: Normal       T-score at or above -1 SD Osteopenia   T-score between -1 and -2.5 SD Osteoporosis T-score  at or below -2.5 SD RECOMMENDATION: 1. All patients should optimize calcium and vitamin D intake. 2. Consider FDA-approved medical therapies in postmenopausal women and men aged 87 years and older, based on the following: a. A hip or vertebral (clinical or morphometric) fracture. b. T-score = -2.5 at the femoral neck or spine after appropriate evaluation to exclude secondary causes. c. Low bone mass (T-score between -1.0 and -2.5 at the femoral neck or spine) and a 10-year probability of a hip fracture = 3% or a 10-year probability of a major osteoporosis-related fracture = 20% based on the [place]-adapted WHO algorithm. d. Clinician judgment and/or patient preferences may indicate treatment for people with 10-year fracture probabilities above or below these levels. FOLLOW-UP: Patients with diagnosis of osteoporosis or at high risk for fracture should have regular bone mineral density tests.? Patients eligible for Medicare are allowed routine testing every 2 years.? The testing frequency can be increased to one year for patients who have rapidly progressing disease, are receiving or discontinuing medical therapy to restore bone mass, or have additional risk factors. I have reviewed this study and agree with the findings. [facility] Electronically Signed   By: Romona Curls M.D.   On: 04/23/2023 15:04

## 2023-04-23 NOTE — Procedures (Signed)
Lumbar Epidural Steroid Injection - Interlaminar Approach with Fluoroscopic Guidance  Patient: [name]      Date of Birth: 12/06/1940 MRN: [id number] PCP: Melida Quitter, MD      Visit Date: 04/17/2023   Universal Protocol:     Consent Given By: the patient  Position: PRONE  Additional Comments: Vital signs were monitored before and after the procedure. Patient was prepped and draped in the usual sterile fashion. The correct patient, procedure, and site was verified.   Injection Procedure Details:   Procedure diagnoses: Lumbar radiculopathy [M54.16]   Meds Administered:  Meds ordered this encounter  Medications   methylPREDNISolone acetate (DEPO-MEDROL) injection 80 mg     Laterality: Right  Location/Site:  L4-5  Needle: 3.5 in., 20 ga. Tuohy  Needle Placement: Paramedian epidural  Findings:   -Comments: Excellent flow of contrast into the epidural space.  Procedure Details: Using a paramedian approach from the side mentioned above, the region overlying the inferior lamina was localized under fluoroscopic visualization and the soft tissues overlying this structure were infiltrated with 4 ml. of 1% Lidocaine without Epinephrine. The Tuohy needle was inserted into the epidural space using a paramedian approach.   The epidural space was localized using loss of resistance along with counter oblique bi-planar fluoroscopic views.  After negative aspirate for air, blood, and CSF, a 2 ml. volume of Isovue-250 was injected into the epidural space and the flow of contrast was observed. Radiographs were obtained for documentation purposes.    The injectate was administered into the level noted above.   Additional Comments:  No complications occurred Dressing: 2 x 2 sterile gauze and Band-Aid    Post-procedure details: Patient was observed during the procedure. Post-procedure instructions were reviewed.  Patient left the clinic in stable condition.

## 2023-05-20 ENCOUNTER — Other Ambulatory Visit: Payer: Self-pay | Admitting: Physical Medicine and Rehabilitation

## 2023-05-20 ENCOUNTER — Telehealth: Payer: Self-pay | Admitting: Physical Medicine and Rehabilitation

## 2023-05-20 DIAGNOSIS — M5416 Radiculopathy, lumbar region: Secondary | ICD-10-CM

## 2023-05-20 NOTE — Telephone Encounter (Signed)
See Megan's note:

## 2023-05-20 NOTE — Progress Notes (Signed)
Spoke with patient via telephone this afternoon, she reports significant relief of pain, greater than 50% relief of pain for 2.5 weeks with recent right L4-L5 interlaminar epidural steroid injection in our office on 04/17/2023. States her pain gradually returned over the last several weeks. Her pain is now lower back radiating to lateral hips, more L5 pattern. I placed order for bilateral L5 transforaminal epidural steroid injection.

## 2023-05-20 NOTE — Telephone Encounter (Signed)
Patient called and said after her injection Dr. Alvester Morin needed to know the results.The first week it did feel better and after 10 days she back in pain.FA#[phone]

## 2023-06-04 ENCOUNTER — Other Ambulatory Visit: Payer: Self-pay

## 2023-06-04 ENCOUNTER — Ambulatory Visit: Payer: Medicare Other | Admitting: Physical Medicine and Rehabilitation

## 2023-06-04 VITALS — BP 109/53 | HR 71

## 2023-06-04 DIAGNOSIS — M5416 Radiculopathy, lumbar region: Secondary | ICD-10-CM

## 2023-06-04 MED ORDER — METHYLPREDNISOLONE ACETATE 80 MG/ML IJ SUSP
80.0000 mg | Freq: Once | INTRAMUSCULAR | Status: AC
Start: 2023-06-04 — End: 2023-06-04
  Administered 2023-06-04: 80 mg

## 2023-06-04 NOTE — Progress Notes (Signed)
Functional Pain Scale - descriptive words and definitions  Uncomfortable (3)  Pain is present but can complete all ADL's/sleep is slightly affected and passive distraction only gives marginal relief. Mild range order  Average Pain  pain only when standing   +Driver, -BT, -Dye Allergies.  Lower back pain on both sides that radiates into the top of the buttocks. Standing makes pain worse and sitting makes it better

## 2023-06-04 NOTE — Patient Instructions (Signed)

## 2023-06-09 NOTE — Procedures (Signed)
Lumbosacral Transforaminal Epidural Steroid Injection - Sub-Pedicular Approach with Fluoroscopic Guidance  Patient: [name]      Date of Birth: 1940/11/09 MRN: [id number] PCP: Melida Quitter, MD      Visit Date: 06/04/2023   Universal Protocol:    Date/Time: 06/04/2023  Consent Given By: the patient  Position: PRONE  Additional Comments: Vital signs were monitored before and after the procedure. Patient was prepped and draped in the usual sterile fashion. The correct patient, procedure, and site was verified.   Injection Procedure Details:   Procedure diagnoses: Lumbar radiculopathy [M54.16]    Meds Administered:  Meds ordered this encounter  Medications   methylPREDNISolone acetate (DEPO-MEDROL) injection 80 mg    Laterality: Bilateral  Location/Site: L5  Needle:5.0 in., 22 ga.  Short bevel or Quincke spinal needle  Needle Placement: Transforaminal  Findings:    -Comments: Excellent flow of contrast along the nerve, nerve root and into the epidural space.  Procedure Details: After squaring off the end-plates to get a true AP view, the C-arm was positioned so that an oblique view of the foramen as noted above was visualized. The target area is just inferior to the "nose of the scotty dog" or sub pedicular. The soft tissues overlying this structure were infiltrated with 2-3 ml. of 1% Lidocaine without Epinephrine.  The spinal needle was inserted toward the target using a "trajectory" view along the fluoroscope beam.  Under AP and lateral visualization, the needle was advanced so it did not puncture dura and was located close the 6 O'Clock position of the pedical in AP tracterory. Biplanar projections were used to confirm position. Aspiration was confirmed to be negative for CSF and/or blood. A 1-2 ml. volume of Isovue-250 was injected and flow of contrast was noted at each level. Radiographs were obtained for documentation purposes.   After attaining the desired  flow of contrast documented above, a 0.5 to 1.0 ml test dose of 0.25% Marcaine was injected into each respective transforaminal space.  The patient was observed for 90 seconds post injection.  After no sensory deficits were reported, and normal lower extremity motor function was noted,   the above injectate was administered so that equal amounts of the injectate were placed at each foramen (level) into the transforaminal epidural space.   Additional Comments:  No complications occurred Dressing: 2 x 2 sterile gauze and Band-Aid    Post-procedure details: Patient was observed during the procedure. Post-procedure instructions were reviewed.  Patient left the clinic in stable condition.

## 2023-06-09 NOTE — Progress Notes (Signed)
[name] - 82 y.o. female MRN [id number]  Date of birth: 03-21-1941  Office Visit Note: Visit Date: 06/04/2023 PCP: Melida Quitter, MD Referred by: Melida Quitter, MD  Subjective: Chief Complaint  Patient presents with   Lower Back - Pain   HPI:  [name] is a 82 y.o. female who comes in today at the request of Ellin Goodie, FNP and Dr. Willia Craze for planned Bilateral L5 Lumbar Transforaminal epidural steroid injection with fluoroscopic guidance.  The patient has failed conservative care including home exercise, medications, time and activity modification.  This injection will be diagnostic and hopefully therapeutic.  Please see requesting physician notes for further details and justification.   ROS Otherwise per HPI.  Assessment & Plan: Visit Diagnoses:    ICD-10-CM   1. Lumbar radiculopathy  M54.16 XR C-ARM NO REPORT    methylPREDNISolone acetate (DEPO-MEDROL) injection 80 mg    Epidural Steroid injection      Plan: No additional findings.   Meds & Orders:  Meds ordered this encounter  Medications   methylPREDNISolone acetate (DEPO-MEDROL) injection 80 mg    Orders Placed This Encounter  Procedures   XR C-ARM NO REPORT   Epidural Steroid injection    Follow-up: Return for visit to requesting provider as needed.   Procedures: No procedures performed  Lumbosacral Transforaminal Epidural Steroid Injection - Sub-Pedicular Approach with Fluoroscopic Guidance  Patient: [name]      Date of Birth: 1941/08/31 MRN: [id number] PCP: Melida Quitter, MD      Visit Date: 06/04/2023   Universal Protocol:    Date/Time: 06/04/2023  Consent Given By: the patient  Position: PRONE  Additional Comments: Vital signs were monitored before and after the procedure. Patient was prepped and draped in the usual sterile fashion. The correct patient, procedure, and site was verified.   Injection Procedure Details:   Procedure diagnoses: Lumbar radiculopathy  [M54.16]    Meds Administered:  Meds ordered this encounter  Medications   methylPREDNISolone acetate (DEPO-MEDROL) injection 80 mg    Laterality: Bilateral  Location/Site: L5  Needle:5.0 in., 22 ga.  Short bevel or Quincke spinal needle  Needle Placement: Transforaminal  Findings:    -Comments: Excellent flow of contrast along the nerve, nerve root and into the epidural space.  Procedure Details: After squaring off the end-plates to get a true AP view, the C-arm was positioned so that an oblique view of the foramen as noted above was visualized. The target area is just inferior to the "nose of the scotty dog" or sub pedicular. The soft tissues overlying this structure were infiltrated with 2-3 ml. of 1% Lidocaine without Epinephrine.  The spinal needle was inserted toward the target using a "trajectory" view along the fluoroscope beam.  Under AP and lateral visualization, the needle was advanced so it did not puncture dura and was located close the 6 O'Clock position of the pedical in AP tracterory. Biplanar projections were used to confirm position. Aspiration was confirmed to be negative for CSF and/or blood. A 1-2 ml. volume of Isovue-250 was injected and flow of contrast was noted at each level. Radiographs were obtained for documentation purposes.   After attaining the desired flow of contrast documented above, a 0.5 to 1.0 ml test dose of 0.25% Marcaine was injected into each respective transforaminal space.  The patient was observed for 90 seconds post injection.  After no sensory deficits were reported, and normal lower extremity motor function was noted,  the above injectate was administered so that equal amounts of the injectate were placed at each foramen (level) into the transforaminal epidural space.   Additional Comments:  No complications occurred Dressing: 2 x 2 sterile gauze and Band-Aid    Post-procedure details: Patient was observed during the  procedure. Post-procedure instructions were reviewed.  Patient left the clinic in stable condition.    Clinical History: EXAM: MRI LUMBAR SPINE WITHOUT CONTRAST   TECHNIQUE: Multiplanar, multisequence MR imaging of the lumbar spine was performed. No intravenous contrast was administered.   COMPARISON:  None.   FINDINGS: Segmentation:  Standard.   Alignment:  Grade 1 anterolisthesis at L4-5   Vertebrae:  No fracture, evidence of discitis, or bone lesion.   Conus medullaris and cauda equina: Conus extends to the L1 level. Conus and cauda equina appear normal.   Paraspinal and other soft tissues: Negative.   Disc levels:   T12-L1: Disc desiccation with minimal bulge.  No stenosis.   L1-L2: Normal disc space and facet joints. No spinal canal stenosis. No neural foraminal stenosis.   L2-L3: Small disc bulge. No spinal canal stenosis. No neural foraminal stenosis.   L3-L4: Intermediate sized left asymmetric disc bulge with mild facet hypertrophy. Left lateral recess narrowing without central spinal canal stenosis. No neural foraminal stenosis.   L4-L5: Severe facet hypertrophy with left asymmetric disc bulge and superimposed right subarticular disc protrusion. Right lateral recess narrowing without central spinal canal stenosis. No neural foraminal stenosis.   L5-S1: Right asymmetric disc bulge. No spinal canal stenosis. Mild right neural foraminal stenosis.   Visualized sacrum: Normal.   IMPRESSION: 1. L4-L5 severe facet arthrosis with grade 1 anterolisthesis. Right lateral recess stenosis could contribute to right L5 radiculopathy. 2. L3-L4 left lateral recess stenosis could contribute to left L4 radiculopathy. 3. L5-S1 mild right neural foraminal stenosis.     Electronically Signed   By: Deatra Robinson M.D.   On: 06/22/2021 19:34     Objective:  VS:  HT:    WT:   BMI:     BP:(!) 109/53  HR:71bpm  TEMP: ( )  RESP:  Physical Exam Vitals and nursing  note reviewed.  Constitutional:      General: She is not in acute distress.    Appearance: Normal appearance. She is not ill-appearing.  HENT:     Head: Normocephalic and atraumatic.     Right Ear: External ear normal.     Left Ear: External ear normal.  Eyes:     Extraocular Movements: Extraocular movements intact.  Cardiovascular:     Rate and Rhythm: Normal rate.     Pulses: Normal pulses.  Pulmonary:     Effort: Pulmonary effort is normal. No respiratory distress.  Abdominal:     General: There is no distension.     Palpations: Abdomen is soft.  Musculoskeletal:        General: Tenderness present.     Cervical back: Neck supple.     Right lower leg: No edema.     Left lower leg: No edema.     Comments: Patient has good distal strength with no pain over the greater trochanters.  No clonus or focal weakness.  Skin:    Findings: No erythema, lesion or rash.  Neurological:     General: No focal deficit present.     Mental Status: She is alert and oriented to person, place, and time.     Sensory: No sensory deficit.     Motor: No weakness  or abnormal muscle tone.     Coordination: Coordination normal.  Psychiatric:        Mood and Affect: Mood normal.        Behavior: Behavior normal.      Imaging: No results found.

## 2023-06-16 ENCOUNTER — Telehealth: Payer: Self-pay | Admitting: Physical Medicine and Rehabilitation

## 2023-06-16 NOTE — Telephone Encounter (Signed)
Patient called. She is better and does not need appointment.

## 2023-06-16 NOTE — Telephone Encounter (Signed)
Spoke with patient and she stated she is feeling much better. Appointment cancelled

## 2023-06-19 ENCOUNTER — Ambulatory Visit: Payer: Medicare Other | Admitting: Physical Medicine and Rehabilitation

## 2023-06-24 ENCOUNTER — Ambulatory Visit: Payer: Medicare Other | Admitting: Interventional Cardiology

## 2023-06-25 ENCOUNTER — Other Ambulatory Visit: Payer: Self-pay | Admitting: Interventional Cardiology

## 2023-06-26 ENCOUNTER — Telehealth: Payer: Self-pay | Admitting: Internal Medicine

## 2023-06-26 ENCOUNTER — Other Ambulatory Visit: Payer: Self-pay | Admitting: Interventional Cardiology

## 2023-06-26 NOTE — Telephone Encounter (Signed)
*  STAT* If patient is at the pharmacy, call can be transferred to refill team.   1. Which medications need to be refilled? (please list name of each medication and dose if known)   hydrochlorothiazide (HYDRODIURIL) 25 MG tablet   2. Would you like to learn more about the convenience, safety, & potential cost savings by using the [facility]?   3. Are you open to using the [facility] (Type [facility]. ).  4. Which pharmacy/location (including street and city if local pharmacy) is medication to be sent to?  [facility] - [place], [place] - [address]   5. Do they need a 30 day or 90 day supply?   90 day   Patient stated she has 4 tablets left.  Patient has appointment scheduled on 1/16.

## 2023-06-26 NOTE — Telephone Encounter (Signed)
Rx(s) sent to pharmacy electronically.  Left message informing patient that we have sent her rx to the pharmacy and requested a call back with any questions.

## 2023-07-09 ENCOUNTER — Other Ambulatory Visit: Payer: Self-pay | Admitting: Interventional Cardiology

## 2023-07-24 ENCOUNTER — Other Ambulatory Visit: Payer: Self-pay | Admitting: Internal Medicine

## 2023-07-24 ENCOUNTER — Other Ambulatory Visit: Payer: Self-pay | Admitting: Interventional Cardiology

## 2023-08-04 ENCOUNTER — Other Ambulatory Visit: Payer: Self-pay | Admitting: Interventional Cardiology

## 2023-08-07 ENCOUNTER — Other Ambulatory Visit (HOSPITAL_BASED_OUTPATIENT_CLINIC_OR_DEPARTMENT_OTHER): Payer: Self-pay | Admitting: Obstetrics & Gynecology

## 2023-08-07 DIAGNOSIS — N3281 Overactive bladder: Secondary | ICD-10-CM

## 2023-08-08 NOTE — Telephone Encounter (Signed)
Left vm for patient regarding rx refill

## 2023-08-11 ENCOUNTER — Encounter (HOSPITAL_BASED_OUTPATIENT_CLINIC_OR_DEPARTMENT_OTHER): Payer: Self-pay

## 2023-08-11 ENCOUNTER — Telehealth (HOSPITAL_BASED_OUTPATIENT_CLINIC_OR_DEPARTMENT_OTHER): Payer: Self-pay | Admitting: Obstetrics & Gynecology

## 2023-08-11 ENCOUNTER — Ambulatory Visit (HOSPITAL_BASED_OUTPATIENT_CLINIC_OR_DEPARTMENT_OTHER): Payer: Medicare Other | Admitting: Obstetrics & Gynecology

## 2023-08-11 NOTE — Telephone Encounter (Signed)
Called patient and left a message to call the office back to schedule the appointment .  

## 2023-08-15 ENCOUNTER — Other Ambulatory Visit: Payer: Self-pay

## 2023-08-15 DIAGNOSIS — I6523 Occlusion and stenosis of bilateral carotid arteries: Secondary | ICD-10-CM

## 2023-08-29 ENCOUNTER — Ambulatory Visit (HOSPITAL_COMMUNITY)
Admission: RE | Admit: 2023-08-29 | Discharge: 2023-08-29 | Disposition: A | Discharge status: Home / Self Care | Payer: Medicare Other | Source: Ambulatory Visit | Attending: Vascular Surgery | Admitting: Vascular Surgery

## 2023-08-29 DIAGNOSIS — I6523 Occlusion and stenosis of bilateral carotid arteries: Secondary | ICD-10-CM

## 2023-09-01 ENCOUNTER — Encounter (HOSPITAL_COMMUNITY): Payer: Medicare Other

## 2023-09-01 ENCOUNTER — Ambulatory Visit: Payer: Medicare Other | Admitting: Surgery

## 2023-09-01 ENCOUNTER — Encounter: Payer: Self-pay | Admitting: Surgery

## 2023-09-01 VITALS — BP 136/68 | HR 61 | Temp 97.9°F | Resp 16 | Ht 63.0 in | Wt 185.0 lb

## 2023-09-01 DIAGNOSIS — I6523 Occlusion and stenosis of bilateral carotid arteries: Secondary | ICD-10-CM

## 2023-09-01 NOTE — Progress Notes (Signed)
[facility] [place]  Patient name: JAILEY Pham MRN: [id number] DOB: 08-Feb-1941 Sex: female   REASON FOR VISIT:    Follow up  HISOTRY OF PRESENT ILLNESS:    [name] is a 82 y.o. female who has a history of a right carotid endarterectomy by Dr. Madilyn Fireman in 1998.  She had a CT scan in February 2023 that showed a 60% left-sided stenosis and 40% right-sided stenosis.  Ultrasound in December 2023 showed a 60 to 79% right-sided stenosis and 40 to 59% left-sided stenosis.  She is back today for follow-up.  She remains asymptomatic.  Specifically, she denies numbness or weakness in either extremity.  She denies slurred speech.  She denies amaurosis fugax.  The patient is a former smoker. She is medically managed for hypertension. She takes a statin for hypercholesterolemia.  PAST MEDICAL HISTORY:   Past Medical History:  Diagnosis Date   Arthritis    Back pain    had cortisone injection 6/15, Dr Cleophas Dunker   Carotid artery occlusion    Carotid bruit    Colitis, collagenous    Diverticulosis    GERD (gastroesophageal reflux disease)    Hyperlipidemia    Hypertension    Injury of left leg 06/05/12   Pt. fell   Peripheral vascular disease (HCC)    Shingles outbreak 2017   Thyroid disease 02-27-12   lt. thyroid nodule, bx. done 5 yrs ago-now some enlargement is seen.     FAMILY HISTORY:   Family History  Problem Relation Age of Onset   Heart disease Mother    Hypertension Mother    Arthritis Mother    Stroke Mother    Hyperlipidemia Mother    Atrial fibrillation Mother    Heart disease Brother        before age 38   Heart attack Neg Hx    Breast cancer Neg Hx     SOCIAL HISTORY:   Social History   Tobacco Use   Smoking status: Former    Current packs/day: 0.00    Types: Cigarettes    Quit date: 09/24/1975    Years since quitting: 47.9   Smokeless tobacco: Never  Substance Use Topics   Alcohol use: Yes     Alcohol/week: 1.0 standard drink of alcohol    Types: 1 Glasses of wine per week     ALLERGIES:   Allergies  Allergen Reactions   Zebeta Other (See Comments)    Does not work, per patient.   Ace Inhibitors Other (See Comments)    cough   Hydroxychloroquine Sulfate Rash     CURRENT MEDICATIONS:   Current Outpatient Medications  Medication Sig Dispense Refill   acetaminophen (TYLENOL) 500 MG tablet Take 1,000 mg by mouth 2 (two) times daily.     amLODipine (NORVASC) 2.5 MG tablet Take 1 tablet (2.5 mg total) by mouth daily with breakfast. Please keep scheduled appointment for future refills. Thank you. 90 tablet 0   aspirin 81 MG EC tablet Take 81 mg by mouth daily.     B Complex Vitamins (VITAMIN-B COMPLEX PO) Take 1 tablet by mouth daily with breakfast.     Cholecalciferol (VITAMIN D) 50 MCG (2000 UT) tablet Take 4,000 Units by mouth daily.     estradiol (ESTRACE) 0.1 MG/GM vaginal cream Apply to skin externally nightly for the next 7 days and then decrease to using twice weekly. 42.5 g 1   famotidine (PEPCID) 20 MG tablet Take 1 tablet by  mouth as needed.     furosemide (LASIX) 40 MG tablet Take 1 tablet (40 mg total) by mouth daily as needed for edema. 30 tablet 6   GEMTESA 75 MG TABS TAKE 1 TABLET BY MOUTH DAILY 90 tablet 4   hydrochlorothiazide (HYDRODIURIL) 25 MG tablet Take 1 tablet by mouth daily. 90 tablet 1   ipratropium (ATROVENT) 0.06 % nasal spray SMARTSIG:2 Spray(s) Both Nares Twice Daily PRN     KRILL OIL PO Take 500 mg by mouth daily. MEGA RED     leflunomide (ARAVA) 20 MG tablet Take 20 mg by mouth daily.     levothyroxine (SYNTHROID) 112 MCG tablet Take 1 tablet (112 mcg total) by mouth daily. 90 tablet 3   methotrexate (RHEUMATREX) 2.5 MG tablet Take 15 mg by mouth once a week.     metoprolol succinate (TOPROL-XL) 100 MG 24 hr tablet TAKE 1 TABLET BY MOUTH DAILY 90 tablet 2   mometasone (ELOCON) 0.1 % cream Apply 1 application topically daily. 45 g 1    Multiple Vitamin (MULTIVITAMIN) tablet Take 1 tablet by mouth daily.       rosuvastatin (CRESTOR) 20 MG tablet TAKE 1 TABLET BY MOUTH DAILY 90 tablet 2   telmisartan (MICARDIS) 80 MG tablet TAKE 1 TABLET BY MOUTH DAILY 90 tablet 0   tiZANidine (ZANAFLEX) 2 MG tablet Take 2 mg by mouth 3 (three) times daily.     triamcinolone cream (KENALOG) 0.1 % Apply topically 2 (two) times daily.     verapamil (CALAN-SR) 240 MG CR tablet TAKE 1 TABLET BY MOUTH DAILY WITH BREAKFAST 90 tablet 0   No current facility-administered medications for this visit.    REVIEW OF SYSTEMS:   [X]  denotes positive finding, [ ]  denotes negative finding Cardiac  Comments:  Chest pain or chest pressure:    Shortness of breath upon exertion:    Short of breath when lying flat:    Irregular heart rhythm:        Vascular    Pain in calf, thigh, or hip brought on by ambulation:    Pain in feet at night that wakes you up from your sleep:     Blood clot in your veins:    Leg swelling:         Pulmonary    Oxygen at home:    Productive cough:     Wheezing:         Neurologic    Sudden weakness in arms or legs:     Sudden numbness in arms or legs:     Sudden onset of difficulty speaking or slurred speech:    Temporary loss of vision in one eye:     Problems with dizziness:         Gastrointestinal    Blood in stool:     Vomited blood:         Genitourinary    Burning when urinating:     Blood in urine:        Psychiatric    Major depression:         Hematologic    Bleeding problems:    Problems with blood clotting too easily:        Skin    Rashes or ulcers:        Constitutional    Fever or chills:      PHYSICAL EXAM:   Vitals:   09/01/23 1420 09/01/23 1423  BP: (!) 111/55 136/68  Pulse: 61  Resp: 16   Temp: 97.9 F (36.6 C)   SpO2: 95%   Weight: 185 lb (83.9 kg)   Height: 5\' 3"  (1.6 m)     GENERAL: The patient is a well-nourished female, in no acute distress. The vital signs are  documented above. CARDIAC: There is a regular rate and rhythm.  VASCULAR: Bilateral carotid bruit PULMONARY: Non-labored respirations ABDOMEN: Soft and non-tender.  No pulsatile mass  MUSCULOSKELETAL: There are no major deformities or cyanosis. NEUROLOGIC: No focal weakness or paresthesias are detected. SKIN: There are no ulcers or rashes noted. PSYCHIATRIC: The patient has a normal affect.  STUDIES:   I have reviewed her ultrasound with the following findings: Right Carotid: Velocities in the right ICA are consistent with a 60-79%                 stenosis.   Left Carotid: Velocities in the left ICA are consistent with a 40-59%  stenosis.   Vertebrals: Bilateral vertebral arteries demonstrate antegrade flow.    MEDICAL ISSUES:   Carotid: She remains asymptomatic.  Ultrasound remained stable at 60 to 79% right-sided stenosis.  She will follow-up in 1 year with repeat imaging.  She will contact me sooner should she develop symptoms    Charlena Cross, MD, FACS [facility] [phone] Pager [phone]

## 2023-09-04 ENCOUNTER — Other Ambulatory Visit: Payer: Self-pay

## 2023-09-04 DIAGNOSIS — I6523 Occlusion and stenosis of bilateral carotid arteries: Secondary | ICD-10-CM

## 2023-09-05 ENCOUNTER — Ambulatory Visit: Payer: Medicare Other | Admitting: Orthopedic Surgery

## 2023-09-05 ENCOUNTER — Telehealth: Payer: Self-pay

## 2023-09-05 ENCOUNTER — Other Ambulatory Visit (INDEPENDENT_AMBULATORY_CARE_PROVIDER_SITE_OTHER): Payer: Medicare Other

## 2023-09-05 DIAGNOSIS — M25551 Pain in right hip: Secondary | ICD-10-CM | POA: Diagnosis not present

## 2023-09-05 NOTE — Telephone Encounter (Signed)
[name] wondering if we can get patient in for a hip injection next week before she goes out of town?

## 2023-09-05 NOTE — Progress Notes (Signed)
Orthopedic Surgery Progress Note   Assessment: Patient is a 82 y.o. female with right hip pain consistent with OA   Plan: -Recommended a injection with Dr. Shon Baton under ultrasound -Can use Tylenol up to 1000 mg 3 times per day -Activity as tolerated, weightbearing as tolerated -Follow-up on an as-needed basis  ___________________________________________________________________________  Subjective: Patient has had several years of hip pain.  She previously saw Dr. Cleophas Dunker for hip pain.  She had gotten an injection over a year ago and felt that was helpful for over 6 months.  She has recently noticed worsening of her hip pain.  She feels it over the lateral aspect of the hip and in the groin.  She only notes the pain when she is weightbearing.  She has no pain when she is sitting or laying down.  She is not having any left hip pain.   Physical Exam:  General: no acute distress, appears stated age Neurologic: alert, answering questions appropriately, following commands Respiratory: unlabored breathing on room air, symmetric chest rise Psychiatric: appropriate affect, normal cadence to speech  MSK:   -RLE:  Positive FADIR, pain with extremes of internal and external rotation, positive Stinchfield, negative FABER, negative SI joint compression test  EHL/TA/GSC intact Sensation intact to light touch in sural, saphenous, tibial, deep peroneal, and superficial peroneal nerve distributions Foot warm and well perfused  Imaging: XRs of the right hip from 09/05/2023 were independently reviewed and interpreted, showing joint space narrowing and subchondral sclerosis in the right hip.  No fracture or dislocation seen.   Patient name: DELIMAR SISEMORE Patient MRN: [id number] Date: 09/05/23

## 2023-09-08 NOTE — Telephone Encounter (Signed)
Called and scheduled patient for Tuesday 09/09/23 at 10:45am.

## 2023-09-09 ENCOUNTER — Ambulatory Visit: Payer: Medicare Other | Admitting: Sports Medicine

## 2023-09-09 ENCOUNTER — Other Ambulatory Visit: Payer: Self-pay

## 2023-09-09 ENCOUNTER — Encounter: Payer: Self-pay | Admitting: Sports Medicine

## 2023-09-09 DIAGNOSIS — M1611 Unilateral primary osteoarthritis, right hip: Secondary | ICD-10-CM

## 2023-09-09 DIAGNOSIS — M25551 Pain in right hip: Secondary | ICD-10-CM | POA: Diagnosis not present

## 2023-09-09 MED ORDER — METHYLPREDNISOLONE ACETATE 40 MG/ML IJ SUSP
40.0000 mg | INTRAMUSCULAR | Status: AC | PRN
Start: 2023-09-09 — End: 2023-09-09
  Administered 2023-09-09: 40 mg via INTRA_ARTICULAR

## 2023-09-09 MED ORDER — LIDOCAINE HCL 1 % IJ SOLN
4.0000 mL | INTRAMUSCULAR | Status: AC | PRN
Start: 2023-09-09 — End: 2023-09-09
  Administered 2023-09-09: 4 mL

## 2023-09-09 NOTE — Progress Notes (Signed)
   Procedure Note  Patient: [name]             Date of Birth: March 09, 1941           MRN: [id number]             Visit Date: 09/09/2023  Procedures: Visit Diagnoses:  1. Right hip pain   2. Unilateral primary osteoarthritis, right hip    Large Joint Inj: R hip joint on 09/09/2023 11:16 AM Indications: pain Details: 22 G 3.5 in needle, ultrasound-guided anterior approach Medications: 4 mL lidocaine 1 %; 40 mg methylPREDNISolone acetate 40 MG/ML Outcome: tolerated well, no immediate complications  Procedure: US-guided intra-articular hip injection, Right After discussion on risks/benefits/indications and informed verbal consent was obtained, a timeout was performed. Patient was lying supine on exam table. The hip was cleaned with betadine and alcohol swabs. Then utilizing ultrasound guidance, the patient's femoral head and neck junction was identified and subsequently injected with 4:1 lidocaine:depomedrol via an in-plane approach with ultrasound visualization of the injectate administered into the hip joint. Patient tolerated procedure well without immediate complications.  Procedure, treatment alternatives, risks and benefits explained, specific risks discussed. Consent was given by the patient. Immediately prior to procedure a time out was called to verify the correct patient, procedure, equipment, support staff and site/side marked as required. Patient was prepped and draped in the usual sterile fashion.    - I evaluated the patient about 5 minutes post-injection and she already had improvement in pain and range of motion - follow-up with Dr. Christell Constant as indicated; I am happy to see them as needed  Madelyn Brunner, DO Primary Care Sports Medicine Physician  [facility]  This note was dictated using Dragon naturally speaking software and may contain errors in syntax, spelling, or content which have not been identified prior to signing this note.

## 2023-09-29 ENCOUNTER — Other Ambulatory Visit: Payer: Self-pay | Admitting: Internal Medicine

## 2023-10-08 ENCOUNTER — Ambulatory Visit: Payer: Medicare Other | Admitting: Internal Medicine

## 2023-10-09 ENCOUNTER — Ambulatory Visit: Payer: Medicare Other | Attending: Internal Medicine | Admitting: Internal Medicine

## 2023-10-09 VITALS — BP 108/60 | HR 64 | Ht 60.0 in | Wt 182.6 lb

## 2023-10-09 DIAGNOSIS — I119 Hypertensive heart disease without heart failure: Secondary | ICD-10-CM | POA: Diagnosis not present

## 2023-10-09 DIAGNOSIS — I77810 Thoracic aortic ectasia: Secondary | ICD-10-CM | POA: Diagnosis not present

## 2023-10-09 DIAGNOSIS — I6523 Occlusion and stenosis of bilateral carotid arteries: Secondary | ICD-10-CM | POA: Diagnosis not present

## 2023-10-09 NOTE — Progress Notes (Signed)
Cardiology Office Note:  .    Date:  10/09/2023  ID:  Namon Cirri, DOB 03/10/1941, MRN [id number] PCP: Melida Quitter, MD  [facility] Providers Cardiologist:  Lance Muss, MD     CC: Establish care  History of Present Illness: .    [name] is a 83 y.o. female  with a history of hypertensive heart disease, hyperlipidemia, carotid artery stenosis, and hypothyroidism, presents for a routine check-up. She has been managed by vascular colleagues for her carotid artery stenosis and by her primary care physician for hypothyroidism. She is on a regimen of amlodipine, verapamil, telmisartan, and methotrexate. She reports no recent heart issues and has been adhering to her medication regimen.  She has a history of a mildly leaky heart valve and mild dilation of the aorta, as per an echocardiogram conducted in 2020. She reports occasional deep breathing at night but denies any shortness of breath. She is active, participating in activities such as bridge, Mahjong, and church activities. She lives in a two-level house and frequently uses the stairs. She expresses a desire to lose weight but reports being comfortable with her current weight.  She has a history of hypothyroidism and had the left lobe of her thyroid removed due to a growing nodule, which was not cancerous. She has not had any neuroendocrine issues or medullary thyroid carcinoma. She reports no chest pain or breathing issues. She has a history of being followed by Dr. Patty Sermons and is currently under the care of Dr. Eldridge Dace. She also has a primary care physician, Dr. Nadene Rubins.   Relevant histories: .  Social - former TB and JV patient ROS: As per HPI.   Studies Reviewed: .   Cardiac Studies & Procedures     STRESS TESTS  MYOCARDIAL PERFUSION IMAGING 03/31/2019  Narrative  Nuclear stress EF: 77%. The left ventricular ejection fraction is hyperdynamic (>65%).  There was no ST segment deviation noted during  stress.  Defect 1: There is a small defect of mild severity present in the basal anterior, mid anterior and apical anterior location. This appears to be most consistent with breast attenuation  This is a low risk study. There is no evidence of ischemia or previous infarction.  The study is normal.  ECHOCARDIOGRAM  ECHOCARDIOGRAM COMPLETE 04/02/2019  Narrative ECHOCARDIOGRAM REPORT    Patient Name:   [name] Date of Exam: 04/02/2019 Medical Rec #:  [id number]       Height:       53.0 in Accession #:    [id number]      Weight:       174.0 lb Date of Birth:  [birth year]        BSA:          1.61 m Patient Age:    77 years        BP:           132/68 mmHg Patient Gender: F               HR:           59 bpm. Exam Location:  [address]   Procedure: 2D Echo, 3D Echo, Cardiac Doppler, Color Doppler and Strain Analysis  Indications:    I11.8 Hypertension  History:        Patient has no prior history of Echocardiogram examinations. Risk Factors: Hypertension, Dyslipidemia, Obesity and Former Smoker.  Sonographer:    Garald Braver, RDCS Referring Phys: [phone] Tarri Abernethy [facility]  IMPRESSIONS   1. The left ventricle has normal systolic function with an ejection fraction of 60-65%. The cavity size was normal. Left ventricular diastolic Doppler parameters are consistent with impaired relaxation. 2. The right ventricle has normal systolic function. The cavity was normal. 3. The mitral valve is grossly normal. There is mild mitral annular calcification present. 4. The aortic valve is tricuspid. Mild thickening of the aortic valve. Aortic valve regurgitation is mild by color flow Doppler. No stenosis of the aortic valve. 5. Normal LV systolic function; mild diastolic dysfunction; mild AI and MR.  FINDINGS Left Ventricle: The left ventricle has normal systolic function, with an ejection fraction of 60-65%. The cavity size was normal. There is no increase in left ventricular wall  thickness. Left ventricular diastolic Doppler parameters are consistent with impaired relaxation.  Right Ventricle: The right ventricle has normal systolic function. The cavity was normal.  Left Atrium: Left atrial size was normal in size.  Right Atrium: Right atrial size was normal in size. Right atrial pressure is estimated at 10 mmHg.  Interatrial Septum: No atrial level shunt detected by color flow Doppler.  Pericardium: There is no evidence of pericardial effusion.  Mitral Valve: The mitral valve is grossly normal. There is mild mitral annular calcification present. Mitral valve regurgitation is mild by color flow Doppler.  Tricuspid Valve: The tricuspid valve is normal in structure. Tricuspid valve regurgitation is trivial by color flow Doppler.  Aortic Valve: The aortic valve is tricuspid Mild thickening of the aortic valve. Aortic valve regurgitation is mild by color flow Doppler. There is No stenosis of the aortic valve.  Pulmonic Valve: The pulmonic valve was grossly normal. Pulmonic valve regurgitation is trivial by color flow Doppler.  Venous: The inferior vena cava is normal in size with greater than 50% respiratory variability.  Additional Comments: Normal LV systolic function; mild diastolic dysfunction; mild AI and MR.   +--------------+--------++ LEFT VENTRICLE         +----------------+---------++ +--------------+--------++ Diastology                PLAX 2D                +----------------+---------++ +--------------+--------++ LV e' lateral:  6.90 cm/s LVIDd:        4.40 cm  +----------------+---------++ +--------------+--------++ LV E/e' lateral:15.5      LVIDs:        2.90 cm  +----------------+---------++ +--------------+--------++ LV e' medial:   8.25 cm/s LV PW:        0.80 cm  +----------------+---------++ +--------------+--------++ LV E/e' medial: 13.0      LV IVS:       0.80 cm   +----------------+---------++ +--------------+--------++ LVOT diam:    1.80 cm  +----------------------+-------++ +--------------+--------++ 2D Longitudinal Strain        LV SV:        55 ml    +----------------------+-------++ +--------------+--------++ 2D Strain GLS (A2C):  -18.5 % LV SV Index:  31.25    +----------------------+-------++ +--------------+--------++ 2D Strain GLS (A3C):  -17.5 % LVOT Area:    2.54 cm +----------------------+-------++ +--------------+--------++ 2D Strain GLS (A4C):  -22.9 %                        +----------------------+-------++ +--------------+--------++ 2D Strain GLS Avg:    -19.7 % +----------------------+-------++  +-------------+------++ 3D Volume EF:       +-------------+------++ 3D EF:       53 %   +-------------+------++ LV EDV:  103 ml +-------------+------++ LV ESV:      48 ml  +-------------+------++ LV SV:       55 ml  +-------------+------++  +------------+-------------++ AORTIC VALVE              +------------+-------------++ LVOT Vmax:  9790.00 cm/s  +------------+-------------++ LVOT Vmean: 6890.000 cm/s +------------+-------------++ LVOT VTI:   26.900 m      +------------+-------------++  +--------------+----------++    +---------------+-------------++ MITRAL VALVE                TRICUSPID VALVE              +--------------+----------++    +---------------+-------------++ MV Area (PHT):3.60 cm      TR Peak grad:  258064.0 mmHg +--------------+----------++    +---------------+-------------++ MV PHT:       61.19 msec    TR Vmax:       25400.00 cm/s +--------------+----------++    +---------------+-------------++ MV Decel Time:211 msec   +--------------+----------++    +--------------+-------+ +--------------+-------------++ SHUNTS                MV E velocity:107.00 cm/s    +--------------+-------+ +--------------+-------------++ Systemic VTI: 26.90 m MV A velocity:10600.00 cm/s +--------------+-------+ +--------------+-------------++ Systemic Diam:1.80 cm MV E/A ratio: 0.01          +--------------+-------+ +--------------+-------------++   Olga Millers MD Electronically signed by Olga Millers MD Signature Date/Time: 04/02/2019/5:18:59 PM    Final              Physical Exam:    VS:  BP 108/60   Pulse 64   Ht 5' (1.524 m)   Wt 182 lb 9.6 oz (82.8 kg)   LMP 09/24/1979   SpO2 97%   BMI 35.66 kg/m    Wt Readings from Last 3 Encounters:  10/09/23 182 lb 9.6 oz (82.8 kg)  09/01/23 185 lb (83.9 kg)  03/24/23 184 lb (83.5 kg)    Gen: no distress, Morbid obesity   Neck: No JVD Cardiac: No Rubs or Gallops, Soft Diastolic Murmur, Rrr +2 radial pulses Respiratory: Clear to auscultation bilaterally, normal effort, normal  respiratory rate GI: Soft, nontender, non-distended  MS: No  edema;  moves all extremities Integument: Skin feels warm Neuro:  At time of evaluation, alert and oriented to person/place/time/situation  Psych: Normal affect, patient feels well   ASSESSMENT AND PLAN: .    Aortic Regurgitation Mild aortic regurgitation with very mild aortic dilation noted on echocardiogram in 2020. No symptom progression. EKG normal and unchanged. Discussed need for repeat echocardiogram to evaluate progression. - Order echocardiogram to evaluate progression of aortic regurgitation  Hypertension  No recent cardiac issues. Emphasized importance of continued blood pressure management to prevent complications. - Continue current antihypertensive regimen - If BP is controlled at Echo, I will drop her norvasc; I am unclear why she is not two CCBs  Hyperlipidemia Hyperlipidemia. 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   Hypothyroidism Hypothyroidism managed by primary care physician. Partial thyroidectomy performed for non-cancerous nodule. No new symptoms. - Continue current thyroid management as per primary care physician - She has morbid obesity, without clear evidence of NE tumor or medually thyroid cancer; GLP1-RA would be reasonable if she desires, not sure it would be covered  General Health Maintenance Active and socially engaged. Discussed weight loss options including Wegovy, but patient not  inclined to pursue. Encouraged physical activity and weightlifting. Explained potential insurance coverage issues with [phone]. - Encourage regular exercise and physical activity - Consider [phone] for weight loss if patient decides to pursue it  One year; offered PRN   Riley Lam, MD FASE [facility] Cardiologist [facility]  [address] [place], [address] [place], [place] [address] [phone]  10:52 AM

## 2023-10-09 NOTE — Patient Instructions (Addendum)
 Medication Instructions:  Your physician recommends that you continue on your current medications as directed. Please refer to the Current Medication list given to you today.  *If you need a refill on your cardiac medications before your next appointment, please call your pharmacy*   Lab Work: NONE If you have labs (blood work) drawn today and your tests are completely normal, you will receive your results only by: MyChart Message (if you have MyChart) OR A paper copy in the mail If you have any lab test that is abnormal or we need to change your treatment, we will call you to review the results.   Testing/Procedures: Your physician has requested that you have an echocardiogram. Echocardiography is a painless test that uses sound waves to create images of your heart. It provides your doctor with information about the size and shape of your heart and how well your heart's chambers and valves are working. This procedure takes approximately one hour. There are no restrictions for this procedure. Please do NOT wear cologne, perfume, aftershave, or lotions (deodorant is allowed). Please arrive 15 minutes prior to your appointment time.  Please note: We ask at that you not bring children with you during ultrasound (echo/ vascular) testing. Due to room size and safety concerns, children are not allowed in the ultrasound rooms during exams. Our front office staff cannot provide observation of children in our lobby area while testing is being conducted. An adult accompanying a patient to their appointment will only be allowed in the ultrasound room at the discretion of the ultrasound technician under special circumstances. We apologize for any inconvenience.    Follow-Up: At [facility], you and your health needs are our priority.  As part of our continuing mission to provide you with exceptional heart care, we have created designated Provider Care Teams.  These Care Teams include your  primary Cardiologist (physician) and Advanced Practice Providers (APPs -  Physician Assistants and Nurse Practitioners) who all work together to provide you with the care you need, when you need it.    Your next appointment:   1 year(s)  Provider:   Riley Lam, MD

## 2023-10-21 ENCOUNTER — Other Ambulatory Visit: Payer: Self-pay

## 2023-10-21 MED ORDER — TELMISARTAN 80 MG PO TABS: 80.0000 mg | ORAL_TABLET | Freq: Every day | ORAL | 3 refills | Status: DC

## 2023-10-21 MED ORDER — VERAPAMIL HCL ER 240 MG PO TBCR: 240.0000 mg | EXTENDED_RELEASE_TABLET | Freq: Every day | ORAL | 3 refills | Status: DC

## 2023-10-30 ENCOUNTER — Ambulatory Visit (HOSPITAL_COMMUNITY): Payer: Medicare Other | Attending: Internal Medicine

## 2023-10-30 DIAGNOSIS — N189 Chronic kidney disease, unspecified: Secondary | ICD-10-CM | POA: Diagnosis not present

## 2023-10-30 DIAGNOSIS — I77810 Thoracic aortic ectasia: Secondary | ICD-10-CM | POA: Diagnosis present

## 2023-10-30 DIAGNOSIS — I361 Nonrheumatic tricuspid (valve) insufficiency: Secondary | ICD-10-CM

## 2023-10-30 DIAGNOSIS — I080 Rheumatic disorders of both mitral and aortic valves: Secondary | ICD-10-CM | POA: Diagnosis not present

## 2023-10-30 DIAGNOSIS — E785 Hyperlipidemia, unspecified: Secondary | ICD-10-CM | POA: Diagnosis not present

## 2023-10-30 LAB — ECHOCARDIOGRAM COMPLETE
Area-P 1/2: 3.45 cm2
MV VTI: 1.58 cm2
P 1/2 time: 330 ms
S' Lateral: 3.1 cm

## 2023-11-02 ENCOUNTER — Encounter: Payer: Self-pay | Admitting: Internal Medicine

## 2023-11-05 NOTE — Telephone Encounter (Signed)
-----   Message from Christell Constant sent at 11/02/2023  2:19 PM EST ----- Results: Increase in Mitral calc calcification likely related to age and time BP elevated Plan: Stop Norvasc given that she is on verapamil; BP checks off this medication.  If BP remains elevated, increased verapamil to max dose  Christell Constant, MD

## 2023-11-05 NOTE — Telephone Encounter (Signed)
The patient has been notified of the result and verbalized understanding.  All questions (if any) were answered. Arvid Right Janitza Revuelta, RN 11/05/2023 3:30 PM   Pt reports stopped taking amlodipine on Monday.  Will monitor BP.  I advised pt that Mitral calcification can be age related.  Advised pt of symptoms that would warrant a call to our office.  Pt thanked me for call no further needs at this time.

## 2023-11-12 ENCOUNTER — Telehealth: Payer: Self-pay | Admitting: Internal Medicine

## 2023-11-12 NOTE — Telephone Encounter (Signed)
Pt c/o BP issue: STAT if pt c/o blurred vision, one-sided weakness or slurred speech  1. What are your last 5 BP readings? Right arm BP 179/61, Left arm 153/69  2. Are you having any other symptoms (ex. Dizziness, headache, blurred vision, passed out)? No   3. What is your BP issue? Patient was told to call with BP and would like a call back to discuss medication change.  If there is a med change, she is requesting the new med be sent asap to her pharm so she can pick-up prior to snow

## 2023-11-12 NOTE — Telephone Encounter (Signed)
Spoke with patient and she states she was told to stop he amlodipine, monitor B and give [place] a call with readings. Below is her BP/HR 2 hours after her meication  2/13 115/45 hr 73 L-arm 136/48 hr 78  R-arm  2/15 120/65 66 L-arm  121/61 hr 64 R-arm  2/17 124/57 hr 61 L-arm  132/52 hr 63  Before her medications yesterday  2/18 164/64 hr 83 L-arm  172/66 hr 85 R-arm  Yesterday evening 100/56 hr 63 L-arm 147/68 hr 62 R-arm  2/19 before medication at 11 153/69 hr 77 L-arm 179/61 hr 75 R-arm

## 2023-11-13 ENCOUNTER — Other Ambulatory Visit: Payer: Self-pay

## 2023-11-13 MED ORDER — VERAPAMIL HCL ER 240 MG PO TBCR: 360.0000 mg | EXTENDED_RELEASE_TABLET | Freq: Every day | ORAL | Status: DC

## 2023-11-13 NOTE — Telephone Encounter (Signed)
Spoke with pt and went over recommendation of increasing Verapamil to 360 mg once daily and to be sure to monitor BP and HR and keep a log of the readings to give [place] in about 1 week. Pt stated she would use her current 240 mg and take 1.5 tablets to equal 360 mg because she just recently refilled this medication. Pt verbalized understanding of plan of care for now and had no further questions at this time.

## 2023-11-13 NOTE — Telephone Encounter (Signed)
Patient is following up regarding yesterday's BP report requesting a call back soon.

## 2023-11-17 ENCOUNTER — Encounter: Payer: Self-pay | Admitting: Physician Assistant

## 2023-11-17 ENCOUNTER — Telehealth: Payer: Self-pay | Admitting: Physical Medicine and Rehabilitation

## 2023-11-17 ENCOUNTER — Other Ambulatory Visit (INDEPENDENT_AMBULATORY_CARE_PROVIDER_SITE_OTHER): Payer: Medicare Other

## 2023-11-17 ENCOUNTER — Ambulatory Visit (INDEPENDENT_AMBULATORY_CARE_PROVIDER_SITE_OTHER): Payer: Medicare Other | Admitting: Physician Assistant

## 2023-11-17 DIAGNOSIS — M5440 Lumbago with sciatica, unspecified side: Secondary | ICD-10-CM

## 2023-11-17 MED ORDER — TRAMADOL HCL 50 MG PO TABS
50.0000 mg | ORAL_TABLET | Freq: Four times a day (QID) | ORAL | 0 refills | Status: DC | PRN
Start: 2023-11-17 — End: 2023-12-08

## 2023-11-17 NOTE — Progress Notes (Signed)
 Office Visit Note   Patient: [name]           Date of Birth: [birth year]           MRN: [id number] Visit Date: 11/17/2023              Requested by: Melida Quitter, MD [address] [place],  [place] [address] PCP: Melida Quitter, MD   Assessment & Plan: Visit Diagnoses:  1. Acute bilateral low back pain with sciatica, sciatica laterality unspecified     Plan: Pleasant 83 year old woman who is a patient of Dr. Christell Constant and Dr. Alvester Morin.  Fell down onto her back hard yesterday when she was trying to get something out of a cabinet.  Denies any paresthesias loss of bowel or bladder control but is complaining of focal low back pain.  She has had L5 transforaminal injections.  She is neurologically intact.  She has no other symptoms no loss of consciousness no headache no nausea vomiting she did findings and by x-ray consistent with an L3 compression fracture.  Will treat this symptomatically she can follow-up with myself or Dr. Christell Constant.  We did talk about her osteoporosis she has been diagnosed as osteopenia this is managed elsewhere and she does get Reclast on a regular basis she has been giving strict return precautions  Follow-Up Instructions: No follow-ups on file.   Orders:  No orders of the defined types were placed in this encounter.  No orders of the defined types were placed in this encounter.     Procedures: No procedures performed   Clinical Data: No additional findings.   Subjective: No chief complaint on file.   HPI [name] is a pleasant 83 year old woman who has been seen by Ellin Goodie as well as gotten shots with Dr. Alvester Morin comes in today after a fall yesterday in her kitchen.  She is not sure how hard she fell very hard.  She has pain both sides of her spine and into her right posterior hip.  She had been doing well up until that time she has been managing some of her back degenerative changes with Dr. Alvester Morin and transforaminal injections L5.  She is only supposed to  take Tylenol because of her blood pressure she did take 2 Advil yesterday she could not get a cup double position last night Review of Systems  All other systems reviewed and are negative.    Objective: Vital Signs: LMP 09/24/1979   Physical Exam Constitutional:      Appearance: Normal appearance.  Pulmonary:     Effort: Pulmonary effort is normal.  Skin:    General: Skin is warm and dry.  Neurological:     General: No focal deficit present.     Mental Status: She is alert and oriented to person, place, and time.  Psychiatric:        Mood and Affect: Mood normal.        Behavior: Behavior normal.     Ortho Exam She ambulates slowly with a cane has pain from going from a sitting to a standing position.  She has good sensation she has able to dorsiflex plantarflex her ankles negative straight leg raise strength is intact she does have tenderness over the lower spine but no ecchymosis redness no radicular findings Specialty Comments:  EXAM: MRI LUMBAR SPINE WITHOUT CONTRAST   TECHNIQUE: Multiplanar, multisequence MR imaging of the lumbar spine was performed. No intravenous contrast was administered.   COMPARISON:  None.  FINDINGS: Segmentation:  Standard.   Alignment:  Grade 1 anterolisthesis at L4-5   Vertebrae:  No fracture, evidence of discitis, or bone lesion.   Conus medullaris and cauda equina: Conus extends to the L1 level. Conus and cauda equina appear normal.   Paraspinal and other soft tissues: Negative.   Disc levels:   T12-L1: Disc desiccation with minimal bulge.  No stenosis.   L1-L2: Normal disc space and facet joints. No spinal canal stenosis. No neural foraminal stenosis.   L2-L3: Small disc bulge. No spinal canal stenosis. No neural foraminal stenosis.   L3-L4: Intermediate sized left asymmetric disc bulge with mild facet hypertrophy. Left lateral recess narrowing without central spinal canal stenosis. No neural foraminal stenosis.    L4-L5: Severe facet hypertrophy with left asymmetric disc bulge and superimposed right subarticular disc protrusion. Right lateral recess narrowing without central spinal canal stenosis. No neural foraminal stenosis.   L5-S1: Right asymmetric disc bulge. No spinal canal stenosis. Mild right neural foraminal stenosis.   Visualized sacrum: Normal.   IMPRESSION: 1. L4-L5 severe facet arthrosis with grade 1 anterolisthesis. Right lateral recess stenosis could contribute to right L5 radiculopathy. 2. L3-L4 left lateral recess stenosis could contribute to left L4 radiculopathy. 3. L5-S1 mild right neural foraminal stenosis.     Electronically Signed   By: Deatra Robinson M.D.   On: 06/22/2021 19:34  Imaging: No results found.   PMFS History: Patient Active Problem List   Diagnosis Date Noted   Right foot pain 04/23/2023   Unilateral primary osteoarthritis, right knee 02/19/2022   Encounter for general adult medical examination without abnormal findings 10/15/2021   Allergic rhinitis 01/11/2021   Chronic kidney disease, stage 3a (HCC) 01/11/2021   Collagenous colitis 01/11/2021   Hardening of the aorta (main artery of the heart) (HCC) 01/11/2021   Hyperlipidemia 01/11/2021   Primary localized osteoarthritis of pelvic region and thigh 01/11/2021   Urinary incontinence 01/11/2021   Unilateral primary osteoarthritis, right hip 05/02/2020   Low back pain 05/02/2020   Gaseous regurgitation- burping 07/26/2019   Other fatigue 07/26/2019   Irritable bowel syndrome type symptoms at this time with diarrhea 07/26/2019   Vertigo 03/24/2019   Glucose intolerance (impaired glucose tolerance) 02/17/2019   Vitamin D deficiency 09/30/2016   Obesity, Class I, BMI 30-34.9 09/30/2016   h/o Diverticulosis 06/07/2016   GERD (gastroesophageal reflux disease) 06/07/2016   Environmental and seasonal allergies 06/07/2016   Arthritis 06/07/2016   Moderate osteopenia 06/07/2016   Bilateral carotid  artery disease (HCC) 06/10/2012   Hypothyroidism, postsurgical 05/05/2012   Dyslipidemia- low HDL and inc VLDL & TG 04/04/2011   Benign hypertensive heart disease without heart failure 04/04/2011   Past Medical History:  Diagnosis Date   Arthritis    Back pain    had cortisone injection 6/15, Dr Cleophas Dunker   Carotid artery occlusion    Carotid bruit    Colitis, collagenous    Diverticulosis    GERD (gastroesophageal reflux disease)    Hyperlipidemia    Hypertension    Injury of left leg 06/05/12   Pt. fell   Peripheral vascular disease (HCC)    Shingles outbreak 2017   Thyroid disease 02-27-12   lt. thyroid nodule, bx. done 5 yrs ago-now some enlargement is seen.    Family History  Problem Relation Age of Onset   Heart disease Mother    Hypertension Mother    Arthritis Mother    Stroke Mother    Hyperlipidemia Mother    Atrial  fibrillation Mother    Heart disease Brother        before age 55   Heart attack Neg Hx    Breast cancer Neg Hx     Past Surgical History:  Procedure Laterality Date   ABDOMINAL HYSTERECTOMY  01/1980   CAROTID ENDARTERECTOMY  1998   Stable since surgery -slight build up returning-follows with yrly Dopplers   CATARACT EXTRACTION, BILATERAL  2004 or 2005 per pt   COLONOSCOPY     EYE SURGERY  02-27-12   Bil. Cataract surgery   THYROID LOBECTOMY  03/02/2012   Procedure: THYROID LOBECTOMY;  Surgeon: Velora Heckler, MD;  Location: [facility];  Service: General;  Laterality: Left;   Social History   Occupational History   Not on file  Tobacco Use   Smoking status: Former    Current packs/day: 0.00    Types: Cigarettes    Quit date: 09/24/1975    Years since quitting: 48.1   Smokeless tobacco: Never  Vaping Use   Vaping status: Never Used  Substance and Sexual Activity   Alcohol use: Yes    Alcohol/week: 1.0 standard drink of alcohol    Types: 1 Glasses of wine per week   Drug use: No   Sexual activity: Not Currently    Partners: Male    Birth  control/protection: Surgical    Comment: hysterectomy

## 2023-11-19 NOTE — Telephone Encounter (Signed)
 Patient called in requesting a call back from you. It is related to her bowel movements that she has not had since the compression fracture. She is very uncomfortable and would like to know what to do.

## 2023-11-24 ENCOUNTER — Other Ambulatory Visit: Payer: Self-pay | Admitting: Physician Assistant

## 2023-11-24 ENCOUNTER — Telehealth: Payer: Self-pay | Admitting: Physician Assistant

## 2023-11-24 MED ORDER — HYDROCODONE-ACETAMINOPHEN 5-325 MG PO TABS: 1.0000 | ORAL_TABLET | Freq: Four times a day (QID) | ORAL | 0 refills | Status: DC | PRN

## 2023-11-24 NOTE — Telephone Encounter (Signed)
 Pt called requesting a call back from [facility].Pt states PA Persons sent in medication for pain. Pt states medication is making her blood pressure raise and not helping with pain and need a different medication. Pt is asking for a call right away from [facility]. Please call pt at

## 2023-12-08 ENCOUNTER — Other Ambulatory Visit: Payer: Self-pay

## 2023-12-08 ENCOUNTER — Ambulatory Visit: Admitting: Orthopedic Surgery

## 2023-12-08 DIAGNOSIS — M5440 Lumbago with sciatica, unspecified side: Secondary | ICD-10-CM

## 2023-12-08 MED ORDER — HYDROCODONE-ACETAMINOPHEN 5-325 MG PO TABS: 1.0000 | ORAL_TABLET | Freq: Four times a day (QID) | ORAL | 0 refills | Status: AC | PRN

## 2023-12-08 NOTE — Progress Notes (Signed)
 Orthopedic Spine Surgery Office Note   Assessment: Patient is a 83 y.o. female with acute worsening of chronic low back pain. Has a compression fracture at L2     Plan: -Patient has tried tylenol, advil, tramadol, norco -Prescribed norco for her to use sparingly. Can continue with tylenol -Patient should return to office in 3 weeks, x-rays at next visit: AP/lateral lumbar     Patient expressed understanding of the plan and all questions were answered to the patient's satisfaction.    ___________________________________________________________________________     History:   Patient is a 83 y.o. female who presents today for acute worsening of chronic low back pain. She states she had a fall at her home about 3 weeks ago. She noted significant low back pain. She said it was the worse pain in her life. She said it has gotten notably better over the last 3 weeks. No radiating leg pain. Denies paresthesias and numbness.    Treatments tried: PT, Tylenol, lumbar steroid injections    Physical Exam:   General: no acute distress, appears stated age Neurologic: alert, answering questions appropriately, following commands Respiratory: unlabored breathing on room air, symmetric chest rise Psychiatric: appropriate affect, normal cadence to speech     MSK (spine):   -Strength exam                                                   Left                  Right EHL                              5/5                  5/5 TA                                 5/5                  5/5 GSC                             5/5                  5/5 Knee extension            5/5                  5/5 Hip flexion                    5/5                  5/5   -Sensory exam                           Sensation intact to light touch in L3-S1 nerve distributions of bilateral lower extremities   -Achilles DTR: 2/4 on the left, 2/4 on the right -Patellar DTR: 2/4 on the left, 2/4 on the right   -Straight leg  raise: negative bilaterally -Clonus: no beats bilaterally   Imaging: XR of the lumbar spine from 11/17/2023 were independently reviewed and interpreted,  showing multiple levels with decreased disc height. There is a L2 compression fracture which was not seen on prior films from 03/24/2023. Spondylolisthesis at L4/5.    MRI of the lumbar spine from 06/21/2021 was previously independently reviewed and interpreted, showing L4/5 anterolisthesis. Lateral recess stenosis at L4/5. DDD at L2/3, L3/4, L4/5.      Patient name: [name] Patient MRN: [id number] Date of visit: 12/08/23

## 2023-12-15 ENCOUNTER — Telehealth: Payer: Self-pay | Admitting: Internal Medicine

## 2023-12-15 DIAGNOSIS — I10 Essential (primary) hypertension: Secondary | ICD-10-CM

## 2023-12-15 MED ORDER — CHLORTHALIDONE 25 MG PO TABS: 25.0000 mg | ORAL_TABLET | Freq: Every day | ORAL | 1 refills | Status: DC

## 2023-12-15 NOTE — Telephone Encounter (Signed)
 Pt is calling in regards to wanting to speak with the Dr she stated he wanted her to F/U about her Blood pressure its too complex and she's been keeping track of for a month. Pt would also like to discuss medication and the heart valve issue  Pt would like a appt ASAP. She only wants to see Dr. Izora Ribas. Please advise

## 2023-12-15 NOTE — Telephone Encounter (Signed)
 Called pt advised of MD recommendation:She current takes hydrochlorothiazide we could transition to a stronger dose of medication called chlorthalidone. She would need a BMP and follow up one to two weeks after starting this medication to monitor potassium. Ultimately the follow up recommendations I discussed in my previous message hold true.   Pt advised to go to any [facility] in 1-2 weeks or f/u labs.  Pt expresses understanding; order placed and released for future draw.

## 2023-12-15 NOTE — Telephone Encounter (Signed)
 Spoke with the patient who reports that when she last saw Dr. Alvino Blood he stopped her amlodipine and increased her verapamil. She was told to monitor her blood pressure after this change. She states that soon after she made this change she had a fall that caused a compression fracture in her back. She states that she has been in a lot of pain which caused her blood pressure to be elevated. She reports the highest it got was 209/90. She states that they changed her pain medication so her pain is manageable now. She states that even though her pain is under control now she is still having elevated blood pressures in the morning. She reports that it is typically 170-180s/70s in morning prior to taking her medications. She then rechecks her pressure 2-3 hours later and it comes down to 140s/60s. She would like to know if she needs to adjust any of her medications. She states that a message can be left on her home phone with any changes that need to be made and medications can be sent to [facility].

## 2023-12-29 ENCOUNTER — Ambulatory Visit: Admitting: Orthopedic Surgery

## 2023-12-29 ENCOUNTER — Other Ambulatory Visit (INDEPENDENT_AMBULATORY_CARE_PROVIDER_SITE_OTHER): Payer: Self-pay

## 2023-12-29 DIAGNOSIS — M545 Low back pain, unspecified: Secondary | ICD-10-CM

## 2023-12-29 NOTE — Progress Notes (Signed)
 Orthopedic Spine Surgery Office Note   Assessment: Patient is a 83 y.o. female with L2 compression fracture, pain slowly improving (~6 weeks out from injury)     Plan: -Patient has tried tylenol, advil, tramadol, norco -Can continue with tylenol and use of Norco sparingly -No bending/lifting/twisting greater than 10 pounds -If she continues to do better, would consider PT at our next visit -Patient should return to office in 4 weeks, x-rays at next visit: AP/lateral lumbar     Patient expressed understanding of the plan and all questions were answered to the patient's satisfaction.    ___________________________________________________________________________     History:   Patient is a 83 y.o. female who presents today for follow-up on her L2 compression fracture.  She said she is doing better since she was last seen in the office.  Pain is improving.  She still notices that if she is walking or standing for more than a couple minutes time.  It is much better when she is sitting.  She also has some pain at night when she is rolling around in bed.  No pain radiating into either lower extremity.  Treatments tried: tylenol, advil, tramadol, norco     Physical Exam:   General: no acute distress, appears stated age Neurologic: alert, answering questions appropriately, following commands Respiratory: unlabored breathing on room air, symmetric chest rise Psychiatric: appropriate affect, normal cadence to speech     MSK (spine):   -Strength exam                                                   Left                  Right EHL                              5/5                  5/5 TA                                 5/5                  5/5 GSC                             5/5                  5/5 Knee extension            5/5                  5/5 Hip flexion                    5/5                  5/5   -Sensory exam                           Sensation intact to light touch in  L3-S1 nerve distributions of bilateral lower extremities   Imaging: XRs of the lumbar spine from 12/29/2023  were independently reviewed and interpreted,  showing multiple levels with decreased disc height. L2 compression fracture with increased height loss since last films. No new fractures seen. Spondylolisthesis at L4/5.    MRI of the lumbar spine from 06/21/2021 was previously independently reviewed and interpreted, showing L4/5 anterolisthesis. Lateral recess stenosis at L4/5. DDD at L2/3, L3/4, L4/5.      Patient name: [name] Patient MRN: [id number] Date of visit: 12/29/23

## 2023-12-30 ENCOUNTER — Telehealth: Payer: Self-pay | Admitting: Internal Medicine

## 2023-12-30 LAB — BASIC METABOLIC PANEL WITH GFR
BUN/Creatinine Ratio: 26 (ref 12–28)
BUN: 35 mg/dL — ABNORMAL HIGH (ref 8–27)
CO2: 21 mmol/L (ref 20–29)
Calcium: 10.5 mg/dL — ABNORMAL HIGH (ref 8.7–10.3)
Chloride: 99 mmol/L (ref 96–106)
Creatinine, Ser: 1.37 mg/dL — ABNORMAL HIGH (ref 0.57–1.00)
Glucose: 107 mg/dL — ABNORMAL HIGH (ref 70–99)
Potassium: 4.5 mmol/L (ref 3.5–5.2)
Sodium: 137 mmol/L (ref 134–144)
eGFR: 38 mL/min/{1.73_m2} — ABNORMAL LOW (ref >59)

## 2023-12-30 MED ORDER — VERAPAMIL HCL ER 240 MG PO TBCR: 360.0000 mg | EXTENDED_RELEASE_TABLET | Freq: Every day | ORAL | 2 refills | Status: DC

## 2023-12-30 NOTE — Telephone Encounter (Signed)
 Pt's medication was sent to pt's pharmacy as requested. Confirmation received.

## 2023-12-30 NOTE — Telephone Encounter (Signed)
*  STAT* If patient is at the pharmacy, call can be transferred to refill team.   1. Which medications need to be refilled? (please list name of each medication and dose if known) need a new prescription for Verapamil   2. Would you like to learn more about the convenience, safety, & potential cost savings by using the [facility]?     3. Are you open to using the [facility] (Type [facility].    4. Which pharmacy/location (including street and city if local pharmacy) is medication to be sent to? [facility] [place]   5. Do they need a 30 day or 90 day supply? 90 days and refills-

## 2024-01-01 ENCOUNTER — Other Ambulatory Visit (HOSPITAL_COMMUNITY): Payer: Self-pay | Admitting: *Deleted

## 2024-01-02 ENCOUNTER — Ambulatory Visit (HOSPITAL_COMMUNITY)
Admission: RE | Admit: 2024-01-02 | Discharge: 2024-01-02 | Disposition: A | Discharge status: Home / Self Care | Source: Ambulatory Visit | Attending: Internal Medicine | Admitting: Internal Medicine

## 2024-01-02 DIAGNOSIS — Z79620 Long term (current) use of immunosuppressive biologic: Secondary | ICD-10-CM | POA: Insufficient documentation

## 2024-01-02 DIAGNOSIS — M810 Age-related osteoporosis without current pathological fracture: Secondary | ICD-10-CM | POA: Insufficient documentation

## 2024-01-02 MED ORDER — DENOSUMAB 60 MG/ML ~~LOC~~ SOSY
PREFILLED_SYRINGE | SUBCUTANEOUS | Status: AC
  Administered 2024-01-02: 60 mg via SUBCUTANEOUS
  Filled 2024-01-02: qty 1

## 2024-01-02 MED ORDER — DENOSUMAB 60 MG/ML ~~LOC~~ SOSY: 60.0000 mg | PREFILLED_SYRINGE | Freq: Once | SUBCUTANEOUS | Status: AC

## 2024-01-29 ENCOUNTER — Other Ambulatory Visit (INDEPENDENT_AMBULATORY_CARE_PROVIDER_SITE_OTHER): Payer: Self-pay

## 2024-01-29 ENCOUNTER — Ambulatory Visit: Admitting: Orthopedic Surgery

## 2024-01-29 ENCOUNTER — Telehealth: Payer: Self-pay | Admitting: *Deleted

## 2024-01-29 DIAGNOSIS — M545 Low back pain, unspecified: Secondary | ICD-10-CM

## 2024-01-29 NOTE — Progress Notes (Signed)
 Orthopedic Spine Surgery Office Note   Assessment: Patient is a 83 y.o. female with L2 compression fracture, pain slowly improving (~10 weeks out from injury)     Plan: -Patient has tried tylenol , advil, tramadol , norco -Can continue with tylenol  up to 1000 mg 3 times per day -No bending/lifting/twisting greater than 10 pounds -She has done well with close to 90% relief that lasts for 4-5 months with injections in the past.  She is interested in repeat injection.  Referral provided to her today to Dr. Ona Bidding office -Patient should return to office in 6 weeks, x-rays at next visit: AP/lateral lumbar     Patient expressed understanding of the plan and all questions were answered to the patient's satisfaction.    ___________________________________________________________________________     History:   Patient is a 83 y.o. female who presents today for follow-up on her L2 compression fracture.  She has been doing better since she was last seen in the office.  She is no longer taking any narcotics.  She is using Tylenol  to control her pain.  She says she mostly has pain at night when twisting in bed or if she is standing or walking for more than 5 minutes.  She has not noticed any new symptoms since she was last seen in the office.  No pain radiating into either lower extremity.  She feels that her pain continues to improve.   Treatments tried: tylenol , advil, tramadol , norco     Physical Exam:   General: no acute distress, appears stated age Neurologic: alert, answering questions appropriately, following commands Respiratory: unlabored breathing on room air, symmetric chest rise Psychiatric: appropriate affect, normal cadence to speech     MSK (spine):   -Strength exam                                                   Left                  Right EHL                              5/5                  5/5 TA                                 5/5                  5/5 GSC                              5/5                  5/5 Knee extension            5/5                  5/5 Hip flexion                    5/5                  5/5   -Sensory exam  Sensation intact to light touch in L3-S1 nerve distributions of bilateral lower extremities   Imaging: XRs of the lumbar spine from 01/29/2024  were independently reviewed and interpreted, showing multiple levels with decreased disc height. L2 compression fracture with similar height loss when compared to prior films on 12/29/2023. No new fractures seen. Spondylolisthesis at L4/5.    MRI of the lumbar spine from 06/21/2021 was previously independently reviewed and interpreted, showing L4/5 anterolisthesis. Lateral recess stenosis at L4/5. DDD at L2/3, L3/4, L4/5.      Patient name: [name] Patient MRN: [id number] Date of visit: 01/29/24

## 2024-01-29 NOTE — Telephone Encounter (Signed)
 confirmed appt for 5/12 - included address and time - [facility] 01/29/24

## 2024-02-02 ENCOUNTER — Ambulatory Visit (INDEPENDENT_AMBULATORY_CARE_PROVIDER_SITE_OTHER): Payer: Medicare Other | Admitting: Otolaryngology

## 2024-02-02 ENCOUNTER — Ambulatory Visit (INDEPENDENT_AMBULATORY_CARE_PROVIDER_SITE_OTHER): Payer: Medicare Other | Admitting: Audiology

## 2024-02-02 ENCOUNTER — Encounter (INDEPENDENT_AMBULATORY_CARE_PROVIDER_SITE_OTHER): Payer: Self-pay | Admitting: Otolaryngology

## 2024-02-02 VITALS — BP 111/62 | HR 87

## 2024-02-02 DIAGNOSIS — H6123 Impacted cerumen, bilateral: Secondary | ICD-10-CM

## 2024-02-02 DIAGNOSIS — H903 Sensorineural hearing loss, bilateral: Secondary | ICD-10-CM

## 2024-02-02 NOTE — Progress Notes (Unsigned)
 Patient ID: [name], female   DOB: 09-May-1941, 83 y.o.   MRN: [id number]  Follow up: Hearing loss  HPI: The patient is an 83 year old female who returns today for her follow-up evaluation.  She was previously seen for bilateral hearing loss.  She has since been fitted with hearing aids.  She returns today reporting that her hearing aids are not working properly.  She denies any significant change in her hearing.  Currently she denies any otalgia, otorrhea, or vertigo.  Exam:  General: Communicates without difficulty, well nourished, no acute distress. Head: Normocephalic, no evidence injury, no tenderness, facial buttresses intact without stepoff. Eyes: PERRL, EOMI. No scleral icterus, conjunctivae clear. Neuro: CN II exam reveals vision grossly intact.  No nystagmus at any point of gaze. EAC: Bilateral cerumen impaction.  Under the operating microscope, the cerumen is carefully removed with a combination of cerumen currette, alligator forceps, and suction catheters.  After the cerumen is removed, the TMs are noted to be normal. Nose: External evaluation reveals normal support and skin without lesions.  Dorsum is intact.  Anterior rhinoscopy reveals congested mucosa over anterior aspect of inferior turbinates and intact septum.  No purulence noted. Oral:  Oral cavity and oropharynx are intact, symmetric, without erythema or edema.  Mucosa is moist without lesions. Neck: Full range of motion without pain.  There is no significant lymphadenopathy.  No masses palpable.  Thyroid  bed within normal limits to palpation.  Parotid glands and submandibular glands equal bilaterally without mass.  Trachea is midline. Neuro:  CN 2-12 grossly intact. Gait normal. Vestibular: No nystagmus at any point of gaze. The cerebellar examination is unremarkable.   Procedure: Bilateral cerumen disimpaction Anesthesia: None Description: Under the operating microscope, the cerumen is carefully removed with a combination of  cerumen currette, alligator forceps, and suction catheters.  After the cerumen is removed, the TMs are noted to be normal.  No mass, erythema, or lesions. The patient tolerated the procedure well.   Assessment: 1.  Bilateral cerumen impaction.  After the disimpaction procedure, both tympanic membranes and middle ear spaces are noted to be normal.  2.  Subjectively stable bilateral high-frequency sensorineural hearing loss.  Plan: 1.  The physical exam findings are reviewed with the patient. 2.  Otomicroscopy with bilateral cerumen disimpaction. 3.  Continue the use of her hearing aids.  The patient will see the audiologist for possible adjustment of her hearing aids. 4.  The patient will return for reevaluation in 1 year.

## 2024-02-03 DIAGNOSIS — H6123 Impacted cerumen, bilateral: Secondary | ICD-10-CM | POA: Insufficient documentation

## 2024-02-03 DIAGNOSIS — H903 Sensorineural hearing loss, bilateral: Secondary | ICD-10-CM | POA: Insufficient documentation

## 2024-02-09 ENCOUNTER — Telehealth: Payer: Self-pay | Admitting: Internal Medicine

## 2024-02-09 NOTE — Telephone Encounter (Signed)
 Dr Paulita Boss: I do think that getting rheumatologic work up makes sense.  Let's proceed with the plan as you discussed  Pt will call and let us  know over time how she is doing and if she is feeling better.

## 2024-02-09 NOTE — Telephone Encounter (Signed)
 Pt c/o medication issue:  1. Name of Medication:   chlorthalidone  (HYGROTON ) 25 MG tablet  verapamil  (CALAN -SR) 240 MG CR tablet   2. How are you currently taking this medication (dosage and times per day)?   As prescribed  3. Are you having a reaction (difficulty breathing--STAT)?  Epigastric discomfort, lightheadedness, dizziness  4. What is your medication issue?   Patient stated she had a medication change in February and while this had brought her BP down she is having lightheadedness/ dizziness and epigastric discomfort.  Patient wants to know if she can change the dosage of the verapamil  (CALAN -SR) 240 MG CR tablet twice daily.

## 2024-02-09 NOTE — Telephone Encounter (Signed)
 Pt called to report that she has been having extreme fatigue/ light headedness and lost of appetite, nausea for the past several weeks... she says her symptoms have been progressively is getting worse..  her Rheumatologist thinks it could be the Methotrexate so she just stopped it... he has been using it for 10 weeks.   Her BP has been 130/60-70.   She says she will contact her PCP about being seen sooner than her plan for the next few months.   She asked to let Dr Paulita Boss know just in case her foreseeing symptoms are related to her valve.... no edema or SOB.   Her last Echo:   Jann Melody, MD 11/02/2023  2:19 PM EST     Results: Increase in Mitral calc calcification likely related to age and time BP elevated Plan: Stop Norvasc  given that she is on verapamil ; BP checks off this medication.  If BP remains elevated, increased verapamil  to max dose

## 2024-02-15 ENCOUNTER — Telehealth: Payer: Self-pay | Admitting: Home Health

## 2024-02-15 NOTE — Telephone Encounter (Signed)
 Patient' daughter Buddie Carina called after-hours line today, reports patient had a fall several months ago and broke her vertebral, is overall sedentary and healing slowly.  She is also on methotrexate injection.  Patient has been diagnosed with mitral valve calcification from last visit. She has been feeling increasingly short of breath over the past several days, mostly with exertions, no chest pain, lightheadedness, syncope, leg edema, rapid weight gain.  Daughter is wondering what is causing patient's shortness of breath.  Daughter states patient is not consuming large amount of salt in diet and is not drinking excess amount of fluid.  Explained to the family that patient's shortness of breath can be multifactorial from deconditioning, methotrexate, or CHF, difficult to assess over the phone.  If she is overall stable with only exertional shortness of breath and able to maintain function, may call our office on Tuesday for urgent appointment for evaluation of CHF.  If she has progression of her shortness of breath, with increased SOB, hypoxia, weight gain, leg edema, would go to the nearest ER.

## 2024-02-16 ENCOUNTER — Inpatient Hospital Stay (HOSPITAL_COMMUNITY)
Admission: EM | Admit: 2024-02-16 | Discharge: 2024-02-19 | DRG: 205 | Disposition: A | Discharge status: Home / Self Care | Attending: Internal Medicine | Admitting: Internal Medicine

## 2024-02-16 ENCOUNTER — Observation Stay (HOSPITAL_COMMUNITY)

## 2024-02-16 ENCOUNTER — Emergency Department (HOSPITAL_COMMUNITY)

## 2024-02-16 ENCOUNTER — Other Ambulatory Visit: Payer: Self-pay

## 2024-02-16 ENCOUNTER — Encounter (HOSPITAL_COMMUNITY): Payer: Self-pay

## 2024-02-16 DIAGNOSIS — I779 Disorder of arteries and arterioles, unspecified: Secondary | ICD-10-CM | POA: Diagnosis present

## 2024-02-16 DIAGNOSIS — Z79890 Hormone replacement therapy (postmenopausal): Secondary | ICD-10-CM

## 2024-02-16 DIAGNOSIS — D631 Anemia in chronic kidney disease: Secondary | ICD-10-CM | POA: Diagnosis present

## 2024-02-16 DIAGNOSIS — J984 Other disorders of lung: Secondary | ICD-10-CM

## 2024-02-16 DIAGNOSIS — Z885 Allergy status to narcotic agent status: Secondary | ICD-10-CM

## 2024-02-16 DIAGNOSIS — E890 Postprocedural hypothyroidism: Secondary | ICD-10-CM | POA: Diagnosis present

## 2024-02-16 DIAGNOSIS — Z8249 Family history of ischemic heart disease and other diseases of the circulatory system: Secondary | ICD-10-CM

## 2024-02-16 DIAGNOSIS — J9601 Acute respiratory failure with hypoxia: Secondary | ICD-10-CM | POA: Diagnosis not present

## 2024-02-16 DIAGNOSIS — Z79899 Other long term (current) drug therapy: Secondary | ICD-10-CM

## 2024-02-16 DIAGNOSIS — Z1152 Encounter for screening for COVID-19: Secondary | ICD-10-CM

## 2024-02-16 DIAGNOSIS — Z87891 Personal history of nicotine dependence: Secondary | ICD-10-CM

## 2024-02-16 DIAGNOSIS — I1310 Hypertensive heart and chronic kidney disease without heart failure, with stage 1 through stage 4 chronic kidney disease, or unspecified chronic kidney disease: Secondary | ICD-10-CM | POA: Diagnosis present

## 2024-02-16 DIAGNOSIS — N179 Acute kidney failure, unspecified: Secondary | ICD-10-CM | POA: Diagnosis present

## 2024-02-16 DIAGNOSIS — I6523 Occlusion and stenosis of bilateral carotid arteries: Secondary | ICD-10-CM | POA: Diagnosis not present

## 2024-02-16 DIAGNOSIS — Z9841 Cataract extraction status, right eye: Secondary | ICD-10-CM

## 2024-02-16 DIAGNOSIS — T50905A Adverse effect of unspecified drugs, medicaments and biological substances, initial encounter: Secondary | ICD-10-CM

## 2024-02-16 DIAGNOSIS — R0602 Shortness of breath: Secondary | ICD-10-CM | POA: Diagnosis not present

## 2024-02-16 DIAGNOSIS — Z7982 Long term (current) use of aspirin: Secondary | ICD-10-CM

## 2024-02-16 DIAGNOSIS — Z6833 Body mass index (BMI) 33.0-33.9, adult: Secondary | ICD-10-CM

## 2024-02-16 DIAGNOSIS — M069 Rheumatoid arthritis, unspecified: Secondary | ICD-10-CM | POA: Diagnosis present

## 2024-02-16 DIAGNOSIS — Z90710 Acquired absence of both cervix and uterus: Secondary | ICD-10-CM

## 2024-02-16 DIAGNOSIS — I119 Hypertensive heart disease without heart failure: Secondary | ICD-10-CM | POA: Diagnosis not present

## 2024-02-16 DIAGNOSIS — I739 Peripheral vascular disease, unspecified: Secondary | ICD-10-CM | POA: Diagnosis present

## 2024-02-16 DIAGNOSIS — N1831 Chronic kidney disease, stage 3a: Secondary | ICD-10-CM | POA: Diagnosis present

## 2024-02-16 DIAGNOSIS — R0902 Hypoxemia: Principal | ICD-10-CM

## 2024-02-16 DIAGNOSIS — J704 Drug-induced interstitial lung disorders, unspecified: Principal | ICD-10-CM | POA: Diagnosis present

## 2024-02-16 DIAGNOSIS — Z8611 Personal history of tuberculosis: Secondary | ICD-10-CM

## 2024-02-16 DIAGNOSIS — T451X5A Adverse effect of antineoplastic and immunosuppressive drugs, initial encounter: Secondary | ICD-10-CM | POA: Diagnosis present

## 2024-02-16 DIAGNOSIS — Z823 Family history of stroke: Secondary | ICD-10-CM

## 2024-02-16 DIAGNOSIS — D75839 Thrombocytosis, unspecified: Secondary | ICD-10-CM | POA: Diagnosis present

## 2024-02-16 DIAGNOSIS — K219 Gastro-esophageal reflux disease without esophagitis: Secondary | ICD-10-CM | POA: Diagnosis present

## 2024-02-16 DIAGNOSIS — E66811 Obesity, class 1: Secondary | ICD-10-CM | POA: Diagnosis present

## 2024-02-16 DIAGNOSIS — Z9842 Cataract extraction status, left eye: Secondary | ICD-10-CM

## 2024-02-16 DIAGNOSIS — Z8261 Family history of arthritis: Secondary | ICD-10-CM

## 2024-02-16 DIAGNOSIS — K52831 Collagenous colitis: Secondary | ICD-10-CM | POA: Diagnosis present

## 2024-02-16 DIAGNOSIS — E876 Hypokalemia: Secondary | ICD-10-CM | POA: Diagnosis present

## 2024-02-16 DIAGNOSIS — E785 Hyperlipidemia, unspecified: Secondary | ICD-10-CM | POA: Diagnosis present

## 2024-02-16 DIAGNOSIS — Z8349 Family history of other endocrine, nutritional and metabolic diseases: Secondary | ICD-10-CM

## 2024-02-16 DIAGNOSIS — Z888 Allergy status to other drugs, medicaments and biological substances status: Secondary | ICD-10-CM

## 2024-02-16 LAB — CBC WITH DIFFERENTIAL/PLATELET
Abs Immature Granulocytes: 0.14 10*3/uL — ABNORMAL HIGH (ref 0.00–0.07)
Basophils Absolute: 0.1 10*3/uL (ref 0.0–0.1)
Basophils Relative: 0 %
Eosinophils Absolute: 0.2 10*3/uL (ref 0.0–0.5)
Eosinophils Relative: 1 %
HCT: 30.7 % — ABNORMAL LOW (ref 36.0–46.0)
Hemoglobin: 10.2 g/dL — ABNORMAL LOW (ref 12.0–15.0)
Immature Granulocytes: 1 %
Lymphocytes Relative: 13 %
Lymphs Abs: 2.1 10*3/uL (ref 0.7–4.0)
MCH: 32.1 pg (ref 26.0–34.0)
MCHC: 33.2 g/dL (ref 30.0–36.0)
MCV: 96.5 fL (ref 80.0–100.0)
Monocytes Absolute: 2 10*3/uL — ABNORMAL HIGH (ref 0.1–1.0)
Monocytes Relative: 12 %
Neutro Abs: 12.2 10*3/uL — ABNORMAL HIGH (ref 1.7–7.7)
Neutrophils Relative %: 73 %
Platelets: 554 10*3/uL — ABNORMAL HIGH (ref 150–400)
RBC: 3.18 MIL/uL — ABNORMAL LOW (ref 3.87–5.11)
RDW: 17.6 % — ABNORMAL HIGH (ref 11.5–15.5)
WBC: 16.7 10*3/uL — ABNORMAL HIGH (ref 4.0–10.5)
nRBC: 0.4 % — ABNORMAL HIGH (ref 0.0–0.2)

## 2024-02-16 LAB — BRAIN NATRIURETIC PEPTIDE: B Natriuretic Peptide: 173.4 pg/mL — ABNORMAL HIGH (ref 0.0–100.0)

## 2024-02-16 LAB — COMPREHENSIVE METABOLIC PANEL WITH GFR
ALT: 32 U/L (ref 0–44)
AST: 37 U/L (ref 15–41)
Albumin: 2.9 g/dL — ABNORMAL LOW (ref 3.5–5.0)
Alkaline Phosphatase: 51 U/L (ref 38–126)
Anion gap: 17 — ABNORMAL HIGH (ref 5–15)
BUN: 31 mg/dL — ABNORMAL HIGH (ref 8–23)
CO2: 17 mmol/L — ABNORMAL LOW (ref 22–32)
Calcium: 9.3 mg/dL (ref 8.9–10.3)
Chloride: 98 mmol/L (ref 98–111)
Creatinine, Ser: 1.73 mg/dL — ABNORMAL HIGH (ref 0.44–1.00)
GFR, Estimated: 29 mL/min — ABNORMAL LOW (ref >60)
Glucose, Bld: 114 mg/dL — ABNORMAL HIGH (ref 70–99)
Potassium: 2.9 mmol/L — ABNORMAL LOW (ref 3.5–5.1)
Sodium: 132 mmol/L — ABNORMAL LOW (ref 135–145)
Total Bilirubin: 0.9 mg/dL (ref 0.0–1.2)
Total Protein: 6.3 g/dL — ABNORMAL LOW (ref 6.5–8.1)

## 2024-02-16 LAB — MAGNESIUM: Magnesium: 1.7 mg/dL (ref 1.7–2.4)

## 2024-02-16 LAB — PROCALCITONIN: Procalcitonin: 0.17 ng/mL

## 2024-02-16 LAB — D-DIMER, QUANTITATIVE: D-Dimer, Quant: 2.41 ug{FEU}/mL — ABNORMAL HIGH (ref 0.00–0.50)

## 2024-02-16 MED ORDER — ROSUVASTATIN CALCIUM 20 MG PO TABS
20.0000 mg | ORAL_TABLET | Freq: Every day | ORAL | Status: DC
  Administered 2024-02-17 – 2024-02-19 (×3): 20 mg via ORAL
  Filled 2024-02-16 (×3): qty 1

## 2024-02-16 MED ORDER — VERAPAMIL HCL ER 180 MG PO TBCR
360.0000 mg | EXTENDED_RELEASE_TABLET | Freq: Every day | ORAL | Status: DC
Start: 2024-02-17 — End: 2024-02-19
  Administered 2024-02-17 – 2024-02-19 (×3): 360 mg via ORAL
  Filled 2024-02-16 (×3): qty 2

## 2024-02-16 MED ORDER — ASPIRIN 81 MG PO TBEC
81.0000 mg | DELAYED_RELEASE_TABLET | Freq: Every day | ORAL | Status: DC
  Administered 2024-02-17 – 2024-02-19 (×3): 81 mg via ORAL
  Filled 2024-02-16 (×3): qty 1

## 2024-02-16 MED ORDER — LEVOTHYROXINE SODIUM 112 MCG PO TABS
112.0000 ug | ORAL_TABLET | Freq: Every day | ORAL | Status: DC
  Administered 2024-02-17 – 2024-02-19 (×3): 112 ug via ORAL
  Filled 2024-02-16 (×3): qty 1

## 2024-02-16 MED ORDER — SODIUM CHLORIDE 0.9% FLUSH
3.0000 mL | Freq: Two times a day (BID) | INTRAVENOUS | Status: DC
  Administered 2024-02-16 – 2024-02-19 (×7): 3 mL via INTRAVENOUS

## 2024-02-16 MED ORDER — POTASSIUM CHLORIDE CRYS ER 20 MEQ PO TBCR
40.0000 meq | EXTENDED_RELEASE_TABLET | Freq: Once | ORAL | Status: AC
  Administered 2024-02-16: 40 meq via ORAL
  Filled 2024-02-16: qty 2

## 2024-02-16 MED ORDER — MIRABEGRON ER 25 MG PO TB24
25.0000 mg | ORAL_TABLET | Freq: Every day | ORAL | Status: DC
  Administered 2024-02-17 – 2024-02-19 (×3): 25 mg via ORAL
  Filled 2024-02-16 (×3): qty 1

## 2024-02-16 MED ORDER — ACETAMINOPHEN 650 MG RE SUPP: 650.0000 mg | Freq: Four times a day (QID) | RECTAL | Status: DC | PRN

## 2024-02-16 MED ORDER — ACETAMINOPHEN 325 MG PO TABS
650.0000 mg | ORAL_TABLET | Freq: Four times a day (QID) | ORAL | Status: DC | PRN
  Administered 2024-02-16: 650 mg via ORAL
  Filled 2024-02-16: qty 2

## 2024-02-16 MED ORDER — HEPARIN SODIUM (PORCINE) 5000 UNIT/ML IJ SOLN
5000.0000 [IU] | Freq: Three times a day (TID) | INTRAMUSCULAR | Status: DC
  Administered 2024-02-16 – 2024-02-19 (×9): 5000 [IU] via SUBCUTANEOUS
  Filled 2024-02-16 (×9): qty 1

## 2024-02-16 MED ORDER — METOPROLOL SUCCINATE ER 100 MG PO TB24
100.0000 mg | ORAL_TABLET | Freq: Every day | ORAL | Status: DC
  Administered 2024-02-17 – 2024-02-19 (×3): 100 mg via ORAL
  Filled 2024-02-16 (×3): qty 1

## 2024-02-16 MED ORDER — POLYETHYLENE GLYCOL 3350 17 G PO PACK: 17.0000 g | PACK | Freq: Every day | ORAL | Status: DC | PRN

## 2024-02-16 MED ORDER — FAMOTIDINE 20 MG PO TABS: 20.0000 mg | ORAL_TABLET | Freq: Two times a day (BID) | ORAL | Status: DC | PRN

## 2024-02-16 NOTE — ED Notes (Signed)
 CCMD contacted to admit the patient for cardiac monitoring.

## 2024-02-16 NOTE — ED Provider Notes (Signed)
 [place] [facility] AT [facility] Provider Note   CSN: [id number] Arrival date & time: 02/16/24  1020     History  Chief Complaint  Patient presents with   Shortness of Breath    [name] is a 83 y.o. female.  Patient brought in by EMS.  Patient been having shortness of breath for about 3 weeks.  But got worse this week.  Patient was on methotrexate for arthritis.  She stopped that about a week ago.  She did have some vomiting yesterday.  Patient does not have a history of COPD or CHF.  Patient's oxygen sats that at home was 70% on room air on nonrebreather she was at 100% here.  We dropped her down to 3 L and she has remained in the mid 90% with that.  And is comfortable and talking fine.  EMS gave her Solu-Medrol  and a DuoNeb and route.  Patient does have a little bit of a productive cough with some white sputum.  Patient says she has had exertional shortness of breath.  Patient is not on home oxygen.  Worked her up before but I did not past medical history sniffing for hyperlipidemia hypertension arthritis thyroid  disease peripheral vascular disease.  Patient had a carotid endarterectomy in 89 thyroid  lobectomy in 2013.  Patient is a former smoker quit in 1977.  Patient also denies any leg swelling.       Home Medications Prior to Admission medications   Medication Sig Start Date End Date Taking? Authorizing Provider  acetaminophen  (TYLENOL ) 500 MG tablet Take 1,000 mg by mouth 2 (two) times daily.   Yes [provider]  aspirin 81 MG EC tablet Take 81 mg by mouth daily.   Yes [provider]  B Complex Vitamins (VITAMIN-B COMPLEX PO) Take 1 tablet by mouth daily with breakfast.   Yes [provider]  calcium  carbonate (TUMS - DOSED IN MG ELEMENTAL CALCIUM ) 500 MG chewable tablet Chew 1 tablet by mouth daily as needed for indigestion or heartburn.   Yes [provider]  chlorthalidone  (HYGROTON ) 25 MG tablet Take 1  tablet (25 mg total) by mouth daily. 12/15/23  Yes Chandrasekhar, Mahesh A, MD  Cholecalciferol (VITAMIN D3) 50 MCG (2000 UT) CAPS Take 2,000 Units by mouth 3 (three) times a week. Tuesdays, Thursdays and Saturdays   Yes [provider]  dimenhyDRINATE (DRAMAMINE) 50 MG tablet Take 50 mg by mouth every 8 (eight) hours as needed for dizziness or nausea.   Yes [provider]  famotidine (PEPCID) 20 MG tablet Take 1 tablet by mouth as needed.   Yes [provider]  folic acid (FOLVITE) 1 MG tablet Take 1 mg by mouth daily. 09/09/23  Yes [provider]  GEMTESA  75 MG TABS TAKE 1 TABLET BY MOUTH DAILY 08/08/23  Yes Lillian Rein, MD  KRILL OIL PO Take 1 tablet by mouth daily.   Yes [provider]  levothyroxine  (SYNTHROID ) 112 MCG tablet Take 1 tablet (112 mcg total) by mouth daily. 10/03/20  Yes Abonza, Maritza, PA-C  loratadine (CLARITIN) 10 MG tablet Take 10 mg by mouth daily as needed for allergies.   Yes [provider]  metoprolol  succinate (TOPROL -XL) 100 MG 24 hr tablet TAKE 1 TABLET BY MOUTH DAILY 08/06/23  Yes Chandrasekhar, Mahesh A, MD  Multiple Vitamin (MULTIVITAMIN) tablet Take 1 tablet by mouth daily.     Yes [provider]  polyethylene glycol (MIRALAX / GLYCOLAX) 17 g packet  Take 17 g by mouth every evening.   Yes [provider]  rosuvastatin  (CRESTOR ) 20 MG tablet TAKE 1 TABLET BY MOUTH DAILY 07/10/23  Yes Lucendia Rusk, MD  telmisartan  (MICARDIS ) 80 MG tablet Take 1 tablet (80 mg total) by mouth daily. 10/21/23  Yes Chandrasekhar, Mahesh A, MD  verapamil  (CALAN -SR) 240 MG CR tablet Take 1.5 tablets (360 mg total) by mouth daily with breakfast. 12/30/23  Yes Chandrasekhar, Mahesh A, MD  estradiol  (ESTRACE ) 0.1 MG/GM vaginal cream Apply to skin externally nightly for the next 7 days and then decrease to using twice weekly. Patient not taking: Reported on 02/16/2024 07/31/22   Lillian Rein, MD  furosemide   (LASIX ) 40 MG tablet Take 1 tablet (40 mg total) by mouth daily as needed for edema. Patient not taking: Reported on 02/16/2024 08/13/21   Lucendia Rusk, MD  Methotrexate Sodium (METHOTREXATE, PF,) 50 MG/2ML injection 0.8 mLs once a week. Patient not taking: Reported on 02/16/2024 02/05/24   [provider]      Allergies    Zebeta, Ace inhibitors, and Hydroxychloroquine sulfate    Review of Systems   Review of Systems  Constitutional:  Negative for chills and fever.  HENT:  Negative for ear pain and sore throat.   Eyes:  Negative for pain and visual disturbance.  Respiratory:  Positive for shortness of breath. Negative for cough.   Cardiovascular:  Negative for chest pain, palpitations and leg swelling.  Gastrointestinal:  Negative for abdominal pain and vomiting.  Genitourinary:  Negative for dysuria and hematuria.  Musculoskeletal:  Negative for arthralgias and back pain.  Skin:  Negative for color change and rash.  Neurological:  Negative for seizures and syncope.  All other systems reviewed and are negative.   Physical Exam Updated Vital Signs BP 127/65 (BP Location: Right Arm)   Pulse 92   Temp (!) 97.4 F (36.3 C) (Oral)   Resp 16   Ht 1.524 m (5')   Wt 82.8 kg   LMP 09/24/1979   SpO2 100%   BMI 35.65 kg/m  Physical Exam Vitals and nursing note reviewed.  Constitutional:      General: She is not in acute distress.    Appearance: Normal appearance. She is well-developed.  HENT:     Head: Normocephalic and atraumatic.  Eyes:     Extraocular Movements: Extraocular movements intact.     Conjunctiva/sclera: Conjunctivae normal.     Pupils: Pupils are equal, round, and reactive to light.  Cardiovascular:     Rate and Rhythm: Normal rate and regular rhythm.     Heart sounds: No murmur heard. Pulmonary:     Effort: Pulmonary effort is normal. No respiratory distress.     Breath sounds: Rhonchi present.  Abdominal:     Palpations: Abdomen is soft.      Tenderness: There is no abdominal tenderness.  Musculoskeletal:        General: No swelling.     Cervical back: Neck supple.     Right lower leg: No edema.     Left lower leg: No edema.  Skin:    General: Skin is warm and dry.     Capillary Refill: Capillary refill takes less than 2 seconds.  Neurological:     General: No focal deficit present.     Mental Status: She is alert and oriented to person, place, and time.     Cranial Nerves: No cranial nerve deficit.     Sensory: No sensory deficit.  Motor: No weakness.  Psychiatric:        Mood and Affect: Mood normal.     ED Results / Procedures / Treatments   Labs (all labs ordered are listed, but only abnormal results are displayed) Labs Reviewed  CBC WITH DIFFERENTIAL/PLATELET - Abnormal; Notable for the following components:      Result Value   WBC 16.7 (*)    RBC 3.18 (*)    Hemoglobin 10.2 (*)    HCT 30.7 (*)    RDW 17.6 (*)    Platelets 554 (*)    nRBC 0.4 (*)    Neutro Abs 12.2 (*)    Monocytes Absolute 2.0 (*)    Abs Immature Granulocytes 0.14 (*)    All other components within normal limits  COMPREHENSIVE METABOLIC PANEL WITH GFR - Abnormal; Notable for the following components:   Sodium 132 (*)    Potassium 2.9 (*)    CO2 17 (*)    Glucose, Bld 114 (*)    BUN 31 (*)    Creatinine, Ser 1.73 (*)    Total Protein 6.3 (*)    Albumin 2.9 (*)    GFR, Estimated 29 (*)    Anion gap 17 (*)    All other components within normal limits  BRAIN NATRIURETIC PEPTIDE - Abnormal; Notable for the following components:   B Natriuretic Peptide 173.4 (*)    All other components within normal limits    EKG EKG Interpretation Date/Time:  Monday Feb 16 2024 10:30:12 EDT Ventricular Rate:  93 PR Interval:  178 QRS Duration:  93 QT Interval:  354 QTC Calculation: 441 R Axis:   -19  Text Interpretation: Sinus rhythm Borderline left axis deviation Borderline T wave abnormalities No significant change since last tracing  Confirmed by Phaedra Colgate [phone]) on 02/16/2024 10:33:26 AM  Radiology DG Chest Port 1 View Result Date: 02/16/2024 CLINICAL DATA:  Shortness of breath.  Productive cough. EXAM: PORTABLE CHEST 1 VIEW COMPARISON:  08/14/2021 FINDINGS: The heart size and mediastinal contours are within normal limits. No focal consolidation or pleural effusion. The visualized skeletal structures are unremarkable. IMPRESSION: No active disease. Electronically Signed   By: Marlyce Sine M.D.   On: 02/16/2024 11:12    Procedures Procedures    Medications Ordered in ED Medications  potassium chloride  SA (KLOR-CON  M) CR tablet 40 mEq (has no administration in time range)    ED Course/ Medical Decision Making/ A&P                                 Medical Decision Making Amount and/or Complexity of Data Reviewed Labs: ordered. Radiology: ordered.   Patient now satting well on just 3 L of oxygen.  CBC white count 16.7 hemoglobin 10.2 platelets 554.  Complete metabolic panel sodium 132 potassium low at 2.9 CO2 17 glucose 114.  Creatinine 1.73.  GFR 29.  LFTs are normal.  Anion gap 17.  BNP up a little bit at 173.  Portable chest x-ray shows no evidence of any active disease.  But with her GFR being as low as it is patient cannot have CT angio chest to rule out PE.  Patient will need admission for the hypoxia.  Will correct her potassium.  Will give some oral potassium.  But in the face with acute kidney injury we will just give 1 dose of 40 mill equivalents p.o.  Patient is followed by [facility] medical.  Will get her admitted.  Patient is currently satting 97% on 3 L of oxygen.   Final Clinical Impression(s) / ED Diagnoses Final diagnoses:  Hypoxia  SOB (shortness of breath)  AKI (acute kidney injury) [facility])    Rx / DC Orders ED Discharge Orders     None         Nicklas Barns, MD 02/16/24 1312

## 2024-02-16 NOTE — H&P (Signed)
 History and Physical   [name] MWU:[id number] DOB: March 13, 1941 DOA: 02/16/2024  PCP: Azalia Leo, MD   Patient coming from: Home  Chief Complaint: SOB  HPI: [name] is a 83 y.o. female with medical history significant of hypertension, hyperlipidemia, hypothyroidism, GERD, CKD 3A, carotid artery disease, peripheral vascular disease, obesity, IBS, arthritis presenting with worsening shortness of breath.  Patient reports several weeks of shortness of breath.  States this has been getting worse over the past 1 week especially on exertion.  She reports some associated nausea and vomiting yesterday.  Patient reports that she stopped her methotrexate a week ago as there was some thought this could be causing her symptoms.  Does report a recent productive cough.  EMS called today and patient noted to be saturating 70% on room air and initially placed on nonrebreather and received Solu-Medrol  and DuoNeb.  More sedentary lifestyle recently secondary to L3 in February.  Also had her hydrochlorothiazide  changed to chlorthalidone  and her amlodipine  discontinued in favor of verapamil  in the last month.  Denies fevers, chills, chest pain, abdominal pain, constipation, diarrhea.  ED Course: Vital signs in the ED notable for blood pressure in the 110s-120 systolic, respiratory rate in the 20s, requiring 3 L to maintain saturations.  Lab workup included CMP with sodium 132, potassium 2.9, bicarb 17, gap 17, BUN 31, creatinine elevated to 1.73 from 1.37 one-month ago, protein 6.3, albumin 2.9.  CBC revealed 15.0, hemoglobin 10.2, platelets 554.  BNP indeterminate at 173.  Chest x-ray showed no acute abnormality but appear to have increased interstitial markings.  Patient received 40 mEq p.o. potassium in the ED.  Review of Systems: As per HPI otherwise all other systems reviewed and are negative.  Past Medical History:  Diagnosis Date   Arthritis    Back pain    had cortisone injection  6/15, Dr Aviva Lemmings   Carotid artery occlusion    Carotid bruit    Colitis, collagenous    Diverticulosis    GERD (gastroesophageal reflux disease)    Hyperlipidemia    Hypertension    Injury of left leg 06/05/12   Pt. fell   Peripheral vascular disease (HCC)    Shingles outbreak 2017   Thyroid  disease 02-27-12   lt. thyroid  nodule, bx. done 5 yrs ago-now some enlargement is seen.    Past Surgical History:  Procedure Laterality Date   ABDOMINAL HYSTERECTOMY  01/1980   CAROTID ENDARTERECTOMY  1998   Stable since surgery -slight build up returning-follows with yrly Dopplers   CATARACT EXTRACTION, BILATERAL  2004 or 2005 per pt   COLONOSCOPY     EYE SURGERY  02-27-12   Bil. Cataract surgery   THYROID  LOBECTOMY  03/02/2012   Procedure: THYROID  LOBECTOMY;  Surgeon: Keitha Pata, MD;  Location: [facility];  Service: General;  Laterality: Left;    Social History  reports that she quit smoking about 48 years ago. Her smoking use included cigarettes. She has never used smokeless tobacco. She reports current alcohol use of about 1.0 standard drink of alcohol per week. She reports that she does not use drugs.  Allergies  Allergen Reactions   Zebeta Other (See Comments)    Does not work, per patient.   Ace Inhibitors Other (See Comments)    cough   Hydroxychloroquine Sulfate Rash    Family History  Problem Relation Age of Onset   Heart disease Mother    Hypertension Mother    Arthritis Mother  Stroke Mother    Hyperlipidemia Mother    Atrial fibrillation Mother    Heart disease Brother        before age 85   Heart attack Neg Hx    Breast cancer Neg Hx   Reviewed on admission  Prior to Admission medications   Medication Sig Start Date End Date Taking? Authorizing Provider  acetaminophen  (TYLENOL ) 500 MG tablet Take 1,000 mg by mouth 2 (two) times daily.   Yes [provider]  aspirin 81 MG EC tablet Take 81 mg by mouth daily.   Yes [provider]  B Complex  Vitamins (VITAMIN-B COMPLEX PO) Take 1 tablet by mouth daily with breakfast.   Yes [provider]  calcium  carbonate (TUMS - DOSED IN MG ELEMENTAL CALCIUM ) 500 MG chewable tablet Chew 1 tablet by mouth daily as needed for indigestion or heartburn.   Yes [provider]  chlorthalidone  (HYGROTON ) 25 MG tablet Take 1 tablet (25 mg total) by mouth daily. 12/15/23  Yes Chandrasekhar, Mahesh A, MD  Cholecalciferol (VITAMIN D3) 50 MCG (2000 UT) CAPS Take 2,000 Units by mouth 3 (three) times a week. Tuesdays, Thursdays and Saturdays   Yes [provider]  dimenhyDRINATE (DRAMAMINE) 50 MG tablet Take 50 mg by mouth every 8 (eight) hours as needed for dizziness or nausea.   Yes [provider]  famotidine (PEPCID) 20 MG tablet Take 1 tablet by mouth as needed.   Yes [provider]  folic acid (FOLVITE) 1 MG tablet Take 1 mg by mouth daily. 09/09/23  Yes [provider]  GEMTESA  75 MG TABS TAKE 1 TABLET BY MOUTH DAILY 08/08/23  Yes Lillian Rein, MD  KRILL OIL PO Take 1 tablet by mouth daily.   Yes [provider]  levothyroxine  (SYNTHROID ) 112 MCG tablet Take 1 tablet (112 mcg total) by mouth daily. 10/03/20  Yes Abonza, Maritza, PA-C  loratadine (CLARITIN) 10 MG tablet Take 10 mg by mouth daily as needed for allergies.   Yes [provider]  metoprolol  succinate (TOPROL -XL) 100 MG 24 hr tablet TAKE 1 TABLET BY MOUTH DAILY 08/06/23  Yes Chandrasekhar, Mahesh A, MD  Multiple Vitamin (MULTIVITAMIN) tablet Take 1 tablet by mouth daily.     Yes [provider]  polyethylene glycol (MIRALAX / GLYCOLAX) 17 g packet Take 17 g by mouth every evening.   Yes [provider]  rosuvastatin  (CRESTOR ) 20 MG tablet TAKE 1 TABLET BY MOUTH DAILY 07/10/23  Yes Lucendia Rusk, MD  telmisartan  (MICARDIS ) 80 MG tablet Take 1 tablet (80 mg total) by mouth daily. 10/21/23  Yes Chandrasekhar, Mahesh A, MD  verapamil  (CALAN -SR) 240 MG CR  tablet Take 1.5 tablets (360 mg total) by mouth daily with breakfast. 12/30/23  Yes Chandrasekhar, Mahesh A, MD  estradiol  (ESTRACE ) 0.1 MG/GM vaginal cream Apply to skin externally nightly for the next 7 days and then decrease to using twice weekly. Patient not taking: Reported on 02/16/2024 07/31/22   Lillian Rein, MD  furosemide  (LASIX ) 40 MG tablet Take 1 tablet (40 mg total) by mouth daily as needed for edema. Patient not taking: Reported on 02/16/2024 08/13/21   Lucendia Rusk, MD  Methotrexate Sodium (METHOTREXATE, PF,) 50 MG/2ML injection 0.8 mLs once a week. Patient not taking: Reported on 02/16/2024 02/05/24   [provider]    Physical Exam: Vitals:   02/16/24 1031 02/16/24 1032 02/16/24 1033 02/16/24 1315  BP:  127/65  116/71  Pulse:  92  72  Resp:  16  (!) 24  Temp:      TempSrc:      SpO2: 100% 100%  96%  Weight:   82.8 kg   Height:   5' (1.524 m)     Physical Exam Constitutional:      General: She is not in acute distress.    Appearance: Normal appearance.  HENT:     Head: Normocephalic and atraumatic.     Mouth/Throat:     Mouth: Mucous membranes are moist.     Pharynx: Oropharynx is clear.  Eyes:     Extraocular Movements: Extraocular movements intact.     Pupils: Pupils are equal, round, and reactive to light.  Cardiovascular:     Rate and Rhythm: Normal rate and regular rhythm.     Pulses: Normal pulses.     Heart sounds: Normal heart sounds.  Pulmonary:     Effort: Pulmonary effort is normal. No respiratory distress.     Breath sounds: Rales present.  Abdominal:     General: Bowel sounds are normal. There is no distension.     Palpations: Abdomen is soft.     Tenderness: There is no abdominal tenderness.  Musculoskeletal:        General: No swelling or deformity.     Right lower leg: No edema.     Left lower leg: No edema.  Skin:    General: Skin is warm and dry.  Neurological:     General: No focal deficit present.     Mental  Status: Mental status is at baseline.    Labs on Admission: I have personally reviewed following labs and imaging studies  CBC: Recent Labs  Lab 02/16/24 1038  WBC 16.7*  NEUTROABS 12.2*  HGB 10.2*  HCT 30.7*  MCV 96.5  PLT 554*    Basic Metabolic Panel: Recent Labs  Lab 02/16/24 1038  NA 132*  K 2.9*  CL 98  CO2 17*  GLUCOSE 114*  BUN 31*  CREATININE 1.73*  CALCIUM  9.3    GFR: Estimated Creatinine Clearance: 23.1 mL/min (A) (by C-G formula based on SCr of 1.73 mg/dL (H)).  Liver Function Tests: Recent Labs  Lab 02/16/24 1038  AST 37  ALT 32  ALKPHOS 51  BILITOT 0.9  PROT 6.3*  ALBUMIN 2.9*    Urine analysis:    Component Value Date/Time   BILIRUBINUR neg 07/29/2019 0954   PROTEINUR Negative 07/29/2019 0954   UROBILINOGEN 0.2 07/29/2019 0954   NITRITE neg 07/29/2019 0954   LEUKOCYTESUR Negative 07/29/2019 0954    Radiological Exams on Admission: DG Chest Port 1 View Result Date: 02/16/2024 CLINICAL DATA:  Shortness of breath.  Productive cough. EXAM: PORTABLE CHEST 1 VIEW COMPARISON:  08/14/2021 FINDINGS: The heart size and mediastinal contours are within normal limits. No focal consolidation or pleural effusion. The visualized skeletal structures are unremarkable. IMPRESSION: No active disease. Electronically Signed   By: Marlyce Sine M.D.   On: 02/16/2024 11:12   EKG: Independently reviewed.  Sinus rhythm at 83 bpm.  Nonspecific T wave changes.  Low voltage multiple leads.  Assessment/Plan Principal Problem:   Acute respiratory failure with hypoxia (HCC) Active Problems:   Benign hypertensive heart disease without heart failure   Hypothyroidism, postsurgical   Bilateral carotid artery disease (HCC)   GERD (gastroesophageal reflux disease)   Obesity, Class I, BMI 30-34.9   Chronic kidney disease, stage 3a (HCC)   Hyperlipidemia   Acute respiratory failure with hypoxia > Patient presenting  with several weeks of shortness of breath that has  worsened in the last week especially on exertion.  Has had some recent cough as well. > 1 evaluated by EMS today found to be 70% on room air and was placed on a nonrebreather and administered Solu-Medrol  and DuoNeb. > Workup in the ED has not identified specific etiology.   > Chest x-ray read as no acute normality but appear to have some increased interstitial markings.  Leukocytosis 16.7 possibly some degree of reactive leukocytosis due to Solu-Medrol  and respiratory issues. > BNP indeterminate at 173 and no clear evidence of volume overload on chest x-ray or exam. > CTA to evaluate for PE was considered in the ED but given patient's AKI we will start with D-dimer and pursue VQ scan if other source identified.  Will get CT scan without contrast to further delineate concern for pulmonary edema versus atypical infection versus other. > There was some concern outpatient that her symptoms could be related to her methotrexate per patient report.  Methotrexate pneumonitis is uncommon and would be lower on the differential. > Recent echocardiogram in February showed normal EF, indeterminate diastolic function normal RV function, will not repeat at this time. - Monitor in progressive unit overnight - Continue supplemental oxygen, wean as tolerated - Further evaluate with CT without contrast - Check procalcitonin and flu COVID and RSV to evaluate for possible viral etiology - Hold off on antibiotics unless CT is suspicious - Trend fever curve and WBC - D-dimer  AKI on CKD 3A Hypokalemia Elevated gap acidosis > Creatinine elevated 1.73 from unclear baseline but recent creatinine of 1.37 one-month ago. > Potassium noted to be 2.9 with anion gap of 17 and bicarb 17. > Does not appear grossly volume overloaded on exam.  Will await CT before administering significant IV fluids. > Received 40 mEq p.o. potassium in the ED. - Monitoring on telemetry - Check magnesium - Check lactic acid - IV fluids CT  shows no pulmonary edema - Trend renal function and electrolytes  Hypertension - Holding chlorthalidone  and telmisartan  in the setting of AKI - Continue home omeprazole  and metoprolol   Hyperlipidemia - Continue home rosuvastatin   Hypothyroidism - Continue home Synthroid   GERD - Continue Pepcid  Carotid artery disease Peripheral vascular disease - Continue home ASA, rosuvastatin   Obesity - Noted  Arthritis - Methotrexate held a week ago as above   DVT prophylaxis: Heparin Code Status:   Full  Family Communication:  Updated at bedside  Disposition Plan:   Patient is from:  Home  Anticipated DC to:  Home  Anticipated DC date:  1 to 3 days  Anticipated DC barriers: None  Consults called:  None Admission status:  Observation, progressive  Severity of Illness: The appropriate patient status for this patient is OBSERVATION. Observation status is judged to be reasonable and necessary in order to provide the required intensity of service to ensure the patient's safety. The patient's presenting symptoms, physical exam findings, and initial radiographic and laboratory data in the context of their medical condition is felt to place them at decreased risk for further clinical deterioration. Furthermore, it is anticipated that the patient will be medically stable for discharge from the hospital within 2 midnights of admission.    Johnetta Nab MD [facility]  How to contact the [facility] Attending or Consulting provider 7A - 7P or covering provider during after hours 7P -7A, for this patient?   Check the care team in [facility] and look for a)  attending/consulting [facility] provider listed and b) the [facility] team listed Log into [email or web address] and use [facility] universal password to access. If you do not have the password, please contact the hospital operator. Locate the [facility] provider you are looking for under [facility] and page to a number that you can be directly reached. If you  still have difficulty reaching the provider, please page the [facility] (Director on Call) for the Hospitalists listed on amion for assistance.  02/16/2024, 1:56 PM

## 2024-02-16 NOTE — ED Triage Notes (Signed)
 Patient BIB EMS from home c/o shortness of breath. EMS reports patient was 70% on RA. 98% on NRB. Patient given 125 solumedrol and duoneb en route. Patient does have a minorly productive cough and reports white sputum. Patient had one episode of vomiting yesterday. Patient initially had decreased sounds in the lower lobes with EMS, but after duoneb it changed to crackles. Patient just switched from ambulating with a cane to a rollator yesterday due to SOB while walking. Patient is not normally on oxygen at home.

## 2024-02-17 ENCOUNTER — Encounter: Admitting: Physical Medicine and Rehabilitation

## 2024-02-17 ENCOUNTER — Telehealth: Payer: Self-pay | Admitting: Physical Medicine and Rehabilitation

## 2024-02-17 ENCOUNTER — Observation Stay (HOSPITAL_COMMUNITY)

## 2024-02-17 DIAGNOSIS — Z7982 Long term (current) use of aspirin: Secondary | ICD-10-CM | POA: Diagnosis not present

## 2024-02-17 DIAGNOSIS — K219 Gastro-esophageal reflux disease without esophagitis: Secondary | ICD-10-CM | POA: Diagnosis present

## 2024-02-17 DIAGNOSIS — Z79890 Hormone replacement therapy (postmenopausal): Secondary | ICD-10-CM | POA: Diagnosis not present

## 2024-02-17 DIAGNOSIS — I739 Peripheral vascular disease, unspecified: Secondary | ICD-10-CM | POA: Diagnosis present

## 2024-02-17 DIAGNOSIS — Z87891 Personal history of nicotine dependence: Secondary | ICD-10-CM | POA: Diagnosis not present

## 2024-02-17 DIAGNOSIS — M069 Rheumatoid arthritis, unspecified: Secondary | ICD-10-CM | POA: Diagnosis present

## 2024-02-17 DIAGNOSIS — D631 Anemia in chronic kidney disease: Secondary | ICD-10-CM | POA: Diagnosis present

## 2024-02-17 DIAGNOSIS — J9601 Acute respiratory failure with hypoxia: Secondary | ICD-10-CM | POA: Diagnosis present

## 2024-02-17 DIAGNOSIS — E876 Hypokalemia: Secondary | ICD-10-CM | POA: Diagnosis present

## 2024-02-17 DIAGNOSIS — Z8349 Family history of other endocrine, nutritional and metabolic diseases: Secondary | ICD-10-CM | POA: Diagnosis not present

## 2024-02-17 DIAGNOSIS — N1831 Chronic kidney disease, stage 3a: Secondary | ICD-10-CM | POA: Diagnosis present

## 2024-02-17 DIAGNOSIS — J849 Interstitial pulmonary disease, unspecified: Secondary | ICD-10-CM

## 2024-02-17 DIAGNOSIS — R0602 Shortness of breath: Secondary | ICD-10-CM | POA: Diagnosis present

## 2024-02-17 DIAGNOSIS — D75839 Thrombocytosis, unspecified: Secondary | ICD-10-CM | POA: Diagnosis present

## 2024-02-17 DIAGNOSIS — Z6833 Body mass index (BMI) 33.0-33.9, adult: Secondary | ICD-10-CM | POA: Diagnosis not present

## 2024-02-17 DIAGNOSIS — E66811 Obesity, class 1: Secondary | ICD-10-CM | POA: Diagnosis present

## 2024-02-17 DIAGNOSIS — Z1152 Encounter for screening for COVID-19: Secondary | ICD-10-CM | POA: Diagnosis not present

## 2024-02-17 DIAGNOSIS — I1310 Hypertensive heart and chronic kidney disease without heart failure, with stage 1 through stage 4 chronic kidney disease, or unspecified chronic kidney disease: Secondary | ICD-10-CM | POA: Diagnosis present

## 2024-02-17 DIAGNOSIS — J704 Drug-induced interstitial lung disorders, unspecified: Secondary | ICD-10-CM | POA: Diagnosis present

## 2024-02-17 DIAGNOSIS — N179 Acute kidney failure, unspecified: Secondary | ICD-10-CM | POA: Diagnosis present

## 2024-02-17 DIAGNOSIS — K52831 Collagenous colitis: Secondary | ICD-10-CM | POA: Diagnosis present

## 2024-02-17 DIAGNOSIS — Z8249 Family history of ischemic heart disease and other diseases of the circulatory system: Secondary | ICD-10-CM | POA: Diagnosis not present

## 2024-02-17 DIAGNOSIS — T451X5A Adverse effect of antineoplastic and immunosuppressive drugs, initial encounter: Secondary | ICD-10-CM | POA: Diagnosis present

## 2024-02-17 DIAGNOSIS — Z823 Family history of stroke: Secondary | ICD-10-CM | POA: Diagnosis not present

## 2024-02-17 DIAGNOSIS — E890 Postprocedural hypothyroidism: Secondary | ICD-10-CM | POA: Diagnosis present

## 2024-02-17 DIAGNOSIS — E785 Hyperlipidemia, unspecified: Secondary | ICD-10-CM | POA: Diagnosis present

## 2024-02-17 LAB — COMPREHENSIVE METABOLIC PANEL WITH GFR
ALT: 32 U/L (ref 0–44)
AST: 34 U/L (ref 15–41)
Albumin: 2.6 g/dL — ABNORMAL LOW (ref 3.5–5.0)
Alkaline Phosphatase: 49 U/L (ref 38–126)
Anion gap: 11 (ref 5–15)
BUN: 44 mg/dL — ABNORMAL HIGH (ref 8–23)
CO2: 22 mmol/L (ref 22–32)
Calcium: 9.5 mg/dL (ref 8.9–10.3)
Chloride: 101 mmol/L (ref 98–111)
Creatinine, Ser: 2.01 mg/dL — ABNORMAL HIGH (ref 0.44–1.00)
GFR, Estimated: 24 mL/min — ABNORMAL LOW (ref >60)
Glucose, Bld: 149 mg/dL — ABNORMAL HIGH (ref 70–99)
Potassium: 3.9 mmol/L (ref 3.5–5.1)
Sodium: 134 mmol/L — ABNORMAL LOW (ref 135–145)
Total Bilirubin: 0.7 mg/dL (ref 0.0–1.2)
Total Protein: 5.8 g/dL — ABNORMAL LOW (ref 6.5–8.1)

## 2024-02-17 LAB — CBC
HCT: 28.1 % — ABNORMAL LOW (ref 36.0–46.0)
Hemoglobin: 9.2 g/dL — ABNORMAL LOW (ref 12.0–15.0)
MCH: 31.7 pg (ref 26.0–34.0)
MCHC: 32.7 g/dL (ref 30.0–36.0)
MCV: 96.9 fL (ref 80.0–100.0)
Platelets: 486 10*3/uL — ABNORMAL HIGH (ref 150–400)
RBC: 2.9 MIL/uL — ABNORMAL LOW (ref 3.87–5.11)
RDW: 17.3 % — ABNORMAL HIGH (ref 11.5–15.5)
WBC: 15.1 10*3/uL — ABNORMAL HIGH (ref 4.0–10.5)
nRBC: 0.1 % (ref 0.0–0.2)

## 2024-02-17 LAB — RESP PANEL BY RT-PCR (RSV, FLU A&B, COVID)  RVPGX2
Influenza A by PCR: NEGATIVE
Influenza B by PCR: NEGATIVE
Resp Syncytial Virus by PCR: NEGATIVE
SARS Coronavirus 2 by RT PCR: NEGATIVE

## 2024-02-17 LAB — SODIUM, URINE, RANDOM: Sodium, Ur: 14 mmol/L

## 2024-02-17 LAB — OSMOLALITY, URINE: Osmolality, Ur: 268 mosm/kg — ABNORMAL LOW (ref 300–900)

## 2024-02-17 MED ORDER — METHYLPREDNISOLONE SODIUM SUCC 40 MG IJ SOLR
40.0000 mg | Freq: Every day | INTRAMUSCULAR | Status: DC
  Administered 2024-02-17 – 2024-02-19 (×3): 40 mg via INTRAVENOUS
  Filled 2024-02-17 (×3): qty 1

## 2024-02-17 MED ORDER — ACETAMINOPHEN 500 MG PO TABS
1000.0000 mg | ORAL_TABLET | Freq: Two times a day (BID) | ORAL | Status: DC
  Administered 2024-02-17 – 2024-02-19 (×5): 1000 mg via ORAL
  Filled 2024-02-17 (×5): qty 2

## 2024-02-17 MED ORDER — TECHNETIUM TO 99M ALBUMIN AGGREGATED
4.2000 | Freq: Once | INTRAVENOUS | Status: AC | PRN
  Administered 2024-02-17: 4.2 via INTRAVENOUS

## 2024-02-17 NOTE — Consult Note (Signed)
 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  She was noted to be hypoxic to 70% on room air.  Initial labs significant for leukocytosis 16.7, slight high procalcitonin 0.17, hemoglobin 10.3, D-dimer 2.4. COVID flu RSV was negative. CT chest without contrast showed patchy groundglass opacity bilateral lungs with subpleural reticulation mostly in the mid and lower lung fields On questioning, she reports a chronic cough for 4 years which has been attributed to allergies.  She recently saw cardiology and echo showed normal LV function with increased mitral valve calcification.  She was taken off Norvasc  and verapamil  dose was increased. She reports a recent diagnosis of rheumatoid arthritis She developed arthritis in her wrist and hands, saw Dr. Meredith Stalls was not able to tolerate hydroxychloroquine or leflunomide and was started on methotrexate subcu grams every week since she was not able to tolerate oral.  She stopped this about a week ago due to symptoms of shortness of breath, lightheadedness and weight loss. She also reports a fall few months ago fracture admits to being sedentary.  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 rate 20, height 5' (1.524 m), weight 77.7 kg, last menstrual period 09/24/1979, SpO2 95%.        Intake/Output Summary (Last 24 hours) at 02/17/2024 1417 Last data filed at 02/17/2024 1340 Gross per 24 hour  Intake 1429 ml  Output 525 ml  Net 904 ml   Filed Weights   02/16/24 1033 02/17/24 0454  Weight: 82.8 kg 77.7 kg    Examination: Gen. Pleasant, obese, in no distress, normal affect ENT - no pallor,icterus, no post nasal drip Neck: No JVD, no thyromegaly, no carotid bruits Lungs: no use of accessory muscles, no dullness to percussion, bibasal dry crackles, no rhonchi Cardiovascular: Rhythm regular, heart sounds  normal, no murmurs or gallops, no peripheral edema Abdomen: soft and non-tender, no hepatosplenomegaly, BS normal. Musculoskeletal: No deformities, no cyanosis or clubbing Neuro:  alert, non focal, no tremors   Labs show improved hyponatremia, persistent leukocytosis 15K, BUN/creatinine 40/2.0  Resolved problem list   Assessment and Plan   Acute hypoxic respiratory failure Interstitial lung disease  -This could be related to methotrexate (onset correlates with starting this medication] however it could be related to rheumatoid arthritis itself -chronic cough for 4 years and previous chest x-ray showing some coarse interstitium makes this possible.  Lastly this could be IPF but would not rush to give this diagnosis in the presence of active RA Leukocytosis raises the question of acute viral illness although she does not describe typical URI symptoms. Pulmonary embolism seems less likely but we are obligated to rule this out given her sedentary lifestyle last few weeks and a  positive D-dimer  -Recommend starting Solu-Medrol  40 daily and see if hypoxia improves over the next 2 days.  We could then switch her to oral prednisone  and discharge her with follow-up in pulmonary  clinic/rheumatology - She will need high-resolution CT chest at some point - Remains to be seen if she will come off oxygen  Best Practice (right click and "Reselect all SmartList Selections" daily)    Code Status:  full code Last date of multidisciplinary goals of care discussion [NA]  Labs   CBC: Recent Labs  Lab 02/16/24 1038 02/17/24 0248  WBC 16.7* 15.1*  NEUTROABS 12.2*  --   HGB 10.2* 9.2*  HCT 30.7* 28.1*  MCV 96.5 96.9  PLT 554* 486*    Basic Metabolic Panel: Recent Labs  Lab 02/16/24 1038 02/16/24 1043 02/17/24 0248  NA 132*  --  134*  K 2.9*  --  3.9  CL 98  --  101  CO2 17*  --  22  GLUCOSE 114*  --  149*  BUN 31*  --  44*  CREATININE 1.73*  --  2.01*  CALCIUM  9.3  --  9.5  MG  --  1.7  --    GFR: Estimated Creatinine Clearance: 19.2 mL/min (A) (by C-G formula based on SCr of 2.01 mg/dL (H)). Recent Labs  Lab 02/16/24 1038 02/16/24 1043 02/17/24 0248  PROCALCITON  --  0.17  --   WBC 16.7*  --  15.1*    Liver Function Tests: Recent Labs  Lab 02/16/24 1038 02/17/24 0248  AST 37 34  ALT 32 32  ALKPHOS 51 49  BILITOT 0.9 0.7  PROT 6.3* 5.8*  ALBUMIN 2.9* 2.6*   No results for input(s): "LIPASE", "AMYLASE" in the last 168 hours. No results for input(s): "AMMONIA" in the last 168 hours.  ABG No results found for: "PHART", "PCO2ART", "PO2ART", "HCO3", "TCO2", "ACIDBASEDEF", "O2SAT"   Coagulation Profile: No results for input(s): "INR", "PROTIME" in the last 168 hours.  Cardiac Enzymes: No results for input(s): "CKTOTAL", "CKMB", "CKMBINDEX", "TROPONINI" in the last 168 hours.  HbA1C: Hgb A1c MFr Bld  Date/Time Value Ref Range Status  10/18/2019 09:52 AM 5.7 (H) 4.8 - 5.6 % Final    Comment:             Prediabetes: 5.7 - 6.4          Diabetes: >6.4          Glycemic control for adults with diabetes: <7.0   02/16/2019 10:43 AM 5.6 4.8 - 5.6 % Final    Comment:             Prediabetes: 5.7 - 6.4          Diabetes: >6.4           Glycemic control for adults with diabetes: <7.0     CBG: No results for input(s): "GLUCAP" in the last 168 hours.  Review of Systems:   Constitutional: negative for anorexia, fevers and sweats  Eyes: negative for irritation, redness and visual disturbance  Ears, nose, mouth, throat, and face: negative for earaches, epistaxis, nasal congestion and sore throat  Respiratory: negative for  sputum and wheezing  Cardiovascular: negative for chest pain, lower extremity edema, orthopnea, palpitations and syncope  Gastrointestinal: negative for abdominal pain, constipation, diarrhea, melena, nausea and vomiting  Genitourinary:negative for dysuria, frequency and hematuria  Hematologic/lymphatic: negative for bleeding, easy bruising and lymphadenopathy  Musculoskeletal:negative for arthralgias, muscle weakness and stiff joints  Neurological: negative for coordination  problems, gait problems, headaches and weakness  Endocrine: negative for diabetic symptoms including polydipsia, polyuria and weight loss   Past Medical History:  She,  has a past medical history of Arthritis, Back pain, Carotid artery occlusion, Carotid bruit, Colitis, collagenous, Diverticulosis, GERD (gastroesophageal reflux disease), Hyperlipidemia, Hypertension, Injury of left leg (06/05/12), Peripheral vascular disease (HCC), Shingles outbreak (2017), and Thyroid  disease (02-27-12).   Surgical History:   Past Surgical History:  Procedure Laterality Date   ABDOMINAL HYSTERECTOMY  01/1980   CAROTID ENDARTERECTOMY  1998   Stable since surgery -slight build up returning-follows with yrly Dopplers   CATARACT EXTRACTION, BILATERAL  2004 or 2005 per pt   COLONOSCOPY     EYE SURGERY  02-27-12   Bil. Cataract surgery   THYROID  LOBECTOMY  03/02/2012   Procedure: THYROID  LOBECTOMY;  Surgeon: Keitha Pata, MD;  Location: [facility];  Service: General;  Laterality: Left;     Social History:   reports that she quit smoking about 48 years  ago. Her smoking use included cigarettes. She has never used smokeless tobacco. She reports current alcohol use of about 1.0 standard drink of alcohol per week. She reports that she does not use drugs.   Family History:  Her family history includes Arthritis in her mother; Atrial fibrillation in her mother; Heart disease in her brother and mother; Hyperlipidemia in her mother; Hypertension in her mother; Stroke in her mother. There is no history of Heart attack or Breast cancer.   Allergies Allergies  Allergen Reactions   Zebeta Other (See Comments)    Does not work, per patient.   Ace Inhibitors Other (See Comments)    cough   Hydroxychloroquine Sulfate Rash     Home Medications  Prior to Admission medications   Medication Sig Start Date End Date Taking? Authorizing Provider  acetaminophen  (TYLENOL ) 500 MG tablet Take 1,000 mg by mouth 2 (two) times daily.   Yes [provider]  aspirin  81 MG EC tablet Take 81 mg by mouth daily.   Yes [provider]  B Complex Vitamins (VITAMIN-B COMPLEX PO) Take 1 tablet by mouth daily with breakfast.   Yes [provider]  calcium  carbonate (TUMS - DOSED IN MG ELEMENTAL CALCIUM ) 500 MG chewable tablet Chew 1 tablet by mouth daily as needed for indigestion or heartburn.   Yes [provider]  chlorthalidone  (HYGROTON ) 25 MG tablet Take 1 tablet (25 mg total) by mouth daily. 12/15/23  Yes Chandrasekhar, Mahesh A, MD  Cholecalciferol (VITAMIN D3) 50 MCG (2000 UT) CAPS Take 2,000 Units by mouth 3 (three) times a week. Tuesdays, Thursdays and Saturdays   Yes [provider]  dimenhyDRINATE (DRAMAMINE) 50 MG tablet Take 50 mg by mouth every 8 (eight) hours as needed for dizziness or nausea.   Yes [provider]  famotidine  (PEPCID ) 20 MG tablet Take 1 tablet by mouth as needed.   Yes [provider]  folic acid (FOLVITE) 1 MG tablet Take 1 mg by mouth daily. 09/09/23  Yes [provider]   GEMTESA  75 MG TABS TAKE 1 TABLET BY MOUTH DAILY 08/08/23  Yes Lillian Rein, MD  KRILL OIL PO Take 1 tablet by mouth daily.   Yes [provider]  levothyroxine  (SYNTHROID ) 112 MCG tablet Take 1 tablet (112 mcg total) by mouth daily. 10/03/20  Yes Abonza, Maritza, PA-C  loratadine (CLARITIN) 10 MG tablet Take 10 mg by mouth daily as needed for allergies.   Yes [provider]  metoprolol  succinate (TOPROL -XL) 100 MG 24 hr tablet TAKE 1 TABLET BY MOUTH DAILY 08/06/23  Yes Chandrasekhar, Mahesh A, MD  Multiple Vitamin (MULTIVITAMIN) tablet Take 1 tablet by mouth daily.     Yes [provider]  polyethylene glycol (MIRALAX / GLYCOLAX) 17 g packet Take 17 g by mouth every evening.   Yes [provider]  rosuvastatin  (CRESTOR ) 20 MG tablet TAKE 1 TABLET BY MOUTH DAILY 07/10/23  Yes Lucendia Rusk, MD  telmisartan  (MICARDIS ) 80 MG tablet Take 1 tablet (80 mg total) by mouth daily. 10/21/23  Yes Chandrasekhar, Mahesh A, MD  verapamil  (CALAN -SR) 240 MG CR tablet Take 1.5 tablets (360 mg total) by mouth daily with breakfast. 12/30/23  Yes Chandrasekhar, Mahesh A, MD  estradiol  (ESTRACE ) 0.1 MG/GM vaginal cream Apply to skin externally nightly for the next 7 days and then decrease to using twice weekly. Patient not taking: Reported on 02/16/2024 07/31/22   Lillian Rein, MD  furosemide  (LASIX ) 40 MG tablet Take 1 tablet (40 mg total) by mouth daily as needed for edema. Patient not taking: Reported on 02/16/2024 08/13/21   Lucendia Rusk, MD  Methotrexate Sodium (METHOTREXATE, PF,) 50 MG/2ML injection 0.8 mLs once a week. Patient not taking: Reported on 02/16/2024 02/05/24   [provider]      Celene Coins MD. Hazle Lites.  Pulmonary & Critical care Pager : [phone]  If no response to pager , please call [phone] until 7 pm After 7:00 pm call Elink  [phone]   02/17/2024

## 2024-02-17 NOTE — Hospital Course (Addendum)
 Brief Narrative/Hospital Course: 83 y.o. female with medical history significant of hypertension, hyperlipidemia, hypothyroidism, GERD, CKD 3A, carotid artery disease, peripheral vascular disease, obesity, IBS, arthritis presenting with worsening shortness of breath  of several weeks. She stopped her methotrexate a week ago as there was some thought this could be causing her symptoms.  Does report a recent productive cough. The ED patient is brought for EMS hypoxic 70%, workup with hyponatremia hypokalemia 2.9 creatinine 1.7 Elamin 2.9 BNP 173 procalcitonin 0.17 leukocytosis 16.7 thrombocytosis with anemia 10.2, D-dimer elevated 2.4. COVID RSV influenza negative Chest x-ray no active disease, CT chest > Patchy ground-glass opacity in the lungs bilaterally with subpleural reticulation most prominent in the mid and lower lungs-compatible with interstitial lung disease. Superimposed component of acute infectious/inflammatory etiology suspected.2-3 mm  LUL pulmonary nodule-no follow-up needed if patient is low-risk. 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 Workup so far unremarkable-BNP 173, no fluid overload no obvious pneumonia.Viral workup negative Pro-Cal is ok 0.17 + D DIMER- BUT VQ scan was negative for PE. Unable to obtain CTA due to AKI Last echo ef normal in feb. Off hydrochlorothiazide  to chlorthalidone  and off amlodipine . Suspect could be from methotrexate related given onset correlating with  starting oral medication but could be due to rheumatoid arthritis related and ILD per CT chest. Patient pulmonary input, continue steroid taper , check home o2 need and plan for dc home  Discussed with pulmonary>Switch to oral prednisone  40 mg for 2 days, then 30 mg for 2 days, then 20 mg until seen in follow-up in pulmonary clinic/rheumatology .  Patient did well on room air however on ambulation needed 2 L oxygen and oxygen will be set up Will need high-resolution CT at some point  Thrombocytosis: monitor  Normocytic anemia: Hb stable, monitor  AKI on CKD 3A Hypokalemia-resolved Elevated gap acidosis: Creatinine around 1.5-1.7 now peak 2.0. con to encourage oral hydration.Bicarb potassium stable. BUN up likely from steroid Advise outpatient BMP check with PCP in 1 week Recent Labs    12/29/23 1111 02/16/24 1038 02/17/24 0248 02/18/24 0234 02/19/24 0245  BUN 35* 31* 44* 45* 50*  CREATININE 1.37* 1.73* 2.01* 1.50* 1.78*  CO2 21 17* 22 22 23   K 4.5 2.9* 3.9 4.0 3.9     Hypertension: Stable on home metoprolol , verapamil .Holding chlorthalidone  and telmisartan  in the setting of AKI.   Hyperlipidemia Continue home rosuvastatin    Hypothyroidism Continue home Synthroid    GERD Continue Pepcid.   Carotid artery disease Peripheral vascular disease Continue home ASA, rosuvastatin    Arthritis Her Methotrexate held a week ago as above .  Followed by rheumatologist.  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  Class I Obesity w/ Body mass index is 33.12 kg/m.: Will benefit with PCP follow-up, weight loss,healthy lifestyle and outpatient sleep eval if not done.

## 2024-02-17 NOTE — Progress Notes (Signed)
 Mobility Specialist Progress Note:   02/17/24 1001  Mobility  Activity Ambulated with assistance in hallway  Level of Assistance Standby assist, set-up cues, supervision of patient - no hands on  Assistive Device Four wheel walker  Distance Ambulated (ft) 175 ft  Activity Response Tolerated well  Mobility Referral Yes  Mobility visit 1 Mobility  Mobility Specialist Start Time (ACUTE ONLY) 1001  Mobility Specialist Stop Time (ACUTE ONLY) 1017  Mobility Specialist Time Calculation (min) (ACUTE ONLY) 16 min   Pt agreeable to mobility session. Required no physical assistance throughout ambulation. Pt very limited by SpO2. Desat to 78% at one point during ambulation on 4LO2, however recovered with standing rest and PLB to 91%. Pt back in bed with all needs met, daughter in room. Left on 3LO2, SpO2 94%.  Oneda Big Mobility Specialist Please contact via SecureChat or  Rehab office at [phone]

## 2024-02-17 NOTE — Telephone Encounter (Signed)
 Patient is in the hospital. Cancel appointment for today.

## 2024-02-17 NOTE — TOC CM/SW Note (Signed)
 Transition of Care [facility]) - Inpatient Brief Assessment   Patient Details  Name: [name] MRN: [id number] Date of Birth: [birth year]  Transition of Care [facility]) CM/SW Contact:    Jennett Model, RN Phone Number: 02/17/2024, 4:36 PM   Clinical Narrative: From home alone, has PCP and insurance on file, states has no HH services in place at this time, has cane, walker, rollator at home.  States family member will transport them home at [employer] and family is support system, states gets medications from [employer].  Pta self ambulatory with cane/walker.  Patient  gives this NCM permission to speak with daughter, [name] and her other daughter.    Transition of Care Asessment: Insurance and Status: Insurance coverage has been reviewed Patient has primary care physician: Yes Home environment has been reviewed: home alone Prior level of function:: indep Prior/Current Home Services: Current home services (cane, walker, rollator) Social Drivers of Health Review: SDOH reviewed no interventions necessary Readmission risk has been reviewed: Yes Transition of care needs: no transition of care needs at this time

## 2024-02-17 NOTE — Plan of Care (Signed)
 Patient remains short of breath with minimal activity and o2 sat decreases into the 80's with 02 at  4l.

## 2024-02-17 NOTE — Progress Notes (Signed)
 Patient is requesting to resume home Tylenol  1000 mg twice daily as she takes at home for management of back pain.  Normal hepatic function panel.  Reordered it.

## 2024-02-17 NOTE — Progress Notes (Signed)
 PROGRESS NOTE [name]  ZOX:[id number] DOB: [birth year] DOA: 02/16/2024 PCP: Azalia Leo, MD  Brief Narrative/Hospital Course: Brief Narrative/Hospital Course: 83 y.o. female with medical history significant of hypertension, hyperlipidemia, hypothyroidism, GERD, CKD 3A, carotid artery disease, peripheral vascular disease, obesity, IBS, arthritis presenting with worsening shortness of breath  of several weeks. She stopped her methotrexate a week ago as there was some thought this could be causing her symptoms.  Does report a recent productive cough. The ED patient is brought for EMS hypoxic 70%, workup with hyponatremia hypokalemia 2.9 creatinine 1.7 Elamin 2.9 BNP 173 procalcitonin 0.17 leukocytosis 16.7 thrombocytosis with anemia 10.2, D-dimer elevated 2.4. COVID RSV influenza negative Chest x-ray no active disease, CT chest > Patchy ground-glass opacity in the lungs bilaterally with subpleural reticulation most prominent in the mid and lower lungs-compatible with interstitial lung disease. Superimposed component of acute infectious/inflammatory etiology suspected. 2-3 mm  LUL pulmonary nodule-no follow-up needed if patient is low-risk. Patient was admitted for acute hypoxic respiratory failure.  Subjective: Seen and examined today Feels better Was not short of breath on walking eventhough needed oxygen Overnight afebrile on nasal cannula 4 L initially on 15 L/nonrebreather  Assessment and plan:  Acute respiratory failure with hypoxia CT finding concerning for GGO bilaterally-ILD DVT D-dimer: For EMS found to be 70% on room air and was placed on a nonrebreather and administered Solu-Medrol  and DuoNeb. BNP stable 173 no fluid overload no obvious pneumonia.  Viral workup negative Pro-Cal is ok 0.17 No obvious cause except for possible ILD and d dimer+. unable to obtain CT due to renal failure. Obtain VQ scan. Continue supplemental oxygen. Last echo ef normal in feb. Off  hydrochlorothiazide  to chlorthalidone  and off amlodipine . Will get PCCM to see.  Thrombocytosis: monitor  Normocytic anemia: Hb stable, monitor  AKI on CKD 3A Hypokalemia-resolved Elevated gap acidosis: Creatinine trending up, holding diuretics obtain urine electrolytes. Consider ivf if po intake is adequate or if creatinine trends up further Recent Labs    12/29/23 1111 02/16/24 1038 02/17/24 0248  BUN 35* 31* 44*  CREATININE 1.37* 1.73* 2.01*  CO2 21 17* 22  K 4.5 2.9* 3.9     Hypertension: Holding chlorthalidone  and telmisartan  in the setting of AKI. MONITOR   Hyperlipidemia Continue home rosuvastatin    Hypothyroidism Continue home Synthroid    GERD Continue Pepcid.   Carotid artery disease Peripheral vascular disease Continue home ASA, rosuvastatin    Arthritis Her Methotrexate held a week ago as above   Chronic diarrhea: Patient reports increased diarrhea from collagenous colitis.  Class I Obesity w/ Body mass index is 33.45 kg/m.: Will benefit with PCP follow-up, weight loss,healthy lifestyle and outpatient sleep eval if not done.   DVT prophylaxis: heparin injection 5,000 Units Start: 02/16/24 1400 Code Status:   Code Status: Full Code Family Communication: plan of care discussed with patient at bedside. Patient status is: Remains hospitalized because of severity of illness Level of care: Progressive   Dispo: The patient is from: Home            Anticipated disposition: TBD Objective: Vitals last 24 hrs: Vitals:   02/17/24 0029 02/17/24 0454 02/17/24 0736 02/17/24 1110  BP: (!) 121/47 (!) 132/50 (!) 146/49 (!) 131/49  Pulse: 76 77 86 77  Resp: 20 20 20 20   Temp: 97.9 F (36.6 C) 97.8 F (36.6 C) 97.8 F (36.6 C) 97.9 F (36.6 C)  TempSrc: Oral Oral Oral Oral  SpO2: 95% 96% 92% 95%  Weight:  77.7 kg  Height:       Physical Examination: General exam: alert awake, older than stated age HEENT:Oral mucosa moist, Ear/Nose WNL  grossly Respiratory system: Bilaterally clear BS, no use of accessory muscle Cardiovascular system: S1 & S2 +. Gastrointestinal system: Abdomen soft,  NT,ND,BS+ Nervous System: Alert, awake, following commands. Extremities: LE edema neg, warm extremities Skin: No rashes,warm. MSK: Normal muscle bulk/tone.   Data Reviewed: I have personally reviewed following labs and imaging studies ( see epic result tab) CBC: Recent Labs  Lab 02/16/24 1038 02/17/24 0248  WBC 16.7* 15.1*  NEUTROABS 12.2*  --   HGB 10.2* 9.2*  HCT 30.7* 28.1*  MCV 96.5 96.9  PLT 554* 486*   CMP: Recent Labs  Lab 02/16/24 1038 02/16/24 1043 02/17/24 0248  NA 132*  --  134*  K 2.9*  --  3.9  CL 98  --  101  CO2 17*  --  22  GLUCOSE 114*  --  149*  BUN 31*  --  44*  CREATININE 1.73*  --  2.01*  CALCIUM  9.3  --  9.5  MG  --  1.7  --    GFR: Estimated Creatinine Clearance: 19.2 mL/min (A) (by C-G formula based on SCr of 2.01 mg/dL (H)). Recent Labs  Lab 02/16/24 1038 02/17/24 0248  AST 37 34  ALT 32 32  ALKPHOS 51 49  BILITOT 0.9 0.7  PROT 6.3* 5.8*  ALBUMIN 2.9* 2.6*   No results for input(s): "LIPASE", "AMYLASE" in the last 168 hours. No results for input(s): "AMMONIA" in the last 168 hours. Coagulation Profile: No results for input(s): "INR", "PROTIME" in the last 168 hours. Unresulted Labs (From admission, onward)     Start     Ordered   02/18/24 0500  Basic metabolic panel with GFR  Tomorrow morning,   R        02/17/24 0948   02/18/24 0500  CBC  Tomorrow morning,   R        02/17/24 0948   02/17/24 0949  Sodium, urine, random  Once,   R        02/17/24 0948   02/17/24 0949  Osmolality, urine  Once,   R        02/17/24 0948   02/17/24 0942  C Difficile Quick Screen w PCR reflex  (C Difficile quick screen w PCR reflex panel )  Once, for 24 hours,   TIMED       References:    CDiff Information Tool   02/17/24 0941          Antimicrobials/Microbiology: Anti-infectives (From admission,  onward)    None     No results found for: "SDES", "SPECREQUEST", "CULT", "REPTSTATUS"  Procedures: Medications reviewed:  Scheduled Meds:  acetaminophen   1,000 mg Oral BID   aspirin EC  81 mg Oral Daily   heparin  5,000 Units Subcutaneous Q8H   levothyroxine   112 mcg Oral Daily   metoprolol  succinate  100 mg Oral Daily   mirabegron  ER  25 mg Oral Daily   rosuvastatin   20 mg Oral Daily   sodium chloride  flush  3 mL Intravenous Q12H   verapamil   360 mg Oral Q breakfast  Continuous Infusions: Lesa Rape, MD [facility] 02/17/2024, 1:29 PM

## 2024-02-17 NOTE — Progress Notes (Signed)
 Patient to nuc med for vq scan.

## 2024-02-18 DIAGNOSIS — J984 Other disorders of lung: Secondary | ICD-10-CM

## 2024-02-18 DIAGNOSIS — J9601 Acute respiratory failure with hypoxia: Secondary | ICD-10-CM | POA: Diagnosis not present

## 2024-02-18 LAB — BASIC METABOLIC PANEL WITH GFR
Anion gap: 9 (ref 5–15)
BUN: 45 mg/dL — ABNORMAL HIGH (ref 8–23)
CO2: 22 mmol/L (ref 22–32)
Calcium: 9.3 mg/dL (ref 8.9–10.3)
Chloride: 103 mmol/L (ref 98–111)
Creatinine, Ser: 1.5 mg/dL — ABNORMAL HIGH (ref 0.44–1.00)
GFR, Estimated: 34 mL/min — ABNORMAL LOW (ref >60)
Glucose, Bld: 135 mg/dL — ABNORMAL HIGH (ref 70–99)
Potassium: 4 mmol/L (ref 3.5–5.1)
Sodium: 134 mmol/L — ABNORMAL LOW (ref 135–145)

## 2024-02-18 LAB — CBC
HCT: 27.4 % — ABNORMAL LOW (ref 36.0–46.0)
Hemoglobin: 9 g/dL — ABNORMAL LOW (ref 12.0–15.0)
MCH: 32 pg (ref 26.0–34.0)
MCHC: 32.8 g/dL (ref 30.0–36.0)
MCV: 97.5 fL (ref 80.0–100.0)
Platelets: 605 10*3/uL — ABNORMAL HIGH (ref 150–400)
RBC: 2.81 MIL/uL — ABNORMAL LOW (ref 3.87–5.11)
RDW: 17.3 % — ABNORMAL HIGH (ref 11.5–15.5)
WBC: 19.1 10*3/uL — ABNORMAL HIGH (ref 4.0–10.5)
nRBC: 0.1 % (ref 0.0–0.2)

## 2024-02-18 NOTE — Progress Notes (Signed)
 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  She was noted to be hypoxic to 70% on room air.  Initial labs significant for leukocytosis 16.7, slight high procalcitonin 0.17, hemoglobin 10.3, D-dimer 2.4. COVID flu RSV was negative. CT chest without contrast showed patchy groundglass opacity bilateral lungs with subpleural reticulation mostly in the mid and lower lung fields On questioning, she reports a chronic cough for 4 years which has been attributed to allergies.  She recently saw cardiology and echo showed normal LV function with increased mitral valve calcification.  She was taken off Norvasc  and verapamil  dose was increased. She reports a recent diagnosis of rheumatoid arthritis She developed arthritis in her wrist and hands, saw Dr. Meredith Stalls was not able to tolerate hydroxychloroquine or leflunomide and was started on methotrexate subcu grams every week since she was not able to tolerate oral.  She stopped this about a week ago due to symptoms of shortness of breath, lightheadedness and weight loss. She also reports a fall few months ago fracture admits to being sedentary.  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 rate 18, height 5' (1.524 m), weight 76.9 kg, last menstrual period 09/24/1979, SpO2 96%.        Intake/Output Summary (Last 24 hours) at 02/18/2024 1327 Last data filed at 02/18/2024 0900 Gross per 24 hour  Intake 440 ml  Output 875 ml  Net -435 ml   Filed Weights   02/16/24 1033 02/17/24 0454 02/18/24 0452  Weight: 82.8 kg 77.7 kg 76.9 kg    Examination: Gen. Pleasant, obese, in no distress, normal affect ENT - no pallor,icterus, no post nasal drip Neck: No JVD, no thyromegaly, no carotid bruits Lungs: no use of accessory muscles, no dullness to percussion, right base dry crackles, no rhonchi Cardiovascular: Rhythm regular, heart sounds  normal, no murmurs or gallops, no peripheral edema Abdomen: soft and non-tender, no hepatosplenomegaly, BS normal. Musculoskeletal: No deformities, no cyanosis or clubbing Neuro:  alert, non focal, no tremors   Labs show stable mild hyponatremia, persistent leukocytosis 19K, improved BUN/creatinine 45/1.5  Resolved problem list   Assessment and Plan   Acute hypoxic respiratory failure Interstitial lung disease  -This could be related to methotrexate (onset correlates with starting this medication] however it could be related to rheumatoid arthritis itself -chronic cough for 4 years and previous chest x-ray showing some coarse interstitium makes this possible.  Lastly this could be IPF but would not rush to give this diagnosis in the presence of active RA Leukocytosis raises the question of acute viral illness although she does not describe typical  URI symptoms. Pulmonary embolism seems less likely but we are obligated to rule this out given her sedentary lifestyle last few weeks and a positive D-dimer Marginal improvement with steroids again points towards methotrexate  pneumonitis  - Continue Solu-Medrol  40 x 1 more day  and see if hypoxia improves  We could then switch her to oral prednisone  and discharge her with follow-up in pulmonary clinic/rheumatology - She will need high-resolution CT chest at some point - Remains to be seen if she will come off oxygen  Best Practice (right click and "Reselect all SmartList Selections" daily)    Code Status:  full code Last date of multidisciplinary goals of care discussion [NA]  Labs   CBC: Recent Labs  Lab 02/16/24 1038 02/17/24 0248 02/18/24 0234  WBC 16.7* 15.1* 19.1*  NEUTROABS 12.2*  --   --   HGB 10.2* 9.2* 9.0*  HCT 30.7* 28.1* 27.4*  MCV 96.5 96.9 97.5  PLT 554* 486* 605*    Basic Metabolic Panel: Recent Labs  Lab 02/16/24 1038 02/16/24 1043 02/17/24 0248 02/18/24 0234  NA 132*  --  134* 134*  K 2.9*  --  3.9 4.0  CL 98  --  101 103  CO2 17*  --  22 22  GLUCOSE 114*  --  149* 135*  BUN 31*  --  44* 45*  CREATININE 1.73*  --  2.01* 1.50*  CALCIUM  9.3  --  9.5 9.3  MG  --  1.7  --   --    GFR: Estimated Creatinine Clearance: 25.6 mL/min (A) (by C-G formula based on SCr of 1.5 mg/dL (H)). Recent Labs  Lab 02/16/24 1038 02/16/24 1043 02/17/24 0248 02/18/24 0234  PROCALCITON  --  0.17  --   --   WBC 16.7*  --  15.1* 19.1*    Liver Function Tests: Recent Labs  Lab 02/16/24 1038 02/17/24 0248  AST 37 34  ALT 32 32  ALKPHOS 51 49  BILITOT 0.9 0.7  PROT 6.3* 5.8*  ALBUMIN  2.9* 2.6*   No results for input(s): "LIPASE", "AMYLASE" in the last 168 hours. No results for input(s): "AMMONIA" in the last 168 hours.  ABG No results found for: "PHART", "PCO2ART", "PO2ART", "HCO3", "TCO2", "ACIDBASEDEF", "O2SAT"   Coagulation Profile: No results for input(s): "INR", "PROTIME" in the last 168 hours.  Cardiac Enzymes: No results for input(s): "CKTOTAL", "CKMB", "CKMBINDEX", "TROPONINI" in the last 168 hours.  HbA1C: Hgb A1c MFr Bld  Date/Time Value Ref Range Status   10/18/2019 09:52 AM 5.7 (H) 4.8 - 5.6 % Final    Comment:             Prediabetes: 5.7 - 6.4          Diabetes: >6.4          Glycemic control for adults with diabetes: <7.0   02/16/2019 10:43 AM 5.6 4.8 - 5.6 % Final    Comment:             Prediabetes: 5.7 - 6.4          Diabetes: >6.4          Glycemic control for adults with diabetes: <7.0     CBG: No results for input(s): "GLUCAP" in the last 168 hours.   Celene Coins MD. Hazle Lites. [facility] Pager : [phone]  If no response to pager , please call [phone] until 7 pm After 7:00 pm call Elink  [phone]   02/18/2024

## 2024-02-18 NOTE — Plan of Care (Signed)
 O2 down to 2l [place] today. Patient still has desat when up and active but 02 sat increases quickly after patient takes a rest.

## 2024-02-18 NOTE — Progress Notes (Signed)
 PROGRESS NOTE [name]  ZOX:[id number] DOB: 17-Apr-1941 DOA: 02/16/2024 PCP: Azalia Leo, MD  Brief Narrative/Hospital Course: Brief Narrative/Hospital Course: 83 y.o. female with medical history significant of hypertension, hyperlipidemia, hypothyroidism, GERD, CKD 3A, carotid artery disease, peripheral vascular disease, obesity, IBS, arthritis presenting with worsening shortness of breath  of several weeks. She stopped her methotrexate a week ago as there was some thought this could be causing her symptoms.  Does report a recent productive cough. The ED patient is brought for EMS hypoxic 70%, workup with hyponatremia hypokalemia 2.9 creatinine 1.7 Elamin 2.9 BNP 173 procalcitonin 0.17 leukocytosis 16.7 thrombocytosis with anemia 10.2, D-dimer elevated 2.4. COVID RSV influenza negative Chest x-ray no active disease, CT chest > Patchy ground-glass opacity in the lungs bilaterally with subpleural reticulation most prominent in the mid and lower lungs-compatible with interstitial lung disease. Superimposed component of acute infectious/inflammatory etiology suspected. 2-3 mm  LUL pulmonary nodule-no follow-up needed if patient is low-risk. Patient was admitted for acute hypoxic respiratory failure.  Subjective: Seen and examined Alert awake feels much better, slept very well last night Overall afebrile BP stable on 3 L nasal cannula Labs shows creatinine improving to 1.5 from 2, leukocytosis 19K up from 15 likely from steroid  Assessment and plan:  Acute respiratory failure with hypoxia CT finding concerning for GGO bilaterally-ILD DVT D-dimer: For EMS found to be 70% on room air and was placed on a nonrebreather and administered, continue supplemental oxygen and DuoNeb. BNP stable 173 no fluid overload no obvious pneumonia.Viral workup negative Pro-Cal is ok 0.17 No obvious cause except for possible ILD and d dimer+>VQ scan was negative for PE.  Unable to obtain CTA due to AKI Last  echo ef normal in feb. Off hydrochlorothiazide  to chlorthalidone  and off amlodipine . Appreciate PCCM input suspect could be from methotrexate related given onset correlating with starting oral medication but could be due to rheumatoid arthritis related and ILD. Started on IV Solu-Medrol  40 daily, and seems to be responding, Wean oxygen as able.   Will need high-resolution CT at some point Appreciate pulmonary input  Thrombocytosis: monitor  Normocytic anemia: Hb stable, monitor  AKI on CKD 3A Hypokalemia-resolved Elevated gap acidosis: Creatinine now downtrending continue to encourage oral hydration.  Bicarb potassium stable Recent Labs    12/29/23 1111 02/16/24 1038 02/17/24 0248 02/18/24 0234  BUN 35* 31* 44* 45*  CREATININE 1.37* 1.73* 2.01* 1.50*  CO2 21 17* 22 22  K 4.5 2.9* 3.9 4.0     Hypertension: BP remains well-controlled.  Continue verapamil .Holding chlorthalidone  and telmisartan  in the setting of AKI.   Hyperlipidemia Continue home rosuvastatin    Hypothyroidism Continue home Synthroid    GERD Continue Pepcid.   Carotid artery disease Peripheral vascular disease Continue home ASA, rosuvastatin    Arthritis Her Methotrexate held a week ago as above .  Followed by rheumatologist.  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  Class I Obesity w/ Body mass index is 33.11 kg/m.: Will benefit with PCP follow-up, weight loss,healthy lifestyle and outpatient sleep eval if not done.   DVT prophylaxis: heparin injection 5,000 Units Start: 02/16/24 1400 Code Status:   Code Status: Full Code Family Communication: plan of care discussed with patient at bedside.  Daughter updated at bedside 5/27 Patient status is: Remains hospitalized because of severity of illness Level of care: Progressive   Dispo: The patient is from: Home            Anticipated disposition: TBD Objective: Vitals last 24  hrs: Vitals:   02/17/24 2015  02/18/24 0019 02/18/24 0452 02/18/24 0730  BP: (!) 120/47 (!) 108/48 (!) 123/49 127/77  Pulse: 71 67 73 78  Resp: 20 17 20 20   Temp: 97.6 F (36.4 C) 97.8 F (36.6 C) 97.7 F (36.5 C) 98.2 F (36.8 C)  TempSrc: Oral Oral Oral Oral  SpO2: 91% 94% 95% 93%  Weight:   76.9 kg   Height:       Physical Examination: General exam: alert awake, HEENT:Oral mucosa moist, Ear/Nose WNL grossly Respiratory system: Bilaterally clear BS,no use of accessory muscle Cardiovascular system: S1 & S2 +, No JVD. Gastrointestinal system: Abdomen soft,NT,ND, BS+ Nervous System: Alert, awake, moving all extremities,and following commands. Extremities: LE edema neg,distal peripheral pulses palpable and warm.  Skin: No rashes,no icterus. MSK: Normal muscle bulk,tone, power   Data Reviewed: I have personally reviewed following labs and imaging studies ( see epic result tab) CBC: Recent Labs  Lab 02/16/24 1038 02/17/24 0248 02/18/24 0234  WBC 16.7* 15.1* 19.1*  NEUTROABS 12.2*  --   --   HGB 10.2* 9.2* 9.0*  HCT 30.7* 28.1* 27.4*  MCV 96.5 96.9 97.5  PLT 554* 486* 605*   CMP: Recent Labs  Lab 02/16/24 1038 02/16/24 1043 02/17/24 0248 02/18/24 0234  NA 132*  --  134* 134*  K 2.9*  --  3.9 4.0  CL 98  --  101 103  CO2 17*  --  22 22  GLUCOSE 114*  --  149* 135*  BUN 31*  --  44* 45*  CREATININE 1.73*  --  2.01* 1.50*  CALCIUM  9.3  --  9.5 9.3  MG  --  1.7  --   --    GFR: Estimated Creatinine Clearance: 25.6 mL/min (A) (by C-G formula based on SCr of 1.5 mg/dL (H)). Recent Labs  Lab 02/16/24 1038 02/17/24 0248  AST 37 34  ALT 32 32  ALKPHOS 51 49  BILITOT 0.9 0.7  PROT 6.3* 5.8*  ALBUMIN 2.9* 2.6*   No results for input(s): "LIPASE", "AMYLASE" in the last 168 hours. No results for input(s): "AMMONIA" in the last 168 hours. Coagulation Profile: No results for input(s): "INR", "PROTIME" in the last 168 hours. Unresulted Labs (From admission, onward)    None      Antimicrobials/Microbiology: Anti-infectives (From admission, onward)    None     No results found for: "SDES", "SPECREQUEST", "CULT", "REPTSTATUS"  Procedures: Medications reviewed:  Scheduled Meds:  acetaminophen   1,000 mg Oral BID   aspirin EC  81 mg Oral Daily   heparin  5,000 Units Subcutaneous Q8H   levothyroxine   112 mcg Oral Daily   methylPREDNISolone  (SOLU-MEDROL ) injection  40 mg Intravenous Daily   metoprolol  succinate  100 mg Oral Daily   mirabegron  ER  25 mg Oral Daily   rosuvastatin   20 mg Oral Daily   sodium chloride  flush  3 mL Intravenous Q12H   verapamil   360 mg Oral Q breakfast  Continuous Infusions: Lesa Rape, MD [facility] 02/18/2024, 10:29 AM

## 2024-02-18 NOTE — Progress Notes (Signed)
 Mobility Specialist Progress Note:   02/18/24 1120  Mobility  Activity Ambulated with assistance in room  Level of Assistance Standby assist, set-up cues, supervision of patient - no hands on  Assistive Device None  Distance Ambulated (ft) 15 ft  Activity Response Tolerated well  Mobility Referral Yes  Mobility visit 1 Mobility  Mobility Specialist Start Time (ACUTE ONLY) 1120  Mobility Specialist Stop Time (ACUTE ONLY) 1132  Mobility Specialist Time Calculation (min) (ACUTE ONLY) 12 min   Pt received at sink, agreeable to sit in chair. Required 3LO2 to maintain SpO2 with short distance. Once back in chair, attempted to titrate to 2LO2.  Pt desat to 85%. No SOB noted, pt left in chair with all needs met. SpO2 92% on 2LO2.   Oneda Big Mobility Specialist Please contact via SecureChat or  Rehab office at [phone]

## 2024-02-19 ENCOUNTER — Telehealth: Payer: Self-pay | Admitting: Pulmonary Disease

## 2024-02-19 ENCOUNTER — Other Ambulatory Visit (HOSPITAL_COMMUNITY): Payer: Self-pay

## 2024-02-19 DIAGNOSIS — J9601 Acute respiratory failure with hypoxia: Secondary | ICD-10-CM | POA: Diagnosis not present

## 2024-02-19 LAB — BASIC METABOLIC PANEL WITH GFR
Anion gap: 10 (ref 5–15)
BUN: 50 mg/dL — ABNORMAL HIGH (ref 8–23)
CO2: 23 mmol/L (ref 22–32)
Calcium: 9.1 mg/dL (ref 8.9–10.3)
Chloride: 103 mmol/L (ref 98–111)
Creatinine, Ser: 1.78 mg/dL — ABNORMAL HIGH (ref 0.44–1.00)
GFR, Estimated: 28 mL/min — ABNORMAL LOW (ref >60)
Glucose, Bld: 134 mg/dL — ABNORMAL HIGH (ref 70–99)
Potassium: 3.9 mmol/L (ref 3.5–5.1)
Sodium: 136 mmol/L (ref 135–145)

## 2024-02-19 MED ORDER — PREDNISONE 20 MG PO TABS
ORAL_TABLET | ORAL | 0 refills | Status: DC
  Filled 2024-02-19: qty 60, 57d supply, fill #0

## 2024-02-19 MED ORDER — PANTOPRAZOLE SODIUM 40 MG PO TBEC
40.0000 mg | DELAYED_RELEASE_TABLET | Freq: Every day | ORAL | 0 refills | Status: DC
  Filled 2024-02-19: qty 30, 30d supply, fill #0

## 2024-02-19 MED ORDER — PREDNISONE 20 MG PO TABS: 40.0000 mg | ORAL_TABLET | Freq: Every day | ORAL | Status: DC

## 2024-02-19 NOTE — Progress Notes (Signed)
 Nurse requested Mobility Specialist to perform oxygen saturation test with pt which includes removing pt from oxygen both at rest and while ambulating.  Below are the results from that testing.     Patient Saturations on Room Air at Rest = spO2 96%  Patient Saturations on Room Air while Ambulating = sp02 85% .  Patient Saturations on 2 Liters of oxygen while Ambulating = sp02 90%  At end of testing pt left in room on RA   Reported results to nurse.   Oneda Big Mobility Specialist Please contact via SecureChat or  Rehab office at [phone]

## 2024-02-19 NOTE — Plan of Care (Signed)
 Patient discharging home with 02 to use when sob and with activity per md

## 2024-02-19 NOTE — Discharge Summary (Signed)
 Physician Discharge Summary  ICESS BERTONI GEX:[id number] DOB: 1940/11/29 DOA: 02/16/2024  PCP: Azalia Leo, MD  Admit date: 02/16/2024 Discharge date: 02/19/2024 Recommendations for Outpatient Follow-up:  Follow up with PCP in 1 weeks-call for appointment Please obtain BMP/CBC in one week Follow-up with rheumatology/pulmonary for further management  Discharge Dispo: home Discharge Condition: Stable Code Status:   Code Status: Full Code Diet recommendation:  Diet Order             Diet regular Room service appropriate? Yes; Fluid consistency: Thin; Fluid restriction: 2000 mL Fluid  Diet effective now                    Brief/Interim Summary: Brief Narrative/Hospital Course: 83 y.o. female with medical history significant of hypertension, hyperlipidemia, hypothyroidism, GERD, CKD 3A, carotid artery disease, peripheral vascular disease, obesity, IBS, arthritis presenting with worsening shortness of breath  of several weeks. She stopped her methotrexate a week ago as there was some thought this could be causing her symptoms.  Does report a recent productive cough. The ED patient is brought for EMS hypoxic 70%, workup with hyponatremia hypokalemia 2.9 creatinine 1.7 Elamin 2.9 BNP 173 procalcitonin 0.17 leukocytosis 16.7 thrombocytosis with anemia 10.2, D-dimer elevated 2.4. COVID RSV influenza negative Chest x-ray no active disease, CT chest > Patchy ground-glass opacity in the lungs bilaterally with subpleural reticulation most prominent in the mid and lower lungs-compatible with interstitial lung disease. Superimposed component of acute infectious/inflammatory etiology suspected.2-3 mm  LUL pulmonary nodule-no follow-up needed if patient is low-risk. Patient was admitted for acute hypoxic respiratory failure.She is overall feeling better with IV steroids, eager to go home today planning to discharge on oral steroid home oxygen and follow-up outpatient with  pulmonary  Subjective: Alert awake oriented resting comfortably Eager to go home today Overnight afebrile BP stable on nasal cannula 2 L Labs reviewed creatinine at 1.7  Discharge diagnosis:   Acute respiratory failure with hypoxia CT finding concerning for GGO bilaterally-ILD DVT D-dimer: For EMS spo2- 70% on RA-needing NRB,DuoNeb. Workup so far unremarkable-BNP 173, no fluid overload no obvious pneumonia.Viral workup negative Pro-Cal is ok 0.17 + D DIMER- BUT VQ scan was negative for PE. Unable to obtain CTA due to AKI Last echo ef normal in feb. Off hydrochlorothiazide  to chlorthalidone  and off amlodipine . Suspect could be from methotrexate related given onset correlating with starting oral medication but could be due to rheumatoid arthritis related and ILD per CT chest. Patient pulmonary input, continue steroid taper , check home o2 need and plan for dc home  Discussed with pulmonary>Switch to oral prednisone  40 mg for 2 days, then 30 mg for 2 days, then 20 mg until seen in follow-up in pulmonary clinic/rheumatology .  Patient did well on room air however on ambulation needed 2 L oxygen and oxygen will be set up Will need high-resolution CT at some point  Thrombocytosis: monitor  Normocytic anemia: Hb stable, monitor  AKI on CKD 3A Hypokalemia-resolved Elevated gap acidosis: Creatinine around 1.5-1.7 now peak 2.0. con to encourage oral hydration.Bicarb potassium stable. BUN up likely from steroid Advise outpatient BMP check with PCP in 1 week Recent Labs    12/29/23 1111 02/16/24 1038 02/17/24 0248 02/18/24 0234 02/19/24 0245  BUN 35* 31* 44* 45* 50*  CREATININE 1.37* 1.73* 2.01* 1.50* 1.78*  CO2 21 17* 22 22 23   K 4.5 2.9* 3.9 4.0 3.9     Hypertension: Stable on home metoprolol , verapamil .Holding chlorthalidone  and telmisartan  in the setting  of AKI.   Hyperlipidemia Continue home rosuvastatin    Hypothyroidism Continue home Synthroid    GERD Continue Pepcid.    Carotid artery disease Peripheral vascular disease Continue home ASA, rosuvastatin    Arthritis Her Methotrexate held a week ago as above .  Followed by rheumatologist.  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  Class I Obesity w/ Body mass index is 33.12 kg/m.: Will benefit with PCP follow-up, weight loss,healthy lifestyle and outpatient sleep eval if not done.   Discharge Exam: Vitals:   02/19/24 0712 02/19/24 1117  BP: (!) 148/59 109/61  Pulse: 77 (!) 57  Resp: 20 20  Temp: 98.3 F (36.8 C) (!) 96.5 F (35.8 C)  SpO2: 96% 93%   General: Pt is alert, awake, not in acute distress Cardiovascular: RRR, S1/S2 +, no rubs, no gallops Respiratory: CTA bilaterally, no wheezing, no rhonchi Abdominal: Soft, NT, ND, bowel sounds + Extremities: no edema, no cyanosis  Discharge Instructions  Discharge Instructions     Discharge instructions   Complete by: As directed    Follow-up with PCP to check care renal function, continue holding her chlorthalidone  and telmisartan  until seen by PCP, monitor blood pressure daily at home.  Please call call MD or return to ER for similar or worsening recurring problem that brought you to hospital or if any fever,nausea/vomiting,abdominal pain, uncontrolled pain, chest pain,  shortness of breath or any other alarming symptoms.  Please follow-up your doctor as instructed in a week time and call the office for appointment.  Please avoid alcohol, smoking, or any other illicit substance and maintain healthy habits including taking your regular medications as prescribed.  You were cared for by a hospitalist during your hospital stay. If you have any questions about your discharge medications or the care you received while you were in the hospital after you are discharged, you can call the unit and ask to speak with the hospitalist on call if the hospitalist that took care of you is not available.  Once you are  discharged, your primary care physician will handle any further medical issues. Please note that NO REFILLS for any discharge medications will be authorized once you are discharged, as it is imperative that you return to your primary care physician (or establish a relationship with a primary care physician if you do not have one) for your aftercare needs so that they can reassess your need for medications and monitor your lab values   Increase activity slowly   Complete by: As directed       Allergies as of 02/19/2024       Reactions   Zebeta Other (See Comments)   Does not work, per patient.   Ace Inhibitors Other (See Comments)   cough   Hydroxychloroquine Sulfate Rash        Medication List     PAUSE taking these medications    methotrexate (PF) 50 MG/2ML injection Wait to take this until your doctor or other care provider tells you to start again. 0.8 mLs once a week.       STOP taking these medications    chlorthalidone  25 MG tablet Commonly known as: HYGROTON    famotidine 20 MG tablet Commonly known as: PEPCID   furosemide  40 MG tablet Commonly known as: LASIX    telmisartan  80 MG tablet Commonly known as: MICARDIS        TAKE these medications    acetaminophen  500 MG tablet Commonly known as: TYLENOL  Take 1,000 mg by mouth 2 (  two) times daily.   aspirin EC 81 MG tablet Take 81 mg by mouth daily.   calcium  carbonate 500 MG chewable tablet Commonly known as: TUMS - dosed in mg elemental calcium  Chew 1 tablet by mouth daily as needed for indigestion or heartburn.   dimenhyDRINATE 50 MG tablet Commonly known as: DRAMAMINE Take 50 mg by mouth every 8 (eight) hours as needed for dizziness or nausea.   estradiol  0.1 MG/GM vaginal cream Commonly known as: ESTRACE  Apply to skin externally nightly for the next 7 days and then decrease to using twice weekly.   folic acid 1 MG tablet Commonly known as: FOLVITE Take 1 mg by mouth daily.   Gemtesa  75 MG  Tabs Generic drug: Vibegron  TAKE 1 TABLET BY MOUTH DAILY   KRILL OIL PO Take 1 tablet by mouth daily.   levothyroxine  112 MCG tablet Commonly known as: SYNTHROID  Take 1 tablet (112 mcg total) by mouth daily.   loratadine 10 MG tablet Commonly known as: CLARITIN Take 10 mg by mouth daily as needed for allergies.   metoprolol  succinate 100 MG 24 hr tablet Commonly known as: TOPROL -XL TAKE 1 TABLET BY MOUTH DAILY   multivitamin tablet Take 1 tablet by mouth daily.   pantoprazole 40 MG tablet Commonly known as: Protonix Take 1 tablet (40 mg total) by mouth daily.   polyethylene glycol 17 g packet Commonly known as: MIRALAX / GLYCOLAX Take 17 g by mouth every evening.   predniSONE  20 MG tablet Commonly known as: DELTASONE  Take 40mg  x 2 days, 30 mg x 2 days then 20 mg daily Start taking on: Feb 20, 2024   rosuvastatin  20 MG tablet Commonly known as: CRESTOR  TAKE 1 TABLET BY MOUTH DAILY   verapamil  240 MG CR tablet Commonly known as: CALAN -SR Take 1.5 tablets (360 mg total) by mouth daily with breakfast.   vitamin D3 50 MCG (2000 UT) Caps Take 2,000 Units by mouth 3 (three) times a week. Tuesdays, Thursdays and Saturdays   VITAMIN-B COMPLEX PO Take 1 tablet by mouth daily with breakfast.               Durable Medical Equipment  (From admission, onward)           Start     Ordered   02/19/24 1120  For home use only DME oxygen  Once       Comments: Use oxygen on ambulation  Question Answer Comment  Length of Need Lifetime   Mode or (Route) Nasal cannula   Liters per Minute 2   Frequency Continuous (stationary and portable oxygen unit needed)   Oxygen delivery system Gas      02/19/24 1120            Follow-up Information     Azalia Leo, MD Follow up.   Specialty: Internal Medicine Why: bmp chekc in 5-7 days Contact information: [address] [place] [address] [phone]                Allergies  Allergen Reactions    Zebeta Other (See Comments)    Does not work, per patient.   Ace Inhibitors Other (See Comments)    cough   Hydroxychloroquine Sulfate Rash    The results of significant diagnostics from this hospitalization (including imaging, microbiology, ancillary and laboratory) are listed below for reference.    Microbiology: Recent Results (from the past 240 hours)  Resp panel by RT-PCR (RSV, Flu A&B, Covid) Anterior Nasal Swab  Status: None   Collection Time: 02/17/24  2:54 AM   Specimen: Anterior Nasal Swab  Result Value Ref Range Status   SARS Coronavirus 2 by RT PCR NEGATIVE NEGATIVE Final   Influenza A by PCR NEGATIVE NEGATIVE Final   Influenza B by PCR NEGATIVE NEGATIVE Final    Comment: (NOTE) The Xpert Xpress SARS-CoV-2/FLU/RSV plus assay is intended as an aid in the diagnosis of influenza from Nasopharyngeal swab specimens and should not be used as a sole basis for treatment. Nasal washings and aspirates are unacceptable for Xpert Xpress SARS-CoV-2/FLU/RSV testing.  Fact Sheet for Patients: BloggerCourse.com  Fact Sheet for Healthcare Providers: SeriousBroker.it  This test is not yet approved or cleared by the [place]  FDA and has been authorized for detection and/or diagnosis of SARS-CoV-2 by FDA under an Emergency Use Authorization (EUA). This EUA will remain in effect (meaning this test can be used) for the duration of the COVID-19 declaration under Section 564(b)(1) of the Act, 21 U.S.C. section 360bbb-3(b)(1), unless the authorization is terminated or revoked.     Resp Syncytial Virus by PCR NEGATIVE NEGATIVE Final    Comment: (NOTE) Fact Sheet for Patients: BloggerCourse.com  Fact Sheet for Healthcare Providers: SeriousBroker.it  This test is not yet approved or cleared by the [place]  FDA and has been authorized for detection and/or diagnosis of  SARS-CoV-2 by FDA under an Emergency Use Authorization (EUA). This EUA will remain in effect (meaning this test can be used) for the duration of the COVID-19 declaration under Section 564(b)(1) of the Act, 21 U.S.C. section 360bbb-3(b)(1), unless the authorization is terminated or revoked.  Performed at [facility], [address], [place], [place] [address]     Procedures/Studies: NM Pulmonary Perfusion Result Date: 02/17/2024 CLINICAL DATA:  Short of breath.  Concern pulmonary embolism EXAM: NUCLEAR MEDICINE PERFUSION LUNG SCAN TECHNIQUE: Perfusion images were obtained in multiple projections after intravenous injection of radiopharmaceutical. RADIOPHARMACEUTICALS:  4.2 mCi Tc-33m MAA COMPARISON:  Chest radiograph 02/16/2024 FINDINGS: No wedge-shaped peripheral perfusion defect within LEFT or RIGHT lung to suggest acute pulmonary embolism. Normal perfusion pattern. IMPRESSION: No evidence acute pulmonary embolism. Electronically Signed   By: Deboraha Fallow M.D.   On: 02/17/2024 17:08   CT CHEST WO CONTRAST Result Date: 02/17/2024 CLINICAL DATA:  Interstitial lung disease. EXAM: CT CHEST WITHOUT CONTRAST TECHNIQUE: Multidetector CT imaging of the chest was performed following the standard protocol without IV contrast. RADIATION DOSE REDUCTION: This exam was performed according to the departmental dose-optimization program which includes automated exposure control, adjustment of the mA and/or kV according to patient size and/or use of iterative reconstruction technique. COMPARISON:  None Available. FINDINGS: Cardiovascular: The heart size is normal. No substantial pericardial effusion. Coronary artery calcification is evident. Moderate atherosclerotic calcification is noted in the wall of the thoracic aorta. Mitral annular calcification evident. Mediastinum/Nodes: Scattered small mediastinal lymph nodes. No evidence for gross hilar lymphadenopathy although assessment is limited by the lack  of intravenous contrast on the current study. The esophagus has normal imaging features. There is no axillary lymphadenopathy. Lungs/Pleura: Patchy ground-glass opacity is seen in the lungs bilaterally with subpleural reticulation most prominent in the mid and lower lungs. No dense consolidative airspace disease. 2-3 mm left upper lobe nodule identified on image 59/7. Insert no pleural effusion. Upper Abdomen: Tiny calcified gallstone evident. The visualized portion of the upper abdomen is otherwise unremarkable. Musculoskeletal: No worrisome lytic or sclerotic osseous abnormality. IMPRESSION: 1. Patchy ground-glass opacity in the lungs bilaterally with subpleural  reticulation most prominent in the mid and lower lungs. Imaging features are compatible with interstitial lung disease. Superimposed component of acute infectious/inflammatory etiology suspected. 2. 2-3 mm left upper lobe pulmonary nodule. No follow-up needed if patient is low-risk.This recommendation follows the consensus statement: Guidelines for Management of Incidental Pulmonary Nodules Detected on CT Images: From the Fleischner Society 2017; Radiology 2017; 284:228-243. 3.  Aortic Atherosclerosis (ICD10-I70.0). Electronically Signed   By: Donnal Fusi M.D.   On: 02/17/2024 06:15   DG Chest Port 1 View Result Date: 02/16/2024 CLINICAL DATA:  Shortness of breath.  Productive cough. EXAM: PORTABLE CHEST 1 VIEW COMPARISON:  08/14/2021 FINDINGS: The heart size and mediastinal contours are within normal limits. No focal consolidation or pleural effusion. The visualized skeletal structures are unremarkable. IMPRESSION: No active disease. Electronically Signed   By: Marlyce Sine M.D.   On: 02/16/2024 11:12   XR Lumbar Spine 2-3 Views Result Date: 01/29/2024 XRs of the lumbar spine from 01/29/2024  were independently reviewed and interpreted, showing multiple levels with decreased disc height. L2 compression fracture with similar height loss when compared  to prior films on 12/29/2023.Aaron Aas No new fractures seen. Spondylolisthesis at L4/5.    Labs: BNP (last 3 results) Recent Labs    02/16/24 1038  BNP 173.4*   Basic Metabolic Panel: Recent Labs  Lab 02/16/24 1038 02/16/24 1043 02/17/24 0248 02/18/24 0234 02/19/24 0245  NA 132*  --  134* 134* 136  K 2.9*  --  3.9 4.0 3.9  CL 98  --  101 103 103  CO2 17*  --  22 22 23   GLUCOSE 114*  --  149* 135* 134*  BUN 31*  --  44* 45* 50*  CREATININE 1.73*  --  2.01* 1.50* 1.78*  CALCIUM  9.3  --  9.5 9.3 9.1  MG  --  1.7  --   --   --   Liver Function Tests: Recent Labs  Lab 02/16/24 1038 02/17/24 0248  AST 37 34  ALT 32 32  ALKPHOS 51 49  BILITOT 0.9 0.7  PROT 6.3* 5.8*  ALBUMIN 2.9* 2.6*   Recent Labs  Lab 02/16/24 1038 02/17/24 0248 02/18/24 0234  WBC 16.7* 15.1* 19.1*  NEUTROABS 12.2*  --   --   HGB 10.2* 9.2* 9.0*  HCT 30.7* 28.1* 27.4*  MCV 96.5 96.9 97.5  PLT 554* 486* 605*  No results for input(s): "TSH", "T4TOTAL", "T3FREE", "THYROIDAB" in the last 72 hours.  Invalid input(s): "FREET3" Anemia work up No results for input(s): "VITAMINB12", "FOLATE", "FERRITIN", "TIBC", "IRON", "RETICCTPCT" in the last 72 hours. Urinalysis    Component Value Date/Time   BILIRUBINUR neg 07/29/2019 0954   PROTEINUR Negative 07/29/2019 0954   UROBILINOGEN 0.2 07/29/2019 0954   NITRITE neg 07/29/2019 0954   LEUKOCYTESUR Negative 07/29/2019 0954   Sepsis Labs Recent Labs  Lab 02/16/24 1038 02/17/24 0248 02/18/24 0234  WBC 16.7* 15.1* 19.1*   Microbiology Recent Results (from the past 240 hours)  Resp panel by RT-PCR (RSV, Flu A&B, Covid) Anterior Nasal Swab     Status: None   Collection Time: 02/17/24  2:54 AM   Specimen: Anterior Nasal Swab  Result Value Ref Range Status   SARS Coronavirus 2 by RT PCR NEGATIVE NEGATIVE Final   Influenza A by PCR NEGATIVE NEGATIVE Final   Influenza B by PCR NEGATIVE NEGATIVE Final    Comment: (NOTE) The Xpert Xpress SARS-CoV-2/FLU/RSV  plus assay is intended as an aid in the diagnosis of influenza from  Nasopharyngeal swab specimens and should not be used as a sole basis for treatment. Nasal washings and aspirates are unacceptable for Xpert Xpress SARS-CoV-2/FLU/RSV testing.  Fact Sheet for Patients: BloggerCourse.com  Fact Sheet for Healthcare Providers: SeriousBroker.it  This test is not yet approved or cleared by the [place]  FDA and has been authorized for detection and/or diagnosis of SARS-CoV-2 by FDA under an Emergency Use Authorization (EUA). This EUA will remain in effect (meaning this test can be used) for the duration of the COVID-19 declaration under Section 564(b)(1) of the Act, 21 U.S.C. section 360bbb-3(b)(1), unless the authorization is terminated or revoked.     Resp Syncytial Virus by PCR NEGATIVE NEGATIVE Final    Comment: (NOTE) Fact Sheet for Patients: BloggerCourse.com  Fact Sheet for Healthcare Providers: SeriousBroker.it  This test is not yet approved or cleared by the [place]  FDA and has been authorized for detection and/or diagnosis of SARS-CoV-2 by FDA under an Emergency Use Authorization (EUA). This EUA will remain in effect (meaning this test can be used) for the duration of the COVID-19 declaration under Section 564(b)(1) of the Act, 21 U.S.C. section 360bbb-3(b)(1), unless the authorization is terminated or revoked.  Performed at [facility], [address] [place], [place] [address]    Time coordinating discharge: 35 minutes  SIGNED: Lesa Rape, MD  [facility] 02/19/2024, 11:20 AM  If 7PM-7AM, please contact night-coverage [email or web address]

## 2024-02-19 NOTE — Telephone Encounter (Signed)
 Please arrange for hospital follow-up appointment with me on June 16 at 1 PM Okay to double book ILD, new oxygen set up for follow-up

## 2024-02-19 NOTE — Progress Notes (Signed)
  [name]  Mobility Specialist   Progress Notes    Signed   Date of Service: 02/19/2024 11:09 AM   Signed      Nurse requested Mobility Specialist to perform oxygen saturation test with pt which includes removing pt from oxygen both at rest and while ambulating.  Below are the results from that testing.      Patient Saturations on Room Air at Rest = spO2 96%   Patient Saturations on Room Air while Ambulating = sp02 85% .   Patient Saturations on 2 Liters of oxygen while Ambulating = sp02 90%   At end of testing pt left in room on RA    Reported results to nurse.    [name] Mobility Specialist Please contact via SecureChat or  Rehab office at [phone]    [employer] (From admission, onward)        Start     Ordered  02/19/24 1120  For home use only DME oxygen  Once      Comments: Use oxygen on ambulation Question Answer Comment Length of Need Lifetime  Mode or (Route) Nasal cannula  Liters per Minute 2  Frequency Continuous (stationary and portable oxygen unit needed)  Oxygen delivery system Gas    02/19/24 1120

## 2024-02-19 NOTE — Care Management Important Message (Signed)
 Important Message  Patient Details  Name: [name] MRN: [id number] Date of Birth: 08/17/1941   Important Message Given:  Yes - Medicare IM     Janith Melnick 02/19/2024, 11:07 AM

## 2024-02-19 NOTE — Telephone Encounter (Signed)
 Patient has been scheduled. Letter mailed. This should also reflect on her MyChart and on her discharge papers.

## 2024-02-19 NOTE — TOC Transition Note (Signed)
 Transition of Care [facility]) - Discharge Note   Patient Details  Name: [name] MRN: [id number] Date of Birth: Sep 10, 1941  Transition of [facility]) CM/SW Contact:  Jeani Mill, RN Phone Number: 02/19/2024, 11:32 AM   Clinical Narrative:    Patient stable for discharge. Spoke to daughter and patient. Patient is agreeable to home 02.  Notified Mitch with [employer] of home 02 needs.  Address confirmed.  Daughter will transport home.    Final next level of care: Home/Self Care Barriers to Discharge: Barriers Resolved   Patient Goals and CMS Choice Patient states their goals for this hospitalization and ongoing recovery are:: return home          Discharge Placement             Home          Discharge Plan and Services Additional resources added to the After Visit Summary for                  DME Arranged: Oxygen DME Agency: [employer] Date DME Agency Contacted: 02/19/24 Time DME Agency Contacted: 1130 Representative spoke with at DME Agency: Harriet Limber            Social Drivers of Health (SDOH) Interventions SDOH Screenings   Food Insecurity: Patient Declined (02/18/2024)  Housing: Patient Declined (02/18/2024)  Transportation Needs: Patient Declined (02/18/2024)  Utilities: Patient Declined (02/17/2024)  Depression (PHQ2-9): Low Risk  (07/01/2022)  Social Connections: Patient Declined (02/17/2024)  Tobacco Use: Medium Risk (02/16/2024)     Readmission Risk Interventions    02/19/2024   11:32 AM 02/17/2024    4:33 PM  Readmission Risk Prevention Plan  Post Dischage Appt Complete Complete  Medication Screening Complete Complete  Transportation Screening Complete Complete

## 2024-02-19 NOTE — Progress Notes (Signed)
 Mobility Specialist Progress Note:   02/19/24 1105  Mobility  Activity Ambulated with assistance in hallway  Level of Assistance Standby assist, set-up cues, supervision of patient - no hands on  Assistive Device Four wheel walker  Distance Ambulated (ft) 350 ft  Activity Response Tolerated well  Mobility Referral Yes  Mobility visit 1 Mobility  Mobility Specialist Start Time (ACUTE ONLY) 1105  Mobility Specialist Stop Time (ACUTE ONLY) 1120  Mobility Specialist Time Calculation (min) (ACUTE ONLY) 15 min   Pt agreeable to mobility session. Required no physical assistance throughout. Attempted ambulation on RA, however required 2LO2 to maintain >89%. Pt with minor SOB at EOS. Back in chair with all needs met, on RA; SpO2 96%.  Oneda Big Mobility Specialist Please contact via SecureChat or  Rehab office at [phone]

## 2024-02-19 NOTE — Progress Notes (Signed)
 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  She was noted to be hypoxic to 70% on room air.  Initial labs significant for leukocytosis 16.7, slight high procalcitonin 0.17, hemoglobin 10.3, D-dimer 2.4. COVID flu RSV was negative. CT chest without contrast showed patchy groundglass opacity bilateral lungs with subpleural reticulation mostly in the mid and lower lung fields On questioning, she reports a chronic cough for 4 years which has been attributed to allergies.  She recently saw cardiology and echo showed normal LV function with increased mitral valve calcification.  She was taken off Norvasc  and verapamil  dose was increased. She reports a recent diagnosis of rheumatoid arthritis She developed arthritis in her wrist and hands, saw Dr. Meredith Stalls was not able to tolerate hydroxychloroquine or leflunomide and was started on methotrexate subcu grams every week since she was not able to tolerate oral.  She stopped this about a week ago due to symptoms of shortness of breath, lightheadedness and weight loss. She also reports a fall few months ago fracture admits to being sedentary.  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 rate 20, height 5' (1.524 m), weight 76.9 kg, last menstrual period 09/24/1979, SpO2 96%.        Intake/Output Summary (Last 24 hours) at 02/19/2024 1040 Last data filed at 02/19/2024 0908 Gross per 24 hour  Intake 277 ml  Output 1315 ml  Net -1038 ml   Filed Weights   02/17/24 0454 02/18/24 0452 02/19/24 0420  Weight: 77.7 kg 76.9 kg 76.9 kg    Examination: Gen. Pleasant, obese, in no distress, normal affect ENT - no pallor,icterus, no post nasal drip Neck: No JVD, no thyromegaly, no carotid bruits Lungs: no use of accessory muscles, no dullness to percussion, dry crackles right base Cardiovascular: S1 S 2 regular, no murmur no peripheral edema Abdomen: soft and non-tender, no hepatosplenomegaly, BS normal. Musculoskeletal: No deformities, no cyanosis or clubbing Neuro:  alert, non focal, no tremors   Labs show stable improved sodium, normal electrolytes, BUN/creatinine 50/1.8, increased leukocytosis  Resolved problem list   Assessment and Plan   Acute hypoxic respiratory failure Interstitial lung disease  -This could be related to methotrexate (onset correlates with starting this medication] however it could be related to rheumatoid arthritis itself -chronic cough for 4 years and previous chest x-ray showing some coarse interstitium makes this possible.  Lastly this could be IPF but would not rush to give this diagnosis in the presence of active RA Leukocytosis raises the question of acute viral illness although she does not describe typical URI symptoms. Pulmonary embolism seems  less likely but we are obligated to rule this out given her sedentary lifestyle last few weeks and a positive D-dimer Marginal improvement with steroids again points towards methotrexate  pneumonitis  - Switch to oral prednisone  40 mg for 2 days, then 30 mg for 2 days, then 20 mg until seen in follow-up in pulmonary clinic/rheumatology - She will need high-resolution CT chest at some point - She will need oxygen at discharge but hopefully will come off in a couple of weeks, please document ambulatory saturations to qualify her for oxygen  Best Practice (right click and "Reselect all SmartList Selections" daily)    Code Status:  full code Last date of multidisciplinary goals of care discussion [NA]  Labs   CBC: Recent Labs  Lab 02/16/24 1038 02/17/24 0248 02/18/24 0234  WBC 16.7* 15.1* 19.1*  NEUTROABS 12.2*  --   --   HGB 10.2* 9.2* 9.0*  HCT 30.7* 28.1* 27.4*  MCV 96.5 96.9 97.5  PLT 554* 486* 605*    Basic Metabolic Panel: Recent Labs  Lab 02/16/24 1038 02/16/24 1043 02/17/24 0248 02/18/24 0234 02/19/24 0245  NA 132*  --  134* 134* 136  K 2.9*  --  3.9 4.0 3.9  CL 98  --  101 103 103  CO2 17*  --  22 22 23   GLUCOSE 114*  --  149* 135* 134*  BUN 31*  --  44* 45* 50*  CREATININE 1.73*  --  2.01* 1.50* 1.78*  CALCIUM  9.3  --  9.5 9.3 9.1  MG  --  1.7  --   --   --    GFR: Estimated Creatinine Clearance: 21.6 mL/min (A) (by C-G formula based on SCr of 1.78 mg/dL (H)). Recent Labs  Lab 02/16/24 1038 02/16/24 1043 02/17/24 0248 02/18/24 0234  PROCALCITON  --  0.17  --   --   WBC 16.7*  --  15.1* 19.1*    Liver Function Tests: Recent Labs  Lab 02/16/24 1038 02/17/24 0248  AST 37 34  ALT 32 32  ALKPHOS 51 49  BILITOT 0.9 0.7  PROT 6.3* 5.8*  ALBUMIN 2.9* 2.6*   No results for input(s): "LIPASE", "AMYLASE" in the last 168 hours. No results for input(s): "AMMONIA" in the last 168 hours.  ABG No results found for: "PHART", "PCO2ART", "PO2ART", "HCO3", "TCO2", "ACIDBASEDEF", "O2SAT"   Coagulation Profile: No results for input(s): "INR", "PROTIME" in the last 168 hours.  Cardiac Enzymes: No results for input(s): "CKTOTAL", "CKMB",  "CKMBINDEX", "TROPONINI" in the last 168 hours.  HbA1C: Hgb A1c MFr Bld  Date/Time Value Ref Range Status  10/18/2019 09:52 AM 5.7 (H) 4.8 - 5.6 % Final    Comment:             Prediabetes: 5.7 - 6.4          Diabetes: >6.4          Glycemic control for adults with diabetes: <7.0   02/16/2019 10:43 AM 5.6 4.8 - 5.6 % Final    Comment:             Prediabetes: 5.7 - 6.4          Diabetes: >6.4          Glycemic control for adults with diabetes: <7.0     CBG: No results for input(s): "GLUCAP" in the last 168 hours.   Celene Coins MD. [facility]. [facility] Pager : [phone]  If no response to pager ,  please call [phone] until 7 pm After 7:00 pm call Elink  [phone]   02/19/2024

## 2024-02-20 ENCOUNTER — Telehealth: Payer: Self-pay | Admitting: Internal Medicine

## 2024-02-20 NOTE — Telephone Encounter (Signed)
 Called patient back about message. Patient was told to stop chlorthalidone , furosemide , and telmisartan . Patient stated these were discontinue due to kidney function. Patient stated she feels fine. Asked patient what her BP is running, but she is unable to take it until her daughter returns. Asked her to report her BP back to us  when she takes it.

## 2024-02-20 NOTE — Telephone Encounter (Signed)
  Pt called back to provide her BP 116/55 HR 65

## 2024-02-20 NOTE — Telephone Encounter (Signed)
 Pt c/o medication issue:  1. Name of Medication: chlorthalidone  (HYGROTON ) 25 MG tablet  telmisartan  (MICARDIS ) 80 MG tablet  2. How are you currently taking this medication (dosage and times per day)? Not taking  3. Are you having a reaction (difficulty breathing--STAT)? No   4. What is your medication issue? Per Daughter pt was in the hospital and they took pt off these medications due to her kidneys. Daughter wants to know if she soul be on anything else to replace these medications

## 2024-02-20 NOTE — Telephone Encounter (Signed)
 Called pt advised of MD recommendations:  Chart reviewed:  Patient has recent hospitalization.  Her MD team noted worsening diarrhea from collagenous colitis.  They note that her breathing issues were from her lung disease.  Patient has an AKI in the setting of acute on chronic diarrhea with creatinine increase from 1.5-> 2.0  They planned PCP f/u in one week to recheck labs (see DC summary).  - ambulatory blood pressures would help with assessment  - based on DC summary, patient must be getting a PCP follow up- at that time if blood pressure is elevated, diarrhea is better, and creatinine has improved her medication can be slowly restarted  - based on so many changes in her non-cardiac care, I'm unable to make high quality recommendations at this time.   Gloriann Larger, MD FASE [facility]   Pt expresses has an OV with PCP next week.  BP today was 116/55-65 taken around 2:15 pm. Pt had no further questions or concerns.

## 2024-02-23 ENCOUNTER — Ambulatory Visit (INDEPENDENT_AMBULATORY_CARE_PROVIDER_SITE_OTHER): Admitting: Audiology

## 2024-03-01 ENCOUNTER — Telehealth (HOSPITAL_BASED_OUTPATIENT_CLINIC_OR_DEPARTMENT_OTHER): Payer: Self-pay

## 2024-03-01 DIAGNOSIS — R0609 Other forms of dyspnea: Secondary | ICD-10-CM

## 2024-03-01 NOTE — Telephone Encounter (Signed)
 Copied from CRM [id number]. Topic: Clinical - Order For Equipment >> Mar 01, 2024  9:12 AM [name] wrote: Reason for CRM: Pt stated Dr. Villa Greaser wanted her to stay on oxygen at night until her appt on 6/16 at 1pm. Pt needs Dr. Villa Greaser to send over a prescription for [facility] machine to [facility] ([employer]). Pt stated she only had their phone number [phone] no fax number. Pt's phone number is [phone] ok to leave a vm.

## 2024-03-08 ENCOUNTER — Ambulatory Visit (HOSPITAL_BASED_OUTPATIENT_CLINIC_OR_DEPARTMENT_OTHER): Admitting: Pulmonary Disease

## 2024-03-08 ENCOUNTER — Encounter (HOSPITAL_BASED_OUTPATIENT_CLINIC_OR_DEPARTMENT_OTHER): Payer: Self-pay | Admitting: Pulmonary Disease

## 2024-03-08 VITALS — BP 119/52 | HR 53 | Ht 60.0 in | Wt 166.0 lb

## 2024-03-08 DIAGNOSIS — J849 Interstitial pulmonary disease, unspecified: Secondary | ICD-10-CM | POA: Diagnosis not present

## 2024-03-08 DIAGNOSIS — Z87891 Personal history of nicotine dependence: Secondary | ICD-10-CM

## 2024-03-08 DIAGNOSIS — J9611 Chronic respiratory failure with hypoxia: Secondary | ICD-10-CM

## 2024-03-08 MED ORDER — PREDNISONE 5 MG PO TABS: ORAL_TABLET | ORAL | 1 refills | Status: DC

## 2024-03-08 MED ORDER — PANTOPRAZOLE SODIUM 40 MG PO TBEC: 40.0000 mg | DELAYED_RELEASE_TABLET | Freq: Every day | ORAL | 5 refills | Status: AC

## 2024-03-08 NOTE — Progress Notes (Signed)
 Subjective:    Patient ID: [name], female    DOB: 01-Aug-1941, 83 y.o.   MRN: [id number]  HPI  Post hospital FU visit ILD - related to methotrexate (onset correlates with starting this medication] however it could be related to rheumatoid arthritis itself -chronic cough for 4 years and previous chest x-ray showing some coarse interstitium makes this possible.   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  She was noted to be hypoxic to 70% on room air.  Initial labs significant for leukocytosis 16.7, slight high procalcitonin 0.17, hemoglobin 10.3, D-dimer 2.4. COVID flu RSV was negative. CT chest without contrast showed patchy groundglass opacity bilateral lungs with subpleural reticulation mostly in the mid and lower lung fields On questioning, she reports a chronic cough for 4 years which has been attributed to allergies.  She recently saw cardiology and echo showed normal LV function with increased mitral valve calcification.  She was taken off Norvasc  and verapamil  dose was increased. She reports a recent diagnosis of rheumatoid arthritis She developed arthritis in her wrist and hands, saw Dr. Meredith Stalls was not able to tolerate hydroxychloroquine or leflunomide and was started on methotrexate subcu grams every week since she was not able to tolerate oral.  She stopped this about a week ago due to symptoms of shortness of breath, lightheadedness and weight loss. She also reports a fall few months ago fracture admits to being sedentary.  Discharged on 2L O2  presents for follow-up after hospital discharge. She is accompanied by her daughter, Tarri Farm.  She was recently hospitalized for interstitial lung disease and hypoxia and was prescribed oxygen therapy upon discharge. She uses two liters of oxygen at night but has been off oxygen during the day since the following Tuesday. Her breathing has significantly improved, though she still experiences some coughing  with spirometer use, which has improved.  She is on 20 mg of prednisone  daily since discharge. Her breathing has improved, allowing her to walk around her house without significant drops in oxygen levels. In the mornings, her oxygen saturation drops to 85% when she first gets up and walks to the bathroom, but it quickly returns to 90-92% after deep breaths. In the afternoons, her oxygen levels remain stable while walking around the house. Reviewed O2 sat record  She has a persistent cough that began after her first COVID-19 vaccination, initially thought to be allergies, and has persisted for four years. She has rheumatoid arthritis, previously managed with methotrexate, which has been discontinued.  Her white blood cell count was elevated at 21,000 upon hospital discharge, increased to 26,000, and is currently 24,000. She is scheduled for another test in two weeks to monitor this.   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 Also denies presyncope, palpitations, heartburn, abdominal pain, nausea, vomiting, diarrhea or change in bowel or urinary habits, dysuria,hematuria, rash, arthralgias, visual complaints, headache, numbness weakness or ataxia.     Objective:   Physical Exam  Gen. Pleasant, obese, in no distress ENT - no lesions, no post nasal drip  Neck: No JVD, no thyromegaly, no carotid bruits Lungs: no use of accessory muscles, no dullness to percussion, RT basal dry  rales no rhonchi   Cardiovascular: Rhythm regular, heart sounds  normal, no murmurs or gallops, no peripheral edema Musculoskeletal: No deformities, no cyanosis or clubbing , no tremors        Assessment & Plan:    Interstitial lung disease (ILD) Admitted with ILD and hypoxia. Differential includes methotrexate-induced pneumonitis versus rheumatoid arthritis ILD. Significant symptom improvement suggests methotrexate as the likely cause, though rheumatoid arthritis-related ILD remains possible due to chronic cough onset post-COVID vaccination.  - Conduct a walking test to assess oxygen levels and scarring. - Order a chest x-ray to compare with previous imaging. - Taper prednisone  from 20 mg to 5 mg over three weeks, then defer to rheumatologist for further management. - Communicate with rheumatologist Dr. Dannie Duval regarding current treatment and management plan.  Chronic resp failure with hypoxia Hypoxia present upon admission but improved significantly. Oxygen saturation drops to 85% with activity but recovers quickly with rest and deep breathing. Currently, oxygen is used only at night. - Evaluate oxygen requirement during walking test >> did not desat - Advise on continued nocturnal oxygen use based on test results.  Rheumatoid arthritis Potential contributor to ILD. Current prednisone  treatment has improved arthritis symptoms. Long-term prednisone  use is not ideal due to fracture risk. Alternative treatments needed to manage arthritis and potentially ILD if related. - Discuss alternative arthritis treatments with rheumatologist, considering biologics like Umbriel. - Reduce prednisone  to 5 mg and evaluate need for further reduction or alternative treatment with rheumatologist.  Elevated white blood cell count White blood cell count elevated to 26,000, likely due to steroid use. Decreased slightly but remains elevated at 24,000. Further monitoring planned. - Repeat white blood cell count the week of  June 23. - Monitor for further decrease in white blood cell count as prednisone  is tapered.

## 2024-03-08 NOTE — Patient Instructions (Addendum)
 X CXR today  X Decrease prednisone  to 15mg  daily x 1 week Then 10 mg adily x 1 week Then 5 mg daily x 1 week  - or as recommended by Dr Elberta Grebe refill protonix 

## 2024-03-11 ENCOUNTER — Other Ambulatory Visit (INDEPENDENT_AMBULATORY_CARE_PROVIDER_SITE_OTHER): Payer: Self-pay

## 2024-03-11 ENCOUNTER — Ambulatory Visit: Admitting: Orthopedic Surgery

## 2024-03-11 DIAGNOSIS — M545 Low back pain, unspecified: Secondary | ICD-10-CM | POA: Diagnosis not present

## 2024-03-11 NOTE — Progress Notes (Signed)
 Orthopedic Spine Surgery Office Note   Assessment: Patient is a 83 y.o. female with L2 compression fracture, pain slowly improving (~3.5 months out from injury)     Plan: -Patient has tried tylenol , advil, tramadol , norco -Patient is currently on steroids for an issue with her lungs, so she has been feeling well.  Will hold off on an injection with Dr. Daisey Dryer but could do that in the future if pain should return - Activity as tolerated, no spine specific precautions -Patient should return to office in 8-10 weeks, x-rays at next visit: AP/lateral lumbar     Patient expressed understanding of the plan and all questions were answered to the patient's satisfaction.    ___________________________________________________________________________     History:   Patient is a 83 y.o. female who presents today for follow-up on her L2 compression fracture.  She was recently hospitalized for 3 days.  She has been on steroids for a lung issue recently as well.  She said that the steroids have helped her significantly in terms of pain.  She is not having much back pain or leg pain.  She does have some soreness and stiffness in her lower lumbar around the belt line.  She has not been taking anything besides the steroids for pain and those are really for pain there for her lungs.  Has a history of stress incontinence.  No changes in bowel or bladder habits recently.  No saddle anesthesia.  Ambulating with a cane.   Treatments tried: tylenol , advil, tramadol , norco     Physical Exam:   General: no acute distress, appears stated age Neurologic: alert, answering questions appropriately, following commands Respiratory: unlabored breathing on room air, symmetric chest rise Psychiatric: appropriate affect, normal cadence to speech     MSK (spine):   -Strength exam                                                   Left                  Right EHL                              5/5                  5/5 TA                                  5/5                  5/5 GSC                             5/5                  5/5 Knee extension            5/5                  5/5 Hip flexion                    5/5                  5/5   -  Sensory exam                           Sensation intact to light touch in L3-S1 nerve distributions of bilateral lower extremities   Imaging: XRs of the lumbar spine from 03/11/2024  were independently reviewed and interpreted, showing L2 compression fracture with similar height loss when compared to prior films on 02/17/2024. Multiple levels with decreased disc height. Spondylolisthesis at L4/5. No acute fractures seen.   MRI of the lumbar spine from 06/21/2021 was previously independently reviewed and interpreted, showing L4/5 anterolisthesis. Lateral recess stenosis at L4/5. DDD at L2/3, L3/4, L4/5.      Patient name: [name] Patient MRN: [id number] Date of visit: 03/11/24

## 2024-03-15 ENCOUNTER — Telehealth: Payer: Self-pay

## 2024-03-15 NOTE — Telephone Encounter (Signed)
 Copied from CRM [id number]. Topic: Appointments - Scheduling Inquiry for Clinic >> Mar 15, 2024  8:07 AM Corean SAUNDERS wrote: Reason for CRM: Patient is requesting a call back to schedule her chest x ray per the order from Dr. Jude. >> Mar 15, 2024  9:17 AM Zebedee B wrote: This is for a chest xray will forward to the nurse  ATC X1. I left a vm for pt informing to call [phone] for scheduling. NFN

## 2024-03-18 ENCOUNTER — Telehealth: Payer: Self-pay | Admitting: Internal Medicine

## 2024-03-18 ENCOUNTER — Ambulatory Visit (HOSPITAL_BASED_OUTPATIENT_CLINIC_OR_DEPARTMENT_OTHER)

## 2024-03-18 DIAGNOSIS — I10 Essential (primary) hypertension: Secondary | ICD-10-CM

## 2024-03-18 DIAGNOSIS — J849 Interstitial pulmonary disease, unspecified: Secondary | ICD-10-CM

## 2024-03-18 MED ORDER — CHLORTHALIDONE 25 MG PO TABS
25.0000 mg | ORAL_TABLET | Freq: Every day | ORAL | 3 refills | Status: AC
Start: 2024-03-18 — End: ?

## 2024-03-18 NOTE — Telephone Encounter (Signed)
 Pt c/o BP issue: STAT if pt c/o blurred vision, one-sided weakness or slurred speech.  STAT if BP is GREATER than 180/120 TODAY.  STAT if BP is LESS than 90/60 and SYMPTOMATIC TODAY  1. What is your BP concern?   Patient is concerned regarding high BP readings  2. Have you taken any BP medication today?  Yes  3. What are your last 5 BP readings?  Today 194/80 175/75 199/73 (before medication)  4. Are you having any other symptoms (ex. Dizziness, headache, blurred vision, passed out)?  Jittery  Patient is concerned regarding her current medication regimen.  Patient is concerned her BP readings have been trending high.  Patient stated she had been taken off Toprol , Verapamil  and her diuretic.  Patient noted she has been having swelling in her feet and ankles and wants advice on next steps.  Patient noted can leave message on voice mail as she has appointments until after 2:00 pm.

## 2024-03-18 NOTE — Telephone Encounter (Signed)
 Pt states she has had Puffy feet and high Bps before morning medication for the past 4 or 5 days. BP 175/75, 199/73, 194/80 all before medications in the morning. Pt takes toprol  XL at night and states bp is normal to low normal. Stated she normally takes BP after medications as instructed.  Pt stated she has been on a deltasone  taper since May 27. Starting at 40mg , currently at 10mg  until Tuesday and then 5 mg.  Pt is requesting something for swelling and a possible change in BP medication.  May leave detailed message on VM as pt states she will be out today.

## 2024-03-18 NOTE — Telephone Encounter (Signed)
 Called pt advised of MD recommendation:  Chart reviewed - Recently established patient with new leg swelling in the setting of previously being on two CCB and now in prednisone  - RN described asymptomatic hypertensive urgency - Has NP Call with swelling in the past; patient has some antihypertensive allergies - Trial of chlorthalidone  25 mg PO daily and BMP in 1-2 weeks. - needs f/u with my APP team for labile BP   Stanly Leavens, MD FASE [facility]  Pt reports swelling to feet and ankles started about 5 days ago.  Denies increased salt intake does not wear compression socks and is elevating levels.  Pt is agreeable to plan order for BMP placed and released for future draw.

## 2024-03-20 NOTE — Progress Notes (Unsigned)
 Cardiology Office Note    Date:  03/22/2024  ID:  [name], Headings Feb 28, 1941, MRN [id number] PCP:  Stephane Leita DEL, MD  Cardiologist:  Candyce Reek, MD  Electrophysiologist:  None   Chief Complaint: Follow up for hypertension   History of Present Illness: .    [name] is a 83 y.o. female with visit-pertinent history of hypertensive heart disease, hyperlipidemia, carotid artery stenosis and hypothyroidism.  Patient has been managed by vascular for carotid artery stenosis and by her PCP for hypothyroidism, she previously had the left lobe of her thyroid  removed due to a growing nodule which was not cancerous.  She has history of mildly leaky heart valve and mild dilation of the aorta noted on echocardiogram in 2020.  Patient was previously followed by Dr. Reek, she established with Dr. Santo in January, 2025.  At that time patient reported occasional deep breathing at night but denied any shortness of breath.  She remained active participating in [employer], [address] and church activities.  Echocardiogram on 10/30/2023 indicated LVEF of 55 to 60%, no RWMA, diastolic parameters were indeterminate, RV systolic function and size was normal, normal PASP, noted to have caseous mitral annular calcification with mild mitral valve regurgitation, mean mitral valve gradient 3.0 mmHg, severe mitral annular calcification.  There was mild calcification of the aortic valve with no evidence of stenosis.  On 02/15/2024 patient's daughter called the after-hours call service reporting that patient had a fall several months prior and broke vertebrae, she did become overall sedentary and was healing slowly.  She was also being given methotrexate injections.  Patient reporting feeling increasingly short of breath over the past several days mostly with exertion, denied chest pain, lightheadedness, syncope, leg edema, rapid weight gain.  On 5/26 patient presented to the ED with worsening shortness of  breath, patient was hyponatremic and hypokalemic, creatinine 1.7 BNP 173 and procalcitonin 0.17, leukocytosis 16.7, anemic at 10.2 with D-dimer elevated at 2.4.  Chest x-ray showed no active disease CT of chest showed patchy groundglass opacity in the lungs bilaterally with subpleural reticulation most prominent in the mid and lower lungs compatible with interstitial lung disease felt to be superimposed component of acute infectious/inflammatory etiology.  She was admitted for acute hypoxic respiratory failure, treated with IV steroids and oxygen.  Patient was referred to pulmonary for follow-up.  Given AKI patient's telmisartan  and chlorthalidone  were held.  She was continued on metoprolol  and verapamil .  On 03/18/2024 patient notified the office of elevated pressures of 175/75, 199/73, 194/80 before taking medications.  Patient reported that she had been on a prednisone  taper since May.  She was she also reported increased leg swelling. Reviewed by Dr. Santo, patient was started on chlorthalidone  25 mg daily.  Today patient reports that she is doing well overall.  She reports that her lower extremity edema has improved in the last few days.  She reports that her blood pressure remains elevated in the morning prior to taking her medications, reports this morning it was 194/80, will typically improve after taking her morning medications.  She denies any headache, dizziness or vision changes.  She denies any chest pain.  She reports chronic and stable dyspnea, denies any significant changes.  She reports that she had lab work completed at her PCPs office a week or 2 ago, reports that her creatinine returned to her baseline.  She is planning to move into [place] independent livings in the next few months.  ROS: .  Today she denies chest pain, fatigue, palpitations, melena, hematuria, hemoptysis, diaphoresis, weakness, presyncope, syncope, orthopnea, and PND.  All other systems are reviewed and  otherwise negative. Studies Reviewed: SABRA   EKG:  EKG is not ordered today.  CV Studies: Cardiac studies reviewed are outlined and summarized above. Otherwise please see EMR for full report. Cardiac Studies & Procedures   ______________________________________________________________________________________________   STRESS TESTS  MYOCARDIAL PERFUSION IMAGING 03/31/2019  Narrative  Nuclear stress EF: 77%. The left ventricular ejection fraction is hyperdynamic (>65%).  There was no ST segment deviation noted during stress.  Defect 1: There is a small defect of mild severity present in the basal anterior, mid anterior and apical anterior location. This appears to be most consistent with breast attenuation  This is a low risk study. There is no evidence of ischemia or previous infarction.  The study is normal.   ECHOCARDIOGRAM  ECHOCARDIOGRAM COMPLETE 10/30/2023  Narrative ECHOCARDIOGRAM REPORT    Patient Name:   [name] Date of Exam: 10/30/2023 Medical Rec #:  [id number]       Height:       60.0 in Accession #:    [id number]      Weight:       182.6 lb Date of Birth:  July 22, 1941        BSA:          1.796 m Patient Age:    82 years        BP:           153/67 mmHg Patient Gender: F               HR:           73 bpm. Exam Location:  [address]  Procedure: 2D Echo, 3D Echo, Cardiac Doppler, Color Doppler and Strain Analysis  Indications:    I77.810 Aortic root dilation  History:        Patient has prior history of Echocardiogram examinations, most recent 04/02/2019. Carotid Disease and CKD; Risk Factors:HLD.  Sonographer:    Waldo Guadalajara RCS Referring Phys: [id number] MAHESH A CHANDRASEKHAR  IMPRESSIONS   1. Left ventricular ejection fraction, by estimation, is 55 to 60%. Left ventricular ejection fraction by 3D volume is 57 %. The left ventricle has normal function. The left ventricle has no regional wall motion abnormalities. Left ventricular diastolic parameters  are indeterminate. The average left ventricular global longitudinal strain is -19.9 %. The global longitudinal strain is normal. 2. Right ventricular systolic function is normal. The right ventricular size is normal. There is normal pulmonary artery systolic pressure. 3. Caseous mitral annular calcification noted. The mitral valve is degenerative. Mild mitral valve regurgitation. The mean mitral valve gradient is 3.0 mmHg with average heart rate of 72 bpm. Severe mitral annular calcification. 4. The aortic valve is tricuspid. There is mild calcification of the aortic valve. Aortic valve regurgitation is mild. Aortic valve sclerosis is present, with no evidence of aortic valve stenosis. 5. The inferior vena cava is normal in size with greater than 50% respiratory variability, suggesting right atrial pressure of 3 mmHg.  Comparison(s): Prior images reviewed side by side. Mitral valve calcification has progressed.  FINDINGS Left Ventricle: Left ventricular ejection fraction, by estimation, is 55 to 60%. Left ventricular ejection fraction by 3D volume is 57 %. The left ventricle has normal function. The left ventricle has no regional wall motion abnormalities. The average left ventricular global longitudinal strain is -19.9 %. The global longitudinal strain is normal.  The left ventricular internal cavity size was normal in size. There is no left ventricular hypertrophy. Left ventricular diastolic parameters are indeterminate.  Right Ventricle: The right ventricular size is normal. No increase in right ventricular wall thickness. Right ventricular systolic function is normal. There is normal pulmonary artery systolic pressure. The tricuspid regurgitant velocity is 2.34 m/s, and with an assumed right atrial pressure of 3 mmHg, the estimated right ventricular systolic pressure is 24.9 mmHg.  Left Atrium: Left atrial size was normal in size.  Right Atrium: Right atrial size was normal in  size.  Pericardium: Trivial pericardial effusion is present. The pericardial effusion is surrounding the apex. Presence of epicardial fat layer.  Mitral Valve: Caseous mitral annular calcification noted. The mitral valve is degenerative in appearance. Severe mitral annular calcification. Mild mitral valve regurgitation. MV peak gradient, 7.7 mmHg. The mean mitral valve gradient is 3.0 mmHg with average heart rate of 72 bpm.  Tricuspid Valve: The tricuspid valve is normal in structure. Tricuspid valve regurgitation is mild . No evidence of tricuspid stenosis.  Aortic Valve: The aortic valve is tricuspid. There is mild calcification of the aortic valve. Aortic valve regurgitation is mild. Aortic regurgitation PHT measures 330 msec. Aortic valve sclerosis is present, with no evidence of aortic valve stenosis.  Pulmonic Valve: The pulmonic valve was normal in structure. Pulmonic valve regurgitation is trivial. No evidence of pulmonic stenosis.  Aorta: The aortic root and ascending aorta are structurally normal, with no evidence of dilitation.  Venous: The inferior vena cava is normal in size with greater than 50% respiratory variability, suggesting right atrial pressure of 3 mmHg.  IAS/Shunts: The atrial septum is grossly normal.   LEFT VENTRICLE PLAX 2D LVIDd:         4.30 cm         Diastology LVIDs:         3.10 cm         LV e' medial:    6.42 cm/s LV PW:         0.80 cm         LV E/e' medial:  14.4 LV IVS:        0.60 cm         LV e' lateral:   8.49 cm/s LVOT diam:     1.60 cm         LV E/e' lateral: 10.9 LV SV:         53 LV SV Index:   30              2D LVOT Area:     2.01 cm        Longitudinal Strain 2D Strain GLS  -21.0 % (A2C): 2D Strain GLS  -19.2 % (A3C): 2D Strain GLS  19.5 % (A4C): 2D Strain GLS  -19.9 % Avg:  3D Volume EF LV 3D EF:    Left ventricul ar ejection fraction by 3D volume is 57 %.  3D Volume EF: 3D EF:        57 % LV EDV:       80 ml LV  ESV:       35 ml LV SV:        46 ml  RIGHT VENTRICLE RV Basal diam:  3.20 cm RV S prime:     11.40 cm/s TAPSE (M-mode): 1.9 cm RVSP:           24.9 mmHg  LEFT ATRIUM  Index        RIGHT ATRIUM           Index LA diam:        4.00 cm 2.23 cm/m   RA Pressure: 3.00 mmHg LA Vol (A2C):   41.8 ml 23.28 ml/m  RA Area:     9.58 cm LA Vol (A4C):   39.6 ml 22.05 ml/m  RA Volume:   18.60 ml  10.36 ml/m LA Biplane Vol: 41.6 ml 23.17 ml/m AORTIC VALVE LVOT Vmax:   101.00 cm/s LVOT Vmean:  78.200 cm/s LVOT VTI:    0.264 m AI PHT:      330 msec  AORTA Ao Root diam: 2.60 cm Ao Asc diam:  2.60 cm  MITRAL VALVE                TRICUSPID VALVE MV Area (PHT): 3.45 cm     TR Peak grad:   21.9 mmHg MV Area VTI:   1.58 cm     TR Vmax:        234.00 cm/s MV Peak grad:  7.7 mmHg     Estimated RAP:  3.00 mmHg MV Mean grad:  3.0 mmHg     RVSP:           24.9 mmHg MV Vmax:       1.39 m/s MV Vmean:      72.9 cm/s    SHUNTS MV Decel Time: 220 msec     Systemic VTI:  0.26 m MV E velocity: 92.20 cm/s   Systemic Diam: 1.60 cm MV A velocity: 131.00 cm/s MV E/A ratio:  0.70  Stanly Leavens MD Electronically signed by Stanly Leavens MD Signature Date/Time: 10/30/2023/3:17:09 PM    Final          ______________________________________________________________________________________________       Current Reported Medications:.    Current Meds  Medication Sig   acetaminophen  (TYLENOL ) 500 MG tablet Take 1,000 mg by mouth 2 (two) times daily.   aspirin  81 MG EC tablet Take 81 mg by mouth daily.   B Complex Vitamins (VITAMIN-B COMPLEX PO) Take 1 tablet by mouth daily with breakfast.   calcium  carbonate (TUMS - DOSED IN MG ELEMENTAL CALCIUM ) 500 MG chewable tablet Chew 1 tablet by mouth daily as needed for indigestion or heartburn.   chlorthalidone  (HYGROTON ) 25 MG tablet Take 1 tablet (25 mg total) by mouth daily.   Cholecalciferol (VITAMIN D3) 50 MCG (2000 UT) CAPS  Take 2,000 Units by mouth 3 (three) times a week. Tuesdays, Thursdays and Saturdays   dimenhyDRINATE (DRAMAMINE) 50 MG tablet Take 50 mg by mouth every 8 (eight) hours as needed for dizziness or nausea.   estradiol  (ESTRACE ) 0.1 MG/GM vaginal cream Apply to skin externally nightly for the next 7 days and then decrease to using twice weekly.   folic acid (FOLVITE) 1 MG tablet Take 1 mg by mouth daily.   GEMTESA  75 MG TABS TAKE 1 TABLET BY MOUTH DAILY   hydrALAZINE (APRESOLINE) 50 MG tablet Take 1 tablet (50 mg total) by mouth in the morning and at bedtime.   KRILL OIL PO Take 1 tablet by mouth daily.   levothyroxine  (SYNTHROID ) 112 MCG tablet Take 1 tablet (112 mcg total) by mouth daily.   loratadine (CLARITIN) 10 MG tablet Take 10 mg by mouth daily as needed for allergies.   metoprolol  succinate (TOPROL -XL) 100 MG 24 hr tablet TAKE 1 TABLET BY MOUTH DAILY   Multiple Vitamin (MULTIVITAMIN) tablet Take 1  tablet by mouth daily.     pantoprazole  (PROTONIX ) 40 MG tablet Take 1 tablet (40 mg total) by mouth daily.   polyethylene glycol (MIRALAX  / GLYCOLAX ) 17 g packet Take 17 g by mouth every evening.   predniSONE  (DELTASONE ) 5 MG tablet Take as directed   rosuvastatin  (CRESTOR ) 20 MG tablet TAKE 1 TABLET BY MOUTH DAILY   verapamil  (CALAN -SR) 240 MG CR tablet Take 1.5 tablets (360 mg total) by mouth daily with breakfast.    Physical Exam:    VS:  BP (!) 150/73 Comment: taken on dinamap  Pulse 74   Ht 5' 2.5 (1.588 m)   Wt 170 lb 6.4 oz (77.3 kg)   LMP 09/24/1979   SpO2 94%   BMI 30.67 kg/m    Wt Readings from Last 3 Encounters:  03/22/24 170 lb 6.4 oz (77.3 kg)  03/08/24 166 lb (75.3 kg)  02/19/24 169 lb 9.6 oz (76.9 kg)    GEN: Well nourished, well developed in no acute distress NECK: No JVD; No carotid bruits CARDIAC: RRR, no murmurs, rubs, gallops RESPIRATORY:  Clear to auscultation without rales, wheezing or rhonchi  ABDOMEN: Soft, non-tender, non-distended EXTREMITIES:  Mild  nonpitting ankle edema; No acute deformity    Asessement and Plan:SABRA    Hypertension: Blood pressure today 150/73. Patient reports at home that her blood pressure been elevated, unfortunately she left her blood pressure log in the car.  She will bring to follow-up.  Reports that this morning her blood pressure was 194/80, typically does improve after taking her morning medications, she remains asymptomatic with this.  She notes that she has wide fluctuations with her blood pressure, prior to her morning medications will be significantly high and typically improves throughout the day.  She has noted improvement in her lower extremity edema with initiation of chlorthalidone .  She notes that her blood pressure is very sensitive to salt.  Given recent AKI will not restart telmisartan  at this time, will start hydralazine 50 mg twice daily.  She reports she will return for lab work on Thursday.  Encouraged patient to monitor her blood pressure at home and to bring log to follow-up.  Continue chlorthalidone  25 mg daily, metoprolol  succinate 100 mg daily, verapamil  360 mg daily.  Lower extremity edema: Patient notes significant improvement since starting on chlorthalidone .  Patient reports that her lower extremity edema is extremely sensitive to heat and increased salt intake, notes that her edema worsened significantly with a recent heat wave.  She has noted improvement in recent days.  She reports that her breathing is stable, denies orthopnea or PND.  Continue chlorthalidone  25 mg daily, patient reports that she will return for BMET on Thursday.   ILD: Patient reports that she is currently finishing a prednisone  taper.  Currently followed by Dr. Jude, to have follow-up in August.   Severe mitral annular calcification: Noted on echo on 10/30/2023 to have severe mitral annular calcification.  Denies any chest pain or changes in breathing.  Hyperlipidemia: Patient's LDL goal less than 70.  Continue rosuvastatin  20  mg daily.  Carotid artery stenosis: Monitored and managed per vascular specialist.  Hypothyroidism: Patient with history of partial thyroidectomy performed for noncancerous nodule.  She is followed by her PCP.    Disposition: F/u with PharmD in four weeks, Zaynab Chipman, NP in 8 weeks.   Signed, Melessa Cowell D Cannen Dupras, NP

## 2024-03-22 ENCOUNTER — Ambulatory Visit: Attending: Cardiology | Admitting: Cardiology

## 2024-03-22 ENCOUNTER — Ambulatory Visit: Payer: Self-pay | Admitting: Pulmonary Disease

## 2024-03-22 ENCOUNTER — Encounter: Payer: Self-pay | Admitting: Cardiology

## 2024-03-22 VITALS — BP 150/73 | HR 74 | Ht 62.5 in | Wt 170.4 lb

## 2024-03-22 DIAGNOSIS — I3481 Nonrheumatic mitral (valve) annulus calcification: Secondary | ICD-10-CM

## 2024-03-22 DIAGNOSIS — I10 Essential (primary) hypertension: Secondary | ICD-10-CM

## 2024-03-22 DIAGNOSIS — R600 Localized edema: Secondary | ICD-10-CM

## 2024-03-22 DIAGNOSIS — I6523 Occlusion and stenosis of bilateral carotid arteries: Secondary | ICD-10-CM

## 2024-03-22 DIAGNOSIS — E890 Postprocedural hypothyroidism: Secondary | ICD-10-CM

## 2024-03-22 DIAGNOSIS — E782 Mixed hyperlipidemia: Secondary | ICD-10-CM

## 2024-03-22 MED ORDER — HYDRALAZINE HCL 50 MG PO TABS: 50.0000 mg | ORAL_TABLET | Freq: Two times a day (BID) | ORAL | 3 refills | Status: DC

## 2024-03-22 NOTE — Patient Instructions (Signed)
 Medication Instructions:  Take Hydralazine 50 mg twice a day  *If you need a refill on your cardiac medications before your next appointment, please call your pharmacy*  Lab Work: On Thursday we are going to need you to get a Bmet If you have labs (blood work) drawn today and your tests are completely normal, you will receive your results only by: MyChart Message (if you have MyChart) OR A paper copy in the mail If you have any lab test that is abnormal or we need to change your treatment, we will call you to review the results.  Testing/Procedures: No testing  Follow-Up: At [facility], you and your health needs are our priority.  As part of our continuing mission to provide you with exceptional heart care, our providers are all part of one team.  This team includes your primary Cardiologist (physician) and Advanced Practice Providers or APPs (Physician Assistants and Nurse Practitioners) who all work together to provide you with the care you need, when you need it.  Your next appointment:   8-10 week(s)  Provider:   Katlyn West, NP   We recommend signing up for the patient portal called MyChart.  Sign up information is provided on this After Visit Summary.  MyChart is used to connect with patients for Virtual Visits (Telemedicine).  Patients are able to view lab/test results, encounter notes, upcoming appointments, etc.  Non-urgent messages can be sent to your provider as well.   To learn more about what you can do with MyChart, go to [email or web address].

## 2024-03-25 ENCOUNTER — Ambulatory Visit (INDEPENDENT_AMBULATORY_CARE_PROVIDER_SITE_OTHER): Payer: Self-pay | Admitting: Audiology

## 2024-03-25 DIAGNOSIS — H903 Sensorineural hearing loss, bilateral: Secondary | ICD-10-CM

## 2024-03-25 NOTE — Progress Notes (Signed)
  [address] [place], [place] [address] [phone]  Hearing Aid Check     FREDRIKA CANBY comes for a scheduled appointment for a hearing aid check.    Accompanied ab:[id number]   Right Left  Hearing aid manufacturer Renaldo Kirschner [id number] SN: [id number] Renaldo Kirschner SHAD SN: [id number]  Hearing aid style Receiver in the canal Receiver in the canal  Hearing aid battery rechargeable rechargeable  Receiver    Dome/ custom earpiece Large closed dome Large closed domes  Retention wire    Warranty expiration date 08-21-2024 08-21-2024  Loss and Damage unknown unknown  Additional accessories Expiration date    Initial fitting date 10-21-20 08-21-21  Device was fit at: [facility]    Chief complaint: Patient reports cannot hear well form the aids.  Actions taken: Inspection of the device and listening check showed that both devices needed to cleaned. Patient thought she heard better. Advised to return for a hearing test+ hearing aid check if she thought she had hearing changes. Patient was also made aware of the recommendation to send hearing aids out in October 2025 before their warranty ends. She may want then to send them one at a time for the overall check.  Services fee: $0 was paid at checkout.     Recommend: Return for a hearing aid check at least around October 2025. Return for a hearing evaluation and to see an ENT, if concerns with hearing changes arise.    Richey Doolittle MARIE LEROUX-MARTINEZ, AUD

## 2024-03-26 ENCOUNTER — Ambulatory Visit: Payer: Self-pay | Admitting: Internal Medicine

## 2024-03-26 LAB — BASIC METABOLIC PANEL WITH GFR
BUN/Creatinine Ratio: 23 (ref 12–28)
BUN: 27 mg/dL (ref 8–27)
CO2: 22 mmol/L (ref 20–29)
Calcium: 9.7 mg/dL (ref 8.7–10.3)
Chloride: 100 mmol/L (ref 96–106)
Creatinine, Ser: 1.18 mg/dL — ABNORMAL HIGH (ref 0.57–1.00)
Glucose: 95 mg/dL (ref 70–99)
Potassium: 4.8 mmol/L (ref 3.5–5.2)
Sodium: 139 mmol/L (ref 134–144)
eGFR: 46 mL/min/1.73 — ABNORMAL LOW (ref >59)

## 2024-03-29 ENCOUNTER — Other Ambulatory Visit: Payer: Self-pay | Admitting: Internal Medicine

## 2024-03-29 DIAGNOSIS — Z0000 Encounter for general adult medical examination without abnormal findings: Secondary | ICD-10-CM

## 2024-04-06 ENCOUNTER — Telehealth (HOSPITAL_BASED_OUTPATIENT_CLINIC_OR_DEPARTMENT_OTHER): Payer: Self-pay

## 2024-04-06 NOTE — Telephone Encounter (Signed)
 Pt notified she will call back in a month to let us  know

## 2024-04-06 NOTE — Telephone Encounter (Signed)
 Copied from CRM [id number]. Topic: Clinical - Medical Advice >> Apr 06, 2024  9:25 AM [name] wrote: Reason for CRM: Patient states she doesn't feel like she needs her oxygen at night anymore but is requesting permission from her provider Dr.Alva to discontinue. Patient states she will monitor it closely and if it falls she will go back on oxygen.   Callback number: [phone]

## 2024-04-22 ENCOUNTER — Other Ambulatory Visit: Payer: Self-pay | Admitting: Interventional Cardiology

## 2024-04-23 ENCOUNTER — Encounter: Payer: Self-pay | Admitting: Pharmacist Clinician (PhC)/ Clinical Pharmacy Specialist

## 2024-04-23 ENCOUNTER — Ambulatory Visit: Attending: Cardiology | Admitting: Pharmacist Clinician (PhC)/ Clinical Pharmacy Specialist

## 2024-04-23 VITALS — BP 121/69 | HR 60

## 2024-04-23 DIAGNOSIS — I119 Hypertensive heart disease without heart failure: Secondary | ICD-10-CM

## 2024-04-23 NOTE — Assessment & Plan Note (Addendum)
 Assessment: BP is uncontrolled in office BP 121/69 mmHg;. Home readings still elevated to 170's in the mornings Tolerates chlorthalidone  25 mg daily, hydralazine  50 mg bid, metoprolol  succ 100 mg daily, verapamil  360 mg daily well, Denies SOB, palpitation, chest pain, headaches, Some feet swelling, unsure if weather vs medications Reiterated the importance of regular exercise and low salt diet   Plan:  Continue taking chlorthalidone  25 mg daily, hydralazine  50 mg bid, metoprolol  succ 100 mg daily, verapamil  360 mg daily  (divide as below to determine if we can avoid am elevattions) AM - chlorthalidone , verapamil  120 mg, hydralazine  50 mg PM - metoprolol  succ 100 mg, verapamil  240 mg, hydralazine  50 mg Patient to keep record of BP readings with heart rate and report to us  at the next visit If pressure still elevated overall, consider switching metoprolol  succ to carvedilol Patient to follow up with Katlyn in September Labs ordered today:  none

## 2024-04-23 NOTE — Patient Instructions (Signed)
 Follow up appointment: WITH KATLYLN IN SEPTEMBER  Take your BP meds as follows:   AM:  VERAPAMIL  120 MG (1/2 TAB), CHLORTHALIDONE  25 MG,  HYDRALAZINE  50 MG   PM:  VERAPAMIL  240 MG (1 TAB), METOPROLOL  100 MG, HYDRALAZINE  50 MG  Check your blood pressure at home twice daily and keep record of the readings.  Your blood pressure goal is < 130/80  To check your pressure at home you will need to:  1. Sit up in a chair, with feet flat on the floor and back supported. Do not cross your ankles or legs. 2. Rest your left arm so that the cuff is about heart level. If the cuff goes on your upper arm,  then just relax the arm on the table, arm of the chair or your lap. If you have a wrist cuff, we  suggest relaxing your wrist against your chest (think of it as Pledging the Flag with the  wrong arm).  3. Place the cuff snugly around your arm, about 1 inch above the crook of your elbow. The  cords should be inside the groove of your elbow.  4. Sit quietly, with the cuff in place, for about 5 minutes. After that 5 minutes press the power  button to start a reading. 5. Do not talk or move while the reading is taking place.  6. Record your readings on a sheet of paper. Although most cuffs have a memory, it is often  easier to see a pattern developing when the numbers are all in front of you.  7. You can repeat the reading after 1-3 minutes if it is recommended  Make sure your bladder is empty and you have not had caffeine or tobacco within the last 30 min  Always bring your blood pressure log with you to your appointments. If you have not brought your monitor in to be double checked for accuracy, please bring it to your next appointment.  You can find a list of quality blood pressure cuffs at [email or web address]  Important lifestyle changes to control high blood pressure  Intervention  Effect on the BP  Lose extra pounds and watch your waistline Weight loss is one of the most effective lifestyle changes  for controlling blood pressure. If you're overweight or obese, losing even a small amount of weight can help reduce blood pressure. Blood pressure might go down by about 1 millimeter of mercury (mm Hg) with each kilogram (about 2.2 pounds) of weight lost.  Exercise regularly As a general goal, aim for at least 30 minutes of moderate physical activity every day. Regular physical activity can lower high blood pressure by about 5 to 8 mm Hg.  Eat a healthy diet Eating a diet rich in whole grains, fruits, vegetables, and low-fat dairy products and low in saturated fat and cholesterol. A healthy diet can lower high blood pressure by up to 11 mm Hg.  Reduce salt (sodium) in your diet Even a small reduction of sodium in the diet can improve heart health and reduce high blood pressure by about 5 to 6 mm Hg.  Limit alcohol One drink equals 12 ounces of beer, 5 ounces of wine, or 1.5 ounces of 80-proof liquor.  Limiting alcohol to less than one drink a day for women or two drinks a day for men can help lower blood pressure by about 4 mm Hg.   If you have any questions or concerns please use My Chart to send questions or call the office  at [phone]

## 2024-04-23 NOTE — Progress Notes (Signed)
 Office Visit    Patient Name: [name] Date of Encounter: 04/23/2024  Primary Care Provider:  Stephane Leita DEL, MD Primary Cardiologist:  Candyce Reek, MD  Chief Complaint    Hypertension  Significant Past Medical History   HLD No recent labs, on rosuvastatin  20  Carotid stenosis 12/24 rICA 60-79%, left 40-59%.    hypothyroid TSH WNL on levothyroxine  112    Allergies  Allergen Reactions   Zebeta Other (See Comments)    Does not work, per patient.   Ace Inhibitors Other (See Comments)    cough   Hydroxychloroquine Sulfate Rash    History of Present Illness    [name] is a 83 y.o. female patient of Dr Emilee, in the office today for hypertension evaluation.   She was seen by Katlyn West at the end of June, with a BP of 150/73.  This was follow up after hospitalization in May for hypoxic respiratory failure and treated with IV steroids and O2.  Her telmisartan  and chlorthalidone  were held, 2/2 AKI with hyponatremia and hypokalemia.  Chlorthalidone  was restarted after discharge, and Katlyn added hydralazine  50 mg bid.  She was encouraged to monitor home BP readings.  She is in the office today for follow up.   She notes that her feet have been swelling slightly since late June, better now that she is back on chlorthalidone , but not gone completely.  Unsure if related to heat/humidity or medications.  Elevating her feet during the day helps.    Blood Pressure Goal:  130/80  Current Medications:  chlorthalidone  25 mg daily, hydralazine  50 mg bid, metoprolol  succ 100 mg daily, verapamil  360 mg daily  Previously tried:  ACEI - cough  Social Hx:      Tobacco: no quit in 1977  Alcohol: socially, none in the past few months  Caffeine: 1 cup of coffee in the am, occasionally second cup if ; some [employer]  Diet: moving to [place], will eat 1 meal there, then 2 meal is condo with small kitchen; breakfast usually yogurt w/fruit/cereal added; english muffin;  oatmeal w/fruit; protein is more chicken, some beef, limits pork; likes peanut butter; salads with kidney beans, veggies fresh and frozen; rinses canned when buys; doesn't snack much     Exercise: broken vertebra last year, limited; does walk a little (cpmression fracture at L3, 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     Accessory Clinical Findings    Lab Results  Component Value Date   CREATININE 1.18 (H) 03/25/2024   BUN 27 03/25/2024   NA 139 03/25/2024   K 4.8 03/25/2024   CL 100 03/25/2024   CO2 22 03/25/2024   Lab Results  Component Value Date   ALT 32 02/17/2024   AST 34 02/17/2024   ALKPHOS 49 02/17/2024   BILITOT 0.7 02/17/2024   Lab Results  Component Value Date   HGBA1C 5.7 (H) 10/18/2019    Home Medications    Current Outpatient Medications  Medication Sig Dispense Refill   acetaminophen  (TYLENOL ) 500 MG tablet Take 1,000 mg by mouth 2 (two) times daily.     aspirin  81 MG EC tablet Take 81 mg by mouth daily.     B Complex Vitamins (VITAMIN-B COMPLEX PO) Take 1 tablet by mouth daily with breakfast.     calcium  carbonate (TUMS - DOSED IN MG  ELEMENTAL CALCIUM ) 500 MG chewable tablet Chew 1 tablet by mouth daily as needed for indigestion or heartburn.     chlorthalidone  (HYGROTON ) 25 MG tablet Take 1 tablet (25 mg total) by mouth daily. 90 tablet 3   Cholecalciferol (VITAMIN D3) 50 MCG (2000 UT) CAPS Take 2,000 Units by mouth 3 (three) times a week. Tuesdays, Thursdays and Saturdays     estradiol  (ESTRACE ) 0.1 MG/GM vaginal cream Apply to skin externally nightly for the next 7 days and then decrease to using twice weekly. (Patient taking differently: 1 Applicatorful as needed. Apply to skin externally nightly for the next 7 days and then decrease to using twice weekly.) 42.5 g 1   folic acid (FOLVITE) 1 MG tablet Take 1 mg by mouth daily.      GEMTESA  75 MG TABS TAKE 1 TABLET BY MOUTH DAILY 90 tablet 4   hydrALAZINE  (APRESOLINE ) 50 MG tablet Take 1 tablet (50 mg total) by mouth in the morning and at bedtime. 180 tablet 3   KRILL OIL PO Take 1 tablet by mouth daily.     levothyroxine  (SYNTHROID ) 112 MCG tablet Take 1 tablet (112 mcg total) by mouth daily. 90 tablet 3   loratadine (CLARITIN) 10 MG tablet Take 10 mg by mouth daily as needed for allergies.     metoprolol  succinate (TOPROL -XL) 100 MG 24 hr tablet TAKE 1 TABLET BY MOUTH DAILY 90 tablet 2   Multiple Vitamin (MULTIVITAMIN) tablet Take 1 tablet by mouth daily.       pantoprazole  (PROTONIX ) 40 MG tablet Take 1 tablet (40 mg total) by mouth daily. 30 tablet 5   polyethylene glycol (MIRALAX  / GLYCOLAX ) 17 g packet Take 17 g by mouth every evening.     predniSONE  (DELTASONE ) 5 MG tablet Take as directed (Patient taking differently: Take 2.5 mg by mouth daily with breakfast. Take as directed) 60 tablet 1   rosuvastatin  (CRESTOR ) 20 MG tablet TAKE 1 TABLET BY MOUTH DAILY 90 tablet 2   verapamil  (CALAN -SR) 240 MG CR tablet Take 1.5 tablets (360 mg total) by mouth daily with breakfast. 135 tablet 2   No current facility-administered medications for this visit.         Assessment & Plan    Benign hypertensive heart disease without heart failure Assessment: BP is uncontrolled in office BP 121/69 mmHg;. Home readings still elevated to 170's in the mornings Tolerates chlorthalidone  25 mg daily, hydralazine  50 mg bid, metoprolol  succ 100 mg daily, verapamil  360 mg daily well, Denies SOB, palpitation, chest pain, headaches, Some feet swelling, unsure if weather vs medications Reiterated the importance of regular exercise and low salt diet   Plan:  Continue taking chlorthalidone  25 mg daily, hydralazine  50 mg bid, metoprolol  succ 100 mg daily, verapamil  360 mg daily  (divide as below to determine if we can avoid am elevattions) AM - chlorthalidone , verapamil  120 mg, hydralazine  50  mg PM - metoprolol  succ 100 mg, verapamil  240 mg, hydralazine  50 mg Patient to keep record of BP readings with heart rate and report to us  at the next visit If pressure still elevated overall, consider switching metoprolol  succ to carvedilol Patient to follow up with Katlyn in September Labs ordered today:  none   Allean Mink PharmD CPP [facility]  [address] [place], [place] [address] [phone]

## 2024-04-26 ENCOUNTER — Ambulatory Visit
Admission: RE | Admit: 2024-04-26 | Discharge: 2024-04-26 | Disposition: A | Discharge status: Home / Self Care | Source: Ambulatory Visit | Attending: Internal Medicine | Admitting: Internal Medicine

## 2024-04-26 DIAGNOSIS — Z0000 Encounter for general adult medical examination without abnormal findings: Secondary | ICD-10-CM

## 2024-04-30 ENCOUNTER — Other Ambulatory Visit: Payer: Self-pay | Admitting: Internal Medicine

## 2024-05-03 ENCOUNTER — Encounter (HOSPITAL_BASED_OUTPATIENT_CLINIC_OR_DEPARTMENT_OTHER): Payer: Self-pay | Admitting: Adult Health

## 2024-05-03 ENCOUNTER — Ambulatory Visit (HOSPITAL_BASED_OUTPATIENT_CLINIC_OR_DEPARTMENT_OTHER): Admitting: Adult Health

## 2024-05-03 ENCOUNTER — Telehealth (HOSPITAL_COMMUNITY): Payer: Self-pay | Admitting: Pharmacy Technician

## 2024-05-03 VITALS — BP 132/59 | HR 72 | Ht 62.5 in | Wt 171.6 lb

## 2024-05-03 DIAGNOSIS — Z87891 Personal history of nicotine dependence: Secondary | ICD-10-CM

## 2024-05-03 DIAGNOSIS — J9611 Chronic respiratory failure with hypoxia: Secondary | ICD-10-CM

## 2024-05-03 DIAGNOSIS — J8410 Pulmonary fibrosis, unspecified: Secondary | ICD-10-CM

## 2024-05-03 DIAGNOSIS — J9610 Chronic respiratory failure, unspecified whether with hypoxia or hypercapnia: Secondary | ICD-10-CM | POA: Diagnosis not present

## 2024-05-03 DIAGNOSIS — R053 Chronic cough: Secondary | ICD-10-CM

## 2024-05-03 DIAGNOSIS — M069 Rheumatoid arthritis, unspecified: Secondary | ICD-10-CM | POA: Diagnosis not present

## 2024-05-03 DIAGNOSIS — J849 Interstitial pulmonary disease, unspecified: Secondary | ICD-10-CM

## 2024-05-03 NOTE — Progress Notes (Signed)
 @Patient  ID: [name], female    DOB: 19-Aug-1941, 83 y.o.   MRN: [id number]  Chief Complaint  Patient presents with   Follow-up    ILD    Referring provider: Stephane Leita DEL, MD  HPI: 83 year old female minimum smoking history seen March 08, 2024 for pulmonary consult for suspected interstitial lung disease -possible methotrexate induced pulmonary toxicity versus rheumatoid arthritis related ILD  TEST/EVENTS :  Hospitalization May 2025 for acute respiratory failure, abnormal CT chest with groundglass opacities bilaterally-treated with steroid taper, discharged on oxygen.  Methotrexate stopped prior to hospitalization  CT chest Feb 16, 2024 patchy groundglass opacities bilaterally with subpleural reticulation, 3 mm left upper lobe pulmonary nodules  .05/03/2024 Follow up ; ILD  Discussed the use of AI scribe software for clinical note transcription with the patient, who gave verbal consent to proceed.  History of Present Illness [name] is an 83 year old female with rheumatoid arthritis presents for a 31-month follow-up.  Patient was seen last visit for pulmonary consult after recent hospitalization for acute respiratory failure with abnormal CT chest.  Patient presented with acute shortness of breath found to have acute hypoxic respiratory failure.  CT chest showed groundglass opacities bilaterally with subpleural reticulation.  Patient was felt to have possible methotrexate induced lung toxicity.  She also recently been diagnosed with rheumatoid arthritis and suspicion for possible rheumatoid arthritis related ILD.  Methotrexate was stopped prior to hospitalization.  Patient was put on a prolonged prednisone  taper.  Discharged on home oxygen.    Her persistent cough has been present for about four years, initially thought to be due to seasonal allergies and treated with Zyrtec and a prescription nasal spray, neither of which resolved the cough.  She was previously on oral  methotrexate for four months without improvement in symptoms, and subsequent blood tests indicated poor absorption of the medication. She was then switched to subcutaneous methotrexate, but after ten weeks, she experienced significant weakness and difficulty breathing, leading to hospitalization. Methotrexate was discontinued, and she was placed on prednisone .  She has tapered down to 2.5 mg daily  Her cough has improved on prednisone , though she still experiences some coughing. She has been off supplemental oxygen since early June, She monitors her oxygen levels at night, which range between 93 and 98 upon waking.  Walk test today in the office shows no significant desaturations on room air.  Maintain O2 saturations greater than 92% on room air walking  Her past medical history includes a history of asthma as a child, which she outgrew by age [age] or [age], and a history of smoking, minimally in her teenage years.  She has no pets, birds, or exposure to chemicals or fumes at home. . Family history is notable for cousins and a niece with multiple sclerosis. She also reports a history of thyroid  surgery and a right carotid endarterectomy in 1998. She experiences joint swelling associated with rheumatoid arthritis, but no current joint pain, although she had wrist pain that resolved with high doses of cortisone.  Moving next week into [facility].   Allergies  Allergen Reactions   Methotrexate Shortness Of Breath   Leflunomide Other (See Comments)   Zebeta Other (See Comments)    Does not work, per patient.   Ace Inhibitors Other (See Comments)    cough   Hydroxychloroquine Sulfate Rash    Immunization History  Administered Date(s) Administered   Fluad Quad(high Dose 65+) 06/30/2019, 07/14/2020   Influenza Split  07/14/2020   Influenza, High Dose Seasonal PF 07/01/2016, 06/24/2017   Influenza-Unspecified 06/23/2014, 07/28/2018   Moderna Sars-Covid-2 Vaccination 10/04/2019,  11/01/2019, 06/27/2020   Pneumococcal Conjugate-13 07/31/2015   Pneumococcal Polysaccharide-23 02/23/2021   Tdap 06/17/2011    Past Medical History:  Diagnosis Date   Arthritis    Back pain    had cortisone injection 6/15, Dr Anderson   Carotid artery occlusion    Carotid bruit    Colitis, collagenous    Diverticulosis    GERD (gastroesophageal reflux disease)    Hyperlipidemia    Hypertension    Injury of left leg 06/05/12   Pt. fell   Peripheral vascular disease (HCC)    Shingles outbreak 2017   Thyroid  disease 02-27-12   lt. thyroid  nodule, bx. done 5 yrs ago-now some enlargement is seen.    Tobacco History: Social History   Tobacco Use  Smoking Status Former   Current packs/day: 0.00   Types: Cigarettes   Quit date: 09/24/1975   Years since quitting: 48.6  Smokeless Tobacco Never   Counseling given: Not Answered   Outpatient Medications Prior to Visit  Medication Sig Dispense Refill   acetaminophen  (TYLENOL ) 500 MG tablet Take 1,000 mg by mouth 2 (two) times daily.     aspirin  81 MG EC tablet Take 81 mg by mouth daily.     B Complex Vitamins (VITAMIN-B COMPLEX PO) Take 1 tablet by mouth daily with breakfast.     calcium  carbonate (TUMS - DOSED IN MG ELEMENTAL CALCIUM ) 500 MG chewable tablet Chew 1 tablet by mouth daily as needed for indigestion or heartburn.     chlorthalidone  (HYGROTON ) 25 MG tablet Take 1 tablet (25 mg total) by mouth daily. 90 tablet 3   Cholecalciferol (VITAMIN D3) 50 MCG (2000 UT) CAPS Take 2,000 Units by mouth 3 (three) times a week. Tuesdays, Thursdays and Saturdays     denosumab  (PROLIA ) 60 MG/ML SOSY injection Inject 60 mg into the skin every 6 (six) months.     estradiol  (ESTRACE ) 0.1 MG/GM vaginal cream Apply to skin externally nightly for the next 7 days and then decrease to using twice weekly. (Patient taking differently: 1 Applicatorful as needed. Apply to skin externally nightly for the next 7 days and then decrease to using twice  weekly.) 42.5 g 1   folic acid (FOLVITE) 1 MG tablet Take 1 mg by mouth daily.     GEMTESA  75 MG TABS TAKE 1 TABLET BY MOUTH DAILY 90 tablet 4   hydrALAZINE  (APRESOLINE ) 50 MG tablet Take 1 tablet (50 mg total) by mouth in the morning and at bedtime. 180 tablet 3   HYDROcodone -acetaminophen  (NORCO/VICODIN) 5-325 MG tablet Take 1 tablet by mouth every 6 (six) hours as needed.     KRILL OIL PO Take 1 tablet by mouth daily.     levothyroxine  (SYNTHROID ) 112 MCG tablet Take 1 tablet (112 mcg total) by mouth daily. 90 tablet 3   loratadine (CLARITIN) 10 MG tablet Take 10 mg by mouth daily as needed for allergies.     metoprolol  succinate (TOPROL -XL) 100 MG 24 hr tablet TAKE 1 TABLET BY MOUTH DAILY 90 tablet 3   Multiple Vitamin (MULTIVITAMIN) tablet Take 1 tablet by mouth daily.       pantoprazole  (PROTONIX ) 40 MG tablet Take 1 tablet (40 mg total) by mouth daily. 30 tablet 5   polyethylene glycol (MIRALAX  / GLYCOLAX ) 17 g packet Take 17 g by mouth every evening.     predniSONE  (DELTASONE ) 5  MG tablet Take as directed (Patient taking differently: Take 2.5 mg by mouth daily with breakfast. Take as directed) 60 tablet 1   rosuvastatin  (CRESTOR ) 20 MG tablet TAKE 1 TABLET BY MOUTH DAILY 90 tablet 2   verapamil  (CALAN -SR) 240 MG CR tablet Take 1.5 tablets (360 mg total) by mouth daily with breakfast. 135 tablet 2   No facility-administered medications prior to visit.     Review of Systems:   Constitutional:   No  weight loss, night sweats,  Fevers, chills, fatigue, or  lassitude.  HEENT:   No headaches,  Difficulty swallowing,  Tooth/dental problems, or  Sore throat,                No sneezing, itching, ear ache, nasal congestion, post nasal drip,   CV:  No chest pain,  Orthopnea, PND, swelling in lower extremities, anasarca, dizziness, palpitations, syncope.   GI  No heartburn, indigestion, abdominal pain, nausea, vomiting, diarrhea, change in bowel habits, loss of appetite, bloody stools.    Resp:   No wheezing.  No chest wall deformity  Skin: no rash or lesions.  GU: no dysuria, change in color of urine, no urgency or frequency.  No flank pain, no hematuria   MS:  No joint pain or swelling.  No decreased range of motion.  No back pain.    Physical Exam  BP (!) 132/59   Pulse 72   Ht 5' 2.5 (1.588 m)   Wt 171 lb 9.6 oz (77.8 kg)   LMP 09/24/1979   SpO2 95%   BMI 30.89 kg/m   GEN: A/Ox3; pleasant , NAD, well nourished    HEENT:  [place]/AT,   NOSE-clear, THROAT-clear, no lesions, no postnasal drip or exudate noted.   NECK:  Supple w/ fair ROM; no JVD; normal carotid impulses w/o bruits; no thyromegaly or nodules palpated; no lymphadenopathy.    RESP  Clear  P & A; w/o, wheezes/ rales/ or rhonchi. no accessory muscle use, no dullness to percussion  CARD:  RRR, no m/r/g, tr  peripheral edema, pulses intact, no cyanosis or clubbing.  GI:   Soft & nt; nml bowel sounds; no organomegaly or masses detected.   Musco: Warm bil, no deformities or joint swelling noted.   Neuro: alert, no focal deficits noted.    Skin: Warm, no lesions or rashes    Lab Results:    BMET     ProBNP No results found for: PROBNP  Imaging:   Administration History     None           No data to display          No results found for: NITRICOXIDE      Assessment & Plan:   No problem-specific Assessment & Plan notes found for this encounter.   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  Chronic interstitial lung disease possibly related to rheumatoid arthritis. Clinically improved off methotrexate and steroid course. She is on 2.5 mg of prednisone  daily, She is weaning off prednisone  and is working with Rheumatology regarding new maintenance regimen for her RA . She no longer needs supplemental oxygen during the day, but nocturnal hypoxemia due to  scarring is a concern. Discussed risks of low oxygen levels, including stress on the brain and heart, and the importance of assessing nocturnal oxygen levels. Order an overnight oximetry test to assess nocturnal oxygen levels. Discontinue daytime supplemental oxygen use.  Schedule a follow-up visit in three months with a PFT and HRCT scan to assess lung function and scarring.  Coordinate with rheumatologist Dr. Lavell regarding tapering off prednisone  and initiating alternative rheumatoid arthritis treatment.  Chronic cough   Chronic cough has persisted for four years, initially thought to be due to seasonal allergies and treated with Zyrtec and nasal spray without resolution.  The cough has improveds. Reassess cough symptoms during the follow-up visit in three months.  Chronic respiratory failure-walk test in the office shows no exertional hypoxemia.  May continue off of oxygen during the daytime.  Check overnight oximetry test.  If no desaturations can discontinue oxygen.   Plan  Patient Instructions  Set up for overnight oximetry test, if no desaturations we can send order to discontinue Oxygen.  Set up HRCT chest in November Follow up with Dr. Jude in 3 months with PFT and As needed         Maria Stank, NP 05/03/2024

## 2024-05-03 NOTE — Patient Instructions (Addendum)
 Set up for overnight oximetry test, if no desaturations we can send order to discontinue Oxygen.  Set up HRCT chest in November Follow up with Dr. Jude in 3 months with PFT and As needed

## 2024-05-03 NOTE — Telephone Encounter (Signed)
 Auth Submission: APPROVED Site of care: [facility] Payer: [employer] Medicare Medication & CPT/J Code(s) submitted: Prolia  (Denosumab ) [id number] Diagnosis Code:  Route of submission (phone, fax, portal): portal Phone # Fax # Auth type: Buy/Bill HB Units/visits requested: 60mg  x 2 doses, q 6 months Reference number: [id number] Approval from: 05/03/24 to 05/03/25      Dagoberto Armour, CPhT [facility] Phone: [phone] 05/03/2024

## 2024-05-10 ENCOUNTER — Telehealth (HOSPITAL_BASED_OUTPATIENT_CLINIC_OR_DEPARTMENT_OTHER): Payer: Self-pay

## 2024-05-10 NOTE — Telephone Encounter (Signed)
 Routed my last OV note to Dr. Ishmael. She was tapering off steroids (on prednisone  2.5mg  ) . Not sure what else is needed.

## 2024-05-10 NOTE — Telephone Encounter (Signed)
 Copied from CRM #[id number]. Topic: Clinical - Medical Advice >> May 10, 2024 10:02 AM Joesph PARAS wrote: Reason for CRM: Patient is requesting that a letter be written by pulmonologist to Rheumatologist Jon Jacob on [facility] stating that provider wants to taper patient off of prednisone  and would like rheumatologist to manage that. Please draft and send letter. Patient unable to provide contact information for provider.

## 2024-05-17 ENCOUNTER — Ambulatory Visit: Admitting: Orthopedic Surgery

## 2024-05-17 ENCOUNTER — Other Ambulatory Visit (INDEPENDENT_AMBULATORY_CARE_PROVIDER_SITE_OTHER)

## 2024-05-17 DIAGNOSIS — M545 Low back pain, unspecified: Secondary | ICD-10-CM

## 2024-05-17 NOTE — Progress Notes (Signed)
 Orthopedic Spine Surgery Office Note   Assessment: Patient is a 83 y.o. female with L2 compression fracture, pain slowly improving (~5.5 months out from injury)     Plan: -Patient has tried tylenol , advil, tramadol , norco, oral steroids -Activity as tolerated, no spine specific precautions -Their worsening pain from the compression fracture has resolved.  She has had return of her more typical back pain and has responded to injections in the past.  She is interested in a repeat injection.  Referral provided to her today -Patient should return to office in 6 months, x-rays at next visit: AP/lateral lumbar     Patient expressed understanding of the plan and all questions were answered to the patient's satisfaction.    ___________________________________________________________________________     History:   Patient is a 83 y.o. female who presents today for follow-up on her L2 compression fracture.  Patient states that her upper lumbar pain which she felt after her fall has gotten significantly better.  She has returned to her typical more chronic lower back pain.  She has previously gotten injections with Dr. Eldonna.  The last injection she got was at L5 and she got about 80% relief for 10 to 11 months.  She is interested in repeat injections.  She rates the pain as a 8 out of 10 at its worst.  As her upper lumbar pain has gotten better, she has discontinued use of the cane and she is ambulating without assistive devices.   Treatments tried: tylenol , advil, tramadol , norco, oral steroids     Physical Exam:   General: no acute distress, appears stated age Neurologic: alert, answering questions appropriately, following commands Respiratory: unlabored breathing on room air, symmetric chest rise Psychiatric: appropriate affect, normal cadence to speech     MSK (spine):   -Strength exam                                                   Left                  Right EHL                               5/5                  5/5 TA                                 5/5                  5/5 GSC                             5/5                  5/5 Knee extension            5/5                  5/5 Hip flexion                    5/5  5/5   -Sensory exam                           Sensation intact to light touch in L3-S1 nerve distributions of bilateral lower extremities   Imaging: XRs of the lumbar spine from 05/17/2024 were independently reviewed and interpreted, showing L2 compression fracture with unchanged height loss when compared to her films from 03/11/2024.  There is a spondylolisthesis at L4/5.  Disc height loss is seen at multiple levels of the lumbar spine.  No acute fracture or dislocation seen.    Patient name: [name] Patient MRN: [id number] Date of visit: 05/17/24

## 2024-05-26 ENCOUNTER — Encounter: Payer: Self-pay | Admitting: Adult Health

## 2024-05-27 ENCOUNTER — Ambulatory Visit: Payer: Self-pay | Admitting: Adult Health

## 2024-05-30 NOTE — Progress Notes (Unsigned)
 Cardiology Office Note    Date:  05/31/2024  ID:  [name], DOB [birth year], MRN [id number] PCP:  Stephane Leita DEL, MD  Cardiologist:  Candyce Reek, MD  Electrophysiologist:  None   Chief Complaint: Follow-up for hypertension  History of Present Illness: .    [name] is a 83 y.o. female with visit-pertinent history of hypertensive heart disease, hyperlipidemia, carotid artery stenosis and hypothyroidism.  Patient has been managed by vascular for carotid artery stenosis and by her PCP for hypothyroidism, she previously had the left lobe of her thyroid  removed due to a growing nodule which was not cancerous.  She has history of mildly leaky heart valve and mild dilation of the aorta noted on echocardiogram in 2020.  Patient was previously followed by Dr. Reek, she established with Dr. Santo in January, 2025.  At that time patient reported occasional deep breathing at night but denied any shortness of breath.  She remained active participating in [place], [address] and church activities.  Echocardiogram on 10/30/2023 indicated LVEF of 55 to 60%, no RWMA, diastolic parameters were indeterminate, RV systolic function and size was normal, normal PASP, noted to have caseous mitral annular calcification with mild mitral valve regurgitation, mean mitral valve gradient 3.0 mmHg, severe mitral annular calcification.  There was mild calcification of the aortic valve with no evidence of stenosis.  On 02/15/2024 patient's daughter called the after-hours call service reporting that patient had a fall several months prior and broke vertebrae, she did become overall sedentary and was healing slowly.  She was also being given methotrexate injections.  Patient reporting feeling increasingly short of breath over the past several days mostly with exertion, denied chest pain, lightheadedness, syncope, leg edema, rapid weight gain.  On 5/26 patient presented to the ED with worsening shortness of  breath, patient was hyponatremic and hypokalemic, creatinine 1.7 BNP 173 and procalcitonin 0.17, leukocytosis 16.7, anemic at 10.2 with D-dimer elevated at 2.4.  Chest x-ray showed no active disease CT of chest showed patchy groundglass opacity in the lungs bilaterally with subpleural reticulation most prominent in the mid and lower lungs compatible with interstitial lung disease felt to be superimposed component of acute infectious/inflammatory etiology.  She was admitted for acute hypoxic respiratory failure, treated with IV steroids and oxygen.  Patient was referred to pulmonary for follow-up.  Given AKI patient's telmisartan  and chlorthalidone  were held.  She was continued on metoprolol  and verapamil .  On 03/18/2024 patient notified the office of elevated pressures of 175/75, 199/73, 194/80 before taking medications.  Patient reported that she had been on a prednisone  taper since May.  She was she also reported increased leg swelling. Reviewed by Dr. Santo, patient was started on chlorthalidone  25 mg daily.  Patient was seen in clinic on 03/22/2024.  She reported that she had been doing well.  She reported her lower extremity edema had improved however her blood pressure remained elevated in the morning prior to taking her medications, reported that the morning of appointment it was 194/80 however would typically improve after taking morning medications.  She reported chronic and stable dyspnea, denied any specific changes.  Given patient's recent AKI she was not restarted on telmisartan , was started on hydralazine  50 mg twice daily and patient was referred to pharmacy team.  She was seen by Josette Bong, PHM on 04/23/2024.  Is noted that in the afternoon patient's blood pressures were well-controlled however in the morning her blood pressure to be elevated to 170 prior to medications.  She was continued on chlorthalidone  25 mg daily, hydralazine  50 mg twice daily, metoprolol  succinate 100 mg daily  and verapamil  360 mg daily.  Her medications were divided in an effort to avoid a.m. elevations with patient taking chlorthalidone , verapamil  and hydralazine  in the morning, metoprolol , verapamil  to 40 and hydralazine  at night.  Today she presents for follow-up.  She reports that she has been doing very well overall.  She denies any chest pain, significant shortness of breath, orthopnea or PND.  She does report that with the increased dose of hydralazine  she has had increased lower extremity edema that worsens throughout the day and typically resolves overnight.  She denies any significant palpitations, presyncope or syncope.  She reports that her blood pressure has been significantly better controlled at home with blood pressures prior to taking her medications being between 130-140 systolic.  ROS: .   Today she denies chest pain, shortness of breath, lower extremity edema, fatigue, palpitations, melena, hematuria, hemoptysis, diaphoresis, weakness, presyncope, syncope, orthopnea, and PND.  All other systems are reviewed and otherwise negative. Studies Reviewed: SABRA   EKG:  EKG is not ordered today.  CV Studies: Cardiac studies reviewed are outlined and summarized above. Otherwise please see EMR for full report. Cardiac Studies & Procedures   ______________________________________________________________________________________________   STRESS TESTS  MYOCARDIAL PERFUSION IMAGING 03/31/2019  Interpretation Summary  Nuclear stress EF: 77%. The left ventricular ejection fraction is hyperdynamic (>65%).  There was no ST segment deviation noted during stress.  Defect 1: There is a small defect of mild severity present in the basal anterior, mid anterior and apical anterior location. This appears to be most consistent with breast attenuation  This is a low risk study. There is no evidence of ischemia or previous infarction.  The study is normal.   ECHOCARDIOGRAM  ECHOCARDIOGRAM COMPLETE  10/30/2023  Narrative ECHOCARDIOGRAM REPORT    Patient Name:   [name] Date of Exam: 10/30/2023 Medical Rec #:  [id number]       Height:       60.0 in Accession #:    [id number]      Weight:       182.6 lb Date of Birth:  12/17/1940        BSA:          1.796 m Patient Age:    82 years        BP:           153/67 mmHg Patient Gender: F               HR:           73 bpm. Exam Location:  [address]  Procedure: 2D Echo, 3D Echo, Cardiac Doppler, Color Doppler and Strain Analysis  Indications:    I77.810 Aortic root dilation  History:        Patient has prior history of Echocardiogram examinations, most recent 04/02/2019. Carotid Disease and CKD; Risk Factors:HLD.  Sonographer:    Waldo Guadalajara RCS Referring Phys: [id number] MAHESH A CHANDRASEKHAR  IMPRESSIONS   1. Left ventricular ejection fraction, by estimation, is 55 to 60%. Left ventricular ejection fraction by 3D volume is 57 %. The left ventricle has normal function. The left ventricle has no regional wall motion abnormalities. Left ventricular diastolic parameters are indeterminate. The average left ventricular global longitudinal strain is -19.9 %. The global longitudinal strain is normal. 2. Right ventricular systolic function is normal. The right ventricular size is normal. There is normal pulmonary artery  systolic pressure. 3. Caseous mitral annular calcification noted. The mitral valve is degenerative. Mild mitral valve regurgitation. The mean mitral valve gradient is 3.0 mmHg with average heart rate of 72 bpm. Severe mitral annular calcification. 4. The aortic valve is tricuspid. There is mild calcification of the aortic valve. Aortic valve regurgitation is mild. Aortic valve sclerosis is present, with no evidence of aortic valve stenosis. 5. The inferior vena cava is normal in size with greater than 50% respiratory variability, suggesting right atrial pressure of 3 mmHg.  Comparison(s): Prior images reviewed side by  side. Mitral valve calcification has progressed.  FINDINGS Left Ventricle: Left ventricular ejection fraction, by estimation, is 55 to 60%. Left ventricular ejection fraction by 3D volume is 57 %. The left ventricle has normal function. The left ventricle has no regional wall motion abnormalities. The average left ventricular global longitudinal strain is -19.9 %. The global longitudinal strain is normal. The left ventricular internal cavity size was normal in size. There is no left ventricular hypertrophy. Left ventricular diastolic parameters are indeterminate.  Right Ventricle: The right ventricular size is normal. No increase in right ventricular wall thickness. Right ventricular systolic function is normal. There is normal pulmonary artery systolic pressure. The tricuspid regurgitant velocity is 2.34 m/s, and with an assumed right atrial pressure of 3 mmHg, the estimated right ventricular systolic pressure is 24.9 mmHg.  Left Atrium: Left atrial size was normal in size.  Right Atrium: Right atrial size was normal in size.  Pericardium: Trivial pericardial effusion is present. The pericardial effusion is surrounding the apex. Presence of epicardial fat layer.  Mitral Valve: Caseous mitral annular calcification noted. The mitral valve is degenerative in appearance. Severe mitral annular calcification. Mild mitral valve regurgitation. MV peak gradient, 7.7 mmHg. The mean mitral valve gradient is 3.0 mmHg with average heart rate of 72 bpm.  Tricuspid Valve: The tricuspid valve is normal in structure. Tricuspid valve regurgitation is mild . No evidence of tricuspid stenosis.  Aortic Valve: The aortic valve is tricuspid. There is mild calcification of the aortic valve. Aortic valve regurgitation is mild. Aortic regurgitation PHT measures 330 msec. Aortic valve sclerosis is present, with no evidence of aortic valve stenosis.  Pulmonic Valve: The pulmonic valve was normal in structure. Pulmonic  valve regurgitation is trivial. No evidence of pulmonic stenosis.  Aorta: The aortic root and ascending aorta are structurally normal, with no evidence of dilitation.  Venous: The inferior vena cava is normal in size with greater than 50% respiratory variability, suggesting right atrial pressure of 3 mmHg.  IAS/Shunts: The atrial septum is grossly normal.   LEFT VENTRICLE PLAX 2D LVIDd:         4.30 cm         Diastology LVIDs:         3.10 cm         LV e' medial:    6.42 cm/s LV PW:         0.80 cm         LV E/e' medial:  14.4 LV IVS:        0.60 cm         LV e' lateral:   8.49 cm/s LVOT diam:     1.60 cm         LV E/e' lateral: 10.9 LV SV:         53 LV SV Index:   30              2D  LVOT Area:     2.01 cm        Longitudinal Strain 2D Strain GLS  -21.0 % (A2C): 2D Strain GLS  -19.2 % (A3C): 2D Strain GLS  19.5 % (A4C): 2D Strain GLS  -19.9 % Avg:  3D Volume EF LV 3D EF:    Left ventricul ar ejection fraction by 3D volume is 57 %.  3D Volume EF: 3D EF:        57 % LV EDV:       80 ml LV ESV:       35 ml LV SV:        46 ml  RIGHT VENTRICLE RV Basal diam:  3.20 cm RV S prime:     11.40 cm/s TAPSE (M-mode): 1.9 cm RVSP:           24.9 mmHg  LEFT ATRIUM             Index        RIGHT ATRIUM           Index LA diam:        4.00 cm 2.23 cm/m   RA Pressure: 3.00 mmHg LA Vol (A2C):   41.8 ml 23.28 ml/m  RA Area:     9.58 cm LA Vol (A4C):   39.6 ml 22.05 ml/m  RA Volume:   18.60 ml  10.36 ml/m LA Biplane Vol: 41.6 ml 23.17 ml/m AORTIC VALVE LVOT Vmax:   101.00 cm/s LVOT Vmean:  78.200 cm/s LVOT VTI:    0.264 m AI PHT:      330 msec  AORTA Ao Root diam: 2.60 cm Ao Asc diam:  2.60 cm  MITRAL VALVE                TRICUSPID VALVE MV Area (PHT): 3.45 cm     TR Peak grad:   21.9 mmHg MV Area VTI:   1.58 cm     TR Vmax:        234.00 cm/s MV Peak grad:  7.7 mmHg     Estimated RAP:  3.00 mmHg MV Mean grad:  3.0 mmHg     RVSP:           24.9  mmHg MV Vmax:       1.39 m/s MV Vmean:      72.9 cm/s    SHUNTS MV Decel Time: 220 msec     Systemic VTI:  0.26 m MV E velocity: 92.20 cm/s   Systemic Diam: 1.60 cm MV A velocity: 131.00 cm/s MV E/A ratio:  0.70  Stanly Leavens MD Electronically signed by Stanly Leavens MD Signature Date/Time: 10/30/2023/3:17:09 PM    Final          ______________________________________________________________________________________________       Current Reported Medications:.    Current Meds  Medication Sig   acetaminophen  (TYLENOL ) 500 MG tablet Take 1,000 mg by mouth 2 (two) times daily.   aspirin  81 MG EC tablet Take 81 mg by mouth daily.   B Complex Vitamins (VITAMIN-B COMPLEX PO) Take 1 tablet by mouth daily with breakfast.   calcium  carbonate (TUMS - DOSED IN MG ELEMENTAL CALCIUM ) 500 MG chewable tablet Chew 1 tablet by mouth daily as needed for indigestion or heartburn.   chlorthalidone  (HYGROTON ) 25 MG tablet Take 1 tablet (25 mg total) by mouth daily.   Cholecalciferol (VITAMIN D3) 50 MCG (2000 UT) CAPS Take 2,000 Units by mouth 3 (three) times a week. Tuesdays, Thursdays and Saturdays  denosumab  (PROLIA ) 60 MG/ML SOSY injection Inject 60 mg into the skin every 6 (six) months.   folic acid (FOLVITE) 1 MG tablet Take 1 mg by mouth daily.   GEMTESA  75 MG TABS TAKE 1 TABLET BY MOUTH DAILY   hydrALAZINE  (APRESOLINE ) 25 MG tablet Take 1 tablet (25 mg total) by mouth in the morning and at bedtime.   KRILL OIL PO Take 1 tablet by mouth daily.   levothyroxine  (SYNTHROID ) 112 MCG tablet Take 1 tablet (112 mcg total) by mouth daily.   loratadine (CLARITIN) 10 MG tablet Take 10 mg by mouth daily as needed for allergies.   metoprolol  succinate (TOPROL -XL) 100 MG 24 hr tablet TAKE 1 TABLET BY MOUTH DAILY   Multiple Vitamin (MULTIVITAMIN) tablet Take 1 tablet by mouth daily.     pantoprazole  (PROTONIX ) 40 MG tablet Take 1 tablet (40 mg total) by mouth daily.   polyethylene glycol  (MIRALAX  / GLYCOLAX ) 17 g packet Take 17 g by mouth every evening.   rosuvastatin  (CRESTOR ) 20 MG tablet TAKE 1 TABLET BY MOUTH DAILY   verapamil  (CALAN -SR) 240 MG CR tablet Take 1.5 tablets (360 mg total) by mouth daily with breakfast.   [DISCONTINUED] hydrALAZINE  (APRESOLINE ) 50 MG tablet Take 1 tablet (50 mg total) by mouth in the morning and at bedtime.    Physical Exam:    VS:  BP 110/60   Pulse 67   Ht 5' 2 (1.575 m)   Wt 173 lb (78.5 kg)   LMP 09/24/1979   SpO2 95%   BMI 31.64 kg/m    Wt Readings from Last 3 Encounters:  05/31/24 173 lb (78.5 kg)  05/03/24 171 lb 9.6 oz (77.8 kg)  03/22/24 170 lb 6.4 oz (77.3 kg)    GEN: Well nourished, well developed in no acute distress NECK: No JVD; No carotid bruits CARDIAC: RRR, no murmurs, rubs, gallops RESPIRATORY:  Clear to auscultation without rales, wheezing or rhonchi  ABDOMEN: Soft, non-tender, non-distended EXTREMITIES:  +1 ankle edema; No acute deformity     Asessement and Plan:SABRA    Hypertension/lower extremity edema: Initial blood pressure today 104/62, on recheck was 110/60.  Patient notes increased lower extremity edema with initiating hydralazine , given low blood pressure will decrease her hydralazine  to 25 mg twice daily.  Encouraged patient to continue monitoring her blood pressure at home, elevate her legs, decrease salt intake and try compression stockings.  If patient's blood pressure increases with decreased dose of hydralazine  consider transitioning from metoprolol  to carvedilol.  Continue chlorthalidone  25 mg daily, metoprolol  succinate 100 mg daily.  ILD: She reports that her breathing is stable, denies any significant changes.  Patient is currently followed by Dr. Jude.  Severe mitral annular calcification: Noted on echo in 10/30/2023.  She denies any chest pain or changes in her breathing.  Hyperlipidemia: Last lipid profile indicated LDL above goal at 77.  Discussed with patient ideally her LDL goal be less than  70. Patient to have her yearly physical in the next two weeks, if remains above goal recommend increasing Crestor  to 40 mg daily.  Carotid artery stenosis: Monitored managed per vascular specialist.  Hypothyroidism: Patient with history of partial thyroidectomy for noncancerous nodule.  She is followed by her PCP.   Disposition: F/u with Xaniyah Buchholz, NP in 6 weeks.   Signed, Fernande Treiber D Eryn Krejci, NP

## 2024-05-31 ENCOUNTER — Ambulatory Visit: Attending: Cardiology | Admitting: Cardiology

## 2024-05-31 ENCOUNTER — Encounter: Payer: Self-pay | Admitting: Cardiology

## 2024-05-31 VITALS — BP 110/60 | HR 67 | Ht 62.0 in | Wt 173.0 lb

## 2024-05-31 DIAGNOSIS — R600 Localized edema: Secondary | ICD-10-CM | POA: Diagnosis not present

## 2024-05-31 DIAGNOSIS — E782 Mixed hyperlipidemia: Secondary | ICD-10-CM | POA: Diagnosis not present

## 2024-05-31 DIAGNOSIS — I3481 Nonrheumatic mitral (valve) annulus calcification: Secondary | ICD-10-CM

## 2024-05-31 DIAGNOSIS — I119 Hypertensive heart disease without heart failure: Secondary | ICD-10-CM

## 2024-05-31 DIAGNOSIS — I6523 Occlusion and stenosis of bilateral carotid arteries: Secondary | ICD-10-CM

## 2024-05-31 DIAGNOSIS — E890 Postprocedural hypothyroidism: Secondary | ICD-10-CM

## 2024-05-31 MED ORDER — HYDRALAZINE HCL 25 MG PO TABS: 25.0000 mg | ORAL_TABLET | Freq: Two times a day (BID) | ORAL | 3 refills | Status: DC

## 2024-05-31 NOTE — Patient Instructions (Signed)
 Medication Instructions: Decrease Hydralazine  25 mg take one tablet twice daily *If you need a refill on your cardiac medications before your next appointment, please call your pharmacy*   Follow-Up: At [facility], you and your health needs are our priority.  As part of our continuing mission to provide you with exceptional heart care, our providers are all part of one team.  This team includes your primary Cardiologist (physician) and Advanced Practice Providers or APPs (Physician Assistants and Nurse Practitioners) who all work together to provide you with the care you need, when you need it.  Your next appointment:   6 week(s)  Provider:   Katlyn West, NP          We recommend signing up for the patient portal called MyChart.  Sign up information is provided on this After Visit Summary.  MyChart is used to connect with patients for Virtual Visits (Telemedicine).  Patients are able to view lab/test results, encounter notes, upcoming appointments, etc.  Non-urgent messages can be sent to your provider as well.   To learn more about what you can do with MyChart, go to [email or web address].   Other Instructions Monitor blood pressure

## 2024-06-01 ENCOUNTER — Other Ambulatory Visit (HOSPITAL_COMMUNITY): Payer: Self-pay | Admitting: Internal Medicine

## 2024-06-01 DIAGNOSIS — M810 Age-related osteoporosis without current pathological fracture: Secondary | ICD-10-CM | POA: Insufficient documentation

## 2024-06-14 ENCOUNTER — Other Ambulatory Visit: Payer: Self-pay

## 2024-06-14 ENCOUNTER — Ambulatory Visit: Admitting: Physical Medicine and Rehabilitation

## 2024-06-14 VITALS — BP 135/80 | HR 64

## 2024-06-14 DIAGNOSIS — M5416 Radiculopathy, lumbar region: Secondary | ICD-10-CM | POA: Diagnosis not present

## 2024-06-14 MED ORDER — METHYLPREDNISOLONE ACETATE 80 MG/ML IJ SUSP
40.0000 mg | Freq: Once | INTRAMUSCULAR | Status: AC
  Administered 2024-06-14: 40 mg

## 2024-06-14 NOTE — Progress Notes (Signed)
 [name] - 83 y.o. female MRN [id number]  Date of birth: [birth year]  Office Visit Note: Visit Date: 06/14/2024 PCP: Stephane Leita DEL, MD Referred by: Georgina Ozell LABOR, MD  Subjective: Chief Complaint  Patient presents with   Lower Back - Pain   HPI:  [name] is a 83 y.o. female who comes in today for planned repeat Bilateral L5-S1  Lumbar Transforaminal epidural steroid injection with fluoroscopic guidance.  The patient has failed conservative care including home exercise, medications, time and activity modification.  This injection will be diagnostic and hopefully therapeutic.  Please see requesting physician notes for further details and justification. Patient received more than 50% pain relief from prior injection.   Referring: Dr. Ozell Georgina   ROS Otherwise per HPI.  Assessment & Plan: Visit Diagnoses:    ICD-10-CM   1. Lumbar radiculopathy  M54.16 XR C-ARM NO REPORT    Epidural Steroid injection    methylPREDNISolone  acetate (DEPO-MEDROL ) injection 40 mg      Plan: No additional findings.   Meds & Orders:  Meds ordered this encounter  Medications   methylPREDNISolone  acetate (DEPO-MEDROL ) injection 40 mg    Orders Placed This Encounter  Procedures   XR C-ARM NO REPORT   Epidural Steroid injection    Follow-up: Return for visit to requesting provider as needed.   Procedures: No procedures performed  Lumbosacral Transforaminal Epidural Steroid Injection - Sub-Pedicular Approach with Fluoroscopic Guidance  Patient: [name]      Date of Birth: 1941/01/12 MRN: [id number] PCP: Stephane Leita DEL, MD      Visit Date: 06/14/2024   Universal Protocol:    Date/Time: 06/14/2024  Consent Given By: the patient  Position: PRONE  Additional Comments: Vital signs were monitored before and after the procedure. Patient was prepped and draped in the usual sterile fashion. The correct patient, procedure, and site was verified.   Injection Procedure  Details:   Procedure diagnoses: Lumbar radiculopathy [M54.16]    Meds Administered:  Meds ordered this encounter  Medications   methylPREDNISolone  acetate (DEPO-MEDROL ) injection 40 mg    Laterality: Bilateral  Location/Site: L5  Needle:5.0 in., 22 ga.  Short bevel or Quincke spinal needle  Needle Placement: Transforaminal  Findings:    -Comments: Excellent flow of contrast along the nerve, nerve root and into the epidural space.  Procedure Details: After squaring off the end-plates to get a true AP view, the C-arm was positioned so that an oblique view of the foramen as noted above was visualized. The target area is just inferior to the nose of the scotty dog or sub pedicular. The soft tissues overlying this structure were infiltrated with 2-3 ml. of 1% Lidocaine  without Epinephrine.  The spinal needle was inserted toward the target using a trajectory view along the fluoroscope beam.  Under AP and lateral visualization, the needle was advanced so it did not puncture dura and was located close the 6 O'Clock position of the pedical in AP tracterory. Biplanar projections were used to confirm position. Aspiration was confirmed to be negative for CSF and/or blood. A 1-2 ml. volume of Isovue -250 was injected and flow of contrast was noted at each level. Radiographs were obtained for documentation purposes.   After attaining the desired flow of contrast documented above, a 0.5 to 1.0 ml test dose of 0.25% Marcaine  was injected into each respective transforaminal space.  The patient was observed for 90 seconds post injection.  After no sensory deficits were reported, and normal  lower extremity motor function was noted,   the above injectate was administered so that equal amounts of the injectate were placed at each foramen (level) into the transforaminal epidural space.   Additional Comments:  The patient tolerated the procedure well Dressing: 2 x 2 sterile gauze and Band-Aid     Post-procedure details: Patient was observed during the procedure. Post-procedure instructions were reviewed.  Patient left the clinic in stable condition.    Clinical History: EXAM: MRI LUMBAR SPINE WITHOUT CONTRAST   TECHNIQUE: Multiplanar, multisequence MR imaging of the lumbar spine was performed. No intravenous contrast was administered.   COMPARISON:  None.   FINDINGS: Segmentation:  Standard.   Alignment:  Grade 1 anterolisthesis at L4-5   Vertebrae:  No fracture, evidence of discitis, or bone lesion.   Conus medullaris and cauda equina: Conus extends to the L1 level. Conus and cauda equina appear normal.   Paraspinal and other soft tissues: Negative.   Disc levels:   T12-L1: Disc desiccation with minimal bulge.  No stenosis.   L1-L2: Normal disc space and facet joints. No spinal canal stenosis. No neural foraminal stenosis.   L2-L3: Small disc bulge. No spinal canal stenosis. No neural foraminal stenosis.   L3-L4: Intermediate sized left asymmetric disc bulge with mild facet hypertrophy. Left lateral recess narrowing without central spinal canal stenosis. No neural foraminal stenosis.   L4-L5: Severe facet hypertrophy with left asymmetric disc bulge and superimposed right subarticular disc protrusion. Right lateral recess narrowing without central spinal canal stenosis. No neural foraminal stenosis.   L5-S1: Right asymmetric disc bulge. No spinal canal stenosis. Mild right neural foraminal stenosis.   Visualized sacrum: Normal.   IMPRESSION: 1. L4-L5 severe facet arthrosis with grade 1 anterolisthesis. Right lateral recess stenosis could contribute to right L5 radiculopathy. 2. L3-L4 left lateral recess stenosis could contribute to left L4 radiculopathy. 3. L5-S1 mild right neural foraminal stenosis.     Electronically Signed   By: Franky Stanford M.D.   On: 06/22/2021 19:34     Objective:  VS:  HT:    WT:   BMI:     BP:135/80  HR:64bpm   TEMP: ( )  RESP:  Physical Exam Vitals and nursing note reviewed.  Constitutional:      General: She is not in acute distress.    Appearance: Normal appearance. She is not ill-appearing.  HENT:     Head: Normocephalic and atraumatic.     Right Ear: External ear normal.     Left Ear: External ear normal.  Eyes:     Extraocular Movements: Extraocular movements intact.  Cardiovascular:     Rate and Rhythm: Normal rate.     Pulses: Normal pulses.  Pulmonary:     Effort: Pulmonary effort is normal. No respiratory distress.  Abdominal:     General: There is no distension.     Palpations: Abdomen is soft.  Musculoskeletal:        General: Tenderness present.     Cervical back: Neck supple.     Right lower leg: No edema.     Left lower leg: No edema.     Comments: Patient has good distal strength with no pain over the greater trochanters.  No clonus or focal weakness.  Skin:    Findings: No erythema, lesion or rash.  Neurological:     General: No focal deficit present.     Mental Status: She is alert and oriented to person, place, and time.     Sensory:  No sensory deficit.     Motor: No weakness or abnormal muscle tone.     Coordination: Coordination normal.  Psychiatric:        Mood and Affect: Mood normal.        Behavior: Behavior normal.      Imaging: XR C-ARM NO REPORT Result Date: 06/14/2024 Please see Notes tab for imaging impression.

## 2024-06-14 NOTE — Procedures (Signed)
 Lumbosacral Transforaminal Epidural Steroid Injection - Sub-Pedicular Approach with Fluoroscopic Guidance  Patient: [name]      Date of Birth: May 05, 1941 MRN: [id number] PCP: Stephane Leita DEL, MD      Visit Date: 06/14/2024   Universal Protocol:    Date/Time: 06/14/2024  Consent Given By: the patient  Position: PRONE  Additional Comments: Vital signs were monitored before and after the procedure. Patient was prepped and draped in the usual sterile fashion. The correct patient, procedure, and site was verified.   Injection Procedure Details:   Procedure diagnoses: Lumbar radiculopathy [M54.16]    Meds Administered:  Meds ordered this encounter  Medications   methylPREDNISolone  acetate (DEPO-MEDROL ) injection 40 mg    Laterality: Bilateral  Location/Site: L5  Needle:5.0 in., 22 ga.  Short bevel or Quincke spinal needle  Needle Placement: Transforaminal  Findings:    -Comments: Excellent flow of contrast along the nerve, nerve root and into the epidural space.  Procedure Details: After squaring off the end-plates to get a true AP view, the C-arm was positioned so that an oblique view of the foramen as noted above was visualized. The target area is just inferior to the nose of the scotty dog or sub pedicular. The soft tissues overlying this structure were infiltrated with 2-3 ml. of 1% Lidocaine  without Epinephrine.  The spinal needle was inserted toward the target using a trajectory view along the fluoroscope beam.  Under AP and lateral visualization, the needle was advanced so it did not puncture dura and was located close the 6 O'Clock position of the pedical in AP tracterory. Biplanar projections were used to confirm position. Aspiration was confirmed to be negative for CSF and/or blood. A 1-2 ml. volume of Isovue -250 was injected and flow of contrast was noted at each level. Radiographs were obtained for documentation purposes.   After attaining the desired  flow of contrast documented above, a 0.5 to 1.0 ml test dose of 0.25% Marcaine  was injected into each respective transforaminal space.  The patient was observed for 90 seconds post injection.  After no sensory deficits were reported, and normal lower extremity motor function was noted,   the above injectate was administered so that equal amounts of the injectate were placed at each foramen (level) into the transforaminal epidural space.   Additional Comments:  The patient tolerated the procedure well Dressing: 2 x 2 sterile gauze and Band-Aid    Post-procedure details: Patient was observed during the procedure. Post-procedure instructions were reviewed.  Patient left the clinic in stable condition.

## 2024-06-14 NOTE — Progress Notes (Signed)
 Pain Scale   Average Pain 4 Patient advising she has lower back pain that is chronic and advising her pain increases when walking and decrease when sitting..        +Driver, -BT, -Dye Allergies.

## 2024-06-25 ENCOUNTER — Telehealth: Payer: Self-pay

## 2024-06-25 NOTE — Telephone Encounter (Signed)
 Copied from CRM [id number]. Topic: Clinical - Medication Question >> Jun 24, 2024  4:47 PM Rozanna G wrote: Reason for CRM: pt called wnting to know why she is being scheduled a CT and would like a call  Called and spoke with patient,told her it was for her ILD management verbalized understanding.NFN

## 2024-06-29 ENCOUNTER — Ambulatory Visit (INDEPENDENT_AMBULATORY_CARE_PROVIDER_SITE_OTHER): Payer: Self-pay | Admitting: Audiology

## 2024-06-29 DIAGNOSIS — H903 Sensorineural hearing loss, bilateral: Secondary | ICD-10-CM

## 2024-06-29 NOTE — Progress Notes (Signed)
  [address], [address] [place], [place] [address] [phone]  Hearing Aid Check     MIHIKA SURRETTE comes for a scheduled appointment for a hearing aid check.  Accompanied ab:[id number]     Right Left  Hearing aid manufacturer Renaldo Kirschner [id number] SN: [id number] Renaldo Kirschner SHAD SN: [id number]  Hearing aid style Receiver in the canal Receiver in the canal  Hearing aid battery rechargeable rechargeable  Receiver  30M  30M  Dome/ custom earpiece Large closed dome Large closed domes  Retention wire  no  no  Warranty expiration date 08-21-2024 08-21-2024  Loss and Damage unknown unknown  Additional accessories Expiration date      Initial fitting date 10-21-20 08-21-21  Device was fit at: [facility]        Chief complaint: Patient reports cannot hear well with the aids. She now lives at a retirement community center and struggles hearing others when dining.  Actions taken:  The patient initially had this appointment scheduled to send her aids for a check before they went out of warranty.  A left loaner was found in stock and was programmed for her.   Services fee: $0 was paid at checkout.    Recommend: Return for a hearing aid check and hearing test when her hearing aids return form repair. That day we should get the loaner back. Return for a hearing evaluation and to see an ENT, if concerns with hearing changes arise.    Shivaay Stormont MARIE LEROUX-MARTINEZ, AUD

## 2024-07-02 ENCOUNTER — Telehealth (INDEPENDENT_AMBULATORY_CARE_PROVIDER_SITE_OTHER): Payer: Self-pay | Admitting: Otolaryngology

## 2024-07-02 NOTE — Telephone Encounter (Signed)
 Patient's hearing aids have been sent out for repair.  Last hearing eval was done May 2024 and is scheduled to have one done.  Please put in the order for the hearing evaluation so that Rosaline can reprogram the hearing aids once they come back from being repaired.  Thank you!

## 2024-07-05 ENCOUNTER — Other Ambulatory Visit (INDEPENDENT_AMBULATORY_CARE_PROVIDER_SITE_OTHER): Payer: Self-pay | Admitting: Otolaryngology

## 2024-07-05 ENCOUNTER — Ambulatory Visit (HOSPITAL_COMMUNITY)
Admission: RE | Admit: 2024-07-05 | Discharge: 2024-07-05 | Disposition: A | Discharge status: Home / Self Care | Source: Ambulatory Visit | Attending: Internal Medicine | Admitting: Internal Medicine

## 2024-07-05 VITALS — BP 157/58 | HR 78 | Temp 97.6°F | Resp 16

## 2024-07-05 DIAGNOSIS — H903 Sensorineural hearing loss, bilateral: Secondary | ICD-10-CM

## 2024-07-05 DIAGNOSIS — M810 Age-related osteoporosis without current pathological fracture: Secondary | ICD-10-CM | POA: Diagnosis present

## 2024-07-05 MED ORDER — DENOSUMAB 60 MG/ML ~~LOC~~ SOSY
PREFILLED_SYRINGE | SUBCUTANEOUS | Status: AC
  Filled 2024-07-05: qty 1

## 2024-07-05 MED ORDER — DENOSUMAB 60 MG/ML ~~LOC~~ SOSY
60.0000 mg | PREFILLED_SYRINGE | Freq: Once | SUBCUTANEOUS | Status: AC
  Administered 2024-07-05: 60 mg via SUBCUTANEOUS

## 2024-07-08 ENCOUNTER — Ambulatory Visit (HOSPITAL_BASED_OUTPATIENT_CLINIC_OR_DEPARTMENT_OTHER)
Admission: RE | Admit: 2024-07-08 | Discharge: 2024-07-08 | Disposition: A | Discharge status: Home / Self Care | Source: Ambulatory Visit | Attending: Adult Health | Admitting: Adult Health

## 2024-07-08 DIAGNOSIS — J8410 Pulmonary fibrosis, unspecified: Secondary | ICD-10-CM | POA: Insufficient documentation

## 2024-07-09 ENCOUNTER — Ambulatory Visit (HOSPITAL_BASED_OUTPATIENT_CLINIC_OR_DEPARTMENT_OTHER): Payer: Self-pay | Admitting: Adult Health

## 2024-07-09 NOTE — Telephone Encounter (Signed)
 FYI Only or Action Required?: Action required by provider: lab or test result follow-up needed.  Patient is followed in Pulmonology for ILD, last seen on 05/03/2024 by Parrett, Madelin RAMAN, NP.  Called Nurse Triage reporting Results.  Symptoms began n/a.  Interventions attempted: Other: n/a.  Symptoms are: n/a.  Triage Disposition: Call PCP When Office is Open  Patient/caregiver understands and will follow disposition?: Yes  Copied from CRM #[id number]. Topic: Clinical - Lab/Test Results >> Jul 09, 2024  3:19 PM Rozanna G wrote: Reason for CRM: pt calling about ct scan of lungs results and she is concerned and would like to speak with somone warm transferring to NT Reason for Disposition  [1] Caller requesting NON-URGENT health information AND [2] PCP's office is the best resource  Answer Assessment - Initial Assessment Questions 1. REASON FOR CALL: What is the main reason for your call? or How can I best help you?     Patient read her CT results on mychart and would like to speak to Dr. Jude Patient is concerned about the results she read  Attempted to call [facility], office closed  2. SYMPTOMS : Do you have any symptoms?      N/a 3. OTHER QUESTIONS: Do you have any other questions?     none  Protocols used: Information Only Call - No Triage-A-AH

## 2024-07-12 ENCOUNTER — Ambulatory Visit: Payer: Self-pay | Admitting: Adult Health

## 2024-07-12 NOTE — Telephone Encounter (Addendum)
    Copied from CRM #[id number]. Topic: Clinical - Lab/Test Results >> Jul 12, 2024  9:34 AM Devaughn RAMAN wrote: Reason for CRM: Patient is returning [facility] phone call regarding CT chest results, informed pt of results. Patient stated she had additional questions and would like to speak with [facility] regarding the results as she was not expecting her CT to be worst. Patient is concerned regarding diagnosis she read on MyChart. She stated she is upset with the diagnosis. Patient will not be available until after 2:30pm. Contacted [facility] [place] spoke with Debbie   Informed patient about result and scheduled a sooner visit with Dr Jude. chad

## 2024-07-12 NOTE — Telephone Encounter (Signed)
 Please advise CT was ordered by you. Dr Jude is out of the office until Thursday

## 2024-07-12 NOTE — Telephone Encounter (Deleted)
 Copied from CRM #[id number]. Topic: Clinical - Lab/Test Results >> Jul 12, 2024  9:34 AM Devaughn RAMAN wrote: Reason for CRM: Patient is returning [facility] phone call regarding CT chest results, informed pt of results. Patient stated she had additional questions and would like to speak with [facility] regarding the results as she was not expecting her CT to be worst. Patient is concerned regarding diagnosis she read on MyChart. She stated she is upset with the diagnosis. Patient will not be available until after 2:30pm. Contacted [facility] [place] spoke with Marval

## 2024-07-12 NOTE — Telephone Encounter (Signed)
 Patient was call with results on 10/20 at 8:33 am.  CT chest shows ILD changes. Previous patchy inflammation resolved-that is good news Will discuss in full detail at ov with Dr. Jude  next month   CT CHEST HIGH RESOLUTION   Called home # and left message to return call.

## 2024-07-12 NOTE — Telephone Encounter (Signed)
 Results have been relayed.NFN

## 2024-07-12 NOTE — Telephone Encounter (Signed)
 Called and spoke with the pt.  I have re-relayed pts results.  I have scheduled pt to be seen sooner 11/10. Pt is aware of appt with Dr. Jude and will discuss in detail then.  Nothing further needed.

## 2024-07-12 NOTE — Telephone Encounter (Signed)
 Results note was sent please call patient to see what questions she has ?

## 2024-07-12 NOTE — Telephone Encounter (Signed)
 Patient was called earlier to give the results of her CT scan.  She was left a VM to return call.  ATC cell #, Left VM to return call.

## 2024-07-15 ENCOUNTER — Ambulatory Visit (INDEPENDENT_AMBULATORY_CARE_PROVIDER_SITE_OTHER): Admitting: Audiology

## 2024-07-15 DIAGNOSIS — H903 Sensorineural hearing loss, bilateral: Secondary | ICD-10-CM

## 2024-07-15 NOTE — Progress Notes (Signed)
  [address], [address] [place], [place] [address] [phone]  Audiological Evaluation    Name: [name]     DOB:   02-03-1941      MRN:   [id number]                                                                                     Service Date: 07/15/2024     Accompanied by: unaccompanied   Patient comes today after Dr. Karis, ENT sent a referral for a hearing evaluation due to concerns with slowly progressing hearing changes.   Symptoms Yes Details  Hearing loss  [x]  Known hearing loss in both ears. Last audiogram was completed on 01-27-2023 at [facility].  Tinnitus  []    Ear pain/ infections/pressure  []    Balance problems  []    Noise exposure history  []    Previous ear surgeries  []    Family history of hearing loss  []    Amplification  [x]  Has a set of [employer] aids that were fit at [facility].  Other  []      Otoscopy: Right ear: Clear external ear canal and notable landmarks visualized on the tympanic membrane. Left ear:  Clear external ear canal and notable landmarks visualized on the tympanic membrane.  Tympanometry: Right ear: Normal external ear canal volume with normal middle ear pressure and tympanic membrane compliance (Type A). Findings are suggestive of normal middle ear function. Left ear: Normal external ear canal volume with normal middle ear pressure and tympanic membrane compliance (Type A). Findings are suggestive of normal middle ear function.   Hearing Evaluation The hearing test results were completed under headphones and results are deemed to be of good reliability. Test technique:  conventional    Pure tone Audiometry: Right ear- Normal to moderately severe sensorineural hearing loss from 125 Hz - 8000 Hz. Left ear-  Normal to moderately severe sensorineural hearing loss from 125 Hz - 8000 Hz.  Speech Audiometry: Right ear- Speech Reception Threshold (SRT) was obtained at 40 dBHL. Left ear-Speech Reception Threshold (SRT)  was obtained at 40 dBHL.   Word Recognition Score Tested using NU-6 (recorded) Right ear: 84% was obtained at a presentation level of 85 dBHL with contralateral masking which is deemed as  good . Left ear: 76% was obtained at a presentation level of 85 dBHL with contralateral masking which is deemed as  fair.   Impression: The patient is recommended to have her hearing aids reprogrammed given some slight changes in thresholds in both ears, mainly at 2k and 4kHz.   Recommendations: Follow up with ENT as scheduled for today. Return for a hearing evaluation if concerns with hearing changes arise or per MD recommendation.    Priyansh Pry MARIE LEROUX-MARTINEZ, AUD

## 2024-07-19 ENCOUNTER — Ambulatory Visit (INDEPENDENT_AMBULATORY_CARE_PROVIDER_SITE_OTHER): Admitting: Audiology

## 2024-07-19 ENCOUNTER — Ambulatory Visit: Admitting: Cardiology

## 2024-07-19 DIAGNOSIS — H903 Sensorineural hearing loss, bilateral: Secondary | ICD-10-CM

## 2024-07-19 NOTE — Progress Notes (Signed)
  [address] [place], [place] [address] [phone]  Hearing Aid Check     [name] comes for a scheduled appointment for a hearing aid check.    Accompanied ab:[id number]     Right Left  Hearing aid manufacturer Renaldo Kirschner [id number] SN: [id number] Renaldo Kirschner SHAD SN: [id number]  Hearing aid style Receiver in the canal Receiver in the canal  Hearing aid battery rechargeable rechargeable  Receiver  38M  38M  Dome/ custom earpiece Large closed dome Large closed domes  Retention wire  no  no  Warranty expiration date 08-21-2024(?) 08-21-2024(?)  Loss and Damage unknown unknown  Additional accessories Expiration date      Initial fitting date 10-21-20 08-21-21  Device was fit at: [facility]     Chief complaint: Patient comes to pick up her hearing aids after they were sent out for repair before warranty ends .  Actions taken: Programmed hearing aids based on last settings. The hearing aids were then checked with real ear measurements. Reprogrammed hearing aids to meet prescription targets (NAL-NL2) based on last hearing test from July 15, 2024. Could not transfer results due to IT limitations. Patient said she liked the changes  because she no longer felt under water. She may return in one months if she notices needs a little more amplification.  Patient was informed that the warranty date listed in the hearing aid purchase agreement (08/21/2024) is incorrect. The actual warranty expired earlier this year due to an error made during the agreement process at [facility].  The patient expressed that she believed her hearing aids were still under warranty and that during her visit in May to see Dr. Karis, she expected both a hearing test and a hearing aid check. She was asked to schedule the appointments separately due to [facility] staffing changes at the time, with only one audiologist available.  I also shared that I am  working with [employer] to reduce the repair charges. So far, [employer] has agreed to cover 50% of the cost. The patient understands that if no further resolution is reached, she may receive a bill for the remaining charges.  To honor the terms of the signed agreement, I will continue to waive service fees through 08/21/2024.  Services fee: $0 was paid at checkout.  Patient was oriented about how to change the wax filters because now they use the cerustop (stick) instead of the cerudisk. Additional domes were provided.  Recommend: Return for a hearing aid check , as needed. Return for a hearing evaluation and to see an ENT, if concerns with hearing changes arise.    Daryana Whirley MARIE LEROUX-MARTINEZ, AUD

## 2024-07-19 NOTE — Progress Notes (Unsigned)
 Cardiology Office Note    Date:  07/21/2024  ID:  [name], DOB 04/28/1941, MRN [id number] PCP:  Stephane Leita DEL, MD  Cardiologist:  Candyce Reek, MD  Electrophysiologist:  None   Chief Complaint: Follow up for hypertension   History of Present Illness: .    [name] is a 83 y.o. female with visit-pertinent history of hypertensive heart disease, hyperlipidemia, carotid artery stenosis and hypothyroidism.  Patient has been managed by vascular for carotid artery stenosis and by her PCP for hypothyroidism, she previously had the left lobe of her thyroid  removed due to a growing nodule which was not cancerous.   She has history of mildly leaky heart valve and mild dilation of the aorta noted on echocardiogram in 2020.  Patient was previously followed by Dr. Reek, she established with Dr. Santo in January, 2025.  At that time patient reported occasional deep breathing at night but denied any shortness of breath.  She remained active participating in [place], [address] and church activities.  Echocardiogram on 10/30/2023 indicated LVEF of 55 to 60%, no RWMA, diastolic parameters were indeterminate, RV systolic function and size was normal, normal PASP, noted to have caseous mitral annular calcification with mild mitral valve regurgitation, mean mitral valve gradient 3.0 mmHg, severe mitral annular calcification.  There was mild calcification of the aortic valve with no evidence of stenosis.   On 02/15/2024 patient's daughter called the after-hours call service reporting that patient had a fall several months prior and broke vertebrae, she did become overall sedentary and was healing slowly.  She was also being given methotrexate injections.  Patient reporting feeling increasingly short of breath over the past several days mostly with exertion, denied chest pain, lightheadedness, syncope, leg edema, rapid weight gain.  On 5/26 patient presented to the ED with worsening shortness of  breath, patient was hyponatremic and hypokalemic, creatinine 1.7 BNP 173 and procalcitonin 0.17, leukocytosis 16.7, anemic at 10.2 with D-dimer elevated at 2.4.  Chest x-ray showed no active disease CT of chest showed patchy groundglass opacity in the lungs bilaterally with subpleural reticulation most prominent in the mid and lower lungs compatible with interstitial lung disease felt to be superimposed component of acute infectious/inflammatory etiology.  She was admitted for acute hypoxic respiratory failure, treated with IV steroids and oxygen.  Patient was referred to pulmonary for follow-up.  Given AKI patient's telmisartan  and chlorthalidone  were held.  She was continued on metoprolol  and verapamil .   On 03/18/2024 patient notified the office of elevated pressures of 175/75, 199/73, 194/80 before taking medications.  Patient reported that she had been on a prednisone  taper since May.  She was she also reported increased leg swelling. Reviewed by Dr. Santo, patient was started on chlorthalidone  25 mg daily.   Patient was seen in clinic on 03/22/2024.  She reported that she had been doing well.  She reported her lower extremity edema had improved however her blood pressure remained elevated in the morning prior to taking her medications, reported that the morning of appointment it was 194/80 however would typically improve after taking morning medications.  She reported chronic and stable dyspnea, denied any specific changes.  Given patient's recent AKI she was not restarted on telmisartan , was started on hydralazine  50 mg twice daily and patient was referred to pharmacy team.  She was seen by Josette Bong, PHM on 04/23/2024.  Is noted that in the afternoon patient's blood pressures were well-controlled however in the morning her blood pressure to be  elevated to 170 prior to medications.  She was continued on chlorthalidone  25 mg daily, hydralazine  50 mg twice daily, metoprolol  succinate 100 mg daily  and verapamil  360 mg daily.  Her medications were divided in an effort to avoid a.m. elevations with patient taking chlorthalidone , verapamil  and hydralazine  in the morning, metoprolol , verapamil  to 40 and hydralazine  at night.   Patient was last seen in clinic on 05/31/2024 for follow-up.  She reports she been doing well overall.  She denied any chest pain, significant shortness of breath, orthopnea or PND.  Patient's initial blood pressure was 104/62, on recheck 110/60, patient had noted increased lower extremity edema with initiation of hydralazine , this was decreased to 25 mg twice daily as she also noted increased lower extremity edema.  Today she presents for follow-up.  She reports that she has been doing well, notes that her blood pressure has elevated at home with decreasing of hydralazine  however notes that her lower extremity edema has resolved.  She reports that her systolic blood pressure at home is averaging in the 150s, notes that typically her diastolic is between 60-70.  She denies any chest pain, worsening shortness of breath, lower extremity edema, orthopnea or PND.  She denies any palpitations, presyncope or syncope. ROS: .   Today she denies chest pain, shortness of breath, fatigue, palpitations, melena, hematuria, hemoptysis, diaphoresis, weakness, presyncope, syncope, orthopnea, and PND.  All other systems are reviewed and otherwise negative. Studies Reviewed: [name]   EKG:  EKG is not ordered today.  CV Studies: Cardiac studies reviewed are outlined and summarized above. Otherwise please see EMR for full report. Cardiac Studies & Procedures   ______________________________________________________________________________________________   STRESS TESTS  MYOCARDIAL PERFUSION IMAGING 03/31/2019  Interpretation Summary  Nuclear stress EF: 77%. The left ventricular ejection fraction is hyperdynamic (>65%).  There was no ST segment deviation noted during stress.  Defect 1: There is a  small defect of mild severity present in the basal anterior, mid anterior and apical anterior location. This appears to be most consistent with breast attenuation  This is a low risk study. There is no evidence of ischemia or previous infarction.  The study is normal.   ECHOCARDIOGRAM  ECHOCARDIOGRAM COMPLETE 10/30/2023  Narrative ECHOCARDIOGRAM REPORT    Patient Name:   KIERRIA FEIGENBAUM Date of Exam: 10/30/2023 Medical Rec #:  [id number]       Height:       60.0 in Accession #:    [id number]      Weight:       182.6 lb Date of Birth:  01-13-1941        BSA:          1.796 m Patient Age:    82 years        BP:           153/67 mmHg Patient Gender: F               HR:           73 bpm. Exam Location:  [address]  Procedure: 2D Echo, 3D Echo, Cardiac Doppler, Color Doppler and Strain Analysis  Indications:    I77.810 Aortic root dilation  History:        Patient has prior history of Echocardiogram examinations, most recent 04/02/2019. Carotid Disease and CKD; Risk Factors:HLD.  Sonographer:    Waldo Guadalajara RCS Referring Phys: [id number] MAHESH A CHANDRASEKHAR  IMPRESSIONS   1. Left ventricular ejection fraction, by estimation, is 55 to 60%.  Left ventricular ejection fraction by 3D volume is 57 %. The left ventricle has normal function. The left ventricle has no regional wall motion abnormalities. Left ventricular diastolic parameters are indeterminate. The average left ventricular global longitudinal strain is -19.9 %. The global longitudinal strain is normal. 2. Right ventricular systolic function is normal. The right ventricular size is normal. There is normal pulmonary artery systolic pressure. 3. Caseous mitral annular calcification noted. The mitral valve is degenerative. Mild mitral valve regurgitation. The mean mitral valve gradient is 3.0 mmHg with average heart rate of 72 bpm. Severe mitral annular calcification. 4. The aortic valve is tricuspid. There is mild calcification of  the aortic valve. Aortic valve regurgitation is mild. Aortic valve sclerosis is present, with no evidence of aortic valve stenosis. 5. The inferior vena cava is normal in size with greater than 50% respiratory variability, suggesting right atrial pressure of 3 mmHg.  Comparison(s): Prior images reviewed side by side. Mitral valve calcification has progressed.  FINDINGS Left Ventricle: Left ventricular ejection fraction, by estimation, is 55 to 60%. Left ventricular ejection fraction by 3D volume is 57 %. The left ventricle has normal function. The left ventricle has no regional wall motion abnormalities. The average left ventricular global longitudinal strain is -19.9 %. The global longitudinal strain is normal. The left ventricular internal cavity size was normal in size. There is no left ventricular hypertrophy. Left ventricular diastolic parameters are indeterminate.  Right Ventricle: The right ventricular size is normal. No increase in right ventricular wall thickness. Right ventricular systolic function is normal. There is normal pulmonary artery systolic pressure. The tricuspid regurgitant velocity is 2.34 m/s, and with an assumed right atrial pressure of 3 mmHg, the estimated right ventricular systolic pressure is 24.9 mmHg.  Left Atrium: Left atrial size was normal in size.  Right Atrium: Right atrial size was normal in size.  Pericardium: Trivial pericardial effusion is present. The pericardial effusion is surrounding the apex. Presence of epicardial fat layer.  Mitral Valve: Caseous mitral annular calcification noted. The mitral valve is degenerative in appearance. Severe mitral annular calcification. Mild mitral valve regurgitation. MV peak gradient, 7.7 mmHg. The mean mitral valve gradient is 3.0 mmHg with average heart rate of 72 bpm.  Tricuspid Valve: The tricuspid valve is normal in structure. Tricuspid valve regurgitation is mild . No evidence of tricuspid stenosis.  Aortic  Valve: The aortic valve is tricuspid. There is mild calcification of the aortic valve. Aortic valve regurgitation is mild. Aortic regurgitation PHT measures 330 msec. Aortic valve sclerosis is present, with no evidence of aortic valve stenosis.  Pulmonic Valve: The pulmonic valve was normal in structure. Pulmonic valve regurgitation is trivial. No evidence of pulmonic stenosis.  Aorta: The aortic root and ascending aorta are structurally normal, with no evidence of dilitation.  Venous: The inferior vena cava is normal in size with greater than 50% respiratory variability, suggesting right atrial pressure of 3 mmHg.  IAS/Shunts: The atrial septum is grossly normal.   LEFT VENTRICLE PLAX 2D LVIDd:         4.30 cm         Diastology LVIDs:         3.10 cm         LV e' medial:    6.42 cm/s LV PW:         0.80 cm         LV E/e' medial:  14.4 LV IVS:        0.60 cm  LV e' lateral:   8.49 cm/s LVOT diam:     1.60 cm         LV E/e' lateral: 10.9 LV SV:         53 LV SV Index:   30              2D LVOT Area:     2.01 cm        Longitudinal Strain 2D Strain GLS  -21.0 % (A2C): 2D Strain GLS  -19.2 % (A3C): 2D Strain GLS  19.5 % (A4C): 2D Strain GLS  -19.9 % Avg:  3D Volume EF LV 3D EF:    Left ventricul ar ejection fraction by 3D volume is 57 %.  3D Volume EF: 3D EF:        57 % LV EDV:       80 ml LV ESV:       35 ml LV SV:        46 ml  RIGHT VENTRICLE RV Basal diam:  3.20 cm RV S prime:     11.40 cm/s TAPSE (M-mode): 1.9 cm RVSP:           24.9 mmHg  LEFT ATRIUM             Index        RIGHT ATRIUM           Index LA diam:        4.00 cm 2.23 cm/m   RA Pressure: 3.00 mmHg LA Vol (A2C):   41.8 ml 23.28 ml/m  RA Area:     9.58 cm LA Vol (A4C):   39.6 ml 22.05 ml/m  RA Volume:   18.60 ml  10.36 ml/m LA Biplane Vol: 41.6 ml 23.17 ml/m AORTIC VALVE LVOT Vmax:   101.00 cm/s LVOT Vmean:  78.200 cm/s LVOT VTI:    0.264 m AI PHT:      330  msec  AORTA Ao Root diam: 2.60 cm Ao Asc diam:  2.60 cm  MITRAL VALVE                TRICUSPID VALVE MV Area (PHT): 3.45 cm     TR Peak grad:   21.9 mmHg MV Area VTI:   1.58 cm     TR Vmax:        234.00 cm/s MV Peak grad:  7.7 mmHg     Estimated RAP:  3.00 mmHg MV Mean grad:  3.0 mmHg     RVSP:           24.9 mmHg MV Vmax:       1.39 m/s MV Vmean:      72.9 cm/s    SHUNTS MV Decel Time: 220 msec     Systemic VTI:  0.26 m MV E velocity: 92.20 cm/s   Systemic Diam: 1.60 cm MV A velocity: 131.00 cm/s MV E/A ratio:  0.70  Stanly Leavens MD Electronically signed by Stanly Leavens MD Signature Date/Time: 10/30/2023/3:17:09 PM    Final          ______________________________________________________________________________________________       Current Reported Medications:.    Current Meds  Medication Sig   acetaminophen  (TYLENOL ) 500 MG tablet Take 1,000 mg by mouth 2 (two) times daily.   aspirin  81 MG EC tablet Take 81 mg by mouth daily.   B Complex Vitamins (VITAMIN-B COMPLEX PO) Take 1 tablet by mouth daily with breakfast.   calcium  carbonate (TUMS - DOSED IN MG  ELEMENTAL CALCIUM ) 500 MG chewable tablet Chew 1 tablet by mouth daily as needed for indigestion or heartburn.   carvedilol (COREG) 12.5 MG tablet Take 1 tablet (12.5 mg total) by mouth 2 (two) times daily.   chlorthalidone  (HYGROTON ) 25 MG tablet Take 1 tablet (25 mg total) by mouth daily.   Cholecalciferol (VITAMIN D3) 50 MCG (2000 UT) CAPS Take 2,000 Units by mouth 3 (three) times a week. Tuesdays, Thursdays and Saturdays   denosumab  (PROLIA ) 60 MG/ML SOSY injection Inject 60 mg into the skin every 6 (six) months.   folic acid (FOLVITE) 1 MG tablet Take 1 mg by mouth daily.   GEMTESA  75 MG TABS TAKE 1 TABLET BY MOUTH DAILY   hydrALAZINE  (APRESOLINE ) 25 MG tablet Take 1 tablet (25 mg total) by mouth in the morning and at bedtime.   KRILL OIL PO Take 1 tablet by mouth daily.   levothyroxine   (SYNTHROID ) 112 MCG tablet Take 1 tablet (112 mcg total) by mouth daily.   loratadine (CLARITIN) 10 MG tablet Take 10 mg by mouth daily as needed for allergies.   Multiple Vitamin (MULTIVITAMIN) tablet Take 1 tablet by mouth daily.     pantoprazole  (PROTONIX ) 40 MG tablet Take 1 tablet (40 mg total) by mouth daily.   polyethylene glycol (MIRALAX  / GLYCOLAX ) 17 g packet Take 17 g by mouth every evening.   rosuvastatin  (CRESTOR ) 20 MG tablet TAKE 1 TABLET BY MOUTH DAILY   verapamil  (CALAN -SR) 240 MG CR tablet Take 1.5 tablets (360 mg total) by mouth daily with breakfast.   [DISCONTINUED] metoprolol  succinate (TOPROL -XL) 100 MG 24 hr tablet TAKE 1 TABLET BY MOUTH DAILY    Physical Exam:    VS:  BP (!) 150/76   Pulse 69   Resp 16   Ht 5' 2 (1.575 m)   Wt 176 lb 9.6 oz (80.1 kg)   LMP 09/24/1979   SpO2 96%   BMI 32.30 kg/m    Wt Readings from Last 3 Encounters:  07/20/24 176 lb 9.6 oz (80.1 kg)  05/31/24 173 lb (78.5 kg)  05/03/24 171 lb 9.6 oz (77.8 kg)    GEN: Well nourished, well developed in no acute distress NECK: No JVD; No carotid bruits CARDIAC: RRR, no murmurs, rubs, gallops RESPIRATORY:  Clear to auscultation without rales, wheezing or rhonchi  ABDOMEN: Soft, non-tender, non-distended EXTREMITIES:  No edema; No acute deformity     Asessement and Plan:[name]   Hypertension/lower extremity edema: Blood pressure today 152/78.  She reports that her home blood pressures have been elevated around 150/60 in the morning and in the systolics 140s in the afternoons with decreasing of hydralazine .  However she notes that her lower extremity edema has significantly improved with discontinuation of hydralazine .  Will have patient stop metoprolol  and start carvedilol 12.5 mg twice daily.  Encouraged patient to continue with blood pressure monitoring at home.  Continue chlorthalidone  25 mg daily, hydralazine  25 mg twice daily, verapamil  120 mg in the morning and 240 mg in the afternoon.  ILD:  She reports that her breathing has been stable.  She is followed by Dr. Jude.   Severe mitral annular calcification: Noted on echo in 10/30/2023.  She denies any chest pain or changes in her breathing.   Hyperlipidemia: On rosuvastatin  20 mg daily.  Carotid artery stenosis: Monitored managed per vascular specialist.  Hypothyroidism: Patient with history of partial thyroidectomy for noncancerous nodule.  She is followed by her PCP.   Disposition: F/u with Latika Kronick, NP  in four weeks.   Signed, Raynesha Tiedt D Ivan Lacher, NP

## 2024-07-20 ENCOUNTER — Ambulatory Visit: Attending: Cardiology | Admitting: Cardiology

## 2024-07-20 ENCOUNTER — Encounter: Payer: Self-pay | Admitting: Cardiology

## 2024-07-20 VITALS — BP 150/76 | HR 69 | Resp 16 | Ht 62.0 in | Wt 176.6 lb

## 2024-07-20 DIAGNOSIS — E782 Mixed hyperlipidemia: Secondary | ICD-10-CM

## 2024-07-20 DIAGNOSIS — E890 Postprocedural hypothyroidism: Secondary | ICD-10-CM

## 2024-07-20 DIAGNOSIS — I119 Hypertensive heart disease without heart failure: Secondary | ICD-10-CM | POA: Diagnosis not present

## 2024-07-20 DIAGNOSIS — I6523 Occlusion and stenosis of bilateral carotid arteries: Secondary | ICD-10-CM

## 2024-07-20 DIAGNOSIS — R600 Localized edema: Secondary | ICD-10-CM

## 2024-07-20 DIAGNOSIS — I3481 Nonrheumatic mitral (valve) annulus calcification: Secondary | ICD-10-CM | POA: Diagnosis not present

## 2024-07-20 MED ORDER — CARVEDILOL 12.5 MG PO TABS: 12.5000 mg | ORAL_TABLET | Freq: Two times a day (BID) | ORAL | 3 refills | Status: AC

## 2024-07-20 NOTE — Patient Instructions (Addendum)
 Medication Instructions:  Stop Metoprolol  succinate 100 mg daily Start Carvedilol 12.5 mg twice daily   *If you need a refill on your cardiac medications before your next appointment, please call your pharmacy*  Lab Work: NONE ordered at this time of appointment   Testing/Procedures: NONE ordered at this time of appointment   Follow-Up: At [facility], you and your health needs are our priority.  As part of our continuing mission to provide you with exceptional heart care, our providers are all part of one team.  This team includes your primary Cardiologist (physician) and Advanced Practice Providers or APPs (Physician Assistants and Nurse Practitioners) who all work together to provide you with the care you need, when you need it.  Your next appointment:   4 week(s)  Provider:   Katlyn West, NP          We recommend signing up for the patient portal called MyChart.  Sign up information is provided on this After Visit Summary.  MyChart is used to connect with patients for Virtual Visits (Telemedicine).  Patients are able to view lab/test results, encounter notes, upcoming appointments, etc.  Non-urgent messages can be sent to your provider as well.   To learn more about what you can do with MyChart, go to [email or web address].

## 2024-07-21 ENCOUNTER — Encounter: Payer: Self-pay | Admitting: Cardiology

## 2024-07-21 ENCOUNTER — Encounter: Payer: Self-pay | Admitting: Audiology

## 2024-07-22 ENCOUNTER — Telehealth (INDEPENDENT_AMBULATORY_CARE_PROVIDER_SITE_OTHER): Payer: Self-pay

## 2024-07-22 NOTE — Telephone Encounter (Signed)
 Pt called stating the the Left hearing aid is not fitting inside her charger anymore and she would like a call back.

## 2024-07-23 ENCOUNTER — Telehealth (INDEPENDENT_AMBULATORY_CARE_PROVIDER_SITE_OTHER): Payer: Self-pay | Admitting: Audiology

## 2024-07-23 NOTE — Telephone Encounter (Signed)
 Patient called and left voicemail message stating that her left hearing aid does not fit in the charger.  I called the patient to get a little more information.  [name] stated that since she received the hearing aid back from being repaired on Monday, it does not go all the way down in the charger to make connection to charge.  Last night, she stated that she put a piece of tape across the hearing aid to hold it down in the charger and that seemed to work.  The right hearing aid is charging ok.  Ms. Couvillon stated she has had the same charger for 3+ years.  Please advise.

## 2024-07-26 ENCOUNTER — Encounter: Payer: Self-pay | Admitting: Radiology

## 2024-07-26 ENCOUNTER — Ambulatory Visit (INDEPENDENT_AMBULATORY_CARE_PROVIDER_SITE_OTHER): Payer: Self-pay | Admitting: Audiology

## 2024-07-26 DIAGNOSIS — H903 Sensorineural hearing loss, bilateral: Secondary | ICD-10-CM

## 2024-07-26 NOTE — Progress Notes (Addendum)
  [address] [place], [place] [address] [phone]  Hearing Aid Check     [name] comes for a scheduled appointment for a hearing aid check.    Accompanied ab:[id number]    Right Left  Hearing aid manufacturer Renaldo Kirschner [id number] SN: [id number] Renaldo Kirschner SHAD SN: [id number]  Hearing aid style Receiver in the canal Receiver in the canal  Hearing aid battery rechargeable rechargeable  Receiver  5M  5M  Dome/ custom earpiece Large closed dome Large closed domes  Retention wire  no  no  Warranty expiration date 08-21-2024(?) 08-21-2024(?)  Loss and Damage unknown unknown  Additional accessories Expiration date      Initial fitting date 10-21-20 08-21-21  Device was fit at: [facility]    Chief complaint: Patient reports the left aid does not seem to stay in the charger since it came back from repair.  Actions taken: Noticed that the left hearing aid does not stay securely in the patients charger (ease charge) . It appears there is insufficient magnetic attraction to hold it in place Cleaned contacts without improvement.  The hearing aids seem to charge well in one of my chargers.  Called [facility] department and they will send an loss adjuster, chartered as a courtesy. If the issue persists we will have to send the left aid.    Services fee: $0 was paid at checkout. $0 will be due at pick up.   Recommend: Return for a hearing aid check , as needed. Return for a hearing evaluation and to see an ENT, if concerns with hearing changes arise.    [name] MARIE LEROUX-MARTINEZ, AUD

## 2024-07-27 ENCOUNTER — Encounter

## 2024-08-02 ENCOUNTER — Ambulatory Visit (HOSPITAL_BASED_OUTPATIENT_CLINIC_OR_DEPARTMENT_OTHER): Admitting: Pulmonary Disease

## 2024-08-02 ENCOUNTER — Ambulatory Visit (INDEPENDENT_AMBULATORY_CARE_PROVIDER_SITE_OTHER)

## 2024-08-02 ENCOUNTER — Encounter (HOSPITAL_BASED_OUTPATIENT_CLINIC_OR_DEPARTMENT_OTHER): Payer: Self-pay | Admitting: Pulmonary Disease

## 2024-08-02 VITALS — BP 138/79 | HR 72 | Ht 62.0 in | Wt 179.1 lb

## 2024-08-02 DIAGNOSIS — J9611 Chronic respiratory failure with hypoxia: Secondary | ICD-10-CM | POA: Diagnosis not present

## 2024-08-02 DIAGNOSIS — M069 Rheumatoid arthritis, unspecified: Secondary | ICD-10-CM | POA: Diagnosis not present

## 2024-08-02 DIAGNOSIS — J849 Interstitial pulmonary disease, unspecified: Secondary | ICD-10-CM

## 2024-08-02 DIAGNOSIS — J8410 Pulmonary fibrosis, unspecified: Secondary | ICD-10-CM | POA: Diagnosis not present

## 2024-08-02 LAB — PULMONARY FUNCTION TEST
DL/VA % pred: 64 %
DL/VA: 2.64 ml/min/mmHg/L
DLCO unc % pred: 48 %
DLCO unc: 8.44 ml/min/mmHg
FEF 25-75 Post: 2.33 L/s
FEF 25-75 Pre: 2.37 L/s
FEF2575-%Change-Post: -1 %
FEF2575-%Pred-Post: 224 %
FEF2575-%Pred-Pre: 228 %
FEV1-%Change-Post: 2 %
FEV1-%Pred-Post: 120 %
FEV1-%Pred-Pre: 117 %
FEV1-Post: 1.9 L
FEV1-Pre: 1.86 L
FEV1FVC-%Change-Post: 3 %
FEV1FVC-%Pred-Pre: 115 %
FEV6-%Change-Post: -1 %
FEV6-%Pred-Post: 108 %
FEV6-%Pred-Pre: 110 %
FEV6-Post: 2.19 L
FEV6-Pre: 2.21 L
FEV6FVC-%Pred-Post: 106 %
FEV6FVC-%Pred-Pre: 106 %
FVC-%Change-Post: -1 %
FVC-%Pred-Post: 102 %
FVC-%Pred-Pre: 103 %
FVC-Post: 2.19 L
FVC-Pre: 2.22 L
Post FEV1/FVC ratio: 87 %
Post FEV6/FVC ratio: 100 %
Pre FEV1/FVC ratio: 84 %
Pre FEV6/FVC Ratio: 100 %
RV % pred: 67 %
RV: 1.62 L
TLC % pred: 79 %
TLC: 3.76 L

## 2024-08-02 NOTE — Progress Notes (Signed)
 Full PFT performed today.

## 2024-08-02 NOTE — Patient Instructions (Signed)
 Full PFT performed today.

## 2024-08-02 NOTE — Patient Instructions (Signed)
  VISIT SUMMARY: Today, we reviewed your recent high-resolution CT scan and pulmonary function tests. The scan shows that the ground glass infiltrates in your lungs have resolved, but there is still some scarring. Your lung volumes are normal, but the ability of your lungs to transfer oxygen is decreased. We also discussed your rheumatoid arthritis and its potential impact on your lung condition.  YOUR PLAN: -PULMONARY FIBROSIS WITH RESIDUAL LUNG SCARRING: Pulmonary fibrosis is a condition where the lung tissue becomes scarred and stiff, making it difficult to breathe. Your CT scan shows that while some issues have resolved, scarring remains. We will start the approval process for a new medication to help manage this condition. You should avoid sun exposure and use sunscreen if you start this medication. We will monitor your liver function monthly for the first three months, then every three months after that. We will re-evaluate your symptoms in three months.  -RHEUMATOID ARTHRITIS: Rheumatoid arthritis is an autoimmune condition that causes joint swelling and can also affect other organs, including the lungs. Your joint swelling has improved with cortisone treatment, . We have informed your rheumatologist about your current status and treatment plan.   INSTRUCTIONS: We will re-evaluate your symptoms and response to the new medications in three months. Please ensure you attend all scheduled liver function tests and avoid sun exposure if you start the fibrosis medication.                      Contains text generated by Abridge.                                 Contains text generated by Abridge.

## 2024-08-02 NOTE — Progress Notes (Signed)
 Subjective:    Patient ID: [name], female    DOB: [birth year], 83 y.o.   MRN: [id number]   83 year old retired [name] presented with shortness of breath for 3 weeks & chronic cough x 4 y.  She was noted to be hypoxic to 70% on room air.  Initial labs significant for leukocytosis 16.7, slight high procalcitonin 0.17, hemoglobin 10.3, D-dimer 2.4. COVID flu RSV was negative. CT chest without contrast showed patchy groundglass opacity bilateral lungs with subpleural reticulation mostly in the mid and lower lung fields  Initial impression >> ILD - related to methotrexate (onset correlates with starting this medication] however it could be related to rheumatoid arthritis itself -chronic cough for 4 years and previous chest x-ray showing some coarse interstitium makes this possible.    04/2024 OV >>Walk test today in the office shows no significant desaturations on room air.   Discussed the use of AI scribe software for clinical note transcription with the patient, who gave verbal consent to proceed.  History of Present Illness [name] is an 83 year old female with interstitial lung disease who presents for follow-up.  Her high-resolution CT scan shows resolution of ground glass infiltrates, with a pattern consistent with usual interstitial pneumonia or fibrotic hypersensitivity pneumonitis. Lung scarring persists without noticeable changes in breathing. Pulmonary function tests reveal normal lung volumes but decreased diffusion capacity. She has not been consistently using nighttime oxygen, with saturation levels between 92% to 97% during the night. An overnight oxygen test previously showed levels dropping below 88% for a few minutes.     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 Also denies presyncope, palpitations, heartburn, abdominal pain, nausea, vomiting, diarrhea or change in bowel or urinary habits, dysuria,hematuria, rash, arthralgias, visual complaints, headache, numbness weakness or ataxia.      Objective:   Physical Exam  Gen. Pleasant, well-nourished, in no distress ENT - no thrush, no pallor/icterus,no post nasal drip Neck: No JVD, no thyromegaly, no carotid bruits Lungs: no use of accessory muscles, no dullness to percussion, BB rales no rhonchi  Cardiovascular: Rhythm regular, heart sounds  normal, no murmurs or gallops, no peripheral edema Musculoskeletal: No deformities, no cyanosis or clubbing        Assessment & Plan:   Assessment and Plan Assessment & Plan Pulmonary fibrosis with residual lung scarring High-resolution CT scan shows resolution of ground glass infiltrates, but residual lung scarring persists. Differential diagnosis includes idiopathic pulmonary fibrosis, rheumatoid arthritis-related fibrosis, and hypersensitivity pneumonitis. Pulmonary function tests indicate decreased diffusion capacity, suggesting scarring, but lung volumes are normal. No definitive diagnosis of pulmonary fibrosis at this time. Methotrexate and prednisone  have been used previously with some improvement in oxygen levels. Current symptoms do not necessitate immediate oxygen therapy. Discussed potential side effects of fibrosis medication, including skin sensitivity and liver  function monitoring. She prefers to  avoid medications that may cause diarrhea due to history of collagenous colitis. - Started approval process for perfenidone - Monitor liver function tests monthly for the first three months, then every three months. - Advised to avoid sun exposure and use sunscreen if starting fibrosis medication. - Continue to monitor symptoms and re-evaluate in three months.  Rheumatoid arthritis Joint swelling but no significant pain. Previous treatment with methotrexate was not well tolerated. Current serum markers for rheumatoid arthritis are negative. Rheumatoid arthritis may contribute to lung scarring. Discussed starting new injectable medication for rheumatoid arthritis, pending approval. She reports improvement in joint swelling with previous cortisone treatment. - defer to rheum re: actemra esp if disease not active in joints - Sent note to rheumatologist to inform about current status and treatment plan.

## 2024-08-03 ENCOUNTER — Telehealth (INDEPENDENT_AMBULATORY_CARE_PROVIDER_SITE_OTHER): Payer: Self-pay | Admitting: Audiology

## 2024-08-03 ENCOUNTER — Ambulatory Visit (INDEPENDENT_AMBULATORY_CARE_PROVIDER_SITE_OTHER): Payer: Self-pay | Admitting: Audiology

## 2024-08-03 DIAGNOSIS — H903 Sensorineural hearing loss, bilateral: Secondary | ICD-10-CM

## 2024-08-03 NOTE — Telephone Encounter (Signed)
 Patient came to pick up new hearing aid charger.  She tried to put her hearing aids in the charger to make sure they both made connection with the charging contacts and the charging light came on.  The patient's right hearing aid made connection correctly.  The left hearing aid still did not make connection with the charging contacts and the light did not come on.  Dr. Tiney stated she will send the hearing aid out to have it looked at.  Either the magnet inside the hearing aid has become demagnetized or it is missing.  We will call the patient once the hearing aid is back from repair.

## 2024-08-04 NOTE — Progress Notes (Signed)
  [address] [place], [place] [address] [phone]  Hearing Aid Check     CHAYSE GRACEY comes for a scheduled appointment for a hearing aid check.   Accompanied ab:[id number]     Right Left  Hearing aid manufacturer Phonak Audeo [id number] SN: [id number] Renaldo Oda SHAD SN: [id number]  Hearing aid style Receiver in the canal Receiver in the canal  Hearing aid battery rechargeable rechargeable  Receiver  33M  33M  Dome/ custom earpiece Large closed dome Large closed domes  Retention wire  no  no  Warranty expiration date 08-21-2024(?) 08-21-2024(?)  Loss and Damage unknown unknown  Additional accessories Expiration date      Initial fitting date 10-21-20 08-21-21  Device was fit at: [facility]     Patient came to pick up the new charger. When used in clinic we still noticed that the left aid was not fitting properly in the charger . Patient agreed to leave the left aid and I will send it to [facility] for repair.   Services fee: $0 was paid at checkout. $0 will be due at pick up. We will call the patient when it comes in.    Recommend: Return for a hearing aid check,as needed. Return for a hearing evaluation and to see an ENT, if concerns with hearing changes arise.    Almeda Ezra MARIE LEROUX-MARTINEZ, AUD

## 2024-08-06 ENCOUNTER — Ambulatory Visit (HOSPITAL_BASED_OUTPATIENT_CLINIC_OR_DEPARTMENT_OTHER): Admitting: Pulmonary Disease

## 2024-08-12 ENCOUNTER — Encounter (INDEPENDENT_AMBULATORY_CARE_PROVIDER_SITE_OTHER): Payer: Self-pay

## 2024-08-12 ENCOUNTER — Telehealth (INDEPENDENT_AMBULATORY_CARE_PROVIDER_SITE_OTHER): Payer: Self-pay | Admitting: Audiology

## 2024-08-12 ENCOUNTER — Ambulatory Visit (HOSPITAL_BASED_OUTPATIENT_CLINIC_OR_DEPARTMENT_OTHER): Admitting: Pulmonary Disease

## 2024-08-12 NOTE — Telephone Encounter (Signed)
 Called patient and LVM on home phone and mobile # letting her know that her Left hearing aid is ready for pickup.  $0 is due at pickup.  I requested the patient bring her charger with her when she picks up the hearing aid so we can make sure connection is being made between the hearing aid and charger.  I requested the patient pickup anytime today, tomorrow, Monday (08/16/24) or Tuesday (08/17/2024) between 8:30am - 3:30pm or next Wednesday, (08/18/2024) between 8:30am - 11am.  I will also send patient a MyChart msg.

## 2024-08-13 ENCOUNTER — Encounter (HOSPITAL_BASED_OUTPATIENT_CLINIC_OR_DEPARTMENT_OTHER): Payer: Self-pay | Admitting: Obstetrics & Gynecology

## 2024-08-13 ENCOUNTER — Telehealth (INDEPENDENT_AMBULATORY_CARE_PROVIDER_SITE_OTHER): Payer: Self-pay | Admitting: Audiology

## 2024-08-13 ENCOUNTER — Ambulatory Visit (HOSPITAL_BASED_OUTPATIENT_CLINIC_OR_DEPARTMENT_OTHER): Payer: Medicare Other | Admitting: Obstetrics & Gynecology

## 2024-08-13 ENCOUNTER — Ambulatory Visit (INDEPENDENT_AMBULATORY_CARE_PROVIDER_SITE_OTHER): Payer: Self-pay | Admitting: Audiology

## 2024-08-13 ENCOUNTER — Other Ambulatory Visit (HOSPITAL_BASED_OUTPATIENT_CLINIC_OR_DEPARTMENT_OTHER): Payer: Self-pay

## 2024-08-13 VITALS — BP 118/62 | HR 63 | Wt 178.8 lb

## 2024-08-13 DIAGNOSIS — H903 Sensorineural hearing loss, bilateral: Secondary | ICD-10-CM

## 2024-08-13 DIAGNOSIS — Z01419 Encounter for gynecological examination (general) (routine) without abnormal findings: Secondary | ICD-10-CM | POA: Diagnosis not present

## 2024-08-13 DIAGNOSIS — N3941 Urge incontinence: Secondary | ICD-10-CM

## 2024-08-13 DIAGNOSIS — L900 Lichen sclerosus et atrophicus: Secondary | ICD-10-CM | POA: Diagnosis not present

## 2024-08-13 DIAGNOSIS — Z1331 Encounter for screening for depression: Secondary | ICD-10-CM | POA: Diagnosis not present

## 2024-08-13 DIAGNOSIS — N3281 Overactive bladder: Secondary | ICD-10-CM | POA: Diagnosis not present

## 2024-08-13 DIAGNOSIS — Z90710 Acquired absence of both cervix and uterus: Secondary | ICD-10-CM | POA: Diagnosis not present

## 2024-08-13 LAB — POCT URINALYSIS DIP (CLINITEK)
Bilirubin, UA: NEGATIVE
Blood, UA: NEGATIVE
Glucose, UA: NEGATIVE mg/dL
Ketones, POC UA: NEGATIVE mg/dL
Leukocytes, UA: NEGATIVE
Nitrite, UA: NEGATIVE
POC PROTEIN,UA: NEGATIVE
Spec Grav, UA: 1.015 (ref 1.010–1.025)
Urobilinogen, UA: 0.2 U/dL
pH, UA: 5.5 (ref 5.0–8.0)

## 2024-08-13 MED ORDER — TRIAMCINOLONE ACETONIDE 0.5 % EX OINT: 1.0000 | TOPICAL_OINTMENT | Freq: Two times a day (BID) | CUTANEOUS | 0 refills | Status: AC

## 2024-08-13 MED ORDER — GEMTESA 75 MG PO TABS: 1.0000 | ORAL_TABLET | Freq: Every day | ORAL | 4 refills | Status: AC

## 2024-08-13 NOTE — Telephone Encounter (Signed)
 Patient picked up Left hearing aid.  She put it in the charger and it made connection for charging.  She put the hearing aid in her ear and reported that it was working well.

## 2024-08-13 NOTE — Progress Notes (Signed)
 Breast and Pelvic Exam Patient name: [name] MRN [id number]  Date of birth: [birth year] Chief Complaint:   Gynecologic Exam  History of Present Illness:   [name] is a 83 y.o. G2P2 Caucasian female being seen today for breast and pelvic exam.  Denies vaginal bleeding.  Having more issues with urinary incontinence that is worse at night. Has experienced several occurances of not being aware of really needing to void but then will stand up and have loss of urine that is significant enough she needs to change clothes.  Denies burning.  She is having some lower right discomfort on the right lower pelvic region.    She fell in her kitchen and fell.  She had a compression fracture L3 compression fracture.    H/o pulmonary fibrosis.    Patient's last menstrual period was 09/24/1979.    Last pap: Hysterectomy. H/O abnormal pap: no Last mammogram: 04/26/2024. Results were: normal. Family h/o breast cancer: no Last colonoscopy: 09/07/2019. Results were: normal. Family h/o colorectal cancer: no     08/13/2024    8:53 AM 07/01/2022    2:53 PM 10/03/2020    1:42 PM 07/29/2019    9:04 AM 07/26/2019   11:03 AM  Depression screen PHQ 2/9  Decreased Interest 0 0 0 1 1  Down, Depressed, Hopeless 0 0 0 0 0  PHQ - 2 Score 0 0 0 1 1  Altered sleeping   0 1 0  Tired, decreased energy   0 1 1  Change in appetite   0 0 1  Feeling bad or failure about yourself    0 0 0  Trouble concentrating   0 0 0  Moving slowly or fidgety/restless   0 0 0  Suicidal thoughts   0 0 0  PHQ-9 Score   0  3  3   Difficult doing work/chores    Not difficult at all Not difficult at all     Data saved with a previous flowsheet row definition        02/17/2019    2:47 PM  GAD 7 : Generalized Anxiety Score  Nervous, Anxious, on Edge 0  Control/stop worrying 0  Worry too much - different things 0  Trouble relaxing 0  Restless 0  Easily annoyed or irritable 0  Afraid - awful might happen 0  Total GAD 7  Score 0  Anxiety Difficulty Not difficult at all     Review of Systems:   Pertinent items are noted in HPI Denies any recent bowel changes.  Denies VB. Pertinent History Reviewed:  Reviewed past medical,surgical, social and family history.  Reviewed problem list, medications and allergies. Physical Assessment:   Vitals:   08/13/24 0854  BP: 118/62  Pulse: 63  SpO2: 99%  Weight: 178 lb 12.8 oz (81.1 kg)  Body mass index is 32.7 kg/m.        Physical Examination:   General appearance - well appearing, and in no distress  Mental status - alert, oriented to person, place, and time  Psych:  She has a normal mood and affect  Skin - warm and dry, normal color, no suspicious lesions noted  Chest - effort normal, all lung fields clear to auscultation bilaterally  Heart - normal rate and regular rhythm  Neck:  midline trachea, no thyromegaly or nodules  Breasts - breasts appear normal, no suspicious masses, no skin or nipple changes or  axillary nodes  Abdomen - soft, nontender, nondistended, no  masses or organomegaly  Pelvic - VULVA: hypopigmentation of inner labia minora with scarring of labia minor and decreased size, no  tenderness or lesions   VAGINA: atrophic changes   CERVIX: surgically absent  Thin prep pap is not indicated  UTERUS: surgically absent  ADNEXA: No adnexal masses or tenderness noted.  Rectal - normal rectal, good sphincter tone, no masses felt.   Extremities:  No swelling or varicosities noted  Chaperone present for exam  Results for orders placed or performed in visit on 08/13/24 (from the past 24 hours)  POCT URINALYSIS DIP (CLINITEK)   Collection Time: 08/13/24  9:52 AM  Result Value Ref Range   Color, UA yellow yellow   Clarity, UA clear clear   Glucose, UA negative negative mg/dL   Bilirubin, UA negative negative   Ketones, POC UA negative negative mg/dL   Spec Grav, UA 8.984 8.989 - 1.025   Blood, UA negative negative   pH, UA 5.5 5.0 - 8.0    POC PROTEIN,UA negative negative, trace   Urobilinogen, UA 0.2 0.2 or 1.0 E.U./dL   Nitrite, UA Negative Negative   Leukocytes, UA Negative Negative    Assessment & Plan:  1. Encntr for gyn exam (general) (routine) w/o abn findings (Primary) - Pap smear not indicated - Mammogram 04/09/2023 - Colonoscopy 2020.  Additional follow up not recommended.   - Bone mineral density 04/23/2023 - lab work done with PCP, Dr. Stephane - vaccines reviewed/updated  2. Urge incontinence of urine - will r/o infection.  D/w pt I am concerned this worsening of symptoms may be due to spine issues from earlier this year.  Will check culture, continue Gemtesa  and refer to consideration of urodynamics - Urine Culture - Ambulatory referral to Urogynecology - POCT URINALYSIS DIP (CLINITEK)  3. H/O: hysterectomy  4. OAB (overactive bladder) - Vibegron  (GEMTESA ) 75 MG TABS; Take 1 tablet (75 mg total) by mouth daily.  Dispense: 90 tablet; Refill: 4  5. Lichen sclerosus - triamcinolone  ointment (KENALOG ) 0.5 %; Apply 1 Application topically 2 (two) times daily. Use for up to 5 days.  Do not use more than twice monthly.  Dispense: 30 g; Refill: 0   Orders Placed This Encounter  Procedures   Urine Culture   Ambulatory referral to Urogynecology   POCT URINALYSIS DIP (CLINITEK)    Meds:  Meds ordered this encounter  Medications   Vibegron  (GEMTESA ) 75 MG TABS    Sig: Take 1 tablet (75 mg total) by mouth daily.    Dispense:  90 tablet    Refill:  4   triamcinolone  ointment (KENALOG ) 0.5 %    Sig: Apply 1 Application topically 2 (two) times daily. Use for up to 5 days.  Do not use more than twice monthly.    Dispense:  30 g    Refill:  0     Ronal GORMAN Pinal, MD 08/13/2024 12:13 PM

## 2024-08-13 NOTE — Progress Notes (Signed)
  [address], [address] [place], [place] [address] [phone]  Hearing Aid Check     [name] comes as a walk-in to pick up her left aid.       Right Left  Hearing aid manufacturer Renaldo Kirschner [id number] SN: [id number] Renaldo Kirschner SHAD SN: [id number]  Hearing aid style Receiver in the canal Receiver in the canal  Hearing aid battery rechargeable rechargeable  Receiver  5M  5M  Dome/ custom earpiece Large closed dome Large closed domes  Retention wire  no  no  Warranty expiration date 08-21-2024(?) 08-21-2024(?)  Loss and Damage unknown unknown  Additional accessories Expiration date      Initial fitting date 10-21-20 08-21-21  Device was fit at: [facility]      Left aid is now charging like it should. Patient picked up the aid. Ms. Wylie helped giving her the aid and patient said it was working well.  Services fee: $0 was paid at checkout.  Recommend: Return for a hearing aid check,as needed. Return for a hearing evaluation and to see an ENT, if concerns with hearing changes arise.    [name], AUD

## 2024-08-15 LAB — URINE CULTURE

## 2024-08-16 ENCOUNTER — Ambulatory Visit (HOSPITAL_BASED_OUTPATIENT_CLINIC_OR_DEPARTMENT_OTHER): Payer: Self-pay | Admitting: Obstetrics & Gynecology

## 2024-08-16 ENCOUNTER — Telehealth: Payer: Self-pay | Admitting: Physical Medicine and Rehabilitation

## 2024-08-16 ENCOUNTER — Other Ambulatory Visit: Payer: Self-pay | Admitting: Physical Medicine and Rehabilitation

## 2024-08-16 DIAGNOSIS — M5416 Radiculopathy, lumbar region: Secondary | ICD-10-CM

## 2024-08-16 NOTE — Telephone Encounter (Signed)
 Pt called requesting another injection injection in her back. Please call pt about this matter at [phone].

## 2024-08-16 NOTE — Progress Notes (Signed)
 Cardiology Office Note   Date:  08/24/2024  ID:  [name], DOB Sep 21, 1941, MRN [id number] PCP:  Maria Leita DEL, MD  Cardiologist:  Maria DELENA Leavens, MD  Electrophysiologist:  None   Chief Complaint: Follow up for HTN  History of Present Illness: .   [name] is a 83 y.o. female with visit-pertinent history of  hypertensive heart disease, hyperlipidemia, carotid artery stenosis and hypothyroidism.  Patient has been managed by vascular for carotid artery stenosis and by her PCP for hypothyroidism, she previously had the left lobe of her thyroid  removed due to a growing nodule which was not cancerous.   She has history of mildly leaky heart valve and mild dilation of the aorta noted on echocardiogram in 2020.  Patient was previously followed by Dr. Dann, she established with Dr. Leavens in January, 2025.  At that time patient reported occasional deep breathing at night but denied any shortness of breath.  She remained active participating in [place], [address] and church activities.  Echocardiogram on 10/30/2023 indicated LVEF of 55 to 60%, no RWMA, diastolic parameters were indeterminate, RV systolic function and size was normal, normal PASP, noted to have caseous mitral annular calcification with mild mitral valve regurgitation, mean mitral valve gradient 3.0 mmHg, severe mitral annular calcification.  There was mild calcification of the aortic valve with no evidence of stenosis.   On 02/15/2024 patient's daughter called the after-hours call service reporting that patient had a fall several months prior and broke vertebrae, she did become overall sedentary and was healing slowly.  She was also being given methotrexate injections.  Patient reporting feeling increasingly short of breath over the past several days mostly with exertion, denied chest pain, lightheadedness, syncope, leg edema, rapid weight gain.  On 5/26 patient presented to the ED with worsening shortness of breath,  patient was hyponatremic and hypokalemic, creatinine 1.7 BNP 173 and procalcitonin 0.17, leukocytosis 16.7, anemic at 10.2 with D-dimer elevated at 2.4.  Chest x-ray showed no active disease CT of chest showed patchy groundglass opacity in the lungs bilaterally with subpleural reticulation most prominent in the mid and lower lungs compatible with interstitial lung disease felt to be superimposed component of acute infectious/inflammatory etiology.  She was admitted for acute hypoxic respiratory failure, treated with IV steroids and oxygen.  Patient was referred to pulmonary for follow-up.  Given AKI patient's telmisartan  and chlorthalidone  were held.  She was continued on metoprolol  and verapamil .   On 03/18/2024 patient notified the office of elevated pressures of 175/75, 199/73, 194/80 before taking medications.  Patient reported that she had been on a prednisone  taper since May.  She was she also reported increased leg swelling. Reviewed by Dr. Leavens, patient was started on chlorthalidone  25 mg Pham.   Patient was seen in clinic on 03/22/2024.  She reported that she had been doing well.  She reported her lower extremity edema had improved however her blood pressure remained elevated in the morning prior to taking her medications, reported that the morning of appointment it was 194/80 however would typically improve after taking morning medications.  She reported chronic and stable dyspnea, denied any specific changes.  Given patient's recent AKI she was not restarted on telmisartan , was started on hydralazine  50 mg twice Pham and patient was referred to pharmacy team.  She was seen by Josette Bong, PHM on 04/23/2024.  Is noted that in the afternoon patient's blood pressures were well-controlled however in the morning her blood pressure to be elevated  to 170 prior to medications.  She was continued on chlorthalidone  25 mg Pham, hydralazine  50 mg twice Pham, metoprolol  succinate 100 mg Pham and  verapamil  360 mg Pham.  Her medications were divided in an effort to avoid a.m. elevations with patient taking chlorthalidone , verapamil  and hydralazine  in the morning, metoprolol , verapamil  to 40 and hydralazine  at night.   Patient was seen in clinic on 05/31/2024 for follow-up.  She reports she been doing well overall.  She denied any chest pain, significant shortness of breath, orthopnea or PND.  Patient's initial blood pressure was 104/62, on recheck 110/60, patient had noted increased lower extremity edema with initiation of hydralazine , this was decreased to 25 mg twice Pham as she also noted increased lower extremity edema.  Patient was last seen in clinic on 07/20/2024 for follow-up.  She reported she been doing well, noted that her blood pressure been elevated at home with decreasing of hydralazine  however reported that her lower extremity edema had resolved.  She reported that her systolic blood pressure at home have been averaging in the 150s.  Patient's metoprolol  was stopped and she was started on carvedilol  12.5 mg twice Pham.  Today she presents for follow-up.  She reports that she has been doing well overall.  She reports that her blood pressures have been better controlled at home with changing to carvedilol .  She denies any chest pain, increased lower extremity edema, orthopnea or PND.  She notes that she has some mild and stable shortness of breath given history of ILD, followed by Dr. Jude.  She denies any palpitations.  She reports that from a cardiac standpoint she is doing very well overall.  She notes she tries stay active however is slightly limited by back pain.  Labwork independently reviewed: 05/25/2024: Hemoglobin 11.5, hematocrit 35.2, creatinine 1.24, sodium 138, potassium 3.9, AST 27, ALT 29 ROS: .   Today she denies chest pain, shortness of breath, lower extremity edema, fatigue, palpitations, melena, hematuria, hemoptysis, diaphoresis, weakness, presyncope, syncope,  orthopnea, and PND.  All other systems are reviewed and otherwise negative. Studies Reviewed: SABRA   EKG:  EKG is ordered today, personally reviewed, demonstrating  EKG Interpretation Date/Time:  Tuesday August 24 2024 09:58:52 EST Ventricular Rate:  61 PR Interval:  184 QRS Duration:  86 QT Interval:  460 QTC Calculation: 463 R Axis:   -7  Text Interpretation: Normal sinus rhythm Normal ECG Confirmed by Desiderio Dolata [phone]) on 08/24/2024 10:06:02 AM   CV Studies: Cardiac studies reviewed are outlined and summarized above. Otherwise please see EMR for full report. Cardiac Studies & Procedures   ______________________________________________________________________________________________   STRESS TESTS  MYOCARDIAL PERFUSION IMAGING 03/31/2019  Interpretation Summary  Nuclear stress EF: 77%. The left ventricular ejection fraction is hyperdynamic (>65%).  There was no ST segment deviation noted during stress.  Defect 1: There is a small defect of mild severity present in the basal anterior, mid anterior and apical anterior location. This appears to be most consistent with breast attenuation  This is a low risk study. There is no evidence of ischemia or previous infarction.  The study is normal.   ECHOCARDIOGRAM  ECHOCARDIOGRAM COMPLETE 10/30/2023  Narrative ECHOCARDIOGRAM REPORT    Patient Name:   KATHEE TUMLIN Date of Exam: 10/30/2023 Medical Rec #:  [id number]       Height:       60.0 in Accession #:    [id number]      Weight:       182.6 lb  Date of Birth:  1941-08-27        BSA:          1.796 m Patient Age:    82 years        BP:           153/67 mmHg Patient Gender: F               HR:           73 bpm. Exam Location:  [address]  Procedure: 2D Echo, 3D Echo, Cardiac Doppler, Color Doppler and Strain Analysis  Indications:    I77.810 Aortic root dilation  History:        Patient has prior history of Echocardiogram examinations, most recent 04/02/2019. Carotid  Disease and CKD; Risk Factors:HLD.  Sonographer:    Waldo Guadalajara RCS Referring Phys: [id number] MAHESH A CHANDRASEKHAR  IMPRESSIONS   1. Left ventricular ejection fraction, by estimation, is 55 to 60%. Left ventricular ejection fraction by 3D volume is 57 %. The left ventricle has normal function. The left ventricle has no regional wall motion abnormalities. Left ventricular diastolic parameters are indeterminate. The average left ventricular global longitudinal strain is -19.9 %. The global longitudinal strain is normal. 2. Right ventricular systolic function is normal. The right ventricular size is normal. There is normal pulmonary artery systolic pressure. 3. Caseous mitral annular calcification noted. The mitral valve is degenerative. Mild mitral valve regurgitation. The mean mitral valve gradient is 3.0 mmHg with average heart rate of 72 bpm. Severe mitral annular calcification. 4. The aortic valve is tricuspid. There is mild calcification of the aortic valve. Aortic valve regurgitation is mild. Aortic valve sclerosis is present, with no evidence of aortic valve stenosis. 5. The inferior vena cava is normal in size with greater than 50% respiratory variability, suggesting right atrial pressure of 3 mmHg.  Comparison(s): Prior images reviewed side by side. Mitral valve calcification has progressed.  FINDINGS Left Ventricle: Left ventricular ejection fraction, by estimation, is 55 to 60%. Left ventricular ejection fraction by 3D volume is 57 %. The left ventricle has normal function. The left ventricle has no regional wall motion abnormalities. The average left ventricular global longitudinal strain is -19.9 %. The global longitudinal strain is normal. The left ventricular internal cavity size was normal in size. There is no left ventricular hypertrophy. Left ventricular diastolic parameters are indeterminate.  Right Ventricle: The right ventricular size is normal. No increase in right  ventricular wall thickness. Right ventricular systolic function is normal. There is normal pulmonary artery systolic pressure. The tricuspid regurgitant velocity is 2.34 m/s, and with an assumed right atrial pressure of 3 mmHg, the estimated right ventricular systolic pressure is 24.9 mmHg.  Left Atrium: Left atrial size was normal in size.  Right Atrium: Right atrial size was normal in size.  Pericardium: Trivial pericardial effusion is present. The pericardial effusion is surrounding the apex. Presence of epicardial fat layer.  Mitral Valve: Caseous mitral annular calcification noted. The mitral valve is degenerative in appearance. Severe mitral annular calcification. Mild mitral valve regurgitation. MV peak gradient, 7.7 mmHg. The mean mitral valve gradient is 3.0 mmHg with average heart rate of 72 bpm.  Tricuspid Valve: The tricuspid valve is normal in structure. Tricuspid valve regurgitation is mild . No evidence of tricuspid stenosis.  Aortic Valve: The aortic valve is tricuspid. There is mild calcification of the aortic valve. Aortic valve regurgitation is mild. Aortic regurgitation PHT measures 330 msec. Aortic valve sclerosis is present, with no  evidence of aortic valve stenosis.  Pulmonic Valve: The pulmonic valve was normal in structure. Pulmonic valve regurgitation is trivial. No evidence of pulmonic stenosis.  Aorta: The aortic root and ascending aorta are structurally normal, with no evidence of dilitation.  Venous: The inferior vena cava is normal in size with greater than 50% respiratory variability, suggesting right atrial pressure of 3 mmHg.  IAS/Shunts: The atrial septum is grossly normal.   LEFT VENTRICLE PLAX 2D LVIDd:         4.30 cm         Diastology LVIDs:         3.10 cm         LV e' medial:    6.42 cm/s LV PW:         0.80 cm         LV E/e' medial:  14.4 LV IVS:        0.60 cm         LV e' lateral:   8.49 cm/s LVOT diam:     1.60 cm         LV E/e'  lateral: 10.9 LV SV:         53 LV SV Index:   30              2D LVOT Area:     2.01 cm        Longitudinal Strain 2D Strain GLS  -21.0 % (A2C): 2D Strain GLS  -19.2 % (A3C): 2D Strain GLS  19.5 % (A4C): 2D Strain GLS  -19.9 % Avg:  3D Volume EF LV 3D EF:    Left ventricul ar ejection fraction by 3D volume is 57 %.  3D Volume EF: 3D EF:        57 % LV EDV:       80 ml LV ESV:       35 ml LV SV:        46 ml  RIGHT VENTRICLE RV Basal diam:  3.20 cm RV S prime:     11.40 cm/s TAPSE (M-mode): 1.9 cm RVSP:           24.9 mmHg  LEFT ATRIUM             Index        RIGHT ATRIUM           Index LA diam:        4.00 cm 2.23 cm/m   RA Pressure: 3.00 mmHg LA Vol (A2C):   41.8 ml 23.28 ml/m  RA Area:     9.58 cm LA Vol (A4C):   39.6 ml 22.05 ml/m  RA Volume:   18.60 ml  10.36 ml/m LA Biplane Vol: 41.6 ml 23.17 ml/m AORTIC VALVE LVOT Vmax:   101.00 cm/s LVOT Vmean:  78.200 cm/s LVOT VTI:    0.264 m AI PHT:      330 msec  AORTA Ao Root diam: 2.60 cm Ao Asc diam:  2.60 cm  MITRAL VALVE                TRICUSPID VALVE MV Area (PHT): 3.45 cm     TR Peak grad:   21.9 mmHg MV Area VTI:   1.58 cm     TR Vmax:        234.00 cm/s MV Peak grad:  7.7 mmHg     Estimated RAP:  3.00 mmHg MV Mean grad:  3.0 mmHg     RVSP:  24.9 mmHg MV Vmax:       1.39 m/s MV Vmean:      72.9 cm/s    SHUNTS MV Decel Time: 220 msec     Systemic VTI:  0.26 m MV E velocity: 92.20 cm/s   Systemic Diam: 1.60 cm MV A velocity: 131.00 cm/s MV E/A ratio:  0.70  Maria Leavens MD Electronically signed by Maria Leavens MD Signature Date/Time: 10/30/2023/3:17:09 PM    Final          ______________________________________________________________________________________________       Current Reported Medications:.    Current Meds  Medication Sig   acetaminophen  (TYLENOL ) 500 MG tablet Take 1,000 mg by mouth 2 (two) times Pham.   aspirin  81 MG EC tablet Take 81  mg by mouth Pham.   B Complex Vitamins (VITAMIN-B COMPLEX PO) Take 1 tablet by mouth Pham with breakfast.   calcium  carbonate (TUMS - DOSED IN MG ELEMENTAL CALCIUM ) 500 MG chewable tablet Chew 1 tablet by mouth Pham as needed for indigestion or heartburn.   carvedilol  (COREG ) 12.5 MG tablet Take 1 tablet (12.5 mg total) by mouth 2 (two) times Pham.   chlorthalidone  (HYGROTON ) 25 MG tablet Take 1 tablet (25 mg total) by mouth Pham.   Cholecalciferol (VITAMIN D3) 50 MCG (2000 UT) CAPS Take 2,000 Units by mouth 3 (three) times a week. Tuesdays, Thursdays and Saturdays   denosumab  (PROLIA ) 60 MG/ML SOSY injection Inject 60 mg into the skin every 6 (six) months.   folic acid (FOLVITE) 1 MG tablet Take 1 mg by mouth Pham.   KRILL OIL PO Take 1 tablet by mouth Pham.   levothyroxine  (SYNTHROID ) 112 MCG tablet Take 1 tablet (112 mcg total) by mouth Pham.   loratadine (CLARITIN) 10 MG tablet Take 10 mg by mouth Pham as needed for allergies.   Multiple Vitamin (MULTIVITAMIN) tablet Take 1 tablet by mouth Pham.     pantoprazole  (PROTONIX ) 40 MG tablet Take 1 tablet (40 mg total) by mouth Pham.   polyethylene glycol (MIRALAX  / GLYCOLAX ) 17 g packet Take 17 g by mouth every evening.   rosuvastatin  (CRESTOR ) 20 MG tablet TAKE 1 TABLET BY MOUTH Pham   triamcinolone  ointment (KENALOG ) 0.5 % Apply 1 Application topically 2 (two) times Pham. Use for up to 5 days.  Do not use more than twice monthly.   Vibegron  (GEMTESA ) 75 MG TABS Take 1 tablet (75 mg total) by mouth Pham.   [DISCONTINUED] hydrALAZINE  (APRESOLINE ) 25 MG tablet Take 1 tablet (25 mg total) by mouth in the morning and at bedtime.   [DISCONTINUED] verapamil  (CALAN -SR) 240 MG CR tablet Take 1.5 tablets (360 mg total) by mouth Pham with breakfast.   Physical Exam:    VS:  BP 120/60   Pulse 61   Ht 5' 2.5 (1.588 m)   Wt 181 lb (82.1 kg)   LMP 09/24/1979   SpO2 96%   BMI 32.58 kg/m    Wt Readings from Last 3 Encounters:  08/24/24  181 lb (82.1 kg)  08/13/24 178 lb 12.8 oz (81.1 kg)  08/02/24 179 lb 1.6 oz (81.2 kg)    GEN: Well nourished, well developed in no acute distress NECK: No JVD; No carotid bruits CARDIAC: RRR, no murmurs, rubs, gallops RESPIRATORY:  Clear to auscultation without rales, wheezing or rhonchi  ABDOMEN: Soft, non-tender, non-distended EXTREMITIES:  No edema; No acute deformity     Asessement and Plan:SABRA    Hypertension/lower extremity edema: Blood pressure today 120/60. Blood pressure at  home has been overall well-controlled. Continue carvedilol  12.5 mg twice Pham, chlorthalidone  25 mg Pham, hydralazine  25 mg twice Pham, verapamil  120 mg in the morning and 240 mg in the afternoon.  Encouraged patient to continue monitoring blood pressures at home.  ILD: Patient reports that her breathing has been stable.  She is followed by Dr. Jude.  Severe mitral annular calcification: Noted on echo in 10/2023.  She denies any chest pain or increased shortness of breath, denies any creased lower extremity edema.  Hyperlipidemia: On rosuvastatin  20 mg Pham.  Carotid artery stenosis: Monitored and managed per vascular specialist.  Hypothyroidism: Patient with history of partial thyroidectomy for noncancerous nodule.  She is followed by her PCP.   Disposition: F/u with Dr. Santo in three months or sooner if needed.   Signed, Audery Wassenaar D Jalila Goodnough, NP

## 2024-08-17 ENCOUNTER — Telehealth (HOSPITAL_BASED_OUTPATIENT_CLINIC_OR_DEPARTMENT_OTHER): Payer: Self-pay

## 2024-08-17 DIAGNOSIS — J849 Interstitial pulmonary disease, unspecified: Secondary | ICD-10-CM

## 2024-08-17 NOTE — Telephone Encounter (Signed)
 Copied from CRM #[id number]. Topic: Clinical - Medication Question >> Aug 17, 2024 10:22 AM [name] wrote: Reason for CRM: Patient is calling to check on the status of her new medications. She saw Dr. Jude on 11/10 and he was working on getting her on perfenidone but she has not yet heard back on this. She would like to know where it is in the process of approval or any alternative if not approved. Please call to advise.

## 2024-08-18 ENCOUNTER — Telehealth: Payer: Self-pay

## 2024-08-18 ENCOUNTER — Telehealth: Payer: Self-pay | Admitting: Internal Medicine

## 2024-08-18 DIAGNOSIS — J849 Interstitial pulmonary disease, unspecified: Secondary | ICD-10-CM

## 2024-08-18 NOTE — Telephone Encounter (Signed)
*  STAT* If patient is at the pharmacy, call can be transferred to refill team.   1. Which medications need to be refilled? (please list name of each medication and dose if known)   verapamil  (CALAN -SR) 240 MG CR tablet   2. Would you like to learn more about the convenience, safety, & potential cost savings by using the [name]?   3. Are you open to using the [facility] (Type [facility]. ).  4. Which pharmacy/location (including street and city if local pharmacy) is medication to be sent to?  [facility] - [place], [place] - [address]   5. Do they need a 30 day or 90 day supply?   90 day  Caller [name]) stated patient recently transferred to [name].

## 2024-08-18 NOTE — Telephone Encounter (Signed)
 Received pirfenidone new start paperwork via fax. Submitted a Prior Authorization request to [employer] for PIRFENIDONE via CoverMyMeds. Will update once we receive a response.  Key: [id number]

## 2024-08-23 ENCOUNTER — Other Ambulatory Visit (HOSPITAL_COMMUNITY): Payer: Self-pay

## 2024-08-23 NOTE — Telephone Encounter (Signed)
 Pt should have enough Verapamil  until January.  Is scheduled to see Katlyn West, NP, 08/24/24.  Will forward to her to fill at visit.

## 2024-08-23 NOTE — Telephone Encounter (Signed)
 Received notification from [facility] regarding a prior authorization for PIRFENIDONE. Authorization has been APPROVED from 08/18/24 to 09/22/25. Approval letter sent to scan center.  Per test claim, copay for 30 days supply is $0  Patient can fill through [facility]: [phone]   Authorization # [id number] Phone # [phone]

## 2024-08-24 ENCOUNTER — Encounter: Payer: Self-pay | Admitting: Cardiology

## 2024-08-24 ENCOUNTER — Ambulatory Visit: Attending: Cardiology | Admitting: Cardiology

## 2024-08-24 VITALS — BP 120/60 | HR 61 | Ht 62.5 in | Wt 181.0 lb

## 2024-08-24 DIAGNOSIS — E782 Mixed hyperlipidemia: Secondary | ICD-10-CM | POA: Diagnosis not present

## 2024-08-24 DIAGNOSIS — R600 Localized edema: Secondary | ICD-10-CM | POA: Diagnosis not present

## 2024-08-24 DIAGNOSIS — I3481 Nonrheumatic mitral (valve) annulus calcification: Secondary | ICD-10-CM | POA: Diagnosis not present

## 2024-08-24 DIAGNOSIS — I119 Hypertensive heart disease without heart failure: Secondary | ICD-10-CM

## 2024-08-24 DIAGNOSIS — I6523 Occlusion and stenosis of bilateral carotid arteries: Secondary | ICD-10-CM

## 2024-08-24 DIAGNOSIS — E890 Postprocedural hypothyroidism: Secondary | ICD-10-CM

## 2024-08-24 MED ORDER — HYDRALAZINE HCL 25 MG PO TABS: 25.0000 mg | ORAL_TABLET | Freq: Two times a day (BID) | ORAL | 3 refills | Status: AC

## 2024-08-24 MED ORDER — VERAPAMIL HCL ER 240 MG PO TBCR: 360.0000 mg | EXTENDED_RELEASE_TABLET | Freq: Every day | ORAL | 3 refills | Status: AC

## 2024-08-24 NOTE — Patient Instructions (Signed)
 Medication Instructions:  Your physician recommends that you continue on your current medications as directed. Please refer to the Current Medication list given to you today.  *If you need a refill on your cardiac medications before your next appointment, please call your pharmacy*  Lab Work: NONE If you have labs (blood work) drawn today and your tests are completely normal, you will receive your results only by: MyChart Message (if you have MyChart) OR A paper copy in the mail If you have any lab test that is abnormal or we need to change your treatment, we will call you to review the results.  Testing/Procedures: NONE  Follow-Up: At [facility], you and your health needs are our priority.  As part of our continuing mission to provide you with exceptional heart care, our providers are all part of one team.  This team includes your primary Cardiologist (physician) and Advanced Practice Providers or APPs (Physician Assistants and Nurse Practitioners) who all work together to provide you with the care you need, when you need it.  Your next appointment:   3 month(s)  Provider:   Dr. Santo

## 2024-08-26 ENCOUNTER — Ambulatory Visit: Attending: Pulmonary Disease

## 2024-08-26 ENCOUNTER — Telehealth: Payer: Self-pay | Admitting: *Deleted

## 2024-08-26 DIAGNOSIS — Z5181 Encounter for therapeutic drug level monitoring: Secondary | ICD-10-CM

## 2024-08-26 DIAGNOSIS — T50905A Adverse effect of unspecified drugs, medicaments and biological substances, initial encounter: Secondary | ICD-10-CM

## 2024-08-26 MED ORDER — PIRFENIDONE 267 MG PO TABS
ORAL_TABLET | ORAL | 0 refills | Status: AC
  Filled 2024-08-30: qty 207, 30d supply, fill #0

## 2024-08-26 MED ORDER — PIRFENIDONE 267 MG PO TABS
ORAL_TABLET | ORAL | 1 refills | Status: AC
  Filled 2024-08-30: qty 270, fill #0
  Filled 2024-09-21 (×2): qty 270, 30d supply, fill #0
  Filled 2024-10-22: qty 270, 30d supply, fill #1

## 2024-08-26 NOTE — Telephone Encounter (Signed)
 ATC patient for new start pirfenidone counseling - LVMTCB.

## 2024-08-26 NOTE — Telephone Encounter (Signed)
 Copied from CRM #[id number]. Topic: Clinical - Medication Prior Auth >> Aug 24, 2024  3:38 PM Ismael A wrote: Reason for CRM: patient called in to follow up on auth for perfenidone, she is requesting a call back with any updates and stated she would like to be on any medication if this one is not approved for her condition - ph# [phone]

## 2024-08-26 NOTE — Progress Notes (Signed)
 [facility]  Referring Provider: Dr. Jude  Virtual Visit via Telephone Note  I connected with [name] on 08/26/24 at  2:00 PM EST by telephone and verified that I am speaking with the correct person using two identifiers.  Location: Patient: home Provider: office   I discussed the limitations, risks, security and privacy concerns of performing an evaluation and management service by telephone and the availability of in person appointments. I also discussed with the patient that there may be a patient responsible charge related to this service. The patient expressed understanding and agreed to proceed.  Subjective:  Patient presents via telephone to [facility] for Esbriet new start counseling.  Patient was last seen by her pulmonologist, Dr. Jude, on 08/02/24. PMH includes HTN, RA, PAD, hypothyroidism, and CKD stage IIIa. From documentation, differential diagnosis includes IPF, RA-related fibrosis, and hypersensitivity pneumonitis. No prior antifibrotic therapy. She prefers to avoid medications that may cause diarrhea due to history of collagenous colitis. Referred to pharmacotherapy team to start Esbriet.    Objective: Allergies  Allergen Reactions   Methotrexate Shortness Of Breath   Leflunomide Other (See Comments)   Zebeta Other (See Comments)    Does not work, per patient.   Ace Inhibitors Other (See Comments)    cough   Hydroxychloroquine Sulfate Rash    Outpatient Encounter Medications as of 08/26/2024  Medication Sig   acetaminophen  (TYLENOL ) 500 MG tablet Take 1,000 mg by mouth 2 (two) times daily.   aspirin  81 MG EC tablet Take 81 mg by mouth daily.   B Complex Vitamins (VITAMIN-B COMPLEX PO) Take 1 tablet by mouth daily with breakfast.   calcium  carbonate (TUMS - DOSED IN MG ELEMENTAL CALCIUM ) 500 MG chewable tablet Chew 1 tablet by mouth daily as needed for indigestion or heartburn.   carvedilol  (COREG ) 12.5 MG tablet  Take 1 tablet (12.5 mg total) by mouth 2 (two) times daily.   chlorthalidone  (HYGROTON ) 25 MG tablet Take 1 tablet (25 mg total) by mouth daily.   Cholecalciferol (VITAMIN D3) 50 MCG (2000 UT) CAPS Take 2,000 Units by mouth 3 (three) times a week. Tuesdays, Thursdays and Saturdays   denosumab  (PROLIA ) 60 MG/ML SOSY injection Inject 60 mg into the skin every 6 (six) months.   folic acid (FOLVITE) 1 MG tablet Take 1 mg by mouth daily.   hydrALAZINE  (APRESOLINE ) 25 MG tablet Take 1 tablet (25 mg total) by mouth in the morning and at bedtime.   KRILL OIL PO Take 1 tablet by mouth daily.   levothyroxine  (SYNTHROID ) 112 MCG tablet Take 1 tablet (112 mcg total) by mouth daily.   loratadine (CLARITIN) 10 MG tablet Take 10 mg by mouth daily as needed for allergies.   Multiple Vitamin (MULTIVITAMIN) tablet Take 1 tablet by mouth daily.     pantoprazole  (PROTONIX ) 40 MG tablet Take 1 tablet (40 mg total) by mouth daily.   polyethylene glycol (MIRALAX  / GLYCOLAX ) 17 g packet Take 17 g by mouth every evening.   rosuvastatin  (CRESTOR ) 20 MG tablet TAKE 1 TABLET BY MOUTH DAILY   triamcinolone  ointment (KENALOG ) 0.5 % Apply 1 Application topically 2 (two) times daily. Use for up to 5 days.  Do not use more than twice monthly.   verapamil  (CALAN -SR) 240 MG CR tablet Take 1.5 tablets (360 mg total) by mouth daily with breakfast.   Vibegron  (GEMTESA ) 75 MG TABS Take 1 tablet (75 mg total) by mouth daily.   No facility-administered encounter medications on  file as of 08/26/2024.     Immunization History  Administered Date(s) Administered   Fluad Quad(high Dose 65+) 06/30/2019, 07/14/2020   INFLUENZA, HIGH DOSE SEASONAL PF 07/01/2016, 06/24/2017   Influenza Split 07/14/2020   Influenza, Quadrivalent, Recombinant, Inj, Pf 06/15/2024   Influenza-Unspecified 06/23/2014, 07/28/2018   Moderna Sars-Covid-2 Vaccination 10/04/2019, 11/01/2019, 06/27/2020   Pfizer Covid-19 Vaccine Bivalent Booster 68yrs & up 07/17/2024    Pneumococcal Conjugate-13 07/31/2015   Pneumococcal Polysaccharide-23 02/23/2021   Tdap 06/17/2011      PFT's TLC  Date Value Ref Range Status  08/02/2024 3.76 L Final     CMP     Component Value Date/Time   NA 139 03/25/2024 1240   K 4.8 03/25/2024 1240   CL 100 03/25/2024 1240   CO2 22 03/25/2024 1240   GLUCOSE 95 03/25/2024 1240   GLUCOSE 134 (H) 02/19/2024 0245   BUN 27 03/25/2024 1240   CREATININE 1.18 (H) 03/25/2024 1240   CREATININE 1.00 (H) 08/26/2016 1035   CALCIUM  9.7 03/25/2024 1240   PROT 5.8 (L) 02/17/2024 0248   PROT 7.0 06/15/2020 1210   ALBUMIN  2.6 (L) 02/17/2024 0248   ALBUMIN  4.4 06/15/2020 1210   AST 34 02/17/2024 0248   ALT 32 02/17/2024 0248   ALKPHOS 49 02/17/2024 0248   BILITOT 0.7 02/17/2024 0248   BILITOT 0.5 06/15/2020 1210   GFR 56.66 (L) 03/31/2015 1032   EGFR 46 (L) 03/25/2024 1240   GFRNONAA 28 (L) 02/19/2024 0245   GFRNONAA 54 (L) 08/26/2016 1035   eGFR 46 (03/25/24)   LFT's    Latest Ref Rng & Units 02/17/2024    2:48 AM 02/16/2024   10:38 AM 06/15/2020   12:10 PM  Hepatic Function  Total Protein 6.5 - 8.1 g/dL 5.8  6.3  7.0   Albumin  3.5 - 5.0 g/dL 2.6  2.9  4.4   AST 15 - 41 U/L 34  37  17   ALT 0 - 44 U/L 32  32  26   Alk Phosphatase 38 - 126 U/L 49  51  58   Total Bilirubin 0.0 - 1.2 mg/dL 0.7  0.9  0.5       HRCT (07/08/24) IMPRESSION: 1. Pulmonary parenchymal pattern of interstitial lung disease, as detailed above, with resolution of previously seen patchy bilateral ground-glass. 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 Findings are categorized as probable UIP per consensus guidelines: Diagnosis of Idiopathic Pulmonary Fibrosis: An Official ATS/ERS/JRS/ALAT Clinical Practice Guideline. Am JINNY [employer] Crit Care Med Vol 198, Iss 5, [id number], May 24 2017.  Assessment and Plan  Esbriet Medication Management Thoroughly counseled patient on the efficacy, mechanism of action,  dosing, administration, adverse effects, and monitoring parameters of Esbriet.  Patient verbalized understanding.   Goals of Therapy: Will not stop or reverse the progression of ILD. It will slow the progression of ILD.   Dosing: Starting dose will be Esbriet 267 mg 1 tablet three times daily for 7 days, then 2 tablets three times daily for 7 days, then 3 tablets three times daily. Stressed the importance of taking with meals and space at least 5-6 hours apart to minimize stomach upset.   Calculated CrCl = 45 ml/min  - Age 83 YOF - Scr 1.18 (03/25/24) - TBW 82.1kg   Adverse Effects: Nausea, vomiting, diarrhea, weight loss Abdominal pain GERD Sun sensitivity/rash - patient advised to wear sunscreen when exposed to sunlight Dizziness Fatigue  Monitoring: Monitor for diarrhea, nausea and vomiting, GI perforation,  hepatotoxicity  Monitor LFTs - baseline, monthly for first 3 months, then every 3 months routinely  Access: Approval of Esbriet through: insurance Rx sent to: [facility]: [phone]   Medication Reconciliation A drug regimen assessment was performed, including review of allergies, interactions, disease-state management, dosing and immunization history. Medications were reviewed with the patient, including name, instructions, indication, goals of therapy, potential side effects, importance of adherence, and safe use.  PLAN:  - START pirfenidone 267mg  tab, Take 1 tab three times daily for 7 days, then 2 tabs three times daily for 7 days, then 3 tabs three times daily thereafter. - Monitor LFTs monthly x 3 months, then every 3 months thereafter. Patient plans to obtain at [facility] - . Standing orders entered.  - Notify our office if you have significant side effects. Patient confirms she has pharmacy team contact line: [phone].  - Patient will expect phone call from [facility] for onboarding prior to first shipment of pirfenidone.  - Follow-up with Dr. Jude  as planned on 11/01/2024  I discussed the assessment and treatment plan with the patient. The patient was provided an opportunity to ask questions and all were answered. The patient agreed with the plan and demonstrated an understanding of the instructions.   The patient was advised to call back or seek an in-person evaluation if the symptoms worsen or if the condition fails to improve as anticipated.  I provided 30 minutes of non-face-to-face time during this encounter.  Aleck Puls, PharmD, BCPS, CPP Clinical Pharmacist  [facility]   Thank you for involving pharmacy to assist in providing this patient's care.

## 2024-08-26 NOTE — Telephone Encounter (Signed)
 Pt was approved and will be starting medication soon, she is aware.

## 2024-08-26 NOTE — Telephone Encounter (Deleted)
 Copied from CRM #[id number]. Topic: Clinical - Medication Prior Auth >> Aug 24, 2024  3:38 PM Ismael A wrote: Reason for CRM: patient called in to follow up on auth for perfenidone, she is requesting a call back with any updates and stated she would like to be on any medication if this one is not approved for her condition - ph# [phone]

## 2024-08-30 ENCOUNTER — Other Ambulatory Visit: Payer: Self-pay

## 2024-08-30 NOTE — Progress Notes (Signed)
 Specialty Pharmacy Initial Fill Coordination Note  [name] is a 83 y.o. female contacted today regarding initial fill of specialty medication(s) Pirfenidone    Patient requested Delivery   Delivery date: 09/01/24   Verified address: [id number], [place], [place] [address]   Medication will be filled on: 08/31/24   Patient is aware of $0 copayment.    **Can leave package at the front desk if the patient is not available**

## 2024-08-30 NOTE — Telephone Encounter (Signed)
 See pharmacotherapy visit note 08/26/24 - counseling completed

## 2024-08-31 ENCOUNTER — Other Ambulatory Visit: Payer: Self-pay

## 2024-08-31 ENCOUNTER — Telehealth: Payer: Self-pay

## 2024-08-31 NOTE — Telephone Encounter (Unsigned)
 Copied from CRM [id number]. Topic: Clinical - Medication Question >> Aug 30, 2024 10:33 AM Benton KIDD wrote: Reason for CRM: patient is asking for pharmacist caroline   Is needing her number she talk to her about a medication that dr jude is starting me on and I have questions and I need to talk to her   [phone] >> Aug 30, 2024  3:43 PM Lavanda D wrote: Patient is returning a missed call from [place] regarding her medication from Dr. Jude, patient said when she answered there was nobody there. Please call back regarding medication when available.   Disregard: While on hold patient received call back.

## 2024-09-02 NOTE — Discharge Instructions (Signed)

## 2024-09-02 NOTE — Telephone Encounter (Signed)
 ATC patient - LVM with my direct line to return call if she has questions.

## 2024-09-03 ENCOUNTER — Other Ambulatory Visit: Payer: Self-pay | Admitting: Physical Medicine and Rehabilitation

## 2024-09-03 ENCOUNTER — Inpatient Hospital Stay
Admission: RE | Admit: 2024-09-03 | Discharge: 2024-09-03 | Attending: Physical Medicine and Rehabilitation | Admitting: Physical Medicine and Rehabilitation

## 2024-09-03 ENCOUNTER — Other Ambulatory Visit (HOSPITAL_COMMUNITY): Payer: Self-pay

## 2024-09-03 DIAGNOSIS — M5416 Radiculopathy, lumbar region: Secondary | ICD-10-CM

## 2024-09-03 MED ORDER — IOPAMIDOL (ISOVUE-M 200) INJECTION 41%
1.0000 mL | Freq: Once | INTRAMUSCULAR | Status: AC
  Administered 2024-09-03: 10:00:00 1 mL via EPIDURAL

## 2024-09-03 MED ORDER — METHYLPREDNISOLONE ACETATE 40 MG/ML INJ SUSP (RADIOLOG
80.0000 mg | Freq: Once | INTRAMUSCULAR | Status: AC
  Administered 2024-09-03: 80 mg via EPIDURAL

## 2024-09-21 ENCOUNTER — Encounter (HOSPITAL_COMMUNITY): Payer: Self-pay | Admitting: Internal Medicine

## 2024-09-21 ENCOUNTER — Other Ambulatory Visit: Payer: Self-pay

## 2024-09-21 ENCOUNTER — Other Ambulatory Visit (HOSPITAL_COMMUNITY): Payer: Self-pay

## 2024-09-21 NOTE — Telephone Encounter (Signed)
 Received message from specialty pharmacy that pt's copay went up to $170.33 for her first refill. Pt enrolled in Pulmonary Fibrosis grant through PA[employer]:  Amount: $5000 Award Period: 03/25/24 - 09/21/25 BIN: [id number] PCN: [id number] Group: [id number] ID: [id number]  For pharmacy inquiries, contact PDMI at [phone]. For patient inquiries, contact PA[employer] at [phone].

## 2024-09-24 ENCOUNTER — Other Ambulatory Visit (HOSPITAL_COMMUNITY): Payer: Self-pay

## 2024-09-24 ENCOUNTER — Other Ambulatory Visit: Payer: Self-pay

## 2024-09-24 NOTE — Progress Notes (Signed)
 Specialty Pharmacy Refill Coordination Note  [name] is a 84 y.o. female contacted today regarding refills of specialty medication(s) Pirfenidone    Patient requested Delivery   Delivery date: 09/30/24   Verified address: [address], [place], [place] [address]   Medication will be filled on: 09/29/24

## 2024-09-24 NOTE — Progress Notes (Signed)
 [name] was counseled on 08/26/24 regarding new start pirfenidone . Please see 08/26/24 note for details.

## 2024-09-29 ENCOUNTER — Other Ambulatory Visit: Payer: Self-pay

## 2024-09-30 ENCOUNTER — Other Ambulatory Visit: Payer: Self-pay

## 2024-09-30 ENCOUNTER — Other Ambulatory Visit (HOSPITAL_BASED_OUTPATIENT_CLINIC_OR_DEPARTMENT_OTHER): Payer: Self-pay

## 2024-09-30 DIAGNOSIS — Z5181 Encounter for therapeutic drug level monitoring: Secondary | ICD-10-CM

## 2024-09-30 LAB — HEPATIC FUNCTION PANEL
ALT: 32 IU/L (ref 0–32)
AST: 23 IU/L (ref 0–40)
Albumin: 4.2 g/dL (ref 3.7–4.7)
Alkaline Phosphatase: 76 IU/L (ref 48–129)
Bilirubin Total: 0.3 mg/dL (ref 0.0–1.2)
Bilirubin, Direct: 0.12 mg/dL (ref 0.00–0.40)
Total Protein: 6.6 g/dL (ref 6.0–8.5)

## 2024-10-01 ENCOUNTER — Ambulatory Visit: Payer: Self-pay | Admitting: Pulmonary Disease

## 2024-10-22 ENCOUNTER — Other Ambulatory Visit: Payer: Self-pay

## 2024-10-22 ENCOUNTER — Telehealth: Payer: Self-pay

## 2024-10-22 NOTE — Progress Notes (Signed)
 Specialty Pharmacy Refill Coordination Note  [name] is a 84 y.o. female contacted today regarding refills of specialty medication(s) Pirfenidone    Patient requested Delivery   Delivery date: 10/29/24   Verified address: [id number],[place], [place] [address]   Medication will be filled on: 10/28/24

## 2024-10-22 NOTE — Telephone Encounter (Signed)
 Patient LVM complaining of diarrhea and abdominal cramping on pirfenidone , wondering what she can take.   ATC patient, LVM. Left HIPAA compliant VM but unable to give detailed instructions over voicemail. Advised to go to ER for severe, emergency symptoms. Otherwise OTC medications can be used for symptom management. For specific recommendations, please call [phone].

## 2024-10-23 ENCOUNTER — Telehealth (HOSPITAL_COMMUNITY): Payer: Self-pay | Admitting: Pulmonary Disease
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
## 2024-10-25 ENCOUNTER — Ambulatory Visit: Payer: Self-pay | Admitting: Pulmonary Disease

## 2024-10-25 NOTE — Telephone Encounter (Signed)
 FYI for pt upcoming visit with you on 11/01/2024

## 2024-10-28 ENCOUNTER — Other Ambulatory Visit: Payer: Self-pay

## 2024-11-01 ENCOUNTER — Ambulatory Visit (HOSPITAL_BASED_OUTPATIENT_CLINIC_OR_DEPARTMENT_OTHER): Admitting: Pulmonary Disease

## 2024-11-17 ENCOUNTER — Ambulatory Visit: Admitting: Orthopedic Surgery

## 2024-11-23 ENCOUNTER — Ambulatory Visit: Admitting: Internal Medicine

## 2025-01-04 ENCOUNTER — Encounter (HOSPITAL_COMMUNITY)
